# Patient Record
Sex: Female | Born: 1955
Health system: Southern US, Community
[De-identification: ages and names within clinical notes are randomized; demographics above are authoritative.]

## PROBLEM LIST (undated history)

## (undated) DIAGNOSIS — I219 Acute myocardial infarction, unspecified: Secondary | ICD-10-CM

## (undated) DIAGNOSIS — F419 Anxiety disorder, unspecified: Secondary | ICD-10-CM

## (undated) DIAGNOSIS — K219 Gastro-esophageal reflux disease without esophagitis: Secondary | ICD-10-CM

## (undated) DIAGNOSIS — J189 Pneumonia, unspecified organism: Secondary | ICD-10-CM

## (undated) DIAGNOSIS — K760 Fatty (change of) liver, not elsewhere classified: Secondary | ICD-10-CM

## (undated) DIAGNOSIS — E039 Hypothyroidism, unspecified: Secondary | ICD-10-CM

## (undated) DIAGNOSIS — I2510 Atherosclerotic heart disease of native coronary artery without angina pectoris: Secondary | ICD-10-CM

## (undated) DIAGNOSIS — G4730 Sleep apnea, unspecified: Secondary | ICD-10-CM

## (undated) DIAGNOSIS — G8928 Other chronic postprocedural pain: Secondary | ICD-10-CM

## (undated) DIAGNOSIS — R0600 Dyspnea, unspecified: Secondary | ICD-10-CM

## (undated) DIAGNOSIS — I10 Essential (primary) hypertension: Secondary | ICD-10-CM

## (undated) DIAGNOSIS — M1990 Unspecified osteoarthritis, unspecified site: Secondary | ICD-10-CM

## (undated) DIAGNOSIS — I639 Cerebral infarction, unspecified: Secondary | ICD-10-CM

## (undated) DIAGNOSIS — J45909 Unspecified asthma, uncomplicated: Secondary | ICD-10-CM

## (undated) DIAGNOSIS — Z87442 Personal history of urinary calculi: Secondary | ICD-10-CM

## (undated) DIAGNOSIS — I6529 Occlusion and stenosis of unspecified carotid artery: Secondary | ICD-10-CM

## (undated) DIAGNOSIS — I739 Peripheral vascular disease, unspecified: Secondary | ICD-10-CM

## (undated) DIAGNOSIS — R51 Headache: Secondary | ICD-10-CM

## (undated) DIAGNOSIS — R0602 Shortness of breath: Secondary | ICD-10-CM

## (undated) DIAGNOSIS — E785 Hyperlipidemia, unspecified: Secondary | ICD-10-CM

## (undated) DIAGNOSIS — R9431 Abnormal electrocardiogram [ECG] [EKG]: Secondary | ICD-10-CM

## (undated) DIAGNOSIS — C3490 Malignant neoplasm of unspecified part of unspecified bronchus or lung: Secondary | ICD-10-CM

## (undated) HISTORY — DX: Fatty (change of) liver, not elsewhere classified: K76.0

## (undated) HISTORY — PX: TUBAL LIGATION: SHX77

## (undated) HISTORY — DX: Hyperlipidemia, unspecified: E78.5

## (undated) HISTORY — PX: ABDOMINAL SURGERY: SHX537

## (undated) HISTORY — PX: FRACTURE SURGERY: SHX138

## (undated) HISTORY — DX: Gastro-esophageal reflux disease without esophagitis: K21.9

## (undated) HISTORY — PX: TONSILLECTOMY: SUR1361

## (undated) HISTORY — DX: Cerebral infarction, unspecified: I63.9

## (undated) HISTORY — PX: OTHER SURGICAL HISTORY: SHX169

## (undated) HISTORY — DX: Abnormal electrocardiogram (ECG) (EKG): R94.31

## (undated) HISTORY — DX: Unspecified asthma, uncomplicated: J45.909

## (undated) HISTORY — DX: Peripheral vascular disease, unspecified: I73.9

## (undated) HISTORY — PX: KNEE ARTHROSCOPY: SUR90

## (undated) HISTORY — DX: Occlusion and stenosis of unspecified carotid artery: I65.29

## (undated) HISTORY — PX: APPENDECTOMY: SHX54

## (undated) HISTORY — DX: Atherosclerotic heart disease of native coronary artery without angina pectoris: I25.10

---

## 1997-02-05 HISTORY — PX: ABDOMINAL HYSTERECTOMY: SHX81

## 1998-02-05 HISTORY — PX: ABDOMINAL HYSTERECTOMY: SHX81

## 2002-09-02 ENCOUNTER — Emergency Department (HOSPITAL_COMMUNITY): Admission: EM | Admit: 2002-09-02 | Discharge: 2002-09-02 | Payer: Self-pay

## 2002-12-07 ENCOUNTER — Inpatient Hospital Stay (HOSPITAL_COMMUNITY): Admission: EM | Admit: 2002-12-07 | Discharge: 2002-12-12 | Payer: Self-pay | Admitting: Emergency Medicine

## 2002-12-14 ENCOUNTER — Encounter: Admission: RE | Admit: 2002-12-14 | Discharge: 2002-12-14 | Payer: Self-pay | Admitting: Internal Medicine

## 2002-12-17 ENCOUNTER — Encounter: Admission: RE | Admit: 2002-12-17 | Discharge: 2003-03-17 | Payer: Self-pay | Admitting: Internal Medicine

## 2002-12-24 ENCOUNTER — Ambulatory Visit (HOSPITAL_COMMUNITY): Admission: RE | Admit: 2002-12-24 | Discharge: 2002-12-24 | Payer: Self-pay | Admitting: Orthopedic Surgery

## 2002-12-24 ENCOUNTER — Ambulatory Visit (HOSPITAL_BASED_OUTPATIENT_CLINIC_OR_DEPARTMENT_OTHER): Admission: RE | Admit: 2002-12-24 | Discharge: 2002-12-24 | Payer: Self-pay | Admitting: Orthopedic Surgery

## 2002-12-30 ENCOUNTER — Ambulatory Visit (HOSPITAL_COMMUNITY): Admission: RE | Admit: 2002-12-30 | Discharge: 2002-12-30 | Payer: Self-pay | Admitting: *Deleted

## 2003-05-26 ENCOUNTER — Emergency Department (HOSPITAL_COMMUNITY): Admission: EM | Admit: 2003-05-26 | Discharge: 2003-05-27 | Payer: Self-pay | Admitting: Emergency Medicine

## 2003-06-05 ENCOUNTER — Emergency Department (HOSPITAL_COMMUNITY): Admission: EM | Admit: 2003-06-05 | Discharge: 2003-06-05 | Payer: Self-pay | Admitting: *Deleted

## 2003-10-30 ENCOUNTER — Emergency Department (HOSPITAL_COMMUNITY): Admission: EM | Admit: 2003-10-30 | Discharge: 2003-10-30 | Payer: Self-pay | Admitting: Emergency Medicine

## 2003-12-15 ENCOUNTER — Emergency Department (HOSPITAL_COMMUNITY): Admission: EM | Admit: 2003-12-15 | Discharge: 2003-12-16 | Payer: Self-pay | Admitting: Emergency Medicine

## 2004-01-03 ENCOUNTER — Ambulatory Visit: Payer: Self-pay | Admitting: Physical Medicine & Rehabilitation

## 2004-01-03 ENCOUNTER — Inpatient Hospital Stay (HOSPITAL_COMMUNITY): Admission: AC | Admit: 2004-01-03 | Discharge: 2004-01-10 | Payer: Self-pay

## 2004-03-23 ENCOUNTER — Encounter: Admission: RE | Admit: 2004-03-23 | Discharge: 2004-03-23 | Payer: Self-pay | Admitting: Family Medicine

## 2004-06-21 ENCOUNTER — Ambulatory Visit (HOSPITAL_COMMUNITY): Admission: RE | Admit: 2004-06-21 | Discharge: 2004-06-21 | Payer: Self-pay | Admitting: Internal Medicine

## 2004-08-22 ENCOUNTER — Encounter: Admission: RE | Admit: 2004-08-22 | Discharge: 2004-08-22 | Payer: Self-pay | Admitting: Internal Medicine

## 2004-11-06 ENCOUNTER — Ambulatory Visit (HOSPITAL_BASED_OUTPATIENT_CLINIC_OR_DEPARTMENT_OTHER): Admission: RE | Admit: 2004-11-06 | Discharge: 2004-11-06 | Payer: Self-pay | Admitting: Ophthalmology

## 2004-11-06 ENCOUNTER — Encounter (INDEPENDENT_AMBULATORY_CARE_PROVIDER_SITE_OTHER): Payer: Self-pay | Admitting: Specialist

## 2005-04-24 ENCOUNTER — Encounter: Admission: RE | Admit: 2005-04-24 | Discharge: 2005-04-24 | Payer: Self-pay | Admitting: Internal Medicine

## 2005-10-02 ENCOUNTER — Emergency Department (HOSPITAL_COMMUNITY): Admission: EM | Admit: 2005-10-02 | Discharge: 2005-10-02 | Payer: Self-pay | Admitting: Family Medicine

## 2005-12-01 ENCOUNTER — Emergency Department (HOSPITAL_COMMUNITY): Admission: EM | Admit: 2005-12-01 | Discharge: 2005-12-01 | Payer: Self-pay | Admitting: Family Medicine

## 2006-02-05 DIAGNOSIS — G8928 Other chronic postprocedural pain: Secondary | ICD-10-CM

## 2006-02-05 HISTORY — DX: Other chronic postprocedural pain: G89.28

## 2006-02-05 HISTORY — PX: HERNIA REPAIR: SHX51

## 2006-03-27 ENCOUNTER — Encounter: Admission: RE | Admit: 2006-03-27 | Discharge: 2006-03-27 | Payer: Self-pay | Admitting: Internal Medicine

## 2007-06-02 ENCOUNTER — Ambulatory Visit (HOSPITAL_COMMUNITY): Admission: RE | Admit: 2007-06-02 | Discharge: 2007-06-02 | Payer: Self-pay | Admitting: Gastroenterology

## 2007-06-25 ENCOUNTER — Encounter: Admission: RE | Admit: 2007-06-25 | Discharge: 2007-06-25 | Payer: Self-pay | Admitting: Internal Medicine

## 2008-01-13 ENCOUNTER — Encounter: Admission: RE | Admit: 2008-01-13 | Discharge: 2008-02-05 | Payer: Self-pay | Admitting: Internal Medicine

## 2008-02-03 ENCOUNTER — Inpatient Hospital Stay (HOSPITAL_COMMUNITY): Admission: EM | Admit: 2008-02-03 | Discharge: 2008-02-10 | Payer: Self-pay | Admitting: Emergency Medicine

## 2008-04-23 ENCOUNTER — Encounter: Admission: RE | Admit: 2008-04-23 | Discharge: 2008-04-23 | Payer: Self-pay | Admitting: Surgery

## 2008-06-18 ENCOUNTER — Emergency Department (HOSPITAL_COMMUNITY): Admission: EM | Admit: 2008-06-18 | Discharge: 2008-06-18 | Payer: Self-pay | Admitting: Family Medicine

## 2009-02-19 ENCOUNTER — Emergency Department (HOSPITAL_COMMUNITY): Admission: EM | Admit: 2009-02-19 | Discharge: 2009-02-20 | Payer: Self-pay | Admitting: Emergency Medicine

## 2009-03-30 ENCOUNTER — Encounter: Admission: RE | Admit: 2009-03-30 | Discharge: 2009-03-30 | Payer: Self-pay | Admitting: Internal Medicine

## 2009-04-03 ENCOUNTER — Inpatient Hospital Stay (HOSPITAL_COMMUNITY): Admission: EM | Admit: 2009-04-03 | Discharge: 2009-04-06 | Payer: Self-pay | Admitting: Emergency Medicine

## 2009-09-07 ENCOUNTER — Encounter: Admission: RE | Admit: 2009-09-07 | Discharge: 2009-09-07 | Payer: Self-pay | Admitting: Orthopedic Surgery

## 2009-10-27 ENCOUNTER — Emergency Department (HOSPITAL_COMMUNITY): Admission: EM | Admit: 2009-10-27 | Discharge: 2009-10-28 | Payer: Self-pay | Admitting: Emergency Medicine

## 2009-11-07 ENCOUNTER — Emergency Department (HOSPITAL_COMMUNITY): Admission: EM | Admit: 2009-11-07 | Discharge: 2009-11-07 | Payer: Self-pay | Admitting: Emergency Medicine

## 2010-01-03 ENCOUNTER — Encounter: Admission: RE | Admit: 2010-01-03 | Discharge: 2010-01-03 | Payer: Self-pay | Admitting: Family Medicine

## 2010-02-26 ENCOUNTER — Encounter: Payer: Self-pay | Admitting: Internal Medicine

## 2010-03-18 ENCOUNTER — Encounter (HOSPITAL_COMMUNITY): Payer: Self-pay | Admitting: Radiology

## 2010-03-18 ENCOUNTER — Inpatient Hospital Stay (HOSPITAL_COMMUNITY)
Admission: EM | Admit: 2010-03-18 | Discharge: 2010-03-24 | DRG: 390 | Disposition: A | Discharge status: Home / Self Care | Payer: Managed Care, Other (non HMO) | Attending: Internal Medicine | Admitting: Internal Medicine

## 2010-03-18 ENCOUNTER — Emergency Department (HOSPITAL_COMMUNITY): Payer: Managed Care, Other (non HMO)

## 2010-03-18 DIAGNOSIS — E785 Hyperlipidemia, unspecified: Secondary | ICD-10-CM | POA: Diagnosis present

## 2010-03-18 DIAGNOSIS — I10 Essential (primary) hypertension: Secondary | ICD-10-CM | POA: Diagnosis present

## 2010-03-18 DIAGNOSIS — Z951 Presence of aortocoronary bypass graft: Secondary | ICD-10-CM

## 2010-03-18 DIAGNOSIS — Z79899 Other long term (current) drug therapy: Secondary | ICD-10-CM

## 2010-03-18 DIAGNOSIS — E119 Type 2 diabetes mellitus without complications: Secondary | ICD-10-CM | POA: Diagnosis present

## 2010-03-18 DIAGNOSIS — K56609 Unspecified intestinal obstruction, unspecified as to partial versus complete obstruction: Principal | ICD-10-CM | POA: Diagnosis present

## 2010-03-18 DIAGNOSIS — E039 Hypothyroidism, unspecified: Secondary | ICD-10-CM | POA: Diagnosis present

## 2010-03-18 DIAGNOSIS — I2510 Atherosclerotic heart disease of native coronary artery without angina pectoris: Secondary | ICD-10-CM | POA: Diagnosis present

## 2010-03-18 HISTORY — DX: Essential (primary) hypertension: I10

## 2010-03-18 LAB — CBC
Hemoglobin: 12.9 g/dL (ref 12.0–15.0)
MCHC: 33.6 g/dL (ref 30.0–36.0)
RDW: 13.7 % (ref 11.5–15.5)
WBC: 9.5 10*3/uL (ref 4.0–10.5)

## 2010-03-18 LAB — DIFFERENTIAL
Basophils Absolute: 0 10*3/uL (ref 0.0–0.1)
Basophils Relative: 0 % (ref 0–1)
Lymphocytes Relative: 26 % (ref 12–46)
Monocytes Absolute: 0.6 10*3/uL (ref 0.1–1.0)
Neutro Abs: 6.3 10*3/uL (ref 1.7–7.7)

## 2010-03-18 LAB — COMPREHENSIVE METABOLIC PANEL
ALT: 28 U/L (ref 0–35)
AST: 31 U/L (ref 0–37)
Alkaline Phosphatase: 86 U/L (ref 39–117)
CO2: 23 mEq/L (ref 19–32)
Calcium: 9.6 mg/dL (ref 8.4–10.5)
GFR calc Af Amer: 51 mL/min — ABNORMAL LOW (ref >60)
Potassium: 4 mEq/L (ref 3.5–5.1)
Sodium: 137 mEq/L (ref 135–145)
Total Protein: 7.6 g/dL (ref 6.0–8.3)

## 2010-03-18 LAB — URINALYSIS, ROUTINE W REFLEX MICROSCOPIC
Bilirubin Urine: NEGATIVE
Hgb urine dipstick: NEGATIVE
Specific Gravity, Urine: 1.035 — ABNORMAL HIGH (ref 1.005–1.030)
pH: 5.5 (ref 5.0–8.0)

## 2010-03-18 LAB — LACTIC ACID, PLASMA: Lactic Acid, Venous: 0.8 mmol/L (ref 0.5–2.2)

## 2010-03-18 LAB — URIC ACID: Uric Acid, Serum: 8.4 mg/dL — ABNORMAL HIGH (ref 2.4–7.0)

## 2010-03-18 LAB — URINE MICROSCOPIC-ADD ON

## 2010-03-18 LAB — GLUCOSE, CAPILLARY: Glucose-Capillary: 193 mg/dL — ABNORMAL HIGH (ref 70–99)

## 2010-03-18 MED ORDER — IOHEXOL 300 MG/ML  SOLN
120.0000 mL | Freq: Once | INTRAMUSCULAR | Status: AC | PRN
  Administered 2010-03-18: 120 mL via INTRAVENOUS

## 2010-03-19 ENCOUNTER — Inpatient Hospital Stay (HOSPITAL_COMMUNITY): Payer: Managed Care, Other (non HMO)

## 2010-03-19 LAB — CBC
HCT: 38.7 % (ref 36.0–46.0)
Hemoglobin: 12.9 g/dL (ref 12.0–15.0)
MCV: 80.8 fL (ref 78.0–100.0)
Platelets: 236 10*3/uL (ref 150–400)
RBC: 4.79 MIL/uL (ref 3.87–5.11)
WBC: 5.2 10*3/uL (ref 4.0–10.5)

## 2010-03-19 LAB — BASIC METABOLIC PANEL
BUN: 10 mg/dL (ref 6–23)
Chloride: 105 mEq/L (ref 96–112)
Potassium: 3.7 mEq/L (ref 3.5–5.1)

## 2010-03-19 LAB — GLUCOSE, CAPILLARY
Glucose-Capillary: 119 mg/dL — ABNORMAL HIGH (ref 70–99)
Glucose-Capillary: 128 mg/dL — ABNORMAL HIGH (ref 70–99)
Glucose-Capillary: 147 mg/dL — ABNORMAL HIGH (ref 70–99)

## 2010-03-19 LAB — DIFFERENTIAL
Eosinophils Absolute: 0.1 10*3/uL (ref 0.0–0.7)
Lymphocytes Relative: 40 % (ref 12–46)
Lymphs Abs: 2.1 10*3/uL (ref 0.7–4.0)
Neutro Abs: 2.7 10*3/uL (ref 1.7–7.7)
Neutrophils Relative %: 52 % (ref 43–77)

## 2010-03-19 LAB — MAGNESIUM: Magnesium: 2.4 mg/dL (ref 1.5–2.5)

## 2010-03-20 LAB — GLUCOSE, CAPILLARY: Glucose-Capillary: 96 mg/dL (ref 70–99)

## 2010-03-20 LAB — CBC
HCT: 37.4 % (ref 36.0–46.0)
Hemoglobin: 12.8 g/dL (ref 12.0–15.0)
MCH: 27.7 pg (ref 26.0–34.0)
MCHC: 34.2 g/dL (ref 30.0–36.0)
MCV: 81 fL (ref 78.0–100.0)
RBC: 4.62 MIL/uL (ref 3.87–5.11)

## 2010-03-20 NOTE — Consult Note (Signed)
NAMEJACQUELYNNE, [name]                ACCOUNT NO.:  [id number]  MEDICAL RECORD NO.:  [id number]           PATIENT TYPE:  I  LOCATION:  5118                         FACILITY:  [facility]  PHYSICIAN:  Juanetta Gosling, MDDATE OF BIRTH:  [birth year]  DATE OF CONSULTATION:  03/18/2010 DATE OF DISCHARGE:                                CONSULTATION   CHIEF COMPLAINT:  Abdominal pain.  HISTORY OF PRESENT ILLNESS:  This is a 55 year old female who has had a history of small bowel obstructions and began having crampy intermittent abdominal pain that was diffuse last night.  She actually had some gas and flatus and bowel movement yesterday.  Then early this morning, this pain began to be diffuse and constant associated with significant nausea and an episode of emesis, prompting her to come to the emergency room. She denies any flatus or bowel movement since early this morning and she states her abdomen does feel bloated. She denies any fevers.  There is no real relieving factors or aggravating factors currently.  She does state she does have a history of a colonoscopy in the recent past which she states it was normal.  PAST MEDICAL HISTORY: 1. Diabetes. 2. Hypertension. 3. Hypercholesterolemia. 4. History of small bowel obstruction. 5. History of cardiac catheterization.  PAST SURGICAL HISTORY:  Two C-sections in 1999.  She reports she had a hysterectomy in 2000.  It sounds like she had a BSO.  She also has a laparoscopic ventral hernia repair for an incarcerated hernia by Dr. Estelle Grumbles in 2009 with bright Parietex mesh.  MEDICATIONS:  Crestor, lisinopril, atenolol and metformin.  ALLERGIES:  PENICILLIN and MORPHINE.  SOCIAL HISTORY:  She is a nonsmoker, does not drink alcohol.  She works as a Psychologist, occupational, Engineer, site.  PHYSICAL EXAMINATION:  VITAL SIGNS:  98.5, 162/78, 79 and 16. GENERAL:  She is a well-appearing female who was in some moderate distress due to abdominal  pain. NECK:  Supple without adenopathy. HEART:  Regular rate and rhythm. LUNGS:  Clear bilaterally. ABDOMEN:  Diffuse mild tenderness and no peritoneal signs.  She has no bowel sounds really that are present and some well-healed scars.  She has no inguinal hernias present.  Laboratory evaluation shows a white blood cell count 9.5, hematocrit 38.4, platelets 249, BUN 33, creatinine 1.31, lipase 35.  Liver function tests are within normal limits.  Urinalysis shows some small leuks, but otherwise negative.  Her CT scan shows her to have long segment of abnormal ileum, therefore, some mild wall thickening and mid bowel dilatation with small bowel feces sign, bladder gastric distention.  ASSESSMENT:  Small bowel obstruction.  PLAN:  This is most likely adhesive in nature, although I am little bit concerned about her exam and some of the findings on her CT scan.  She does not really have any risk factors for ischemic bowel.  She has had bowel obstructions in the past.  I think this is the most likely etiology.  She does not currently meet any criteria to go to the operating room.  She is mildly tender throughout her abdomen and she is not tachycardic.  Her white count is normal right now.  I think it would be reasonable.  I discussed with the patient a course of conservative therapy to see if we can get her better to avoid an operation.  She and I discussed the G-tube n.p.o. and monitoring.  I did discuss with her if she does not get better or worsens, then she is going to need to go to the operating room.     Juanetta Gosling, MD     MCW/MEDQ  D:  03/18/2010  T:  03/19/2010  Job:  [id number]  Electronically Signed by Emelia Loron MD on 03/20/2010 07:52:16 PM

## 2010-03-21 LAB — GLUCOSE, CAPILLARY
Glucose-Capillary: 118 mg/dL — ABNORMAL HIGH (ref 70–99)
Glucose-Capillary: 151 mg/dL — ABNORMAL HIGH (ref 70–99)
Glucose-Capillary: 92 mg/dL (ref 70–99)
Glucose-Capillary: 92 mg/dL (ref 70–99)

## 2010-03-21 LAB — BASIC METABOLIC PANEL
CO2: 23 mEq/L (ref 19–32)
Calcium: 9.1 mg/dL (ref 8.4–10.5)
Glucose, Bld: 85 mg/dL (ref 70–99)
Sodium: 140 mEq/L (ref 135–145)

## 2010-03-21 NOTE — H&P (Signed)
NAMELATORSHA, CURLING                ACCOUNT NO.:  [id number]  MEDICAL RECORD NO.:  [id number]           PATIENT TYPE:  I  LOCATION:  5118                         FACILITY:  [facility]  PHYSICIAN:  Tarry Kos, MD       DATE OF BIRTH:  07-18-1955  DATE OF ADMISSION:  03/18/2010 DATE OF DISCHARGE:                             HISTORY & PHYSICAL   CHIEF COMPLAINT:  Nausea, vomiting, and abdominal pain.  HISTORY OF PRESENT ILLNESS:  Ms. Dirusso is a 55 year old female with a history of diabetes, hypertension, previous bowel obstructions, and status post lysis of adhesions in March 2011 who presents to the emergency room after suddenly having acute onset of nausea, vomiting, and abdominal pain yesterday.  She says she was washing clothes at which point she all of a sudden started having nausea and vomiting last night and diffuse generalized abdominal pain.  It is persisted and she came to the emergency room early in the morning and was found to have small bowel obstruction.  This has happened to her once before and she actually has had lysis of adhesions in the beginning of 2011, and she has had a ventral hernia repair in 2009.  She has been admitted for partial bowel obstruction.  She denies any fevers.  She currently is comfortable in the emergency room.  She has not had any vomiting since about 6 hours ago.  She said she did have a bowel movement last night. Her of vomit is greenish bilious in nature.  There is no blood.  She denies any dysuria or fevers.  PAST MEDICAL HISTORY: 1. Hypertension. 2. Diabetes. 3. History of coronary artery disease, status post stenting in 2000. 4. Previous history of bowel obstruction. 5. Status post hysterectomy. 6. Status post lysis of adhesions. 7. Status post ventral hernia repair.  SOCIAL HISTORY:  Does not smoke, does not do any alcohol or any other drug use.  FAMILY HISTORY:  Noncontributory.  ALLERGIES:  PENICILLIN causes  swelling.  MEDICATIONS: 1. Lisinopril 40 mg a day. 2. Doxepin 6 mg nightly. 3. Sertraline 50 mg daily. 4. Ambien 10 mg nightly. 5. Atenolol and chlorthalidone 100/25 daily. 6. Metformin 500 mg twice a day. 7. Crestor 20 mg a day. 8. Byetta 10 mcg injection twice a day. 9. Tramadol 50 mg every 6 hours as needed for pain. 10.Levothyroxine 50 mcg daily.  PHYSICAL EXAMINATION:  VITAL SIGNS:  Afebrile.  Vital signs are stable. GENERAL:  Alert and oriented x4, no apparent distress, cooperates fairly. HEART:  Regular rate and rhythm without murmurs or gallops. CHEST:  Clear to auscultation bilaterally.  No wheezes or rhonchi heard. ABDOMEN:  Soft.  It is tender diffusely to palpation.  Nondistended. Positive bowel sounds.  No rebound, no guarding, nonacute abdomen. EXTREMITIES:  No clubbing, cyanosis, or edema. PSYCH:  Normal affect. NEURO:  No focal neurologic deficits.  LABORATORY DATA:  Her white count is normal.  Hemoglobin is normal.  Her BUN and creatinine is 33 and 1.3.  Electrolytes are normal.  LFTs normal.  Alk phos normal.  Lipase normal.  Urinalysis:  Small amount of leukocytes, many  bacteria, 7-10 white blood cells.  Lactic acid level is 0.8.  CT of her abdomen and pelvis consistent with small bowel dilation. Terminal ileum with circumstantial mild wall thickening, which may represent changes of inflammatory bowel disease, infection, or ischemia. Her acute abdominal series was negative.  ASSESSMENT AND PLAN:  This is a 55 year old female with: 1. Partial small bowel obstruction.  We will try conservative     management at this point.  Place NG-tube if she has any further     vomiting.  Surgery has been consulted.  She is a nonsurgical     abdomen at this point.  Monitor closely.  Provide Zofran and pain     medications as needed. 2. Diabetes.  Cover with sliding scale insulin while she is here. 3. Possible urinary tract infection.  Cover with Rocephin.  Urine      culture is pending. 4. The patient is full code.  Further recommendations pending over     hospital course.          ______________________________ Tarry Kos, MD     RD/MEDQ  D:  03/18/2010  T:  03/19/2010  Job:  [id number]  Electronically Signed by Eldridge Dace MD on 03/21/2010 01:37:17 PM

## 2010-03-22 LAB — CBC
HCT: 35.9 % — ABNORMAL LOW (ref 36.0–46.0)
Hemoglobin: 12.4 g/dL (ref 12.0–15.0)
MCH: 27.6 pg (ref 26.0–34.0)
MCHC: 34.5 g/dL (ref 30.0–36.0)

## 2010-03-22 LAB — BASIC METABOLIC PANEL
BUN: 5 mg/dL — ABNORMAL LOW (ref 6–23)
CO2: 23 mEq/L (ref 19–32)
Calcium: 8.8 mg/dL (ref 8.4–10.5)
Creatinine, Ser: 0.72 mg/dL (ref 0.4–1.2)
Glucose, Bld: 147 mg/dL — ABNORMAL HIGH (ref 70–99)

## 2010-03-22 LAB — GLUCOSE, CAPILLARY

## 2010-03-23 LAB — GLUCOSE, CAPILLARY
Glucose-Capillary: 115 mg/dL — ABNORMAL HIGH (ref 70–99)
Glucose-Capillary: 149 mg/dL — ABNORMAL HIGH (ref 70–99)
Glucose-Capillary: 157 mg/dL — ABNORMAL HIGH (ref 70–99)
Glucose-Capillary: 152 mg/dL — ABNORMAL HIGH (ref 70–99)

## 2010-03-23 LAB — MAGNESIUM: Magnesium: 2 mg/dL (ref 1.5–2.5)

## 2010-03-23 LAB — BASIC METABOLIC PANEL
Calcium: 8.4 mg/dL (ref 8.4–10.5)
Creatinine, Ser: 0.79 mg/dL (ref 0.4–1.2)
GFR calc Af Amer: >60 mL/min (ref >60)

## 2010-03-24 LAB — GLUCOSE, CAPILLARY: Glucose-Capillary: 152 mg/dL — ABNORMAL HIGH (ref 70–99)

## 2010-04-04 NOTE — Discharge Summary (Signed)
[name], [name]                ACCOUNT NO.:  [id number]  MEDICAL RECORD NO.:  [id number]           PATIENT TYPE:  LOCATION:                                 FACILITY:  PHYSICIAN:  Erick Blinks, MD     DATE OF BIRTH:  20-Oct-1955  DATE OF ADMISSION: DATE OF DISCHARGE:                              DISCHARGE SUMMARY   PRIMARY CARE PHYSICIAN:  Emeterio Reeve, MD.  DISCHARGE DIAGNOSES: 1. Small-bowel obstruction, resolved. 2. Chronic abdominal pain. 3. Type 2 diabetes. 4. Hypertension. 5. Obesity. 6. Hypothyroidism. 7. Hyperlipidemia. 8. History of coronary artery disease.  DISCHARGE MEDICATIONS: 1. Atenolol 100 mg p.o. daily. 2. MiraLax 17 grams by mouth daily. 3. Oxycodone/APAP 5/325 mg 1-2 tablets by mouth every 6 hours as     needed. 4. Ambien 10 mg 1 tablet by mouth daily at bedtime. 5. Byetta 10 mcg 1 injection subcutaneously twice daily. 6. Crestor 20 mg 1 tablet by mouth daily. 7. Doxepin 6 mg 1 capsule by mouth daily at bedtime. 8. Levothyroxine 50 mcg 1 tablet by mouth daily. 9. Lisinopril 40 mg 1 tablet by mouth daily. 10.Metformin 500 mg 1 tablet by mouth twice daily. 11.Sertraline 50 mg 1 tablet by mouth daily. 12.Tramadol 50 mg 1 tablet by mouth every 6 hours needed.  MEDICATIONS STOPPED IN THE HOSPITAL:  Atenolol/chlorthalidone.  ADMISSION HISTORY:  This is a 55 year old female with history of diabetes, hypertension, and previous bowel obstructions, who presents to the emergency room with complaints of acute onset of nausea, vomiting, and abdominal pain.  The patient was washing clothes when she started to have sudden onset of nausea and vomiting and diffuse generalized abdominal pain.  In the ER, she was found to have small bowel obstruction, was subsequently admitted for further evaluation.  For further details, please refer to the history and physical dictated by Dr. Onalee Hua on March 19, 2010.  HOSPITAL COURSE: 1. Small bowel obstruction.  The  patient was followed in consultation     by [facility], initially placed with an NG tube.  The     patient was treated with conservative measures.  Her progress was     slow, but she has improved.  Her bowel obstruction has since     resolved.  She is tolerating her p.o. diet.  Has not had any nausea     and vomiting, and is having bowel movements.  The patient is     ambulating.  She does have chronic abdominal pain, but this is     unchanged for the past few years.  She has seen Dr. Elnoria Howard in the     past and has been advised to follow up with him for further     management. 2. Hypertension.  She is continued on her outpatient medication. 3. Diabetes.  The patient was continued on her outpatient medications     and it can be further managed by the primary care physician.  DIAGNOSTIC IMAGING: 1. Abdominal x-ray on February 11 shows no evidence of acute     abnormality, unremarkable bowel gas pattern.  No evidence of acute  cardiopulmonary disease. 2. CT abdomen and pelvis on February 11 shows long segment of abnormal    ileum included the terminal ileum with circumferential mild wall     thickening, which may represent changes of the inflammatory bowel     disease, infection, or ischemia.  Segmental mid small bowel     dilatation with small-bowel feces sign is an indicator of     obstruction at the level of an anastomotic staple line.  Moderate     gastric distention and unremarkable colon. 3. X-ray of the abdomen on February 12 showed air-fluid levels and     bowel loops which are at the upper limits of normal size, no intra-     abdominal free air. Continued to follow up with plain films of the     abdomen as recommended to ensure resolution.  No acute     cardiopulmonary disease.  CONSULTATIONS:  [facility].  PROCEDURES:  None.  DISCHARGE INSTRUCTIONS:  The patient is to continue on a heart-healthy low-calorie diet, conduct her activity as  tolerated.  She will follow up with Dr. Paulino Rily next week and she should follow up with Dr. Elnoria Howard as needed in the future for continued abdominal pain.  The patient is otherwise stable for discharge.  CONDITION AT TIME OF DISCHARGE:  Improved.  TIME SPENT ON THIS DISCHARGE:  50 minutes.     Erick Blinks, MD     JM/MEDQ  D:  03/24/2010  T:  03/25/2010  Job:  [id number]  cc:   Emeterio Reeve, MD  Electronically Signed by Erick Blinks  on 04/04/2010 05:17:50 PM

## 2010-04-05 ENCOUNTER — Inpatient Hospital Stay (HOSPITAL_COMMUNITY)
Admission: EM | Admit: 2010-04-05 | Discharge: 2010-04-10 | DRG: 389 | Disposition: A | Discharge status: Home / Self Care | Payer: Managed Care, Other (non HMO) | Attending: Internal Medicine | Admitting: Internal Medicine

## 2010-04-05 ENCOUNTER — Emergency Department (HOSPITAL_COMMUNITY): Payer: Managed Care, Other (non HMO)

## 2010-04-05 DIAGNOSIS — E785 Hyperlipidemia, unspecified: Secondary | ICD-10-CM | POA: Diagnosis present

## 2010-04-05 DIAGNOSIS — R1013 Epigastric pain: Secondary | ICD-10-CM | POA: Diagnosis present

## 2010-04-05 DIAGNOSIS — K56609 Unspecified intestinal obstruction, unspecified as to partial versus complete obstruction: Principal | ICD-10-CM | POA: Diagnosis present

## 2010-04-05 DIAGNOSIS — E876 Hypokalemia: Secondary | ICD-10-CM | POA: Diagnosis present

## 2010-04-05 DIAGNOSIS — IMO0001 Reserved for inherently not codable concepts without codable children: Secondary | ICD-10-CM | POA: Diagnosis present

## 2010-04-05 DIAGNOSIS — I10 Essential (primary) hypertension: Secondary | ICD-10-CM | POA: Diagnosis present

## 2010-04-05 DIAGNOSIS — E039 Hypothyroidism, unspecified: Secondary | ICD-10-CM | POA: Diagnosis present

## 2010-04-05 DIAGNOSIS — N390 Urinary tract infection, site not specified: Secondary | ICD-10-CM | POA: Diagnosis present

## 2010-04-05 DIAGNOSIS — I2510 Atherosclerotic heart disease of native coronary artery without angina pectoris: Secondary | ICD-10-CM | POA: Diagnosis present

## 2010-04-05 DIAGNOSIS — K3189 Other diseases of stomach and duodenum: Secondary | ICD-10-CM | POA: Diagnosis present

## 2010-04-05 LAB — CBC
HCT: 39.8 % (ref 36.0–46.0)
MCHC: 34.2 g/dL (ref 30.0–36.0)
MCV: 81.9 fL (ref 78.0–100.0)
Platelets: 360 10*3/uL (ref 150–400)
RDW: 14.8 % (ref 11.5–15.5)

## 2010-04-05 LAB — COMPREHENSIVE METABOLIC PANEL
Albumin: 3.7 g/dL (ref 3.5–5.2)
BUN: 17 mg/dL (ref 6–23)
Calcium: 9.5 mg/dL (ref 8.4–10.5)
Glucose, Bld: 181 mg/dL — ABNORMAL HIGH (ref 70–99)
Total Protein: 7.4 g/dL (ref 6.0–8.3)

## 2010-04-05 LAB — DIFFERENTIAL
Basophils Absolute: 0 10*3/uL (ref 0.0–0.1)
Eosinophils Absolute: 0.2 10*3/uL (ref 0.0–0.7)
Eosinophils Relative: 2 % (ref 0–5)
Lymphocytes Relative: 42 % (ref 12–46)
Lymphs Abs: 4 10*3/uL (ref 0.7–4.0)
Monocytes Absolute: 0.5 10*3/uL (ref 0.1–1.0)

## 2010-04-05 LAB — POCT CARDIAC MARKERS
CKMB, poc: <1 ng/mL — ABNORMAL LOW (ref 1.0–8.0)
Myoglobin, poc: 45.1 ng/mL (ref 12–200)
Troponin i, poc: <0.05 ng/mL (ref 0.00–0.09)

## 2010-04-05 LAB — LIPASE, BLOOD: Lipase: 26 U/L (ref 11–59)

## 2010-04-06 LAB — URINE CULTURE
Colony Count: 80000
Culture  Setup Time: 201202290121

## 2010-04-06 LAB — URINALYSIS, ROUTINE W REFLEX MICROSCOPIC
Bilirubin Urine: NEGATIVE
Ketones, ur: NEGATIVE mg/dL
Nitrite: NEGATIVE
Protein, ur: NEGATIVE mg/dL
Urobilinogen, UA: 0.2 mg/dL (ref 0.0–1.0)

## 2010-04-06 LAB — URINE MICROSCOPIC-ADD ON

## 2010-04-06 LAB — COMPREHENSIVE METABOLIC PANEL
ALT: 20 U/L (ref 0–35)
AST: 23 U/L (ref 0–37)
CO2: 21 mEq/L (ref 19–32)
Chloride: 107 mEq/L (ref 96–112)
Creatinine, Ser: 0.76 mg/dL (ref 0.4–1.2)
GFR calc Af Amer: >60 mL/min (ref >60)
GFR calc non Af Amer: >60 mL/min (ref >60)
Total Bilirubin: 0.7 mg/dL (ref 0.3–1.2)

## 2010-04-06 LAB — GLUCOSE, CAPILLARY

## 2010-04-06 LAB — CBC
HCT: 39.4 % (ref 36.0–46.0)
Hemoglobin: 12.9 g/dL (ref 12.0–15.0)
MCH: 26.9 pg (ref 26.0–34.0)
MCHC: 32.7 g/dL (ref 30.0–36.0)
MCV: 82.1 fL (ref 78.0–100.0)

## 2010-04-06 MED ORDER — IOHEXOL 300 MG/ML  SOLN
100.0000 mL | Freq: Once | INTRAMUSCULAR | Status: AC | PRN
  Administered 2010-04-06: 100 mL via INTRAVENOUS

## 2010-04-07 ENCOUNTER — Inpatient Hospital Stay (HOSPITAL_COMMUNITY): Payer: Managed Care, Other (non HMO)

## 2010-04-07 LAB — CBC
MCH: 27.2 pg (ref 26.0–34.0)
MCHC: 33.2 g/dL (ref 30.0–36.0)
MCV: 82.1 fL (ref 78.0–100.0)
Platelets: 299 10*3/uL (ref 150–400)
RDW: 15 % (ref 11.5–15.5)

## 2010-04-07 LAB — BASIC METABOLIC PANEL
BUN: 7 mg/dL (ref 6–23)
Calcium: 8.3 mg/dL — ABNORMAL LOW (ref 8.4–10.5)
GFR calc non Af Amer: >60 mL/min (ref >60)
Glucose, Bld: 160 mg/dL — ABNORMAL HIGH (ref 70–99)

## 2010-04-07 LAB — GLUCOSE, CAPILLARY
Glucose-Capillary: 138 mg/dL — ABNORMAL HIGH (ref 70–99)
Glucose-Capillary: 219 mg/dL — ABNORMAL HIGH (ref 70–99)

## 2010-04-08 LAB — CBC
HCT: 37.8 % (ref 36.0–46.0)
Hemoglobin: 12.5 g/dL (ref 12.0–15.0)
MCH: 27.1 pg (ref 26.0–34.0)
MCHC: 33.1 g/dL (ref 30.0–36.0)

## 2010-04-08 LAB — GLUCOSE, CAPILLARY: Glucose-Capillary: 144 mg/dL — ABNORMAL HIGH (ref 70–99)

## 2010-04-08 LAB — BASIC METABOLIC PANEL
CO2: 26 mEq/L (ref 19–32)
Calcium: 8.8 mg/dL (ref 8.4–10.5)
Creatinine, Ser: 0.68 mg/dL (ref 0.4–1.2)
Glucose, Bld: 178 mg/dL — ABNORMAL HIGH (ref 70–99)

## 2010-04-09 LAB — BASIC METABOLIC PANEL
Calcium: 9.2 mg/dL (ref 8.4–10.5)
Chloride: 109 mEq/L (ref 96–112)
Creatinine, Ser: 0.77 mg/dL (ref 0.4–1.2)
GFR calc Af Amer: >60 mL/min (ref >60)
GFR calc non Af Amer: >60 mL/min (ref >60)

## 2010-04-09 LAB — GLUCOSE, CAPILLARY
Glucose-Capillary: 189 mg/dL — ABNORMAL HIGH (ref 70–99)
Glucose-Capillary: 164 mg/dL — ABNORMAL HIGH (ref 70–99)

## 2010-04-09 LAB — CBC
MCH: 26.9 pg (ref 26.0–34.0)
Platelets: 330 10*3/uL (ref 150–400)
RBC: 4.68 MIL/uL (ref 3.87–5.11)
RDW: 14.7 % (ref 11.5–15.5)

## 2010-04-10 ENCOUNTER — Inpatient Hospital Stay (HOSPITAL_COMMUNITY): Payer: Managed Care, Other (non HMO)

## 2010-04-10 LAB — GLUCOSE, CAPILLARY
Glucose-Capillary: 163 mg/dL — ABNORMAL HIGH (ref 70–99)
Glucose-Capillary: 234 mg/dL — ABNORMAL HIGH (ref 70–99)

## 2010-04-10 LAB — BASIC METABOLIC PANEL
CO2: 24 mEq/L (ref 19–32)
Chloride: 104 mEq/L (ref 96–112)
GFR calc Af Amer: >60 mL/min (ref >60)
Glucose, Bld: 155 mg/dL — ABNORMAL HIGH (ref 70–99)
Potassium: 3.7 mEq/L (ref 3.5–5.1)
Sodium: 137 mEq/L (ref 135–145)

## 2010-04-10 LAB — TSH: TSH: 3.56 u[IU]/mL (ref 0.350–4.500)

## 2010-04-17 NOTE — Consult Note (Addendum)
[name], HAJDUK                ACCOUNT NO.:  [id number]  MEDICAL RECORD NO.:  [id number]           PATIENT TYPE:  I  LOCATION:  3729                         FACILITY:  [facility]  PHYSICIAN:  Lennie Muckle, MD      DATE OF BIRTH:  [birth year]  DATE OF CONSULTATION:  04/06/2010 DATE OF DISCHARGE:                                CONSULTATION   REASON FOR CONSULTATION:  Small bowel obstruction.  HISTORY:  [name] is a 55 year old female who apparently was just recently in the hospital approximately 2 weeks ago for a bowel obstruction.  It was recommended at that time that the patient undergo the surgery.  She was concerned to undergo surgery since she has had a previous lysis of adhesions and hernia repair approximately 2 years ago by Dr. Karie Soda.  She declined surgery, got better with an NG tube; however, came back in yesterday for abdominal pain distention similar to her previous episode.  She states she has had crampy abdominal pain and increasing abdominal distention over the past 12 hours.  She has had nausea, but did not have emesis until she came to the emergency department.  She describes having a bowel movement yesterday.  Cannot remember the last time she passed flatus.  She states that her pain is crescendo-decrescendo at present episodes.  She feels better when she has pain medicines.  NG tube was placed in the emergency department at approximately a liter of brown-tinged fluid.  CT scan revealed fecalization of the small bowel anastomosis reflecting possible dysmotility.  There was a small amount of fluid within the pelvis. White count normal at 9.6.  CMP reveals a BUN and creatinine of 17 and 0.86.  PAST MEDICAL HISTORY: 1. Diabetes. 2. Hypertension. 3. Hypercholesterolemia. 4. Chronic back pain.  SURGICAL HISTORY:  Two C-sections, hysterectomy, small bowel resection with ventral hernia repair and laceration greater than 2 hours in 2009 by Dr.  Michaell Cowing.  FAMILY HISTORY:  Coronary artery disease, cancer.  SOCIAL HISTORY:  No tobacco or alcohol use.  She does work as a Brewing technologist.  MEDICATIONS:  Tramadol, sertraline, MiraLax, metformin, lisinopril, Synthroid, doxepin, Crestor, Byetta, atenolol, and Ambien.  ALLERGIES:  PENICILLIN and MORPHINE.  REVIEW OF SYSTEMS:  Negative other than the HPI.  PHYSICAL EXAMINATION:  GENERAL:  She is a well-developed, well-nourished female who appears uncomfortable.  She is lying on a stretcher. VITAL SIGNS:  Temperature is 97.9, blood pressure 178/95, heart rate 61, O2 sats 100% on room air. HEENT:  Head is normocephalic.  Extraocular muscles are intact.  Pupils are equal and round.  Sclerae and conjunctivae are clear.  NG tube is coursing through the right naris.  The left naris is clear without drainage.  Mucosa is moist and pink. NECK:  No tenderness.  Normal range of motion.  Thyroid without abnormalities.  Trachea is midline. LYMPHATIC:  No lymphadenopathy along the superficial or cervical chain, submental, posterior cervical, supraclavicular area. CHEST:  Clear to auscultation bilaterally.  Normal expansion and excursion.  No fremitus. CARDIOVASCULAR:  Regular rate and rhythm.  No murmurs, gallops, or rubs. No edema in  the lower extremities.  Pulses are adequate in the upper extremities. ABDOMEN:  Protuberant and distended.  Bowel sounds are diminished.  She has had some tenderness to palpation.  No organomegaly and no masses are appreciated. MUSCULOSKELETAL:  No pain with palpation.  No deformities.  Normal range of motion. SKIN:  No rashes, no jaundice.  Skin is warm to touch. NEUROLOGIC:  Within normal limits.  CT scan is reviewed.  There is some dilated small bowel loops.  There appeared to have stool coming up into the small bowel.  Stool within the colon.  Small amount of fluid within the pelvis.  ASSESSMENT/PLAN:  Questionable dysmotility fecalization versus  small bowel obstruction from adhesions.  At the present time, I do not think she is in acute need for surgery.  She might benefit from an enema.  We would continue NG tube for conservative management for the time being. We will continue to follow for any acute changes, questionable benefit from repeat surgery.     Lennie Muckle, MD     ALA/MEDQ  D:  04/06/2010  T:  04/06/2010  Job:  [id number]  cc:   Emeterio Reeve, MD  Electronically Signed by Bertram Savin MD on 04/17/2010 01:27:34 PM

## 2010-04-19 LAB — GLUCOSE, CAPILLARY: Glucose-Capillary: 163 mg/dL — ABNORMAL HIGH (ref 70–99)

## 2010-04-23 LAB — COMPREHENSIVE METABOLIC PANEL
AST: 33 U/L (ref 0–37)
BUN: 15 mg/dL (ref 6–23)
CO2: 24 mEq/L (ref 19–32)
Calcium: 9.4 mg/dL (ref 8.4–10.5)
Chloride: 101 mEq/L (ref 96–112)
Creatinine, Ser: 0.89 mg/dL (ref 0.4–1.2)
GFR calc Af Amer: >60 mL/min (ref >60)
GFR calc non Af Amer: >60 mL/min (ref >60)
Total Bilirubin: 0.8 mg/dL (ref 0.3–1.2)

## 2010-04-23 LAB — CBC
HCT: 40.7 % (ref 36.0–46.0)
Hemoglobin: 13.7 g/dL (ref 12.0–15.0)
MCHC: 33.6 g/dL (ref 30.0–36.0)
RBC: 4.86 MIL/uL (ref 3.87–5.11)
RDW: 14.2 % (ref 11.5–15.5)

## 2010-04-23 LAB — DIFFERENTIAL
Basophils Absolute: 0 10*3/uL (ref 0.0–0.1)
Eosinophils Relative: 5 % (ref 0–5)
Lymphocytes Relative: 48 % — ABNORMAL HIGH (ref 12–46)
Lymphs Abs: 3.1 10*3/uL (ref 0.7–4.0)
Neutro Abs: 2.6 10*3/uL (ref 1.7–7.7)

## 2010-04-23 LAB — URINALYSIS, ROUTINE W REFLEX MICROSCOPIC
Bilirubin Urine: NEGATIVE
Hgb urine dipstick: NEGATIVE
Ketones, ur: NEGATIVE mg/dL
Nitrite: POSITIVE — AB
Urobilinogen, UA: 1 mg/dL (ref 0.0–1.0)

## 2010-04-23 LAB — LIPASE, BLOOD: Lipase: 27 U/L (ref 11–59)

## 2010-04-26 LAB — CBC
HCT: 35.9 % — ABNORMAL LOW (ref 36.0–46.0)
HCT: 42.3 % (ref 36.0–46.0)
Hemoglobin: 14.3 g/dL (ref 12.0–15.0)
MCV: 84.7 fL (ref 78.0–100.0)
Platelets: 242 10*3/uL (ref 150–400)
RBC: 5.04 MIL/uL (ref 3.87–5.11)
RDW: 13.7 % (ref 11.5–15.5)
RDW: 13.9 % (ref 11.5–15.5)
WBC: 7.8 10*3/uL (ref 4.0–10.5)

## 2010-04-26 LAB — GLUCOSE, CAPILLARY
Glucose-Capillary: 117 mg/dL — ABNORMAL HIGH (ref 70–99)
Glucose-Capillary: 119 mg/dL — ABNORMAL HIGH (ref 70–99)
Glucose-Capillary: 121 mg/dL — ABNORMAL HIGH (ref 70–99)
Glucose-Capillary: 142 mg/dL — ABNORMAL HIGH (ref 70–99)
Glucose-Capillary: 148 mg/dL — ABNORMAL HIGH (ref 70–99)
Glucose-Capillary: 217 mg/dL — ABNORMAL HIGH (ref 70–99)

## 2010-04-26 LAB — DIFFERENTIAL
Basophils Absolute: 0 10*3/uL (ref 0.0–0.1)
Eosinophils Relative: 0 % (ref 0–5)
Lymphocytes Relative: 19 % (ref 12–46)
Lymphs Abs: 1.5 10*3/uL (ref 0.7–4.0)
Monocytes Absolute: 0.2 10*3/uL (ref 0.1–1.0)
Monocytes Relative: 3 % (ref 3–12)
Neutro Abs: 6 10*3/uL (ref 1.7–7.7)

## 2010-04-26 LAB — POCT I-STAT, CHEM 8
Calcium, Ion: 1.01 mmol/L — ABNORMAL LOW (ref 1.12–1.32)
Creatinine, Ser: 1.2 mg/dL (ref 0.4–1.2)
Glucose, Bld: 144 mg/dL — ABNORMAL HIGH (ref 70–99)
Hemoglobin: 15.3 g/dL — ABNORMAL HIGH (ref 12.0–15.0)
Potassium: 3.7 mEq/L (ref 3.5–5.1)

## 2010-04-26 LAB — HEPATIC FUNCTION PANEL
ALT: 30 U/L (ref 0–35)
AST: 33 U/L (ref 0–37)
Alkaline Phosphatase: 103 U/L (ref 39–117)
Bilirubin, Direct: 0.1 mg/dL (ref 0.0–0.3)
Indirect Bilirubin: 0.9 mg/dL (ref 0.3–0.9)

## 2010-04-26 LAB — BASIC METABOLIC PANEL
BUN: 6 mg/dL (ref 6–23)
Chloride: 105 mEq/L (ref 96–112)
Creatinine, Ser: 0.91 mg/dL (ref 0.4–1.2)
Glucose, Bld: 162 mg/dL — ABNORMAL HIGH (ref 70–99)
Potassium: 3.4 mEq/L — ABNORMAL LOW (ref 3.5–5.1)

## 2010-04-30 LAB — GLUCOSE, CAPILLARY
Glucose-Capillary: 123 mg/dL — ABNORMAL HIGH (ref 70–99)
Glucose-Capillary: 123 mg/dL — ABNORMAL HIGH (ref 70–99)
Glucose-Capillary: 124 mg/dL — ABNORMAL HIGH (ref 70–99)
Glucose-Capillary: 125 mg/dL — ABNORMAL HIGH (ref 70–99)
Glucose-Capillary: 128 mg/dL — ABNORMAL HIGH (ref 70–99)
Glucose-Capillary: 133 mg/dL — ABNORMAL HIGH (ref 70–99)
Glucose-Capillary: 151 mg/dL — ABNORMAL HIGH (ref 70–99)

## 2010-05-08 NOTE — H&P (Signed)
[name], [name]                ACCOUNT NO.:  [id number]  MEDICAL RECORD NO.:  [id number]           PATIENT TYPE:  E  LOCATION:  [facility]                         FACILITY:  [facility]  PHYSICIAN:  Eduard Clos, MDDATE OF BIRTH:  1955/06/29  DATE OF ADMISSION:  04/05/2010 DATE OF DISCHARGE:                             HISTORY & PHYSICAL   PRIMARY CARE PHYSICIAN:  Jasmine December A. Paulino Rily, MD  CHIEF COMPLAINT:  Abdominal pain, nausea, vomiting.  HISTORY OF PRESENT ILLNESS:  A 55 year old female who was just recently discharged from hospital on March 25, 2010, after being treated for small bowel obstruction, presented with complaints of sudden onset of abdominal pain with nausea, vomiting.  In the ER, the patient had a CAT scan, which was showing features of a small bowel fraction.  At this time, the patient has been admitted for the same.  The patient stated that symptoms started last evening around 3 o'clock, had abdominal pain, which was diffuse, crampy, along with it the patient had multiple episodes of nausea and vomiting.  She did not have any bowel movements.  The patient has had similar symptoms just last month and also had previously been admitted for the same also had surgeries for the same.  The patient denies any chest pain, shortness of breath.  Denies any dizziness, loss of consciousness, any focal deficit.  Denies any fever or chills or palpitation.  In addition, found to be having very high blood pressure.  PAST MEDICAL HISTORY:  History of CAD.  The patient states she did not have any stents placed, history of hypertension, diabetes mellitus type 2, hypothyroidism, hyperlipidemia.  PAST SURGICAL HISTORY:  Adhesiolysis, hysterectomy, hernia repair.  MEDICATIONS ON ADMISSION: 1. Tramadol 50 mg p.o. q.6 p.r.n. 2. Sertraline 50 mg daily. 3. Oxycodone/acetaminophen q.6 p.r.n. 4. MiraLax. 5. Metformin 500 mg 1 tablet twice daily. 6. Lisinopril 40 mg daily. 7.  Levothyroxine 50 mcg p.o. daily. 8. Doxepin 60 mg p.o. daily at bedtime. 9. Crestor 20 mg daily. 10.Byetta injection 10 mcg injection daily. 11.Atenolol 100 mg daily. 12.Ambien 10 mg daily.  ALLERGIES:  MORPHINE and PENICILLIN.  FAMILY HISTORY:  Positive for diabetes, hypertension, and coronary artery disease.  SOCIAL HISTORY:  Does not smoke cigarettes, drinks alcohol occasionally. Denies any drug abuse.  REVIEW OF SYSTEMS:  As per history of present illness, nothing else significant.  PHYSICAL EXAMINATION:  GENERAL:  The patient examined at bedside, not in acute distress. VITAL SIGNS:  Blood pressure is 178/100, temperature is 97.9, respirations 18, O2 sat is 100%. HEENT:  Anicteric.  No pallor.  No discharge from ears, eyes, nose, or mouth. CHEST:  Bilateral air entry present.  No rhonchi, no crepitation. HEART:  S1 and S2 heard. ABDOMEN:  Soft.  Diffuse tenderness present all over the abdomen.  Bowel sounds not present.  No guarding or rigidity. CNS: Alert, awake, and oriented to time, place, and person.  Moves upper and lower extremities 5/5. EXTREMITIES:  Peripheral pulses felt.  No edema.  LABORATORY DATA:  CT abdomen and pelvis with contrast shows similar finding to the prior study, large bulk of stool.  The patient's likely ileocolic, anastomosis with partial fecalization of the distal ileum and mild distention of the distal small bowel loops associated mesenteric stranding and trace fluid.  This likely reflects recurrent partial obstruction due to small bleeding at the patient's anastomosis, small amount of free fluid above the inferior tip for the liver, scattered calcifications along the abdominal aorta and its branches, grade 1 anterolisthesis of L5 on S1 secondary to chronic bilateral pars defect with associated vacuum phenomenon, mild bibasilar atelectasis noted. CBC, WBC 9.6, hemoglobin 13.6, hematocrit 39.8, platelets 360.  Complete metabolic panel, sodium  139, potassium 3.9, chloride 107, carbon dioxide 24, glucose 181, BUN 17, creatinine 0.8, total bilirubin is 0.6, alkaline phos is 88, AST 26, ALT 21, total protein 7.4, albumin 3.7, calcium 9.5, lipase 26, CK-MB less than 1, troponin less than 0.05, myoglobin is 45.1.  UA is negative for nitrites, small amount of leukocytes, urine crystals, calcium oxalate, wbc's 3-6.  ASSESSMENT: 1. Small bowel obstruction. 2. Accelerated hypertension. 3. History of diabetes mellitus type 2. 4. History of cancer. 5. History of hyperlipidemia. 6. History of hypothyroidism.  PLAN: 1. At this time, we will admit the patient 2. For her small bowel obstruction, at this time, the patient is     placed on NG tube suction, which we will continue.  We will place     the patient on antiemetics and p.r.n. pain medications.  Surgery,     Dr. Freida Busman has been already consulted by ER physician for further     recommendation 3. For accelerated hypertension, at this time, I am placing the     patient on p.r.n. labetalol.  If blood pressure becomes very     uncontrolled, then may have to consider infusion of     antihypertensive. 4. Diabetes mellitus type 2.  At this time, we will cover with     sensitive sliding scale. 5. The patient is a full code. 6. Further recommendation as condition evolves.     Eduard Clos, MD     ANK/MEDQ  D:  04/06/2010  T:  04/06/2010  Job:  [id number]  cc:   Emeterio Reeve, MD  Electronically Signed by Midge Minium MD on 05/08/2010 07:26:47 AM

## 2010-05-22 LAB — CBC
HCT: 30.7 % — ABNORMAL LOW (ref 36.0–46.0)
Hemoglobin: 10.8 g/dL — ABNORMAL LOW (ref 12.0–15.0)
Platelets: 265 10*3/uL (ref 150–400)
Platelets: 286 10*3/uL (ref 150–400)
RDW: 13 % (ref 11.5–15.5)
RDW: 13.4 % (ref 11.5–15.5)
WBC: 3.7 10*3/uL — ABNORMAL LOW (ref 4.0–10.5)

## 2010-05-22 LAB — BASIC METABOLIC PANEL
BUN: 4 mg/dL — ABNORMAL LOW (ref 6–23)
Calcium: 8.2 mg/dL — ABNORMAL LOW (ref 8.4–10.5)
Creatinine, Ser: 0.76 mg/dL (ref 0.4–1.2)
GFR calc non Af Amer: >60 mL/min (ref >60)
Glucose, Bld: 82 mg/dL (ref 70–99)
Potassium: 3.2 mEq/L — ABNORMAL LOW (ref 3.5–5.1)

## 2010-05-22 LAB — URINALYSIS, ROUTINE W REFLEX MICROSCOPIC
Ketones, ur: 15 mg/dL — AB
Nitrite: NEGATIVE
pH: 6.5 (ref 5.0–8.0)

## 2010-05-22 LAB — GLUCOSE, CAPILLARY
Glucose-Capillary: 104 mg/dL — ABNORMAL HIGH (ref 70–99)
Glucose-Capillary: 122 mg/dL — ABNORMAL HIGH (ref 70–99)
Glucose-Capillary: 126 mg/dL — ABNORMAL HIGH (ref 70–99)
Glucose-Capillary: 143 mg/dL — ABNORMAL HIGH (ref 70–99)
Glucose-Capillary: 148 mg/dL — ABNORMAL HIGH (ref 70–99)
Glucose-Capillary: 165 mg/dL — ABNORMAL HIGH (ref 70–99)
Glucose-Capillary: 192 mg/dL — ABNORMAL HIGH (ref 70–99)
Glucose-Capillary: 70 mg/dL (ref 70–99)
Glucose-Capillary: 73 mg/dL (ref 70–99)
Glucose-Capillary: 78 mg/dL (ref 70–99)
Glucose-Capillary: 83 mg/dL (ref 70–99)
Glucose-Capillary: 96 mg/dL (ref 70–99)
Glucose-Capillary: 99 mg/dL (ref 70–99)

## 2010-05-22 LAB — URINE CULTURE: Colony Count: >=100000

## 2010-06-02 ENCOUNTER — Other Ambulatory Visit: Payer: Self-pay | Admitting: *Deleted

## 2010-06-02 NOTE — Telephone Encounter (Signed)
Received refill request from [facility]. [name] is not a pt in our clinic.   Refill request  returned to pharmacy

## 2010-06-20 NOTE — H&P (Signed)
NAMETYRENA, GOHR                ACCOUNT NO.:  [id number]   MEDICAL RECORD NO.:  [id number]          PATIENT TYPE:  INP   LOCATION:  1524                         FACILITY:  [facility]   PHYSICIAN:  Ardeth Sportsman, MD     DATE OF BIRTH:  [birth year]   DATE OF ADMISSION:  02/03/2008  DATE OF DISCHARGE:                              HISTORY & PHYSICAL   PRIMARY CARE PHYSICIAN:  Fleet Contras, M.D.   REQUESTING PHYSICIAN:  Markham Jordan L. Effie Shy, M.D.   SURGEON:  Ardeth Sportsman, M.D.   REASON FOR CONSULTATION/ADMISSION:  Small bowel obstruction.   HISTORY OF PRESENT ILLNESS:  Ms. Trick is a 55 year old obese diabetic  with distant cardiac disease, diabetes, and hypertension, who has had  intermittent abdominal pain for the past few years.  Normally, the  abdominal pains are mild, but she noted over the past week, she has had  persistent intermittent pain.  They intensified and became more constant  last night.  She came to the emergency room, and workup is concerning  for bowel obstruction based on CAT scan.  The patient denies any sick  contacts or travel history.  She normally has a bowel movement twice  daily.  She has not had flatus for more than 24 hours.  No one else has  been sick around her.   She denies any history of inflammatory bowel disease or irritable bowel  syndrome.  She had a colonoscopy done by Jeani Hawking earlier this year  that she recalls being normal.   PAST MEDICAL HISTORY:  1. Hypertension.  2. Diabetes.  3. Question of coronary disease, status post stenting when she lived      in [place] back in 2000.  She is not on any blood thinners since      that time.   PAST SURGICAL HISTORY:  1. C-sections x2.  2. Partial hysterectomy.  3. Completion hysterectomy with incidental appendectomy.  4. Arthroscopy of the knee with repair of a lateral meniscus.  5. Femur fracture repair.  6. Colonoscopy in 2009 by Dr. Jeani Hawking, that according to the      patient was  within normal limits.   ALLERGIES:  1. MORPHINE.  2. PENICILLIN.   MEDICATIONS:  1. Ambien p.r.n.  2. Glimepiride 4 mg daily.  3. Lantus subcu 15 units nightly.   SOCIAL HISTORY:  She occasionally has some alcohol.  No tobacco or other  drug use.  She is employed.  She lives with family, and she has 4 family  members present.   FAMILY HISTORY:  Negative for any inflammatory bowel disease or any  other GI issues.  However, there is a family history of diabetes and  hypertension, coronary artery disease in the family.   REVIEW OF SYSTEMS:  As noted per HPI.  GENERAL:  No fever, chills,  sweats.  No weight gain or weight loss.  Ophthalmologic, ENT,  psychiatric, musculoskeletal, neurologic is otherwise negative.  CARDIAC:  She tells me she can walk several blocks without any  difficulty and no exertional chest pain or shortness of breath recently.  PULMONARY:  No cold, coughs, or flu.  Hepatic, renal, GYN, and breast  are negative.  ENDOCRINE:  Her blood sugars have been running in the  200s to 300s lately.  She has not adjusted any of her meds, that she can  recall.  No thyroid issues that she can recall.  LYMPH:  Negative.  ALLERGIC:  Negative.  DERMATOLOGIC:  Negative.   PHYSICAL EXAMINATION:  Temperature 97.8, pulse 60s-70s, respirations 14-  18, sats 99% on room air.  Initial blood pressure was 174/110 but after  getting some pain medicines, down to 150/74.  GENERAL:  She is a well-developed, well-nourished, obese female lying in  bed, calm in no acute distress.  PSYCH:  She is pleasant and interactive with no evidence of any  dementia, delirium, psychosis, or paranoia.  HEENT:  Pupils are equal, round and reactive to light.  Extraocular  movements are intact.  Sclerae are anicteric or injected.  NECK:  Supple.  No masses.  Trachea is midline.  HEENT:  Normocephalic.  Mucous membranes are dry.  She has an NG tube in  her left naris with some thinly bilious fluid in the  canister.  HEART:  Regular rate and rhythm.  No murmurs, rubs or gallops.  Normal  radial and dorsalis pedis pulses.  CHEST:  Clear to auscultation bilaterally.  No wheezes, rales, or  rhonchi.  No pain to rib or sternal compression.  ABDOMEN:  Obese but soft.  She has some mild discomfort in her abdomen  diffusely but a little more present in the bilateral lower quadrants but  no focal tender point.  When she coughs, I can feel an infraumbilical  midline hernia pop out that spontaneously reduces.  She has some  discomfort when she coughs.  She has no inguinal hernias.  I did not do  a formal pelvic exam.  RECTAL:  Deferred.  MUSCULOSKELETAL:  Full range of motion of the shoulders, elbows, wrists,  as well as hips, knees, ankles.  SKIN:  No petechiae, no purpura, no obvious sores or lesions.  NEUROLOGIC:  Cranial nerves II-XII are intact.  Hand grips 5/5, equal  and symmetrical.  No resting or intention tremors.   LABORATORY VALUES:  White count is 5.4, hemoglobin 14.2.  Her  electrolytes are within normal range except for a glucose of 247.   CAT scan shows evidence of a transition zone in her proximal pelvis of  her distal jejunum, because that is where the small bowel obstruction.  She does have air in her colon with not a lot of stool.  I think I may  see some diverticula but no evidence of any diverticulitis.  She also  has an infraumbilical ventral hernia with some omentum within it.  No  bowel.  There is no evidence of any inguinal hernias.  Some question of  some fatty change in her liver.  She has some small left renal kidney  stone as well.  She does not have any hydronephrosis on that side at  all.   Urinalysis shows no hemoglobin, many bacteria, but no white cells, no  leukocyte esterases or nitrites.   ASSESSMENT/PLAN:  A 55 year old obese female with nausea, vomiting,  worsening abdominal pain, with CT scan findings consistent with a  partial small bowel  obstruction.  1. Admit.  2. IV fluids.  3. Diabetic control with Lantus and sliding-scale insulin.  4. Blood pressure control with use metoprolol p.r.n. for right now.  5. Nasogastric tube decompression.  The NG tube is not working      correctly in the ED, but I adjusted it, and it seems to be working      better now.  6. Ventral hernia.  No evidence of any significant incarceration in      her strangulation site.  Hold off on any intervention at this time.      Should she go to the OR, I can try to fix it at the same time.  7. If she has worsening abdominal pain or it does not improve over the      next approximately 48 hours, she may benefit from diagnostic      laparoscopy versus laparotomy with probable lysis of adhesions.      The anatomy and physiology of the digestive tract are explained.      The pathophysiology of small bowel obstruction and herniation was      explained.  The technique of surgery was discussed.  Risks,      benefits and alternatives were discussed.  Patient and her family      agree with this plan.      Ardeth Sportsman, MD  Electronically Signed     SCG/MEDQ  D:  02/03/2008  T:  02/04/2008  Job:  [id number]

## 2010-06-20 NOTE — Op Note (Signed)
[name], [name]                ACCOUNT NO.:  [id number]   MEDICAL RECORD NO.:  [id number]          PATIENT TYPE:  INP   LOCATION:  1524                         [facility]ACILITY:  [facility]   PHYSICIAN:  Ardeth Sportsman, MD     DATE OF BIRTH:  08-30-1955   DATE OF PROCEDURE:  DATE OF DISCHARGE:                               OPERATIVE REPORT   PRIMARY CARE PHYSICIAN:  Fleet Contras, M.D.   SURGEON:  Ardeth Sportsman, M.D.   ASSISTANT:  None.   PREOPERATIVE DIAGNOSES:  1. Persistent small bowel obstruction.  2. Ventral hernia, most likely in-surgical, status post Pfannenstiel      incisions.   POSTOPERATIVE DIAGNOSES:  1. Small bowel obstruction secondary to internal hernia and volvulus      for anterior abdominal adhesions.  2. Incisional ventral hernia, 4 x 5 cm, incarcerated with omentum.   PROCEDURE:  1. Laparoscopic lysis of adhesions x2 hours.  2. Ventral hernia with 15 x 20 cm dual-sided mesh      (Parietex/Seprafilm).   ANESTHESIA:  1. General anesthesia.  2. Local anesthetic in a field block, total of 50 ml used.   DRAINS:  None.   ESTIMATED BLOOD LOSS:  Less than 20 ml.   COMPLICATIONS:  None apparent.   INDICATIONS:  [name] is a 55 year old obese female who has had  numerous pelvic surgeries, including 2 C-sections, a partial  hysterectomy, a completion hysterectomy and appendectomy.  She has had  episodes of intermittent abdominal pain for several years but had severe  enough pain to be admitted with CT scan findings and history and  physical concerning for bowel obstruction.  She has had nasogastric  decompression for 48 hours without improvement in symptoms and with  persistent obstipation.  Given her chronic symptomatology, the entire  lack of improvement in 40 hours, recommendation was made for diagnostic  laparoscopy with possible laparotomy for repair to release probable  small bowel obstruction and current ventral hernia repair.  The risks,  benefits and  alternatives were discussed.  Questions answered.  She  agreed to proceed.   PERIOPERATIVE FINDINGS:  She had a large wad of small bowel that was  volvulized, especially around a very dense band going across her pelvic  brim from one small bowel to the pelvic side wall with a spiral  volvulization.  This seemed to be the section of a transition point.  She also had dense interloop adhesions and adhesions from her greater  omentum to her small bowel.   She had extensive greater omental adhesions to anterior abdominal wall  with half of her greater omentum incarcerated in the ventral hernia.  It  did not seem obviously strangulated.   DESCRIPTION OF PROCEDURE:  Informed consent was confirmed, patient  received IV clindamycin and gentamicin, given her PENICILLIN allergy.  She had sequential compression devices active during her entire case.  She underwent general anesthesia without any difficulty.  She had a  Foley catheter sterilely placed.  Her NG tube was up-sized to an 32  French nasogastric tube since she had a very small one  placed in the ER.  She was in supine with both arms tucked on the foot board in place.  Her  abdomen was prepped and draped in a sterile fashion.  A surgical time-  out was performed to confirm the plan.   A 5-0 port was placed in the left upper quadrant with the patient  steeply in reverse Trendelenburg and left side up using optical-entry  technique.  Camera inspection revealed no intra-abdominal injury.  Under  direct visualization, final ports were placed in the subxiphoid region  along the left flank.   Lysis of adhesions, using primarily cold scissors and some blunt  dissection and some occasional focused cautery scissors was used to free  the abdominal adhesions off the anterior abdominal wall.  This was  primarily greater omentum.  The most dense cluster was around her  ventral hernia, that was in the infraumbilical midline region.  Eventually got  that completely reduced down.   Attention was focused on freeing the greater omentum off numerous small  bowel loops.  There were some areas of some dense adhesion I ended up  trimming off, staying close to the omental side.   Next, I started trying to run the small bowel from the ileocecal region  to the ligament of Treitz; however, within a foot proximal to the ileum,  started entering a large cluster of interloop adhesions.  Over  meticulous sharp dissection was done with only cold scissors to  eventually free up these interloop adhesions.  Eventually, I was able to  free up enough to come down into the pelvis and after some  repositioning, I could see a dense band.  Once this was released, I  helped free up about half of her small bowel, and you could see there  was some obvious decompressed small bowel within it.  It seemed almost  like a closed-loop obstruction in a way.  Once I did that, I was able to  run more small bowel more proximally.  I encountered again more dense  interloop adhesions but eventually freed all those off.  Small bowel was  run from the ileocecal valve to the ligament of Treitz.  Meticulous  inspection was made, and there was no evidence of any serosal tears.  Hemostasis was excellent.  Small bowel was run back from the ligament of  Treitz to the terminal ileum and then back the other way just to make  sure I did not miss anything, there was no evidence of injury.  Copious  irrigation was done, and hemostasis was assured.   The ventral wall defect was measured out to be about 4 x 5 cm.  It was  an incision about halfway between the umbilicus and the pelvic brim.  The smallest size available was 15 x 20 cm mesh, and I decided to use  that.  I ended up using 8 stitches around the upper 2/3 border tied  down.  The mesh was rolled in rough-side-in.  I dilated the left flank  port site bluntly and passed the mesh and unrolled it.  It was tacked to  the lateral  and superior aspects using a suture passer to good result.  The inferior third edge went below the pelvic brim.  I used a suture  passer to pass 2 stitches through the middle part of this mesh from the  pelvic brim to help secure the distal third in the middle.  This  provided a total of 10, #1 Ethibond, and #1  Novofil alternating,  stitches for good result.  A tacker was used to help tack the edges off  the mesh circumferentially and some of the middle area as well.  The  fascial stitches were tied down to the tack, which were done with  pressure down to 8 mm and that seemed to provide no tension.  I closed  it rather well.   The greater omentum was allowed to fall only in its normal place, and  the tip of it ended up going down to the pelvis.  Inspection again was  made, and there was no evidence of any bowel injury or bleeding.  Copious irrigation was done with clots removed and clear return at the  end.  Capnoperitoneum was evacuated, and ports removed.  Please note  that the left flank fascial defect was closed using a 0 Vicryl figure-of-  eight stitch using a laparoscopic suture passer under direct  visualization to good result.  Then capnoperitoneum was evacuated and  ports removed.  The skin was closed using a 4-0 Monocryl stitch on the  port sites and then Dermabond on the puncture sites where the fascial  stitch was.   Patient was extubated and sent to the recovery room under sterile  conditions.  Initially, I could not find any family in the waiting room  or up on the floor, and there was no one answering the phone.  But  apparently the daughter just arrived in the waiting room, so I will see  if I can talk to her.      Ardeth Sportsman, MD  Electronically Signed     SCG/MEDQ  D:  02/05/2008  T:  02/05/2008  Job:  [id number]   cc:   Fleet Contras, M.D.  Fax: [phone]
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
## 2010-06-23 NOTE — Discharge Summary (Signed)
[name], [name]                ACCOUNT NO.:  [id number]   MEDICAL RECORD NO.:  [id number]          PATIENT TYPE:  INP   LOCATION:  1524                         FACILITY:  [facility]   PHYSICIAN:  Ardeth Sportsman, MD     DATE OF BIRTH:  06-Mar-1955   DATE OF ADMISSION:  02/03/2008  DATE OF DISCHARGE:  02/10/2008                               DISCHARGE SUMMARY   PRIMARY CARE PHYSICIAN:  Dr. Fleet Contras.   SURGEON:  Karie Soda, M.D.   GASTROENTEROLOGY:  Dr. Jeani Hawking.   FINAL DISCHARGE DIAGNOSES:  1. Small bowel obstruction.  2. Incarcerated ventral hernia.  3. Hypertension.  4. Diabetes, insulin-requiring.  5. Coronary artery disease.  6. Status post partial hysterectomy.  7. Status post completion hysterectomy with incidental appendectomy.  8. C-section x2.  9. Arthroscopy of the knee with repair of lateral meniscus in the      past.  10.Femur fracture repair in the past.  11.Colonoscopy in 2009 by Dr. Jeani Hawking, within normal limits.   PRINCIPAL PROCEDURE:  Diagnostic laparoscopic lysis of adhesions x 2  hours and ventral wall hernia repair with 15 x 20 cm dual-sided mesh on  02/05/2008.   HOSPITAL COURSE:  [name] is a 55 year old obese diabetic with  distant cardiac disease and hypertension who was admitted for probable  bowel obstruction.  She, despite efforts of ambulating and nasogastric  decompression and rehydration, did not improve and therefore was taken  to the operating room on 02/05/2008.  She had adhesions down in her  pelvis for which there was a large band causing an internal herniation  and small bowel internal volvulus.  This was fixed in the OR.   Postoperatively she was hydrated with continued nasogastric  decompression.  The output markedly decreased, and then the tube was  removed on postop day #1.  She started having flatus.  She was started  on liquids.  She was advanced on her diet.  She was transferred over to  oral pain control.  By  postoperative day #5 she was tolerating a diet  with bowel movement and flatus, walking in the hallways with adequate  pain control, and therefore we thought it would be reasonable to  discharge her home with the following instructions:   1. She is to return to the clinic to see me in a few weeks for      adequate followup.  2. She should resume her home medications which include:      a.     Ambien p.r.n.      b.     Lantus 15 units nightly subcutaneously.      c.     Glimepiride 4 mg daily.  3. She should use heating pads and/or ice packs for pain control,      nonsteroidals such as ibuprofen over a brief      period of time for pain control, and oral narcotics for      breakthrough pain.  4. She should call if she has any worsening fevers, chills, sweats,      nausea, vomiting,  abdominal pain, diarrhea, bleeding or other      concerns.      Ardeth Sportsman, MD  Electronically Signed     SCG/MEDQ  D:  03/08/2008  T:  03/08/2008  Job:  [id number]   cc:   Fleet Contras, M.D.  Fax: [phone]   Jordan Hawks. Elnoria Howard, MD  Fax: [phone]
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
## 2010-06-23 NOTE — Discharge Summary (Signed)
NAMESHERRAN, MARGOLIS NO.:  [id number]   MEDICAL RECORD NO.:  [id number]                   PATIENT TYPE:  INP   LOCATION:  5733                                 FACILITY:  [facility]   PHYSICIAN:  Donald Pore, MD                    DATE OF BIRTH:  [birth year]   DATE OF ADMISSION:  12/07/2002  DATE OF DISCHARGE:  12/12/2002                                 DISCHARGE SUMMARY   ATTENDING PHYSICIAN:  Steele Berg Phifer, M.D.   DISCHARGE DIAGNOSES:  1. New-onset diabetes mellitus with ketoacidosis.  2. Urinary tract infection.  3. Status post myocardial infarction.  4. Hyperlipidemia.  5. Hypertension.  6. Gastroesophageal reflux disease.  7. Status post right knee injury at work.  8. Status post total hysterectomy in 2000.  9. Status post cesarean section in 1987.  10.      Status post cesarean section in 1994.  11.      Status post appendectomy in 2000.   DISCHARGE MEDICATIONS:  1. Lantus 40 units subcutaneously q.p.m.  2. Novolog 8 units with meals, 3 units with snack, and sliding scale as     directed.  3. Lipitor 10 mg p.o. q.24 h.  4. Flagyl 500 mg 1 tablet b.i.d. until completed.  5. Protonix 40 mg by mouth q.24 h.  6. Lisinopril 10 mg by mouth q.24 h.   DISCHARGE INSTRUCTIONS:  The patient is to follow up with an ophthalmology  appointment with Dr. Luciana Axe at the [facility] on [address], [address].  The patient is also to follow up with Dr. Chaney Malling  regarding her knee surgery, and the patient is to call the outpatient clinic  for followup with Dr. Donald Pore at [phone] as soon as possible.  She was  discharged on a diabetic diet per instruction.   STUDIES:  1. Bilateral lower extremity Doppler on 12/08/02 showed no evidence of DVT,     SVT, or Baker's cyst bilaterally.  2. AP and lateral chest on 12/07/02 showed mild peribronchial thickening.  AP     and lateral chest on 12/09/02 showed cardiomegaly, question mild fluid   overload, no effusion.   LABORATORY VALUES ON ADMISSION:  CBC:  White blood cell count 6.6,  hemoglobin 18, hematocrit 53, platelet count 299,000. Sodium 131, potassium  3.9, chloride 109, CO2 17, glucose 386, BUN 9, creatinine 1.  UA:  Positive  for small amount of bilirubin, greater than 80 ketones, a large amount of  blood, 100 protein, moderate leukocytes, and a few bacteria.  UA positive  for trichomonads on admission.  LDL 190.   HOSPITAL COURSE:  Problem 1.  The patient was admitted with new-onset  diabetes in DKA.  She was hydrated with normal saline and subsequently half  normal saline with D-5-W.  She was given an insulin bolus of 10 units IV  and  subsequently placed on an insulin drip.  The CBG is to be checked q.1 h.,  Bumex q.2 h.  Electrolytes were monitored throughout, and when the patient  had developed her anion gap, the patient with subcutaneous insulin after  which the insulin drip was stopped, and the patient was maintained on a  subcutaneous insulin regimen.  The patient was provided with a diabetic  counseling and teaching, and she will follow up with the [facility] on an outpatient basis.   Problem 2.  Urinary tract infection.  The patient was treated with Flagyl  p.o.  Urine positive for trichomonads.  The patient was also treated with  three days of ciprofloxacin for positive leukocyte, white blood cells, and  bacteria in her UA.   Problem 3.  Hyperlipidemia.  On admission, the patient was found to have to  an LDL of 190.  She was positive to for diabetes and also had a history of  an MI.  The patient was started on Lipitor 10 mg p.o. q.24 h., and the dose  will be titrated on an outpatient basis.   Problem 4.  Productive cough.  During her hospital stay, the patient had a  productive cough.  Multiple sputum cultures were sent but were inadequate  representation of respiratory sputum.  The patient's symptoms of productive  cough, however, were  relieved when the patient was started on Protonix 40 mg  p.o. daily.  Subsequently, the patient felt that her symptoms were more  likely related to gastroesophageal reflux disease.   Problem 5.  Hypertension.  The patient was found to be mildly hypertensive  during her stay, and she is newly diagnosed with diabetes as well and so it  was thought to be beneficial to start the patient on an ACE inhibitor.  The  patient was started on lisinopril 10 mg p.o. daily.  Her renal function will  be monitored on an outpatient basis.   Problem 6.  Gastroesophageal reflux disease.  The patient was treated with  p.o. Protonix at 40 mg p.o. daily.   The patient was discharged to home with close followup.  Her outpatient  necessary follow-up appointments are as previously mentioned.                                                Donald Pore, MD    HP/MEDQ  D:  12/31/2002  T:  12/31/2002  Job:  [id number]

## 2010-06-23 NOTE — Op Note (Signed)
[name], [name]                            ACCOUNT NO.:  [id number]   MEDICAL RECORD NO.:  [id number]                   PATIENT TYPE:  AMB   LOCATION:  DSC                                  FACILITY:  [facility]   PHYSICIAN:  Rodney A. Chaney Malling, M.D.           DATE OF BIRTH:  1955/12/27   DATE OF PROCEDURE:  12/24/2002  DATE OF DISCHARGE:                                 OPERATIVE REPORT   PREOPERATIVE DIAGNOSIS:  Painful left knee.   POSTOPERATIVE DIAGNOSIS:  Painful left knee, etiology not known.   OPERATION PERFORMED:  Diagnostic arthroscopy and debride anterior fat pad.   SURGEON:  Lenard Galloway. Chaney Malling, M.D.   ANESTHESIA:  General.   PATHOLOGY:  With the arthroscope in the knee, very careful examination of  the entire knee was undertaken.  The patellofemoral junction was visualized  through two portals.  The articular cartilage and posterior aspect of the  patella and the anterior aspect of the femur were absolutely normal.  The  superior pouch was normal.  No synovitis, loose bodies or other  abnormalities were noted.  The arthroscope was then passed into the lateral  gutter.  No pathology was seen.  The lateral compartment was then  visualized.  There was normal articular cartilage over the lateral femoral  condyle and lateral tibial plateau.  The entire circumference of the lateral  meniscus was very carefully visualized on the dorsal and ventral surfaces  and palpated with a probe.  Absolutely no abnormality could be seen.  The  arthroscope was then passed into the intercondylar notch area.  The anterior  cruciate ligament appeared absolutely normal.  This was palpated with a  probe.  No instability, no looseness, no fraying, no other abnormality could  be seen. The arthroscope was then passed into the medial compartment.  There  was some hypertrophic fat pad over the anteromedial compartment.  I am not  sure whether this is pathologic or not.  No plica was seen.  The  medial  recess was normal.  With the arthroscope in the medial compartment, the  articular cartilage in the medial femoral condyle, medial tibial plateau and  the entire circumference.  The medial meniscus was normal.  The medial  meniscus was palpated on the dorsal and ventral surfaces and no tears or  abnormalities were noted.  Essentially this was a normal knee exam.   DESCRIPTION OF PROCEDURE:  The patient was placed on the operating table in  the supine position with a pneumatic tourniquet about the left upper thigh.  The left leg  was placed in a leg holder and the entire left lower extremity  was prepped with DuraPrep and draped out in the usual manner.  An infusion  cannula was placed in the superomedial pouch and the knee was distended with  saline.  The anteromedial and anterolateral portals were made and the  arthroscope was introduced.  The findings  were all described as above.  We  spent a great deal of time looking about the knee.  No significant pathology  was seen.  The fat pat was slightly hypertrophic but this may be physiologic  in her case but this was debrided so better access and visualization could  be obtained.  Again, a great deal of time was spent looking around in the  knee and absolutely no pathology was noted.  Arthroscope was then removed.  Marcaine was placed in the knee.  A large bulky compressive dressing was  applied.  The patient was then transferred to the recovery room in excellent  condition.  Technically this procedure went extremely well.   DRAINS:  None.   COMPLICATIONS:  None.                                               Rodney A. Chaney Malling, M.D.    RAM/MEDQ  D:  12/24/2002  T:  12/25/2002  Job:  [id number]

## 2010-09-09 ENCOUNTER — Emergency Department (HOSPITAL_COMMUNITY)
Admission: EM | Admit: 2010-09-09 | Discharge: 2010-09-09 | Disposition: A | Discharge status: Home / Self Care | Payer: Managed Care, Other (non HMO) | Attending: Emergency Medicine | Admitting: Emergency Medicine

## 2010-09-09 DIAGNOSIS — IMO0002 Reserved for concepts with insufficient information to code with codable children: Secondary | ICD-10-CM | POA: Insufficient documentation

## 2010-09-09 DIAGNOSIS — I10 Essential (primary) hypertension: Secondary | ICD-10-CM | POA: Insufficient documentation

## 2010-09-09 DIAGNOSIS — E119 Type 2 diabetes mellitus without complications: Secondary | ICD-10-CM | POA: Insufficient documentation

## 2010-09-09 DIAGNOSIS — X58XXXA Exposure to other specified factors, initial encounter: Secondary | ICD-10-CM | POA: Insufficient documentation

## 2010-09-09 DIAGNOSIS — M25539 Pain in unspecified wrist: Secondary | ICD-10-CM | POA: Insufficient documentation

## 2010-09-09 DIAGNOSIS — Z794 Long term (current) use of insulin: Secondary | ICD-10-CM | POA: Insufficient documentation

## 2010-09-09 DIAGNOSIS — M25529 Pain in unspecified elbow: Secondary | ICD-10-CM | POA: Insufficient documentation

## 2010-11-10 LAB — COMPREHENSIVE METABOLIC PANEL
Alkaline Phosphatase: 108 U/L (ref 39–117)
BUN: 7 mg/dL (ref 6–23)
CO2: 22 mEq/L (ref 19–32)
Chloride: 102 mEq/L (ref 96–112)
Creatinine, Ser: 0.64 mg/dL (ref 0.4–1.2)
GFR calc non Af Amer: >60 mL/min (ref >60)
Glucose, Bld: 247 mg/dL — ABNORMAL HIGH (ref 70–99)
Total Bilirubin: 0.5 mg/dL (ref 0.3–1.2)

## 2010-11-10 LAB — CBC
HCT: 36.9 % (ref 36.0–46.0)
HCT: 38.6 % (ref 36.0–46.0)
HCT: 42.6 % (ref 36.0–46.0)
Hemoglobin: 14.2 g/dL (ref 12.0–15.0)
MCHC: 33.1 g/dL (ref 30.0–36.0)
MCV: 83.4 fL (ref 78.0–100.0)
MCV: 83.6 fL (ref 78.0–100.0)
Platelets: 259 10*3/uL (ref 150–400)
Platelets: 269 10*3/uL (ref 150–400)
RBC: 5.1 MIL/uL (ref 3.87–5.11)
RDW: 13.5 % (ref 11.5–15.5)
RDW: 13.5 % (ref 11.5–15.5)
WBC: 5.4 10*3/uL (ref 4.0–10.5)
WBC: 6.4 10*3/uL (ref 4.0–10.5)
WBC: 6.9 10*3/uL (ref 4.0–10.5)

## 2010-11-10 LAB — URINALYSIS, ROUTINE W REFLEX MICROSCOPIC
Bilirubin Urine: NEGATIVE
Nitrite: NEGATIVE
Protein, ur: NEGATIVE mg/dL
Specific Gravity, Urine: 1.013 (ref 1.005–1.030)
Urobilinogen, UA: 1 mg/dL (ref 0.0–1.0)

## 2010-11-10 LAB — DIFFERENTIAL
Basophils Absolute: 0 10*3/uL (ref 0.0–0.1)
Basophils Relative: 1 % (ref 0–1)
Lymphocytes Relative: 28 % (ref 12–46)
Neutro Abs: 3.3 10*3/uL (ref 1.7–7.7)

## 2010-11-10 LAB — GLUCOSE, CAPILLARY
Glucose-Capillary: 113 mg/dL — ABNORMAL HIGH (ref 70–99)
Glucose-Capillary: 131 mg/dL — ABNORMAL HIGH (ref 70–99)
Glucose-Capillary: 137 mg/dL — ABNORMAL HIGH (ref 70–99)
Glucose-Capillary: 139 mg/dL — ABNORMAL HIGH (ref 70–99)
Glucose-Capillary: 213 mg/dL — ABNORMAL HIGH (ref 70–99)
Glucose-Capillary: 91 mg/dL (ref 70–99)
Glucose-Capillary: 92 mg/dL (ref 70–99)

## 2010-11-10 LAB — CREATININE, SERUM
GFR calc Af Amer: >60 mL/min (ref >60)
GFR calc Af Amer: >60 mL/min (ref >60)
GFR calc non Af Amer: >60 mL/min (ref >60)

## 2010-11-10 LAB — LIPASE, BLOOD: Lipase: 21 U/L (ref 11–59)

## 2010-11-10 LAB — GASTRIC OCCULT BLOOD (1-CARD TO LAB): Occult Blood, Gastric: POSITIVE — AB

## 2010-11-10 LAB — HEMOGLOBIN A1C: Mean Plasma Glucose: 275 mg/dL

## 2010-11-10 LAB — URINE MICROSCOPIC-ADD ON

## 2010-11-10 LAB — IRON: Iron: 78 ug/dL (ref 42–135)

## 2011-05-06 ENCOUNTER — Emergency Department (HOSPITAL_COMMUNITY)
Admission: EM | Admit: 2011-05-06 | Discharge: 2011-05-07 | Disposition: A | Discharge status: Home / Self Care | Payer: Managed Care, Other (non HMO) | Attending: Emergency Medicine | Admitting: Emergency Medicine

## 2011-05-06 ENCOUNTER — Encounter (HOSPITAL_COMMUNITY): Payer: Self-pay | Admitting: *Deleted

## 2011-05-06 DIAGNOSIS — R509 Fever, unspecified: Secondary | ICD-10-CM | POA: Insufficient documentation

## 2011-05-06 DIAGNOSIS — R109 Unspecified abdominal pain: Secondary | ICD-10-CM | POA: Insufficient documentation

## 2011-05-06 DIAGNOSIS — E119 Type 2 diabetes mellitus without complications: Secondary | ICD-10-CM | POA: Insufficient documentation

## 2011-05-06 DIAGNOSIS — E860 Dehydration: Secondary | ICD-10-CM

## 2011-05-06 DIAGNOSIS — R7989 Other specified abnormal findings of blood chemistry: Secondary | ICD-10-CM | POA: Insufficient documentation

## 2011-05-06 DIAGNOSIS — R197 Diarrhea, unspecified: Secondary | ICD-10-CM

## 2011-05-06 DIAGNOSIS — R10819 Abdominal tenderness, unspecified site: Secondary | ICD-10-CM | POA: Insufficient documentation

## 2011-05-06 DIAGNOSIS — R112 Nausea with vomiting, unspecified: Secondary | ICD-10-CM

## 2011-05-06 DIAGNOSIS — Z794 Long term (current) use of insulin: Secondary | ICD-10-CM | POA: Insufficient documentation

## 2011-05-06 DIAGNOSIS — I10 Essential (primary) hypertension: Secondary | ICD-10-CM | POA: Insufficient documentation

## 2011-05-06 LAB — DIFFERENTIAL
Basophils Absolute: 0 10*3/uL (ref 0.0–0.1)
Eosinophils Absolute: 0.1 10*3/uL (ref 0.0–0.7)
Eosinophils Relative: 1 % (ref 0–5)

## 2011-05-06 LAB — BASIC METABOLIC PANEL
Calcium: 9.5 mg/dL (ref 8.4–10.5)
GFR calc Af Amer: 80 mL/min — ABNORMAL LOW (ref >90)
GFR calc non Af Amer: 69 mL/min — ABNORMAL LOW (ref >90)
Sodium: 134 mEq/L — ABNORMAL LOW (ref 135–145)

## 2011-05-06 LAB — CBC
MCH: 28.4 pg (ref 26.0–34.0)
MCV: 80.5 fL (ref 78.0–100.0)
Platelets: 270 10*3/uL (ref 150–400)
RDW: 13.5 % (ref 11.5–15.5)
WBC: 7 10*3/uL (ref 4.0–10.5)

## 2011-05-06 LAB — URINALYSIS, ROUTINE W REFLEX MICROSCOPIC
Bilirubin Urine: NEGATIVE
Hgb urine dipstick: NEGATIVE
Nitrite: NEGATIVE
Protein, ur: NEGATIVE mg/dL
Urobilinogen, UA: 1 mg/dL (ref 0.0–1.0)

## 2011-05-06 LAB — LIPASE, BLOOD: Lipase: 26 U/L (ref 11–59)

## 2011-05-06 MED ORDER — ONDANSETRON HCL 4 MG/2ML IJ SOLN
4.0000 mg | Freq: Once | INTRAMUSCULAR | Status: AC
  Administered 2011-05-06: 4 mg via INTRAVENOUS
  Filled 2011-05-06: qty 2

## 2011-05-06 MED ORDER — SODIUM CHLORIDE 0.9 % IV BOLUS (SEPSIS)
1000.0000 mL | Freq: Once | INTRAVENOUS | Status: AC
  Administered 2011-05-06: 1000 mL via INTRAVENOUS

## 2011-05-06 MED ORDER — HYDROMORPHONE HCL PF 1 MG/ML IJ SOLN
0.5000 mg | Freq: Once | INTRAMUSCULAR | Status: AC
  Administered 2011-05-06: 0.5 mg via INTRAVENOUS
  Filled 2011-05-06: qty 1

## 2011-05-06 NOTE — ED Notes (Signed)
Patient transported to X-ray 

## 2011-05-06 NOTE — ED Notes (Signed)
The pt has had abd pain with a headache aching all over.  Also has had nv and diarrhea.  She has had since last week

## 2011-05-07 ENCOUNTER — Emergency Department (HOSPITAL_COMMUNITY): Payer: Managed Care, Other (non HMO)

## 2011-05-07 LAB — HEPATIC FUNCTION PANEL
Alkaline Phosphatase: 120 U/L — ABNORMAL HIGH (ref 39–117)
Indirect Bilirubin: 0.5 mg/dL (ref 0.3–0.9)
Total Protein: 8.4 g/dL — ABNORMAL HIGH (ref 6.0–8.3)

## 2011-05-07 MED ORDER — ONDANSETRON 8 MG PO TBDP: 8.0000 mg | ORAL_TABLET | Freq: Three times a day (TID) | ORAL | Status: AC | PRN

## 2011-05-07 MED ORDER — OXYCODONE HCL 5 MG PO TABS: 5.0000 mg | ORAL_TABLET | Freq: Four times a day (QID) | ORAL | Status: AC | PRN

## 2011-05-07 MED ORDER — HYDROMORPHONE HCL PF 1 MG/ML IJ SOLN
1.0000 mg | Freq: Once | INTRAMUSCULAR | Status: AC
  Administered 2011-05-07: 1 mg via INTRAVENOUS
  Filled 2011-05-07: qty 1

## 2011-05-07 MED ORDER — SODIUM CHLORIDE 0.9 % IV SOLN
Freq: Once | INTRAVENOUS | Status: AC
  Administered 2011-05-07: 02:00:00 via INTRAVENOUS

## 2011-05-07 MED ORDER — IBUPROFEN 200 MG PO TABS: 600.0000 mg | ORAL_TABLET | Freq: Once | ORAL | Status: DC

## 2011-05-07 NOTE — ED Notes (Signed)
Abdomen pain for more than two wk's now, started feeling  Nauseated this evening, Gen weakness. Abdomen  pain getting worst, was scared  to take anything because her dr told her that her liver result was bad and she need to be retested in a week. This evening the pain got out of hand. She started having SOB as the pain get worst.

## 2011-05-07 NOTE — ED Notes (Signed)
Returned to room.

## 2011-05-07 NOTE — ED Notes (Signed)
Ambulated to bathroom to void without any problem. steady gait.

## 2011-05-07 NOTE — ED Provider Notes (Signed)
History     CSN: [id number]  Arrival date & time 05/06/11  2157   First MD Initiated Contact with Patient 05/06/11 2315      Chief Complaint  Patient presents with  . multiple symptoms     The history is provided by the patient.   patient reports several days of nausea vomiting diarrhea and decreased oral intake.  She's also had headache and myalgias.  She presents the ER with a fever of 102.1.  She denies chest pain or shortness of breath.  She reports mild upper abdominal cramping.  She denies dysuria or urinary frequency.  She has no melena or hematochezia.  She's had no hematemesis.  Nothing worsens her symptoms.  Nothing improves her symptoms.  Her symptoms are constant.  She has no recent sick contacts.  She denies productive cough or sore throat.  She has no weakness of her upper lower extremities.  She has no photophobia or phonophobia.  She has no recent head trauma.  Past Medical History  Diagnosis Date  . Diabetes mellitus   . Hypertension     History reviewed. No pertinent past surgical history.  No family history on file.  History  Substance Use Topics  . Smoking status: Never Smoker   . Smokeless tobacco: Not on file  . Alcohol Use: No    OB History    Grav Para Term Preterm Abortions TAB SAB Ect Mult Living                  Review of Systems  All other systems reviewed and are negative.    Allergies  Codeine; Morphine and related; and Penicillins  Home Medications   Current Outpatient Rx  Name Route Sig Dispense Refill  . ATENOLOL 100 MG PO TABS Oral Take 100 mg by mouth daily.    . INSULIN GLARGINE 100 UNIT/ML  SOLN Subcutaneous Inject 20-25 Units into the skin 2 (two) times daily. Take 20 units in the morning and 25 units at night    . LISINOPRIL 40 MG PO TABS Oral Take 40 mg by mouth daily.    Marland Kitchen METFORMIN HCL 500 MG PO TABS Oral Take 500 mg by mouth 2 (two) times daily with a meal.    . ROSUVASTATIN CALCIUM 40 MG PO TABS Oral Take 40 mg by  mouth daily.      BP 100/64  Pulse 72  Temp(Src) 102.1 F (38.9 C) (Oral)  Resp 18  SpO2 94%  Physical Exam  Nursing note and vitals reviewed. Constitutional: She is oriented to person, place, and time. She appears well-developed and well-nourished. No distress.       Febrile  HENT:  Head: Normocephalic and atraumatic.  Eyes: EOM are normal.  Neck: Normal range of motion.  Cardiovascular: Normal rate, regular rhythm and normal heart sounds.   Pulmonary/Chest: Effort normal and breath sounds normal.  Abdominal: Soft. She exhibits no distension.       Mild upper abdominal tenderness without guarding or rebound  Musculoskeletal: Normal range of motion.  Neurological: She is alert and oriented to person, place, and time.  Skin: Skin is warm and dry.  Psychiatric: She has a normal mood and affect. Judgment normal.    ED Course  Procedures (including critical care time)  Labs Reviewed  BASIC METABOLIC PANEL - Abnormal; Notable for the following:    Sodium 134 (*)    Glucose, Bld 112 (*)    GFR calc non Af Amer 69 (*)  GFR calc Af Amer 80 (*)    All other components within normal limits  HEPATIC FUNCTION PANEL - Abnormal; Notable for the following:    Total Protein 8.4 (*)    AST 57 (*) HEMOLYSIS AT THIS LEVEL MAY AFFECT RESULT   ALT 47 (*)    Alkaline Phosphatase 120 (*)    All other components within normal limits  URINALYSIS, ROUTINE W REFLEX MICROSCOPIC  CBC  DIFFERENTIAL  LIPASE, BLOOD   US Abdomen Complete  05/07/2011  *RADIOLOGY REPORT*  Clinical Data:  Upper abdominal pain.  Elevated liver function tests.  Post gastric bypass.  COMPLETE ABDOMINAL ULTRASOUND  Comparison:  CT abdomen and pelvis 04/06/2010  Findings:  Gallbladder:  No gallstones, gallbladder wall thickening, or pericholecystic fluid.  Common bile duct:  No bile duct dilatation.  Bile duct diameter measured at 6 mm.  Liver:  No focal lesion identified.  Within normal limits in parenchymal echogenicity.   IVC:  Appears normal.  Pancreas:  No focal abnormality seen.  Spleen:  Spleen length measures 8 cm.  Normal homogeneous parenchymal pattern.  Right Kidney:  Right kidney measures 11.1 cm length.  No hydronephrosis.  Left Kidney:  Left kidney measures 10.8 cm length.  No hydronephrosis.  Abdominal aorta:  No aneurysm identified.  IMPRESSION: Negative abdominal ultrasound.  Original Report Authenticated By: Marlon Pel, M.D.   Dg Abd Acute W/chest  05/07/2011  *RADIOLOGY REPORT*  Clinical Data: Nausea, fever and abdominal pain.  ACUTE ABDOMEN SERIES (ABDOMEN 2 VIEW & CHEST 1 VIEW)  Comparison: Abdominal films 04/10/2010.  Findings: The upright chest x-ray is unremarkable.  Two views of the abdomen demonstrate a nonspecific bowel gas pattern.  There is scattered air in the colon and small bowel but no distention or air fluid levels to suggest obstruction.  No free air.  The soft tissue shadows are maintained.  Extensive surgical changes in the lower abdomen from a prior hernia repair with mesh.  IMPRESSION:  1.  No acute cardiopulmonary findings. 2.  No plain film findings for an acute abdominal process.  Original Report Authenticated By: P. Loralie Champagne, M.D.   I personally reviewed the images  1. Nausea vomiting and diarrhea   2. Dehydration       MDM  This is likely a viral gastroenteritis.  The patient however was having mild upper abdominal tenderness and had a mild elevation in her LFTs as an ultrasound was obtained to evaluate for possible cholelithiasis.  Her abdominal ultrasound and plain films of her abdomen and chest are normal.  The patient feels much better after IV fluids and antiemetics.  She will be discharged home with close followup with her PCP.  I've instructed her to see her doctor in 24-48 hours.  She is nontoxic appearing.  She understands to return the emergency department for worsening symptoms.  All questions were answered        Lyanne Co, MD 05/07/11  [phone]

## 2011-10-01 ENCOUNTER — Other Ambulatory Visit: Payer: Self-pay | Admitting: Internal Medicine

## 2011-10-01 ENCOUNTER — Other Ambulatory Visit: Payer: Self-pay | Admitting: Family Medicine

## 2011-10-01 DIAGNOSIS — Z1231 Encounter for screening mammogram for malignant neoplasm of breast: Secondary | ICD-10-CM

## 2011-10-05 ENCOUNTER — Other Ambulatory Visit: Payer: Self-pay | Admitting: Gastroenterology

## 2011-10-05 DIAGNOSIS — R1032 Left lower quadrant pain: Secondary | ICD-10-CM

## 2011-10-10 ENCOUNTER — Ambulatory Visit
Admission: RE | Admit: 2011-10-10 | Discharge: 2011-10-10 | Disposition: A | Discharge status: Home / Self Care | Payer: Managed Care, Other (non HMO) | Source: Ambulatory Visit | Attending: Gastroenterology | Admitting: Gastroenterology

## 2011-10-10 DIAGNOSIS — R1032 Left lower quadrant pain: Secondary | ICD-10-CM

## 2011-10-10 MED ORDER — IOHEXOL 300 MG/ML  SOLN
100.0000 mL | Freq: Once | INTRAMUSCULAR | Status: AC | PRN
  Administered 2011-10-10: 100 mL via INTRAVENOUS

## 2011-10-23 ENCOUNTER — Ambulatory Visit: Payer: Managed Care, Other (non HMO)

## 2011-11-07 ENCOUNTER — Ambulatory Visit: Payer: Managed Care, Other (non HMO)

## 2011-12-13 ENCOUNTER — Encounter (HOSPITAL_COMMUNITY): Payer: Self-pay | Admitting: Emergency Medicine

## 2011-12-13 ENCOUNTER — Emergency Department (HOSPITAL_COMMUNITY): Payer: Managed Care, Other (non HMO)

## 2011-12-13 ENCOUNTER — Inpatient Hospital Stay (HOSPITAL_COMMUNITY)
Admission: EM | Admit: 2011-12-13 | Discharge: 2011-12-17 | DRG: 194 | Disposition: A | Discharge status: Home / Self Care | Payer: Managed Care, Other (non HMO) | Attending: Internal Medicine | Admitting: Internal Medicine

## 2011-12-13 DIAGNOSIS — R0600 Dyspnea, unspecified: Secondary | ICD-10-CM

## 2011-12-13 DIAGNOSIS — E119 Type 2 diabetes mellitus without complications: Secondary | ICD-10-CM | POA: Diagnosis present

## 2011-12-13 DIAGNOSIS — R0781 Pleurodynia: Secondary | ICD-10-CM

## 2011-12-13 DIAGNOSIS — E1169 Type 2 diabetes mellitus with other specified complication: Secondary | ICD-10-CM | POA: Diagnosis present

## 2011-12-13 DIAGNOSIS — Z8249 Family history of ischemic heart disease and other diseases of the circulatory system: Secondary | ICD-10-CM

## 2011-12-13 DIAGNOSIS — R0602 Shortness of breath: Secondary | ICD-10-CM | POA: Diagnosis present

## 2011-12-13 DIAGNOSIS — Z885 Allergy status to narcotic agent status: Secondary | ICD-10-CM

## 2011-12-13 DIAGNOSIS — I959 Hypotension, unspecified: Secondary | ICD-10-CM | POA: Diagnosis present

## 2011-12-13 DIAGNOSIS — E785 Hyperlipidemia, unspecified: Secondary | ICD-10-CM

## 2011-12-13 DIAGNOSIS — N179 Acute kidney failure, unspecified: Secondary | ICD-10-CM | POA: Diagnosis present

## 2011-12-13 DIAGNOSIS — Z794 Long term (current) use of insulin: Secondary | ICD-10-CM

## 2011-12-13 DIAGNOSIS — Z79899 Other long term (current) drug therapy: Secondary | ICD-10-CM

## 2011-12-13 DIAGNOSIS — Z888 Allergy status to other drugs, medicaments and biological substances status: Secondary | ICD-10-CM

## 2011-12-13 DIAGNOSIS — Z880 Allergy status to penicillin: Secondary | ICD-10-CM

## 2011-12-13 DIAGNOSIS — Z9889 Other specified postprocedural states: Secondary | ICD-10-CM

## 2011-12-13 DIAGNOSIS — J189 Pneumonia, unspecified organism: Secondary | ICD-10-CM

## 2011-12-13 DIAGNOSIS — Z9079 Acquired absence of other genital organ(s): Secondary | ICD-10-CM

## 2011-12-13 DIAGNOSIS — R9389 Abnormal findings on diagnostic imaging of other specified body structures: Secondary | ICD-10-CM

## 2011-12-13 DIAGNOSIS — I2510 Atherosclerotic heart disease of native coronary artery without angina pectoris: Secondary | ICD-10-CM | POA: Diagnosis present

## 2011-12-13 HISTORY — DX: Other chronic postprocedural pain: G89.28

## 2011-12-13 HISTORY — DX: Acute myocardial infarction, unspecified: I21.9

## 2011-12-13 LAB — CBC WITH DIFFERENTIAL/PLATELET
Basophils Relative: 0 % (ref 0–1)
Eosinophils Absolute: 0.1 10*3/uL (ref 0.0–0.7)
MCH: 28.6 pg (ref 26.0–34.0)
MCHC: 35.3 g/dL (ref 30.0–36.0)
Neutrophils Relative %: 72 % (ref 43–77)
Platelets: 239 10*3/uL (ref 150–400)
RBC: 4.72 MIL/uL (ref 3.87–5.11)

## 2011-12-13 LAB — TROPONIN I: Troponin I: <0.3 ng/mL (ref <0.30)

## 2011-12-13 LAB — BASIC METABOLIC PANEL
GFR calc Af Amer: 30 mL/min — ABNORMAL LOW (ref >90)
GFR calc non Af Amer: 26 mL/min — ABNORMAL LOW (ref >90)
Potassium: 3.3 mEq/L — ABNORMAL LOW (ref 3.5–5.1)
Sodium: 132 mEq/L — ABNORMAL LOW (ref 135–145)

## 2011-12-13 MED ORDER — MORPHINE SULFATE 4 MG/ML IJ SOLN
4.0000 mg | Freq: Once | INTRAMUSCULAR | Status: AC
  Administered 2011-12-13: 4 mg via INTRAVENOUS
  Filled 2011-12-13: qty 1

## 2011-12-13 MED ORDER — LEVOFLOXACIN IN D5W 750 MG/150ML IV SOLN
750.0000 mg | Freq: Once | INTRAVENOUS | Status: AC
  Administered 2011-12-14: 750 mg via INTRAVENOUS
  Filled 2011-12-13: qty 150

## 2011-12-13 MED ORDER — HYDROMORPHONE HCL PF 1 MG/ML IJ SOLN: 1.0000 mg | INTRAMUSCULAR | Status: AC | PRN

## 2011-12-13 MED ORDER — SODIUM CHLORIDE 0.9 % IV SOLN
INTRAVENOUS | Status: AC
  Administered 2011-12-14: 02:00:00 via INTRAVENOUS

## 2011-12-13 MED ORDER — SODIUM CHLORIDE 0.9 % IV BOLUS (SEPSIS)
1000.0000 mL | Freq: Once | INTRAVENOUS | Status: AC
  Administered 2011-12-13: 1000 mL via INTRAVENOUS

## 2011-12-13 MED ORDER — DIPHENHYDRAMINE HCL 50 MG/ML IJ SOLN
25.0000 mg | Freq: Once | INTRAMUSCULAR | Status: AC
  Administered 2011-12-13: 25 mg via INTRAVENOUS
  Filled 2011-12-13: qty 1

## 2011-12-13 MED ORDER — ENOXAPARIN SODIUM 100 MG/ML ~~LOC~~ SOLN
90.0000 mg | Freq: Once | SUBCUTANEOUS | Status: AC
  Administered 2011-12-14: 90 mg via SUBCUTANEOUS
  Filled 2011-12-13: qty 1

## 2011-12-13 MED ORDER — ONDANSETRON HCL 4 MG/2ML IJ SOLN: 4.0000 mg | Freq: Three times a day (TID) | INTRAMUSCULAR | Status: AC | PRN

## 2011-12-13 NOTE — ED Notes (Addendum)
Pt st's yesterday she did not feel well and today has had shortness of breath and hypotension.  Pt was sent to ED from [facility] for eval.  Pt denies any chest pain. Pt c/o aching all over.

## 2011-12-13 NOTE — ED Notes (Signed)
EKG completed and signed by Dr. Ray. 

## 2011-12-13 NOTE — ED Provider Notes (Signed)
History     CSN: [id number]  Arrival date & time 12/13/11  1552   First MD Initiated Contact with Patient 12/13/11 2057      Chief Complaint  Patient presents with  . Shortness of Breath    (Consider location/radiation/quality/duration/timing/severity/associated sxs/prior treatment) HPI  [name] is a 56 y.o. female with past medical history significant for insulin-dependent diabetes, high blood pressure and high cholesterol c/o SOB, over the last 24 hours. Patient was seen by her primary care Dr. at [facility] and sent to the emergency room for evaluation of dehydration and shortness of breath. Patient denies cough, fever, chest pain change in bowel or bladder habits. Patient endorses a chronic level of upper abdominal pain. She states this is severe at 10 out of 10 at this moment this is not atypical for her. She has history of multiple SBO's. Patient also endorses diffuse myalgia.   Past Medical History  Diagnosis Date  . Diabetes mellitus   . Hypertension     History reviewed. No pertinent past surgical history.  No family history on file.  History  Substance Use Topics  . Smoking status: Never Smoker   . Smokeless tobacco: Not on file  . Alcohol Use: No    OB History    Grav Para Term Preterm Abortions TAB SAB Ect Mult Living                  Review of Systems  Constitutional: Negative for fever.  Respiratory: Positive for shortness of breath.   Cardiovascular: Negative for chest pain.  Gastrointestinal: Negative for nausea, vomiting, abdominal pain and diarrhea.  All other systems reviewed and are negative.    Allergies  Codeine; Cymbalta; Morphine and related; Penicillins; Tramadol; and Trazodone and nefazodone  Home Medications   Current Outpatient Rx  Name  Route  Sig  Dispense  Refill  . ATENOLOL 100 MG PO TABS   Oral   Take 100 mg by mouth daily.         Marland Kitchen HYDROCODONE-ACETAMINOPHEN 5-500 MG PO TABS   Oral   Take 1 tablet by mouth at  bedtime as needed. For pain         . INSULIN GLARGINE 100 UNIT/ML [place] SOLN   Subcutaneous   Inject 20-25 Units into the skin 2 (two) times daily. Take 20 units in the morning and 25 units at night         . INSULIN ISOPHANE HUMAN 100 UNIT/ML [place] SUSP   Subcutaneous   Inject 5-15 Units into the skin. 15 in the am and 5 at night         . LISINOPRIL 40 MG PO TABS   Oral   Take 40 mg by mouth daily.         Marland Kitchen METFORMIN HCL 500 MG PO TABS   Oral   Take 500 mg by mouth 2 (two) times daily with a meal.         . POTASSIUM CHLORIDE ER 10 MEQ PO TBCR   Oral   Take 10 mEq by mouth daily.         Marland Kitchen ROSUVASTATIN CALCIUM 40 MG PO TABS   Oral   Take 40 mg by mouth daily.           BP 122/76  Pulse 72  Temp 98.1 F (36.7 C) (Oral)  Resp 18  SpO2 94%  Physical Exam  Nursing note and vitals reviewed. Constitutional: She is oriented to person, place,  and time. She appears well-developed and well-nourished. No distress.  HENT:  Head: Normocephalic and atraumatic.  Mouth/Throat: Oropharynx is clear and moist.  Eyes: Conjunctivae normal and EOM are normal. Pupils are equal, round, and reactive to light.  Cardiovascular: Normal rate, regular rhythm and intact distal pulses.   Pulmonary/Chest: Effort normal and breath sounds normal. No stridor. No respiratory distress. She has no wheezes. She has no rales. She exhibits no tenderness.  Abdominal: Soft. Bowel sounds are normal. She exhibits no distension and no mass. There is tenderness. There is no rebound and no guarding.       She is tender to palpation of bilateral upper quadrants, no rebound or peritoneal signs.  Musculoskeletal: Normal range of motion. She exhibits tenderness. She exhibits no edema.       Past equal bilaterally, no superficial collaterals or edema. Patient is is tender to palpation of bilateral calves  Neurological: She is alert and oriented to person, place, and time.  Skin: Skin is warm.  Psychiatric:  She has a normal mood and affect.    ED Course  Procedures (including critical care time)  Labs Reviewed  CBC WITH DIFFERENTIAL - Abnormal; Notable for the following:    WBC 11.2 (*)     Neutro Abs 8.0 (*)     All other components within normal limits  BASIC METABOLIC PANEL - Abnormal; Notable for the following:    Sodium 132 (*)     Potassium 3.3 (*)     Chloride 93 (*)     Glucose, Bld 151 (*)     Creatinine, Ser 2.06 (*)     GFR calc non Af Amer 26 (*)     GFR calc Af Amer 30 (*)     All other components within normal limits  TROPONIN I  URINALYSIS, ROUTINE W REFLEX MICROSCOPIC   Dg Chest 2 View  12/13/2011  *RADIOLOGY REPORT*  Clinical Data: Shortness of breath.  CHEST - 2 VIEW  Comparison: Chest x-ray 02/09/2008.  Findings: Lung volumes are normal.  There are multiple new ill- defined opacities enters somewhat nodular and mass-like in appearance, most pronounced in the perihilar aspect of the left lung.  No pleural effusions.  Pulmonary vasculature appears mildly engorged, without frank pulmonary edema.  Heart size is borderline enlarged.  Mediastinal contours are within normal limits. Atherosclerosis of the thoracic aorta.  IMPRESSION: 1.  New multi focal ill-defined somewhat nodular and masslike opacities in the lungs bilaterally, particularly in the left perihilar region.  While this could certainly reflect multifocal infection, clinical correlation is recommended.  If there is a history of acute chest pain, the possibility of multifocal pulmonary embolism with areas of hemorrhage from pulmonary infarction warrants consideration.  Alternatively, if there is a history of smoking or documented history of primary malignancy, primary lung neoplasm or metastatic disease to the chest may warrant consideration.  For better evaluation of all these findings, consideration for contrast enhanced chest CT (potentially PE protocol CT scan if indicated) is recommended. 2.  Atherosclerosis.    Original Report Authenticated By: Trudie Reed, M.D.     Date: 12/13/2011  Rate: 64  Rhythm: normal sinus rhythm  QRS Axis: normal  Intervals: normal  ST/T Wave abnormalities: normal  Conduction Disutrbances:none  Narrative Interpretation:   Old EKG Reviewed: unchanged   1. CAP (community acquired pneumonia)   2. Dyspnea   3. Abnormal chest x-ray       MDM  Patient sent by primary care Dr. for evaluation  of new onset shortness of breath and possible hypotension for/dehydration.  Patient does have an acute kidney injury with a creatinine of 2. Chest was a mild leukocytosis of 11.1. Her chest x-ray shows multiple ill-defined opacities is read as multifocal infection versus pulmonary embolism versus neoplasm.  Chest CT to further evaluate this is indicated however it is not possible due to her renal failure. She will be admitted, hydrated and CT planned as an inpatient.  Filed Vitals:   12/13/11 1611 12/13/11 2014 12/13/11 2131  BP: 96/58 122/76 141/84  Pulse: 62 72   Temp: 98.1 F (36.7 C) 98.1 F (36.7 C)   TempSrc: Oral Oral   Resp: 20 18 18   SpO2: 97% 94% 99%    Patient was a former smoker approximately 20 pack years, denies h/o Ca.   Pt denies h/o GI bleed, I will start her on lovenox for possible PE  Patient will be admitted to a telemetry bed under the care of Dr. Lovell Sheehan [facility] 10.      Wynetta Emery, PA-C 12/14/11 [phone]

## 2011-12-14 ENCOUNTER — Encounter (HOSPITAL_COMMUNITY): Payer: Self-pay | Admitting: *Deleted

## 2011-12-14 ENCOUNTER — Observation Stay (HOSPITAL_COMMUNITY): Payer: Managed Care, Other (non HMO)

## 2011-12-14 DIAGNOSIS — R0609 Other forms of dyspnea: Secondary | ICD-10-CM

## 2011-12-14 DIAGNOSIS — R0989 Other specified symptoms and signs involving the circulatory and respiratory systems: Secondary | ICD-10-CM

## 2011-12-14 DIAGNOSIS — R0781 Pleurodynia: Secondary | ICD-10-CM | POA: Diagnosis present

## 2011-12-14 DIAGNOSIS — R918 Other nonspecific abnormal finding of lung field: Secondary | ICD-10-CM

## 2011-12-14 DIAGNOSIS — R071 Chest pain on breathing: Secondary | ICD-10-CM

## 2011-12-14 DIAGNOSIS — M79609 Pain in unspecified limb: Secondary | ICD-10-CM

## 2011-12-14 DIAGNOSIS — R0602 Shortness of breath: Secondary | ICD-10-CM | POA: Diagnosis present

## 2011-12-14 DIAGNOSIS — I2510 Atherosclerotic heart disease of native coronary artery without angina pectoris: Secondary | ICD-10-CM | POA: Diagnosis present

## 2011-12-14 DIAGNOSIS — I959 Hypotension, unspecified: Secondary | ICD-10-CM

## 2011-12-14 DIAGNOSIS — E119 Type 2 diabetes mellitus without complications: Secondary | ICD-10-CM | POA: Diagnosis present

## 2011-12-14 DIAGNOSIS — E1169 Type 2 diabetes mellitus with other specified complication: Secondary | ICD-10-CM | POA: Diagnosis present

## 2011-12-14 DIAGNOSIS — J189 Pneumonia, unspecified organism: Secondary | ICD-10-CM

## 2011-12-14 DIAGNOSIS — N179 Acute kidney failure, unspecified: Secondary | ICD-10-CM | POA: Diagnosis present

## 2011-12-14 DIAGNOSIS — E785 Hyperlipidemia, unspecified: Secondary | ICD-10-CM | POA: Diagnosis present

## 2011-12-14 HISTORY — DX: Acute kidney failure, unspecified: N17.9

## 2011-12-14 LAB — CBC
HCT: 37.6 % (ref 36.0–46.0)
Hemoglobin: 13 g/dL (ref 12.0–15.0)
MCV: 81.6 fL (ref 78.0–100.0)
RBC: 4.61 MIL/uL (ref 3.87–5.11)
WBC: 11.1 10*3/uL — ABNORMAL HIGH (ref 4.0–10.5)

## 2011-12-14 LAB — URINALYSIS, ROUTINE W REFLEX MICROSCOPIC
Nitrite: NEGATIVE
Specific Gravity, Urine: 1.012 (ref 1.005–1.030)
Urobilinogen, UA: 2 mg/dL — ABNORMAL HIGH (ref 0.0–1.0)

## 2011-12-14 LAB — GLUCOSE, CAPILLARY
Glucose-Capillary: 135 mg/dL — ABNORMAL HIGH (ref 70–99)
Glucose-Capillary: 153 mg/dL — ABNORMAL HIGH (ref 70–99)
Glucose-Capillary: 153 mg/dL — ABNORMAL HIGH (ref 70–99)
Glucose-Capillary: 230 mg/dL — ABNORMAL HIGH (ref 70–99)

## 2011-12-14 LAB — BASIC METABOLIC PANEL
CO2: 25 mEq/L (ref 19–32)
Chloride: 98 mEq/L (ref 96–112)
Glucose, Bld: 135 mg/dL — ABNORMAL HIGH (ref 70–99)
Sodium: 133 mEq/L — ABNORMAL LOW (ref 135–145)

## 2011-12-14 LAB — TROPONIN I
Troponin I: <0.3 ng/mL (ref <0.30)
Troponin I: <0.3 ng/mL (ref <0.30)

## 2011-12-14 MED ORDER — GUAIFENESIN ER 600 MG PO TB12
600.0000 mg | ORAL_TABLET | Freq: Two times a day (BID) | ORAL | Status: DC
  Administered 2011-12-14 – 2011-12-17 (×7): 600 mg via ORAL
  Filled 2011-12-14 (×8): qty 1

## 2011-12-14 MED ORDER — SODIUM CHLORIDE 0.9 % IV SOLN
INTRAVENOUS | Status: DC
  Administered 2011-12-15 (×2): via INTRAVENOUS

## 2011-12-14 MED ORDER — INSULIN GLARGINE 100 UNIT/ML ~~LOC~~ SOLN
25.0000 [IU] | Freq: Every day | SUBCUTANEOUS | Status: DC
  Administered 2011-12-14 – 2011-12-16 (×3): 25 [IU] via SUBCUTANEOUS

## 2011-12-14 MED ORDER — ATORVASTATIN CALCIUM 80 MG PO TABS
80.0000 mg | ORAL_TABLET | Freq: Every day | ORAL | Status: DC
  Administered 2011-12-14 – 2011-12-16 (×3): 80 mg via ORAL
  Filled 2011-12-14 (×4): qty 1

## 2011-12-14 MED ORDER — OXYCODONE HCL 5 MG PO TABS
5.0000 mg | ORAL_TABLET | ORAL | Status: DC | PRN
  Administered 2011-12-14 – 2011-12-16 (×2): 5 mg via ORAL
  Filled 2011-12-14 (×2): qty 1

## 2011-12-14 MED ORDER — ACETAMINOPHEN 325 MG PO TABS
650.0000 mg | ORAL_TABLET | Freq: Four times a day (QID) | ORAL | Status: DC | PRN
  Administered 2011-12-14: 650 mg via ORAL
  Filled 2011-12-14: qty 2

## 2011-12-14 MED ORDER — INSULIN GLARGINE 100 UNIT/ML ~~LOC~~ SOLN
20.0000 [IU] | Freq: Two times a day (BID) | SUBCUTANEOUS | Status: DC
  Administered 2011-12-14: 20 [IU] via SUBCUTANEOUS

## 2011-12-14 MED ORDER — INSULIN ASPART 100 UNIT/ML ~~LOC~~ SOLN
0.0000 [IU] | Freq: Three times a day (TID) | SUBCUTANEOUS | Status: DC
  Administered 2011-12-14 (×3): 1 [IU] via SUBCUTANEOUS
  Administered 2011-12-15: 2 [IU] via SUBCUTANEOUS
  Administered 2011-12-15 – 2011-12-16 (×4): 3 [IU] via SUBCUTANEOUS
  Administered 2011-12-16 – 2011-12-17 (×3): 2 [IU] via SUBCUTANEOUS

## 2011-12-14 MED ORDER — INSULIN GLARGINE 100 UNIT/ML ~~LOC~~ SOLN
20.0000 [IU] | Freq: Every day | SUBCUTANEOUS | Status: DC
  Administered 2011-12-14 – 2011-12-16 (×3): 20 [IU] via SUBCUTANEOUS

## 2011-12-14 MED ORDER — ONDANSETRON HCL 4 MG PO TABS: 4.0000 mg | ORAL_TABLET | Freq: Four times a day (QID) | ORAL | Status: DC | PRN

## 2011-12-14 MED ORDER — SODIUM CHLORIDE 0.9 % IJ SOLN
3.0000 mL | Freq: Two times a day (BID) | INTRAMUSCULAR | Status: DC
  Administered 2011-12-15 – 2011-12-17 (×4): 3 mL via INTRAVENOUS

## 2011-12-14 MED ORDER — ZOLPIDEM TARTRATE 5 MG PO TABS: 5.0000 mg | ORAL_TABLET | Freq: Every evening | ORAL | Status: DC | PRN

## 2011-12-14 MED ORDER — ACETAMINOPHEN 650 MG RE SUPP: 650.0000 mg | Freq: Four times a day (QID) | RECTAL | Status: DC | PRN

## 2011-12-14 MED ORDER — HYDROMORPHONE HCL PF 1 MG/ML IJ SOLN
0.5000 mg | INTRAMUSCULAR | Status: DC | PRN
  Administered 2011-12-14: 1 mg via INTRAVENOUS
  Filled 2011-12-14: qty 1

## 2011-12-14 MED ORDER — TECHNETIUM TC 99M DIETHYLENETRIAME-PENTAACETIC ACID: 40.0000 | Freq: Once | INTRAVENOUS | Status: AC | PRN

## 2011-12-14 MED ORDER — LEVOFLOXACIN IN D5W 500 MG/100ML IV SOLN
500.0000 mg | INTRAVENOUS | Status: DC
  Administered 2011-12-14 – 2011-12-16 (×3): 500 mg via INTRAVENOUS
  Filled 2011-12-14 (×4): qty 100

## 2011-12-14 MED ORDER — ONDANSETRON HCL 4 MG/2ML IJ SOLN: 4.0000 mg | Freq: Four times a day (QID) | INTRAMUSCULAR | Status: DC | PRN

## 2011-12-14 MED ORDER — ENOXAPARIN SODIUM 40 MG/0.4ML ~~LOC~~ SOLN
40.0000 mg | SUBCUTANEOUS | Status: DC
  Administered 2011-12-15 – 2011-12-17 (×3): 40 mg via SUBCUTANEOUS
  Filled 2011-12-14 (×3): qty 0.4

## 2011-12-14 MED ORDER — ALBUTEROL SULFATE (5 MG/ML) 0.5% IN NEBU
2.5000 mg | INHALATION_SOLUTION | Freq: Four times a day (QID) | RESPIRATORY_TRACT | Status: DC
  Administered 2011-12-14 – 2011-12-17 (×12): 2.5 mg via RESPIRATORY_TRACT
  Filled 2011-12-14 (×14): qty 0.5

## 2011-12-14 MED ORDER — POTASSIUM CHLORIDE ER 10 MEQ PO TBCR
10.0000 meq | EXTENDED_RELEASE_TABLET | Freq: Every day | ORAL | Status: DC
  Administered 2011-12-14 – 2011-12-15 (×2): 10 meq via ORAL
  Filled 2011-12-14 (×3): qty 1

## 2011-12-14 MED ORDER — ALUM & MAG HYDROXIDE-SIMETH 200-200-20 MG/5ML PO SUSP: 30.0000 mL | Freq: Four times a day (QID) | ORAL | Status: DC | PRN

## 2011-12-14 MED ORDER — INSULIN ASPART 100 UNIT/ML ~~LOC~~ SOLN
0.0000 [IU] | Freq: Every day | SUBCUTANEOUS | Status: DC
  Administered 2011-12-14: 2 [IU] via SUBCUTANEOUS

## 2011-12-14 MED ORDER — POTASSIUM CHLORIDE CRYS ER 20 MEQ PO TBCR
EXTENDED_RELEASE_TABLET | ORAL | Status: AC
  Administered 2011-12-14: 20 meq
  Filled 2011-12-14: qty 2

## 2011-12-14 MED ORDER — TECHNETIUM TO 99M ALBUMIN AGGREGATED
6.0000 | Freq: Once | INTRAVENOUS | Status: AC | PRN
  Administered 2011-12-14: 6 via INTRAVENOUS

## 2011-12-14 MED ORDER — ATENOLOL 100 MG PO TABS
100.0000 mg | ORAL_TABLET | Freq: Every day | ORAL | Status: DC
  Administered 2011-12-14 – 2011-12-17 (×4): 100 mg via ORAL
  Filled 2011-12-14 (×4): qty 1

## 2011-12-14 MED ORDER — ENOXAPARIN SODIUM 100 MG/ML ~~LOC~~ SOLN
1.0000 mg/kg | Freq: Two times a day (BID) | SUBCUTANEOUS | Status: DC
  Administered 2011-12-14: 95 mg via SUBCUTANEOUS
  Filled 2011-12-14 (×2): qty 1

## 2011-12-14 MED ORDER — ALBUTEROL SULFATE (5 MG/ML) 0.5% IN NEBU: 2.5000 mg | INHALATION_SOLUTION | RESPIRATORY_TRACT | Status: DC | PRN

## 2011-12-14 NOTE — Plan of Care (Signed)
Problem: Consults Goal: Diagnosis - Venous Thromboembolism (VTE) Choose a selection Outcome: Completed/Met Date Met:  12/14/11 PE (Pulmonary Embolism)

## 2011-12-14 NOTE — Progress Notes (Signed)
Bilateral:  No evidence of DVT, superficial thrombosis, or Baker's Cyst.   

## 2011-12-14 NOTE — Progress Notes (Signed)
Utilization review completed.  

## 2011-12-14 NOTE — Progress Notes (Signed)
Inpatient Diabetes Program Recommendations  AACE/ADA: New Consensus Statement on Inpatient Glycemic Control (2013)  Target Ranges:  Prepandial:   less than 140 mg/dL      Peak postprandial:   less than 180 mg/dL (1-2 hours)      Critically ill patients:  140 - 180 mg/dL      Note: Visited with the patient and inquired about home diabetic regimen.  According to the patient she takes NPH 15 units QAM and NPH 5 units QPM, and she also takes Meformin 500mg  BID.  She states that she used to be on Lantus but had to stop taking due to the cost.  Therefore the medication reconciliation was incorrect because it listed the patient taking both Lantus and NPH which is an error.  Will continue to follow. Thanks, Orlando Penner, RN, BSN, CCRN Diabetes Coordinator Inpatient Diabetes Program [phone]

## 2011-12-14 NOTE — Progress Notes (Signed)
[facility] PROGRESS NOTE  [name] MVH:[id number] DOB: May 18, 1955 DOA: 12/13/2011 PCP: Emeterio Reeve, MD  Assessment/Plan: Principal Problem:  *Pleuritic chest pain Active Problems:  SOB (shortness of breath)  Hypotension  ARF (acute renal failure)  Diabetes  Hyperlipidemia  CAD (coronary artery disease)  CAP (community acquired pneumonia)    1. Dyspnea: Patient presented with a 2-day history of progressive SOB. CXR showed new multifocal ill-defined somewhat nodular and mass-like opacities in the lungs bilaterally, particularly in the left perihilar region. Precise etiopogy was unclear at present, but multi-focal pneumonia, neoplastic disease and pulmonary emboli are all in the differential. Patient has no fever or chills, wcc is mildly elevated at 11.1. Patient was initially placed on therapeutic Lovenox, iv Levaquin, and supportive treatment. V/Q scan was reported as very low probability acute pulmonary embolism. Have switched Lovenox to prophylactic dose, accordingly. We will arrange a CT CT scan on 12/15/11, provided creatinine has normalized.  2. ARF: Patient's creatinine was 2.06 at presentation, against a known baseline creatinine of 0.91 on 05/06/11. She is being managed with iv fliuds, and already, creatinine has improved. Metformin is on hold, as is Lisinopril. Following renal indices.  3. DM: Patient has insulin-requiring type 2 DM, which appears sub-optimally controlled, based on random blood glucose. As described above, Metformin is on hold, due to ARF. Managing with diet, SSI and Lantus. HBA1C is 6.7. 4. HTN: BP is controlled at this time. Lisinopril is on hold. Will observe.   Code Status: Full Code.  Family Communication:  Disposition Plan: To be determined.   Brief narrative: [name] is a 56 y.o. female with history of DM, HTN, previous hernia repair, abdominal hysterectomy and left knee arthroscopy, who presents to the ED with complaints of  worsening SOB X 2 days and chest pain with deep breaths over the past 24 hours, without fevers chills or cough. In the ED, she was found to have an abnormal chest X-ray and an elevated D-Dimer, she was placed on full dose Lovenox therapy and empiric antibiotic therapy for CAP Pneumonia and admitted for further evaluation and management.   Consultants:  N/A.   Procedures:  CXR.  Antibiotics:  Levaquin 12/13/11>>>  HPI/Subjective: No new issues.   Objective: Vital signs in last 24 hours: Temp:  [98.1 F (36.7 C)-100.5 F (38.1 C)] 100.5 F (38.1 C) (11/08 0400) Pulse Rate:  [62-84] 75  (11/08 0400) Resp:  [18-22] 20  (11/08 0400) BP: (96-143)/(58-84) 100/63 mmHg (11/08 0400) SpO2:  [94 %-100 %] 98 % (11/08 0833) Weight:  [92.035 kg (202 lb 14.4 oz)-96.163 kg (212 lb)] 92.035 kg (202 lb 14.4 oz) (11/08 0143) Weight change:  Last BM Date: 12/12/11  Intake/Output from previous day: 11/07 0701 - 11/08 0700 In: 466.7 [I.V.:466.7] Out: -      Physical Exam: General: Comfortable, alert, communicative, fully oriented, mildly short of breath on exertion.  HEENT:  No clinical pallor, no jaundice, no conjunctival injection or discharge. NECK:  Supple, JVP not seen, no carotid bruits, no palpable lymphadenopathy, no palpable goiter. CHEST:  Clinically clear to auscultation, no wheezes, no crackles. HEART:  Sounds 1 and 2 heard, normal, regular, no murmurs. ABDOMEN:  Full, soft, non-tender, no palpable organomegaly, no palpable masses, normal bowel sounds. GENITALIA:  Not examined. LOWER EXTREMITIES:  No pitting edema, palpable peripheral pulses. MUSCULOSKELETAL SYSTEM:  Unremarkable. CENTRAL NERVOUS SYSTEM:  No focal neurologic deficit on gross examination.  Lab Results:  Basename 12/14/11 0547 12/13/11 1616  WBC 11.1* 11.2*  HGB 13.0 13.5  HCT 37.6 38.2  PLT 252 239    Basename 12/14/11 0547 12/13/11 1616  NA 133* 132*  K 3.9 3.3*  CL 98 93*  CO2 25 24  GLUCOSE 135*  151*  BUN 19 20  CREATININE 1.47* 2.06*  CALCIUM 9.2 9.7   No results found for this or any previous visit (from the past 240 hour(s)).   Studies/Results: Dg Chest 2 View  12/13/2011  *RADIOLOGY REPORT*  Clinical Data: Shortness of breath.  CHEST - 2 VIEW  Comparison: Chest x-ray 02/09/2008.  Findings: Lung volumes are normal.  There are multiple new ill- defined opacities enters somewhat nodular and mass-like in appearance, most pronounced in the perihilar aspect of the left lung.  No pleural effusions.  Pulmonary vasculature appears mildly engorged, without frank pulmonary edema.  Heart size is borderline enlarged.  Mediastinal contours are within normal limits. Atherosclerosis of the thoracic aorta.  IMPRESSION: 1.  New multi focal ill-defined somewhat nodular and masslike opacities in the lungs bilaterally, particularly in the left perihilar region.  While this could certainly reflect multifocal infection, clinical correlation is recommended.  If there is a history of acute chest pain, the possibility of multifocal pulmonary embolism with areas of hemorrhage from pulmonary infarction warrants consideration.  Alternatively, if there is a history of smoking or documented history of primary malignancy, primary lung neoplasm or metastatic disease to the chest may warrant consideration.  For better evaluation of all these findings, consideration for contrast enhanced chest CT (potentially PE protocol CT scan if indicated) is recommended. 2.  Atherosclerosis.   Original Report Authenticated By: Trudie Reed, M.D.     Medications: Scheduled Meds:   . sodium chloride   Intravenous STAT  . albuterol  2.5 mg Nebulization Q6H  . atenolol  100 mg Oral Daily  . atorvastatin  80 mg Oral q1800  . [COMPLETED] diphenhydrAMINE  25 mg Intravenous Once  . [COMPLETED] enoxaparin (LOVENOX) injection  90 mg Subcutaneous Once  . enoxaparin (LOVENOX) injection  1 mg/kg Subcutaneous Q12H  . insulin aspart  0-5  Units Subcutaneous QHS  . insulin aspart  0-9 Units Subcutaneous TID WC  . insulin glargine  20 Units Subcutaneous Daily  . insulin glargine  25 Units Subcutaneous QHS  . levofloxacin (LEVAQUIN) IV  500 mg Intravenous Q24H  . [COMPLETED] levofloxacin (LEVAQUIN) IV  750 mg Intravenous Once  . [COMPLETED]  morphine injection  4 mg Intravenous Once  . potassium chloride  10 mEq Oral Daily  . [COMPLETED] potassium chloride SA      . [COMPLETED] sodium chloride  1,000 mL Intravenous Once  . sodium chloride  3 mL Intravenous Q12H  . [DISCONTINUED] insulin glargine  20-25 Units Subcutaneous BID   Continuous Infusions:   . sodium chloride     PRN Meds:.acetaminophen, acetaminophen, albuterol, alum & mag hydroxide-simeth, HYDROmorphone (DILAUDID) injection, HYDROmorphone (DILAUDID) injection, ondansetron (ZOFRAN) IV, ondansetron (ZOFRAN) IV, ondansetron, oxyCODONE, zolpidem    LOS: 1 day   Dellar Traber,CHRISTOPHER  [facility] Pager [phone]. If 8PM-8AM, please contact night-coverage at [email or web address], password [facility] 12/14/2011, 10:59 AM  LOS: 1 day

## 2011-12-14 NOTE — ED Provider Notes (Signed)
Medical screening examination/treatment/procedure(s) were conducted as a shared visit with non-physician practitioner(s) and myself.  I personally evaluated the patient during the encounter  ?pneumonia on xray, but will need further eval for possible PE Pt stable in the ED BP 143/72  Pulse 74  Temp 98.1 F (36.7 C) (Oral)  Resp 22  Wt 212 lb (96.163 kg)  SpO2 100%   Joya Gaskins, MD 12/14/11 [phone]

## 2011-12-14 NOTE — H&P (Addendum)
[facility] History and Physical  [name]:[id number] DOB: August 22, 1955 DOA: 12/13/2011  Referring physician: EDP PCP: Emeterio Reeve, MD  Specialists:   Chief Complaint: Chest Pain and SOB  HPI: [name] is a 56 y.o. female who presents to the ED with complaints of worsening SOB X 2 days and chest pain with deep breaths over the past 24 hours.   She denies any fevers chills or cough.  She was evaluated in the ED and had an abnormal Chest X-ray along with an elevated D-Dimer, she was placed on Full dose Lovenox therapy and empiric antibiotic therapy for CAP Pneumonia and referred for medical admission and a V/Q scan was ordered for the AM along with venous Duplex studies of both lower extremities due to complaints of calf pain for the past few days as well.  .     Review of Systems: The patient denies anorexia, fever, weight loss, vision loss, decreased hearing, hoarseness, syncope, dyspnea on exertion, peripheral edema, balance deficits, hemoptysis, abdominal pain, melena, hematochezia, severe indigestion/heartburn, hematuria, incontinence, genital sores, muscle weakness, suspicious skin lesions, transient blindness, difficulty walking, depression, unusual weight change, abnormal bleeding, enlarged lymph nodes, angioedema, and breast masses.    Past Medical History  Diagnosis Date  . Diabetes mellitus   . Hypertension     Past Surgical History  Procedure Date  . Hernia repair 2008  . Abdominal hysterectomy 1999    partial  . Abdominal hysterectomy 2000    complete  . Cesarean section      X  2  . Knee arthroscopy     Left    Medications:  HOME MEDS: Prior to Admission medications   Medication Sig Start Date End Date Taking? Authorizing Provider  atenolol (TENORMIN) 100 MG tablet Take 100 mg by mouth daily.   Yes Historical Provider, MD  HYDROcodone-acetaminophen (VICODIN) 5-500 MG per tablet Take 1 tablet by mouth at bedtime as needed. For pain   Yes  Historical Provider, MD  insulin glargine (LANTUS) 100 UNIT/ML injection Inject 20-25 Units into the skin 2 (two) times daily. Take 20 units in the morning and 25 units at night   Yes Historical Provider, MD  insulin NPH (HUMULIN N,NOVOLIN N) 100 UNIT/ML injection Inject 5-15 Units into the skin. 15 in the am and 5 at night   Yes Historical Provider, MD  lisinopril (PRINIVIL,ZESTRIL) 40 MG tablet Take 40 mg by mouth daily.   Yes Historical Provider, MD  metFORMIN (GLUCOPHAGE) 500 MG tablet Take 500 mg by mouth 2 (two) times daily with a meal.   Yes Historical Provider, MD  potassium chloride (K-DUR) 10 MEQ tablet Take 10 mEq by mouth daily.   Yes Historical Provider, MD  rosuvastatin (CRESTOR) 40 MG tablet Take 40 mg by mouth daily.   Yes Historical Provider, MD     Allergies  Allergen Reactions  . Codeine Other (See Comments)    unknown  . Cymbalta (Duloxetine Hcl)   . Morphine And Related Other (See Comments)    unknown  . Penicillins Other (See Comments)    unknown  . Tramadol     unknown  . Trazodone And Nefazodone     Social History:  reports that she has never smoked. She does not have any smokeless tobacco history on file. She reports that she does not drink alcohol. Her drug history not on file.   Family History: Family History  Problem Relation Age of Onset  . CAD Brother  X 2  . CAD Sister      X 1  . Hypertension      ALL    Physical Exam:  GEN: Pleasant  56 year old well nourished and well developed African American Female examined  and in no acute distress; cooperative with exam Filed Vitals:   12/13/11 2131 12/13/11 2341 12/14/11 0054 12/14/11 0143  BP: 141/84  143/72 133/80  Pulse:   74 84  Temp:    98.3 F (36.8 C)  TempSrc:    Oral  Resp: 18  22 20   Height:    5\' 5"  (1.651 m)  Weight:  96.163 kg (212 lb)  92.035 kg (202 lb 14.4 oz)  SpO2: 99%  100% 95%   Blood pressure 133/80, pulse 84, temperature 98.3 F (36.8 C), temperature source Oral,  resp. rate 20, height 5\' 5"  (1.651 m), weight 92.035 kg (202 lb 14.4 oz), SpO2 95.00%. PSYCH: She is alert and oriented x4; does not appear anxious does not appear depressed; affect is normal HEENT: Normocephalic and Atraumatic, Mucous membranes pink; PERRLA; EOM intact; Fundi:  Benign;  No scleral icterus, Nares: Patent, Oropharynx: Clear, Edentulous  With Dentures present,  Neck:  FROM, no cervical lymphadenopathy nor thyromegaly or carotid bruit; no JVD; Breasts:: Not examined CHEST WALL: No tenderness CHEST: Normal respiration, clear to auscultation bilaterally HEART: Regular rate and rhythm; no murmurs rubs or gallops BACK: No kyphosis or scoliosis; no CVA tenderness ABDOMEN: Positive Bowel Sounds, Scaphoid, Obese, soft non-tender; no masses, no organomegaly.   Rectal Exam: Not done EXTREMITIES: No bone or joint deformity; age-appropriate arthropathy of the hands and knees; no cyanosis, clubbing or edema; no ulcerations. Genitalia: not examined PULSES: 2+ and symmetric SKIN: Normal hydration no rash or ulceration CNS: Cranial nerves 2-12 grossly intact no focal neurologic deficit   Labs on Admission:  Basic Metabolic Panel:  Lab 12/13/11 4098  NA 132*  K 3.3*  CL 93*  CO2 24  GLUCOSE 151*  BUN 20  CREATININE 2.06*  CALCIUM 9.7  MG --  PHOS --   Liver Function Tests: No results found for this basename: AST:5,ALT:5,ALKPHOS:5,BILITOT:5,PROT:5,ALBUMIN:5 in the last 168 hours No results found for this basename: LIPASE:5,AMYLASE:5 in the last 168 hours No results found for this basename: AMMONIA:5 in the last 168 hours CBC:  Lab 12/13/11 1616  WBC 11.2*  NEUTROABS 8.0*  HGB 13.5  HCT 38.2  MCV 80.9  PLT 239   Cardiac Enzymes:  Lab 12/13/11 1617  CKTOTAL --  CKMB --  CKMBINDEX --  TROPONINI <0.30    BNP (last 3 results) No results found for this basename: PROBNP:3 in the last 8760 hours CBG: No results found for this basename: GLUCAP:5 in the last 168  hours  Radiological Exams on Admission: Dg Chest 2 View  12/13/2011  *RADIOLOGY REPORT*  Clinical Data: Shortness of breath.  CHEST - 2 VIEW  Comparison: Chest x-ray 02/09/2008.  Findings: Lung volumes are normal.  There are multiple new ill- defined opacities enters somewhat nodular and mass-like in appearance, most pronounced in the perihilar aspect of the left lung.  No pleural effusions.  Pulmonary vasculature appears mildly engorged, without frank pulmonary edema.  Heart size is borderline enlarged.  Mediastinal contours are within normal limits. Atherosclerosis of the thoracic aorta.  IMPRESSION: 1.  New multi focal ill-defined somewhat nodular and masslike opacities in the lungs bilaterally, particularly in the left perihilar region.  While this could certainly reflect multifocal infection, clinical correlation is recommended.  If there is a history of acute chest pain, the possibility of multifocal pulmonary embolism with areas of hemorrhage from pulmonary infarction warrants consideration.  Alternatively, if there is a history of smoking or documented history of primary malignancy, primary lung neoplasm or metastatic disease to the chest may warrant consideration.  For better evaluation of all these findings, consideration for contrast enhanced chest CT (potentially PE protocol CT scan if indicated) is recommended. 2.  Atherosclerosis.   Original Report Authenticated By: Trudie Reed, M.D.     EKG:  Normal Sinus Rhythm rate 64,  No acute ST changes.    Assessment: Principal Problem:  *Pleuritic chest pain Active Problems:  SOB (shortness of breath)  Hypotension  CAP (community acquired pneumonia)  ARF (acute renal failure)  Diabetes  Hyperlipidemia  CAD (coronary artery disease)   Plan:     Admit to Telemetry Bed V/Q Scan in the AM Empiric Full dose Lovenox therapy until ruled out.   O2 Gentle IVFs Cardiac Enzymes SSI coverage Reconcile Home Medications   Code Status:   FULL CODE Family Communication:  Daughter at Bedside Disposition Plan: Return to Home  Time spent:  28 Minutes  Aashika-Ann Pennella C [facility]

## 2011-12-15 ENCOUNTER — Observation Stay (HOSPITAL_COMMUNITY): Payer: Managed Care, Other (non HMO)

## 2011-12-15 LAB — COMPREHENSIVE METABOLIC PANEL
ALT: 16 U/L (ref 0–35)
Alkaline Phosphatase: 107 U/L (ref 39–117)
BUN: 15 mg/dL (ref 6–23)
CO2: 22 mEq/L (ref 19–32)
GFR calc Af Amer: 67 mL/min — ABNORMAL LOW (ref >90)
GFR calc non Af Amer: 58 mL/min — ABNORMAL LOW (ref >90)
Glucose, Bld: 159 mg/dL — ABNORMAL HIGH (ref 70–99)
Potassium: 3.5 mEq/L (ref 3.5–5.1)
Sodium: 133 mEq/L — ABNORMAL LOW (ref 135–145)
Total Protein: 7.9 g/dL (ref 6.0–8.3)

## 2011-12-15 LAB — CBC
HCT: 36.5 % (ref 36.0–46.0)
Hemoglobin: 12.3 g/dL (ref 12.0–15.0)
MCH: 27.3 pg (ref 26.0–34.0)
MCHC: 33.7 g/dL (ref 30.0–36.0)
RBC: 4.5 MIL/uL (ref 3.87–5.11)

## 2011-12-15 LAB — HIV ANTIBODY (ROUTINE TESTING W REFLEX): HIV: NONREACTIVE

## 2011-12-15 MED ORDER — IOHEXOL 300 MG/ML  SOLN
75.0000 mL | Freq: Once | INTRAMUSCULAR | Status: AC | PRN
  Administered 2011-12-15: 75 mL via INTRAVENOUS

## 2011-12-15 NOTE — Progress Notes (Signed)
[facility] PROGRESS NOTE  [name] ZOX:[id number] DOB: Jun 01, 1955 DOA: 12/13/2011 PCP: Emeterio Reeve, MD  Assessment/Plan: Principal Problem:  *Pleuritic chest pain Active Problems:  SOB (shortness of breath)  Hypotension  ARF (acute renal failure)  Diabetes  Hyperlipidemia  CAD (coronary artery disease)  CAP (community acquired pneumonia)    1. Dyspnea: Patient presented with a 2-day history of progressive SOB. CXR showed new multifocal ill-defined somewhat nodular and mass-like opacities in the lungs bilaterally, particularly in the left perihilar region. Precise etiopogy was unclear initially, but multi-focal pneumonia, neoplastic disease and pulmonary emboli were all in the differential. Patient has no fever or chills, wcc was mildly elevated at 11.1. She was initially placed on therapeutic Lovenox, iv Levaquin, and supportive treatment. V/Q scan was reported as very low probability for acute pulmonary embolism. Have switched Lovenox to prophylactic dose, accordingly. Chest CT scan has been ordered today, to get a more complete evaluation of pulmonary anatomy, provided creatinine has normalized. Wcc has normalized at 7.8 today, patient feels much better. HIV test has been ordered for completeness.  2. ARF: Patient's creatinine was 2.06 at presentation, against a known baseline creatinine of 0.91 on 05/06/11. She is being managed with iv fliuds, and already, creatinine has improved. Metformin is on hold, as is Lisinopril. Following renal indices.  3. DM: Patient has insulin-requiring type 2 DM, which appears sub-optimally controlled, based on random blood glucose. As described above, Metformin is on hold, due to ARF. Managing with diet, SSI and Lantus, with satisfactory control. HBA1C is 6.7. 4. HTN: BP is controlled at this time. Lisinopril is on hold. Will observe.   Code Status: Full Code.  Family Communication:  Disposition Plan: To be determined.   Brief  narrative: [name] is a 56 y.o. female with history of DM, HTN, previous hernia repair, abdominal hysterectomy and left knee arthroscopy, who presents to the ED with complaints of worsening SOB X 2 days and chest pain with deep breaths over the past 24 hours, without fevers chills or cough. In the ED, she was found to have an abnormal chest X-ray and an elevated D-Dimer, she was placed on full dose Lovenox therapy and empiric antibiotic therapy for CAP Pneumonia and admitted for further evaluation and management.   Consultants:  N/A.   Procedures:  CXR.  Antibiotics:  Levaquin 12/13/11>>>  HPI/Subjective: Feels much better.   Objective: Vital signs in last 24 hours: Temp:  [98.2 F (36.8 C)-99.1 F (37.3 C)] 99.1 F (37.3 C) (11/09 0400) Pulse Rate:  [71-77] 74  (11/09 0400) Resp:  [18-20] 20  (11/09 0400) BP: (94-137)/(58-80) 94/58 mmHg (11/09 0400) SpO2:  [93 %-99 %] 93 % (11/09 0400) Weight change:  Last BM Date: 12/14/11  Intake/Output from previous day: 11/08 0701 - 11/09 0700 In: -  Out: 800 [Urine:800]     Physical Exam: General: Comfortable, alert, communicative, fully oriented, mildly short of breath on exertion.  HEENT:  No clinical pallor, no jaundice, no conjunctival injection or discharge. NECK:  Supple, JVP not seen, no carotid bruits, no palpable lymphadenopathy, no palpable goiter. CHEST:  Clinically clear to auscultation, no wheezes, no crackles. HEART:  Sounds 1 and 2 heard, normal, regular, no murmurs. ABDOMEN:  Full, soft, non-tender, no palpable organomegaly, no palpable masses, normal bowel sounds. GENITALIA:  Not examined. LOWER EXTREMITIES:  No pitting edema, palpable peripheral pulses. MUSCULOSKELETAL SYSTEM:  Unremarkable. CENTRAL NERVOUS SYSTEM:  No focal neurologic deficit on gross examination.  Lab Results:  [facility] 12/15/11  0700 12/14/11 0547  WBC 7.8 11.1*  HGB 12.3 13.0  HCT 36.5 37.6  PLT 268 252    Basename 12/14/11  0547 12/13/11 1616  NA 133* 132*  K 3.9 3.3*  CL 98 93*  CO2 25 24  GLUCOSE 135* 151*  BUN 19 20  CREATININE 1.47* 2.06*  CALCIUM 9.2 9.7   No results found for this or any previous visit (from the past 240 hour(s)).   Studies/Results: Dg Chest 2 View  12/13/2011  *RADIOLOGY REPORT*  Clinical Data: Shortness of breath.  CHEST - 2 VIEW  Comparison: Chest x-ray 02/09/2008.  Findings: Lung volumes are normal.  There are multiple new ill- defined opacities enters somewhat nodular and mass-like in appearance, most pronounced in the perihilar aspect of the left lung.  No pleural effusions.  Pulmonary vasculature appears mildly engorged, without frank pulmonary edema.  Heart size is borderline enlarged.  Mediastinal contours are within normal limits. Atherosclerosis of the thoracic aorta.  IMPRESSION: 1.  New multi focal ill-defined somewhat nodular and masslike opacities in the lungs bilaterally, particularly in the left perihilar region.  While this could certainly reflect multifocal infection, clinical correlation is recommended.  If there is a history of acute chest pain, the possibility of multifocal pulmonary embolism with areas of hemorrhage from pulmonary infarction warrants consideration.  Alternatively, if there is a history of smoking or documented history of primary malignancy, primary lung neoplasm or metastatic disease to the chest may warrant consideration.  For better evaluation of all these findings, consideration for contrast enhanced chest CT (potentially PE protocol CT scan if indicated) is recommended. 2.  Atherosclerosis.   Original Report Authenticated By: Trudie Reed, M.D.    Nm Pulmonary Perf And Vent  12/14/2011  *RADIOLOGY REPORT*  Clinical Data:  Short of breath, concern pulmonary embolism.  NUCLEAR MEDICINE VENTILATION - PERFUSION LUNG SCAN  Technique:  Ventilation images were obtained in multiple projections using inhaled aerosol technetium 99 M DTPA.  Perfusion images  were obtained in multiple projections after intravenous injection of Tc-55m MAA.  Radiopharmaceuticals:  Tc-61m DTPA aerosol and six mCi Tc-31m MAA.  Comparison: Chest radiograph 12/13/2011  Findings:  Ventilation:  No large ventilation defects are present.  Several small peripheral ventilation defects are present.  Perfusion:  There are no  large segmental wedge shaped perfusion defects to  suggest acute pulmonary embolism.  There are several small peripheral perfusion defects in the right upper lobe predominantly.  These are either matched or unmatched.  These likely correspond to the air space disease seen on comparison chest radiograph.  IMPRESSION:  1. Very low probability acute pulmonary embolism.  2.  Small perfusion defects likely relate to pneumonia seen on comparison exam.   Original Report Authenticated By: Genevive Bi, M.D.     Medications: Scheduled Meds:    . [COMPLETED] sodium chloride   Intravenous STAT  . albuterol  2.5 mg Nebulization Q6H  . atenolol  100 mg Oral Daily  . atorvastatin  80 mg Oral q1800  . enoxaparin (LOVENOX) injection  40 mg Subcutaneous Q24H  . guaiFENesin  600 mg Oral BID  . insulin aspart  0-5 Units Subcutaneous QHS  . insulin aspart  0-9 Units Subcutaneous TID WC  . insulin glargine  20 Units Subcutaneous Daily  . insulin glargine  25 Units Subcutaneous QHS  . levofloxacin (LEVAQUIN) IV  500 mg Intravenous Q24H  . potassium chloride  10 mEq Oral Daily  . sodium chloride  3 mL Intravenous Q12H  . [  DISCONTINUED] enoxaparin (LOVENOX) injection  1 mg/kg Subcutaneous Q12H  . [DISCONTINUED] insulin glargine  20-25 Units Subcutaneous BID   Continuous Infusions:    . sodium chloride 100 mL/hr at 12/15/11 0302   PRN Meds:.acetaminophen, acetaminophen, albuterol, alum & mag hydroxide-simeth, HYDROmorphone (DILAUDID) injection, [EXPIRED]  HYDROmorphone (DILAUDID) injection, [EXPIRED] ondansetron (ZOFRAN) IV, ondansetron (ZOFRAN) IV, ondansetron,  oxyCODONE, [COMPLETED] technetium albumin aggregated, [EXPIRED] technetium TC 43M diethylenetriame-pentaacetic acid, zolpidem    LOS: 2 days   Anaily Ashbaugh,CHRISTOPHER  [facility] Pager [phone]. If 8PM-8AM, please contact night-coverage at [email or web address], password [facility] 12/15/2011, 8:18 AM  LOS: 2 days

## 2011-12-16 LAB — GLUCOSE, CAPILLARY
Glucose-Capillary: 201 mg/dL — ABNORMAL HIGH (ref 70–99)
Glucose-Capillary: 215 mg/dL — ABNORMAL HIGH (ref 70–99)
Glucose-Capillary: 177 mg/dL — ABNORMAL HIGH (ref 70–99)

## 2011-12-16 LAB — CBC
Hemoglobin: 12.5 g/dL (ref 12.0–15.0)
MCH: 27.6 pg (ref 26.0–34.0)
MCV: 80.4 fL (ref 78.0–100.0)
RBC: 4.53 MIL/uL (ref 3.87–5.11)
WBC: 7.9 10*3/uL (ref 4.0–10.5)

## 2011-12-16 LAB — BASIC METABOLIC PANEL
CO2: 21 mEq/L (ref 19–32)
Calcium: 9.6 mg/dL (ref 8.4–10.5)
Chloride: 98 mEq/L (ref 96–112)
Glucose, Bld: 172 mg/dL — ABNORMAL HIGH (ref 70–99)
Sodium: 133 mEq/L — ABNORMAL LOW (ref 135–145)

## 2011-12-16 MED ORDER — GUAIFENESIN 100 MG/5ML PO SOLN
5.0000 mL | ORAL | Status: DC | PRN
  Filled 2011-12-16: qty 5

## 2011-12-16 MED ORDER — POTASSIUM CHLORIDE ER 10 MEQ PO TBCR
10.0000 meq | EXTENDED_RELEASE_TABLET | Freq: Every day | ORAL | Status: DC
  Administered 2011-12-17: 10 meq via ORAL
  Filled 2011-12-16: qty 1

## 2011-12-16 MED ORDER — METFORMIN HCL 500 MG PO TABS
500.0000 mg | ORAL_TABLET | Freq: Two times a day (BID) | ORAL | Status: DC
  Administered 2011-12-16 – 2011-12-17 (×2): 500 mg via ORAL
  Filled 2011-12-16 (×4): qty 1

## 2011-12-16 MED ORDER — INSULIN GLARGINE 100 UNIT/ML ~~LOC~~ SOLN
25.0000 [IU] | Freq: Every day | SUBCUTANEOUS | Status: DC
  Administered 2011-12-17: 25 [IU] via SUBCUTANEOUS

## 2011-12-16 NOTE — Progress Notes (Addendum)
[facility] PROGRESS NOTE  [name] ZOX:[id number] DOB: 05/04/1955 DOA: 12/13/2011 PCP: Emeterio Reeve, MD  Assessment/Plan: Principal Problem:  *Pleuritic chest pain Active Problems:  SOB (shortness of breath)  Hypotension  ARF (acute renal failure)  Diabetes  Hyperlipidemia  CAD (coronary artery disease)  CAP (community acquired pneumonia)    1. Dyspnea: Patient presented with a 2-day history of progressive SOB. CXR showed new multifocal ill-defined somewhat nodular and mass-like opacities in the lungs bilaterally, particularly in the left perihilar region. Precise etiopogy was unclear initially, but multi-focal pneumonia, neoplastic disease and pulmonary emboli were all in the differential. Patient has no fever or chills, wcc was mildly elevated at 11.1. She was initially placed on therapeutic Lovenox, iv Levaquin, and supportive treatment. V/Q scan was reported as very low probability for acute pulmonary embolism. Have switched Lovenox to prophylactic dose, accordingly. Chest CT scan on 12/15/11, showed air space disease in the left upper lobe, left lower lobe, and right lower lobe consistent with multifocal lobar pneumonia. There were multiple rounded nodules along the fissures and sub pleural spaces, which could represent lymph nodes associated pulmonary infection. Follow-up CT thorax with contrast in 4 to 6 weeks, is recommended. Wcc has normalized at 7.8 as of 12/15/11. HIV test is non-reactive. Now on day#3 of antibiotics.  2. ARF: Patient's creatinine was 2.06 at presentation, against a known baseline creatinine of 0.91 on 05/06/11. She was managed with iv fliuds, with resolution Creatinine is 1.0 today. Metformin was briefly placed on hold, but will be restarted on 12/17/11.  Lisinopril is still on hold. (see below). 3. DM: Patient has insulin-requiring type 2 DM, which appeared sub-optimally controlled, based on random blood glucose at presentation. As described above,  Metformin was placed on hold, due to ARF. Managing with diet, SSI and Lantus, with satisfactory control. HBA1C is 6.7. 4. HTN: BP is controlled at this time. Lisinopril is on hold. Will observe.   Code Status: Full Code.  Family Communication:  Disposition Plan: Aiming possible discharge on 12/17/11. May need short-term home O2.    Brief narrative: [name] is a 56 y.o. female with history of DM, HTN, previous hernia repair, abdominal hysterectomy and left knee arthroscopy, who presents to the ED with complaints of worsening SOB X 2 days and chest pain with deep breaths over the past 24 hours, without fevers chills or cough. In the ED, she was found to have an abnormal chest X-ray and an elevated D-Dimer, she was placed on full dose Lovenox therapy and empiric antibiotic therapy for CAP Pneumonia and admitted for further evaluation and management.   Consultants:  N/A.   Procedures:  CXR.  Antibiotics:  Levaquin 12/13/11>>>  HPI/Subjective: No new issues, started coughing up phlegm last night.   Objective: Vital signs in last 24 hours: Temp:  [98.2 F (36.8 C)-98.8 F (37.1 C)] 98.2 F (36.8 C) (11/10 0600) Pulse Rate:  [68-81] 70  (11/10 0600) Resp:  [18-20] 18  (11/10 0600) BP: (101-135)/(64-82) 101/64 mmHg (11/10 0600) SpO2:  [94 %-99 %] 97 % (11/10 0835) Weight change:  Last BM Date: 12/15/11  Intake/Output from previous day: 11/09 0701 - 11/10 0700 In: 1500 [P.O.:360; I.V.:1140] Out: 450 [Urine:450]     Physical Exam: General: Alert, communicative, fully oriented, mildly short of breath on exertion.  HEENT:  No clinical pallor, no jaundice, no conjunctival injection or discharge. Hydration is satisfactory.  NECK:  Supple, JVP not seen, no carotid bruits, no palpable lymphadenopathy, no palpable goiter. CHEST:  Fine crackles right mid-zone. HEART:  Sounds 1 and 2 heard, normal, regular, no murmurs. ABDOMEN:  Moderately obese, soft, non-tender, no palpable  organomegaly, no palpable masses, normal bowel sounds. GENITALIA:  Not examined. LOWER EXTREMITIES:  No pitting edema, palpable peripheral pulses. MUSCULOSKELETAL SYSTEM:  Unremarkable. CENTRAL NERVOUS SYSTEM:  No focal neurologic deficit on gross examination.  Lab Results:  Basename 12/16/11 0435 12/15/11 0700  WBC 7.9 7.8  HGB 12.5 12.3  HCT 36.4 36.5  PLT 306 268    Basename 12/16/11 0435 12/15/11 0700  NA 133* 133*  K 3.6 3.5  CL 98 96  CO2 21 22  GLUCOSE 172* 159*  BUN 15 15  CREATININE 1.00 1.05  CALCIUM 9.6 9.3   No results found for this or any previous visit (from the past 240 hour(s)).   Studies/Results: Ct Chest W Contrast  12/15/2011  *RADIOLOGY REPORT*  Clinical Data: Short of breath, abnormal chest radiograph  CT CHEST WITH CONTRAST  Technique:  Multidetector CT imaging of the chest was performed following the standard protocol during bolus administration of intravenous contrast.  Contrast: 75mL OMNIPAQUE IOHEXOL 300 MG/ML  SOLN  Comparison: Chest radiograph 12/1968 1013, VQ scan 12/14/2011  Findings: No axillary or supraclavicular lymphadenopathy.  No mediastinal or hilar lymphadenopathy.  No pericardial fluid.  Review of the lung parenchyma demonstrates segmental air space disease in the left upper lobe, right lower lobe and left lower lobe.  This finding is most suggestive of multifocal pneumonia.  There are enlarged nodular lesions along the fissures.  For example nodules in the left upper lobe measuring 7 mm and 5 mm (image 17). Similar pleural nodule in the right upper lobe measuring 10 mm. There these may represent intrafissural/subpleural lymph nodes. Similar nodule lesion in the right lower lobe (image 32).  Limited view of the upper abdomen is unremarkable.  Limited view of the skeleton is unremarkable.  IMPRESSION:  1.  Air space disease in the left upper lobe, left lower lobe, and right lower lobe suggest multifocal  lobar pneumonia. 2.  Multiple rounded nodules  along the fissures and sub pleural spaces.  These could represent lymph nodes associated pulmonary infection.  Recommend follow-up CT thorax with contrast in 4 to 6 weeks to reevaluate these findings.   Original Report Authenticated By: Genevive Bi, M.D.    Nm Pulmonary Perf And Vent  12/14/2011  *RADIOLOGY REPORT*  Clinical Data:  Short of breath, concern pulmonary embolism.  NUCLEAR MEDICINE VENTILATION - PERFUSION LUNG SCAN  Technique:  Ventilation images were obtained in multiple projections using inhaled aerosol technetium 99 M DTPA.  Perfusion images were obtained in multiple projections after intravenous injection of Tc-48m MAA.  Radiopharmaceuticals:  Tc-22m DTPA aerosol and six mCi Tc-57m MAA.  Comparison: Chest radiograph 12/13/2011  Findings:  Ventilation:  No large ventilation defects are present.  Several small peripheral ventilation defects are present.  Perfusion:  There are no  large segmental wedge shaped perfusion defects to  suggest acute pulmonary embolism.  There are several small peripheral perfusion defects in the right upper lobe predominantly.  These are either matched or unmatched.  These likely correspond to the air space disease seen on comparison chest radiograph.  IMPRESSION:  1. Very low probability acute pulmonary embolism.  2.  Small perfusion defects likely relate to pneumonia seen on comparison exam.   Original Report Authenticated By: Genevive Bi, M.D.     Medications: Scheduled Meds:    . albuterol  2.5 mg Nebulization Q6H  .  atenolol  100 mg Oral Daily  . atorvastatin  80 mg Oral q1800  . enoxaparin (LOVENOX) injection  40 mg Subcutaneous Q24H  . guaiFENesin  600 mg Oral BID  . insulin aspart  0-5 Units Subcutaneous QHS  . insulin aspart  0-9 Units Subcutaneous TID WC  . insulin glargine  20 Units Subcutaneous Daily  . insulin glargine  25 Units Subcutaneous QHS  . levofloxacin (LEVAQUIN) IV  500 mg Intravenous Q24H  . potassium chloride  10 mEq  Oral Daily  . sodium chloride  3 mL Intravenous Q12H   Continuous Infusions:    . [DISCONTINUED] sodium chloride Stopped (12/15/11 1440)   PRN Meds:.acetaminophen, acetaminophen, albuterol, alum & mag hydroxide-simeth, guaiFENesin, HYDROmorphone (DILAUDID) injection, [COMPLETED] iohexol, ondansetron (ZOFRAN) IV, ondansetron, oxyCODONE, zolpidem    LOS: 3 days   [name],CHRISTOPHER  [facility] Pager [phone]. If 8PM-8AM, please contact night-coverage at [email or web address], password [facility] 12/16/2011, 9:02 AM  LOS: 3 days

## 2011-12-16 NOTE — Discharge Summary (Signed)
Physician Discharge Summary  [name] QIO:[id number] DOB: 11-Feb-1955 DOA: 12/13/2011  PCP: Emeterio Reeve, MD  Admit date: 12/13/2011 Discharge date: 12/17/2011  Time spent: 40 minutes  Recommendations for Outpatient Follow-up:  1. Follow up with PMD. 2. Recommended off work for 7 days.   Discharge Diagnoses:  Principal Problem:  *Pleuritic chest pain Active Problems:  SOB (shortness of breath)  Hypotension  ARF (acute renal failure)  Diabetes  Hyperlipidemia  CAD (coronary artery disease)  CAP (community acquired pneumonia)   Discharge Condition: Satisfactory.  Diet recommendation: Heart-Healthy/Cardohydrate-Modified.  Filed Weights   12/13/11 2341 12/14/11 0143 12/16/11 1100  Weight: 96.163 kg (212 lb) 92.035 kg (202 lb 14.4 oz) 91.4 kg (201 lb 8 oz)    History of present illness:  [name] is a 56 y.o. female with history of DM, HTN, previous hernia repair, abdominal hysterectomy and left knee arthroscopy, who presents to the ED with complaints of worsening SOB X 2 days and chest pain with deep breaths over the past 24 hours, without fevers chills or cough. In the ED, she was found to have an abnormal chest X-ray and an elevated D-Dimer, she was placed on full dose Lovenox therapy and empiric antibiotic therapy for CAP Pneumonia and admitted for further evaluation and management.      Hospital Course:  1. Dyspnea: Patient presented with a 2-day history of progressive SOB. CXR showed new multifocal ill-defined somewhat nodular and mass-like opacities in the lungs bilaterally, particularly in the left perihilar region. Precise etiopogy was unclear initially, but multi-focal pneumonia, neoplastic disease and pulmonary emboli were all in the differential. Patient has had no fever or chills, wcc was mildly elevated at 11.1. She was initially placed on therapeutic Lovenox, iv Levaquin, and supportive treatment. V/Q scan was reported as very low probability for acute  pulmonary embolism and Lovenox was changed to prophylactic dose, accordingly. Chest CT scan on 12/15/11, showed air space disease in the left upper lobe, left lower lobe, and right lower lobe consistent with multifocal lobar pneumonia. There were multiple rounded nodules along the fissures and sub pleural spaces, which could represent lymph nodes associated pulmonary infection. Follow-up CT thorax with contrast in 4 to 6 weeks, is recommended. Wcc has normalized at 7.8 as of 12/15/11. HIV test is non-reactive. Patient has improved clinically, and a total of 10 days antibiotic treatment is planned, to be concluded on 12/24/11. 2. ARF: Patient's creatinine was 2.06 at presentation, against a known baseline creatinine of 0.91 on 05/06/11. She was managed with iv fliuds, with resolution. Creatinine was normal at 1.0 as of 12/16/11. Metformin was briefly placed on hold, but was restarted on 12/17/11. Lisinopril is still on hold. (see below).  3. DM: Patient has insulin-requiring type 2 DM, which appeared sub-optimally controlled, based on random blood glucose at presentation. As described above, Metformin was placed on hold, due to ARF. Managed with diet, SSI and Lantus, with satisfactory control. HBA1C is 6.7.  4. HTN: BP is controlled at this time. Lisinopril is on hold, till reviewed by primary MD.    Procedures:  See below.  Consultations:  N/A.   Discharge Exam: Filed Vitals:   12/17/11 0157 12/17/11 0500 12/17/11 0800 12/17/11 0915  BP:  101/65 104/63   Pulse:  72 70 87  Temp:  97.9 F (36.6 C) 98.3 F (36.8 C)   TempSrc:  Oral Oral   Resp:  18 18   Height:      Weight:  SpO2: 96% 95% 94% 94%    General: Alert, communicative, fully oriented, mildly short of breath on exertion.  HEENT: No clinical pallor, no jaundice, no conjunctival injection or discharge. Hydration is satisfactory.  NECK: Supple, JVP not seen, no carotid bruits, no palpable lymphadenopathy, no palpable goiter.    CHEST: Fine crackles right mid-zone.  HEART: Sounds 1 and 2 heard, normal, regular, no murmurs.  ABDOMEN: Moderately obese, soft, non-tender, no palpable organomegaly, no palpable masses, normal bowel sounds.  GENITALIA: Not examined.  LOWER EXTREMITIES: No pitting edema, palpable peripheral pulses.  MUSCULOSKELETAL SYSTEM: Unremarkable.  CENTRAL NERVOUS SYSTEM: No focal neurologic deficit on gross examination.  Discharge Instructions      Discharge Orders    Future Orders Please Complete By Expires   Diet - low sodium heart healthy      Diet Carb Modified      Increase activity slowly          Medication List     As of 12/17/2011  1:42 PM    STOP taking these medications         lisinopril 40 MG tablet   Commonly known as: PRINIVIL,ZESTRIL      TAKE these medications         atenolol 100 MG tablet   Commonly known as: TENORMIN   Take 100 mg by mouth daily.      guaiFENesin 600 MG 12 hr tablet   Commonly known as: MUCINEX   Take 1 tablet (600 mg total) by mouth 2 (two) times daily.      HYDROcodone-acetaminophen 5-500 MG per tablet   Commonly known as: VICODIN   Take 1 tablet by mouth at bedtime as needed. For pain      insulin glargine 100 UNIT/ML injection   Commonly known as: LANTUS   Inject 20-25 Units into the skin 2 (two) times daily. Take 20 units in the morning and 25 units at night      insulin NPH 100 UNIT/ML injection   Commonly known as: HUMULIN N,NOVOLIN N   Inject 5-15 Units into the skin. 15 in the am and 5 at night      levofloxacin 500 MG tablet   Commonly known as: LEVAQUIN   Take 1 tablet (500 mg total) by mouth daily.      metFORMIN 500 MG tablet   Commonly known as: GLUCOPHAGE   Take 500 mg by mouth 2 (two) times daily with a meal.      potassium chloride 10 MEQ tablet   Commonly known as: K-DUR   Take 10 mEq by mouth daily.      rosuvastatin 40 MG tablet   Commonly known as: CRESTOR   Take 40 mg by mouth daily.         Follow-up Information    Schedule an appointment as soon as possible for a visit with Emeterio Reeve, MD.   Contact information:   [address] [place] [address] [phone]           The results of significant diagnostics from this hospitalization (including imaging, microbiology, ancillary and laboratory) are listed below for reference.    Significant Diagnostic Studies: Dg Chest 2 View  12/13/2011  *RADIOLOGY REPORT*  Clinical Data: Shortness of breath.  CHEST - 2 VIEW  Comparison: Chest x-ray 02/09/2008.  Findings: Lung volumes are normal.  There are multiple new ill- defined opacities enters somewhat nodular and mass-like in appearance, most pronounced in the perihilar aspect of the left  lung.  No pleural effusions.  Pulmonary vasculature appears mildly engorged, without frank pulmonary edema.  Heart size is borderline enlarged.  Mediastinal contours are within normal limits. Atherosclerosis of the thoracic aorta.  IMPRESSION: 1.  New multi focal ill-defined somewhat nodular and masslike opacities in the lungs bilaterally, particularly in the left perihilar region.  While this could certainly reflect multifocal infection, clinical correlation is recommended.  If there is a history of acute chest pain, the possibility of multifocal pulmonary embolism with areas of hemorrhage from pulmonary infarction warrants consideration.  Alternatively, if there is a history of smoking or documented history of primary malignancy, primary lung neoplasm or metastatic disease to the chest may warrant consideration.  For better evaluation of all these findings, consideration for contrast enhanced chest CT (potentially PE protocol CT scan if indicated) is recommended. 2.  Atherosclerosis.   Original Report Authenticated By: Trudie Reed, M.D.    Ct Chest W Contrast  12/15/2011  *RADIOLOGY REPORT*  Clinical Data: Short of breath, abnormal chest radiograph  CT CHEST WITH CONTRAST   Technique:  Multidetector CT imaging of the chest was performed following the standard protocol during bolus administration of intravenous contrast.  Contrast: 75mL OMNIPAQUE IOHEXOL 300 MG/ML  SOLN  Comparison: Chest radiograph 12/1968 1013, VQ scan 12/14/2011  Findings: No axillary or supraclavicular lymphadenopathy.  No mediastinal or hilar lymphadenopathy.  No pericardial fluid.  Review of the lung parenchyma demonstrates segmental air space disease in the left upper lobe, right lower lobe and left lower lobe.  This finding is most suggestive of multifocal pneumonia.  There are enlarged nodular lesions along the fissures.  For example nodules in the left upper lobe measuring 7 mm and 5 mm (image 17). Similar pleural nodule in the right upper lobe measuring 10 mm. There these may represent intrafissural/subpleural lymph nodes. Similar nodule lesion in the right lower lobe (image 32).  Limited view of the upper abdomen is unremarkable.  Limited view of the skeleton is unremarkable.  IMPRESSION:  1.  Air space disease in the left upper lobe, left lower lobe, and right lower lobe suggest multifocal  lobar pneumonia. 2.  Multiple rounded nodules along the fissures and sub pleural spaces.  These could represent lymph nodes associated pulmonary infection.  Recommend follow-up CT thorax with contrast in 4 to 6 weeks to reevaluate these findings.   Original Report Authenticated By: Genevive Bi, M.D.    Nm Pulmonary Perf And Vent  12/14/2011  *RADIOLOGY REPORT*  Clinical Data:  Short of breath, concern pulmonary embolism.  NUCLEAR MEDICINE VENTILATION - PERFUSION LUNG SCAN  Technique:  Ventilation images were obtained in multiple projections using inhaled aerosol technetium 99 M DTPA.  Perfusion images were obtained in multiple projections after intravenous injection of Tc-57m MAA.  Radiopharmaceuticals:  Tc-6m DTPA aerosol and six mCi Tc-20m MAA.  Comparison: Chest radiograph 12/13/2011  Findings:   Ventilation:  No large ventilation defects are present.  Several small peripheral ventilation defects are present.  Perfusion:  There are no  large segmental wedge shaped perfusion defects to  suggest acute pulmonary embolism.  There are several small peripheral perfusion defects in the right upper lobe predominantly.  These are either matched or unmatched.  These likely correspond to the air space disease seen on comparison chest radiograph.  IMPRESSION:  1. Very low probability acute pulmonary embolism.  2.  Small perfusion defects likely relate to pneumonia seen on comparison exam.   Original Report Authenticated By: Genevive Bi, M.D.  Microbiology: No results found for this or any previous visit (from the past 240 hour(s)).   Labs: Basic Metabolic Panel:  Lab 12/17/11 1610 12/16/11 0435 12/15/11 0700 12/14/11 0547 12/13/11 1616  NA 131* 133* 133* 133* 132*  K 3.8 3.6 3.5 3.9 3.3*  CL 94* 98 96 98 93*  CO2 21 21 22 25 24   GLUCOSE 244* 172* 159* 135* 151*  BUN 16 15 15 19 20   CREATININE 0.92 1.00 1.05 1.47* 2.06*  CALCIUM 9.7 9.6 9.3 9.2 9.7  MG -- -- -- -- --  PHOS -- -- -- -- --   Liver Function Tests:  Lab 12/15/11 0700  AST 21  ALT 16  ALKPHOS 107  BILITOT 0.5  PROT 7.9  ALBUMIN 3.1*   No results found for this basename: LIPASE:5,AMYLASE:5 in the last 168 hours No results found for this basename: AMMONIA:5 in the last 168 hours CBC:  Lab 12/17/11 0506 12/16/11 0435 12/15/11 0700 12/14/11 0547 12/13/11 1616  WBC 7.4 7.9 7.8 11.1* 11.2*  NEUTROABS -- -- -- -- 8.0*  HGB 12.4 12.5 12.3 13.0 13.5  HCT 35.8* 36.4 36.5 37.6 38.2  MCV 80.1 80.4 81.1 81.6 80.9  PLT 363 306 268 252 239   Cardiac Enzymes:  Lab 12/14/11 1338 12/14/11 0547 12/14/11 0035 12/13/11 1617  CKTOTAL -- -- -- --  CKMB -- -- -- --  CKMBINDEX -- -- -- --  TROPONINI <0.30 <0.30 <0.30 <0.30   BNP: BNP (last 3 results) No results found for this basename: PROBNP:3 in the last 8760  hours CBG:  Lab 12/17/11 1128 12/17/11 0732 12/16/11 2100 12/16/11 1646 12/16/11 1127  GLUCAP 154* 176* 177* 215* 192*       Signed:  Loriel Diehl,CHRISTOPHER  [facility] 12/17/2011, 1:42 PM

## 2011-12-17 LAB — GLUCOSE, CAPILLARY: Glucose-Capillary: 176 mg/dL — ABNORMAL HIGH (ref 70–99)

## 2011-12-17 LAB — CBC
Hemoglobin: 12.4 g/dL (ref 12.0–15.0)
MCH: 27.7 pg (ref 26.0–34.0)
Platelets: 363 10*3/uL (ref 150–400)
RBC: 4.47 MIL/uL (ref 3.87–5.11)
WBC: 7.4 10*3/uL (ref 4.0–10.5)

## 2011-12-17 LAB — BASIC METABOLIC PANEL
CO2: 21 mEq/L (ref 19–32)
Calcium: 9.7 mg/dL (ref 8.4–10.5)
Chloride: 94 mEq/L — ABNORMAL LOW (ref 96–112)
Potassium: 3.8 mEq/L (ref 3.5–5.1)
Sodium: 131 mEq/L — ABNORMAL LOW (ref 135–145)

## 2011-12-17 MED ORDER — LEVOFLOXACIN 500 MG PO TABS: 500.0000 mg | ORAL_TABLET | Freq: Every day | ORAL | Status: DC

## 2011-12-17 MED ORDER — GUAIFENESIN ER 600 MG PO TB12: 600.0000 mg | ORAL_TABLET | Freq: Two times a day (BID) | ORAL | Status: DC

## 2011-12-17 NOTE — Progress Notes (Signed)
Pt ready for d/c. Went over d/c instructions, new medications. Instructed to stop taking lisopril, until a visit with PCP. Instructed to increase activity slowly. D/c'd O2. Removed PIV, WNL. Pt d/c'd to home with daughter.

## 2011-12-17 NOTE — Evaluation (Signed)
Physical Therapy Evaluation Patient Details Name: [name] MRN: [id number] DOB: 03-31-1955 Today's Date: 12/17/2011 Time: 3474-2595 PT Time Calculation (min): 12 min  PT Assessment / Plan / Recommendation Clinical Impression  Pt. was admitted on 11/7 with SOB , abnormal CT showed pneumonia and nodules. Pt ambulated x 120 ft on RA with sats dropping initially to 88% briefly but maintained >91% during ambulation. noted dyspnea. Pt. lives in 2 level home and has family available 24/7. Pt. may benefit from further ambulation by RN on RA to determine qualification for home O2. no further PT is indicated at this time.pt is independent .    PT Assessment       Follow Up Recommendations  No PT follow up    Does the patient have the potential to tolerate intense rehabilitation      Barriers to Discharge        Equipment Recommendations  None recommended by PT    Recommendations for Other Services     Frequency      Precautions / Restrictions Precautions Precaution Comments: dyspnea   Pertinent Vitals/Pain Pre sats 96% on 2 l. During ambulation sats dropped to 88% only very briefly on RA. sats were >91% throughout after first drop.      Mobility  Bed Mobility Bed Mobility: Supine to Sit;Sit to Supine Supine to Sit: 7: Independent Sit to Supine: 7: Independent Transfers Transfers: Sit to Stand;Stand to Sit Sit to Stand: 7: Independent Stand to Sit: 7: Independent Ambulation/Gait Ambulation/Gait Assistance: 5: Supervision Ambulation Distance (Feet): 120 Feet Assistive device: None Ambulation/Gait Assistance Details: pt ambulates with no external support. Pt. did stop and take 3 breaks to catch breath. instructed in pursed lip breathing. Gait Pattern: Step-through pattern Gait velocity: decreased    Shoulder Instructions     Exercises     PT Diagnosis:    PT Problem List:   PT Treatment Interventions:     PT Goals    Visit Information  Last PT Received On:  12/17/11 Assistance Needed: +1    Subjective Data  Subjective: i am just so tired. Patient Stated Goal: to return home   Prior Functioning  Home Living Lives With: Family Available Help at Discharge: Family;Available 24 hours/day Type of Home: Apartment Home Layout: Two level;Bed/bath upstairs Alternate Level Stairs-Number of Steps: 15 Alternate Level Stairs-Rails: Right;Left Bathroom Shower/Tub: Engineer, manufacturing systems: Standard Home Adaptive Equipment: None Prior Function Level of Independence: Independent Able to Take Stairs?: Yes Driving: Yes Vocation: Full time employment Communication Communication: No difficulties    Cognition  Overall Cognitive Status: Appears within functional limits for tasks assessed/performed Arousal/Alertness: Awake/alert Orientation Level: Appears intact for tasks assessed Behavior During Session: [facility] for tasks performed    Extremity/Trunk Assessment Right Upper Extremity Assessment RUE ROM/Strength/Tone: WFL for tasks assessed RUE Sensation: History of peripheral neuropathy Left Upper Extremity Assessment LUE ROM/Strength/Tone: WFL for tasks assessed LUE Sensation: History of peripheral neuropathy Right Lower Extremity Assessment RLE ROM/Strength/Tone: WFL for tasks assessed RLE Sensation: History of peripheral neuropathy;Deficits RLE Sensation Deficits: reports that feet are numb Left Lower Extremity Assessment LLE ROM/Strength/Tone: WFL for tasks assessed LLE Sensation: History of peripheral neuropathy;Deficits LLE Sensation Deficits: same as RLE Trunk Assessment Trunk Assessment: Normal   Balance    End of Session PT - End of Session Activity Tolerance: Patient limited by fatigue (limited by dyspnea) Patient left: in bed;with call bell/phone within reach Nurse Communication: Mobility status (sats on RA)  GP     Wilder Kurowski, Jobe Igo  12/17/2011, 9:29 AM  [phone]

## 2012-01-02 ENCOUNTER — Other Ambulatory Visit: Payer: Self-pay | Admitting: Family Medicine

## 2012-01-02 DIAGNOSIS — R918 Other nonspecific abnormal finding of lung field: Secondary | ICD-10-CM

## 2012-01-11 ENCOUNTER — Ambulatory Visit
Admission: RE | Admit: 2012-01-11 | Discharge: 2012-01-11 | Disposition: A | Discharge status: Home / Self Care | Payer: Managed Care, Other (non HMO) | Source: Ambulatory Visit | Attending: Family Medicine | Admitting: Family Medicine

## 2012-01-11 DIAGNOSIS — R918 Other nonspecific abnormal finding of lung field: Secondary | ICD-10-CM

## 2012-01-11 MED ORDER — IOHEXOL 300 MG/ML  SOLN
50.0000 mL | Freq: Once | INTRAMUSCULAR | Status: AC | PRN
  Administered 2012-01-11: 50 mL via INTRAVENOUS

## 2012-02-08 ENCOUNTER — Inpatient Hospital Stay (HOSPITAL_COMMUNITY)
Admission: EM | Admit: 2012-02-08 | Discharge: 2012-02-15 | DRG: 389 | Disposition: A | Discharge status: Home / Self Care | Payer: Managed Care, Other (non HMO) | Attending: Internal Medicine | Admitting: Internal Medicine

## 2012-02-08 ENCOUNTER — Emergency Department (HOSPITAL_COMMUNITY): Payer: Managed Care, Other (non HMO)

## 2012-02-08 ENCOUNTER — Encounter (HOSPITAL_COMMUNITY): Payer: Self-pay | Admitting: Emergency Medicine

## 2012-02-08 DIAGNOSIS — Z6835 Body mass index (BMI) 35.0-35.9, adult: Secondary | ICD-10-CM

## 2012-02-08 DIAGNOSIS — E785 Hyperlipidemia, unspecified: Secondary | ICD-10-CM

## 2012-02-08 DIAGNOSIS — E669 Obesity, unspecified: Secondary | ICD-10-CM | POA: Diagnosis present

## 2012-02-08 DIAGNOSIS — Z8249 Family history of ischemic heart disease and other diseases of the circulatory system: Secondary | ICD-10-CM

## 2012-02-08 DIAGNOSIS — Z794 Long term (current) use of insulin: Secondary | ICD-10-CM

## 2012-02-08 DIAGNOSIS — K7689 Other specified diseases of liver: Secondary | ICD-10-CM | POA: Diagnosis present

## 2012-02-08 DIAGNOSIS — M47817 Spondylosis without myelopathy or radiculopathy, lumbosacral region: Secondary | ICD-10-CM | POA: Diagnosis present

## 2012-02-08 DIAGNOSIS — K56609 Unspecified intestinal obstruction, unspecified as to partial versus complete obstruction: Secondary | ICD-10-CM | POA: Diagnosis present

## 2012-02-08 DIAGNOSIS — N179 Acute kidney failure, unspecified: Secondary | ICD-10-CM | POA: Diagnosis present

## 2012-02-08 DIAGNOSIS — E860 Dehydration: Secondary | ICD-10-CM | POA: Diagnosis present

## 2012-02-08 DIAGNOSIS — D638 Anemia in other chronic diseases classified elsewhere: Secondary | ICD-10-CM | POA: Diagnosis present

## 2012-02-08 DIAGNOSIS — K565 Intestinal adhesions [bands], unspecified as to partial versus complete obstruction: Principal | ICD-10-CM | POA: Diagnosis present

## 2012-02-08 DIAGNOSIS — Z90710 Acquired absence of both cervix and uterus: Secondary | ICD-10-CM

## 2012-02-08 DIAGNOSIS — I252 Old myocardial infarction: Secondary | ICD-10-CM

## 2012-02-08 DIAGNOSIS — J189 Pneumonia, unspecified organism: Secondary | ICD-10-CM

## 2012-02-08 DIAGNOSIS — E876 Hypokalemia: Secondary | ICD-10-CM | POA: Diagnosis present

## 2012-02-08 DIAGNOSIS — E119 Type 2 diabetes mellitus without complications: Secondary | ICD-10-CM | POA: Diagnosis present

## 2012-02-08 DIAGNOSIS — Z79899 Other long term (current) drug therapy: Secondary | ICD-10-CM

## 2012-02-08 DIAGNOSIS — Z8719 Personal history of other diseases of the digestive system: Secondary | ICD-10-CM

## 2012-02-08 DIAGNOSIS — Z9049 Acquired absence of other specified parts of digestive tract: Secondary | ICD-10-CM

## 2012-02-08 DIAGNOSIS — R0781 Pleurodynia: Secondary | ICD-10-CM

## 2012-02-08 DIAGNOSIS — Z880 Allergy status to penicillin: Secondary | ICD-10-CM

## 2012-02-08 DIAGNOSIS — R0602 Shortness of breath: Secondary | ICD-10-CM

## 2012-02-08 DIAGNOSIS — I959 Hypotension, unspecified: Secondary | ICD-10-CM

## 2012-02-08 DIAGNOSIS — Z888 Allergy status to other drugs, medicaments and biological substances status: Secondary | ICD-10-CM

## 2012-02-08 DIAGNOSIS — I2510 Atherosclerotic heart disease of native coronary artery without angina pectoris: Secondary | ICD-10-CM

## 2012-02-08 DIAGNOSIS — I10 Essential (primary) hypertension: Secondary | ICD-10-CM | POA: Diagnosis present

## 2012-02-08 DIAGNOSIS — G8929 Other chronic pain: Secondary | ICD-10-CM | POA: Diagnosis present

## 2012-02-08 HISTORY — DX: Unspecified intestinal obstruction, unspecified as to partial versus complete obstruction: K56.609

## 2012-02-08 LAB — CBC WITH DIFFERENTIAL/PLATELET
Basophils Absolute: 0 10*3/uL (ref 0.0–0.1)
Basophils Relative: 0 % (ref 0–1)
Eosinophils Relative: 4 % (ref 0–5)
HCT: 39.5 % (ref 36.0–46.0)
MCHC: 34.2 g/dL (ref 30.0–36.0)
Monocytes Absolute: 0.4 10*3/uL (ref 0.1–1.0)
Neutro Abs: 3.5 10*3/uL (ref 1.7–7.7)
Platelets: 270 10*3/uL (ref 150–400)
RDW: 13.9 % (ref 11.5–15.5)

## 2012-02-08 LAB — LACTIC ACID, PLASMA: Lactic Acid, Venous: 2.1 mmol/L (ref 0.5–2.2)

## 2012-02-08 LAB — COMPREHENSIVE METABOLIC PANEL
AST: 44 U/L — ABNORMAL HIGH (ref 0–37)
Albumin: 3.8 g/dL (ref 3.5–5.2)
Calcium: 9.4 mg/dL (ref 8.4–10.5)
Chloride: 101 mEq/L (ref 96–112)
Creatinine, Ser: 1.13 mg/dL — ABNORMAL HIGH (ref 0.50–1.10)

## 2012-02-08 LAB — URINALYSIS, MICROSCOPIC ONLY
Glucose, UA: NEGATIVE mg/dL
Leukocytes, UA: NEGATIVE
pH: 5.5 (ref 5.0–8.0)

## 2012-02-08 MED ORDER — HYDROMORPHONE HCL PF 1 MG/ML IJ SOLN
1.0000 mg | Freq: Once | INTRAMUSCULAR | Status: AC
  Administered 2012-02-08: 1 mg via INTRAVENOUS
  Filled 2012-02-08: qty 1

## 2012-02-08 MED ORDER — ONDANSETRON HCL 4 MG/2ML IJ SOLN
4.0000 mg | Freq: Once | INTRAMUSCULAR | Status: AC
  Administered 2012-02-08: 4 mg via INTRAVENOUS
  Filled 2012-02-08: qty 2

## 2012-02-08 MED ORDER — IOHEXOL 300 MG/ML  SOLN
100.0000 mL | Freq: Once | INTRAMUSCULAR | Status: AC | PRN
  Administered 2012-02-08: 100 mL via INTRAVENOUS

## 2012-02-08 MED ORDER — DIPHENHYDRAMINE HCL 50 MG/ML IJ SOLN
25.0000 mg | Freq: Once | INTRAMUSCULAR | Status: AC
  Administered 2012-02-08: 25 mg via INTRAVENOUS
  Filled 2012-02-08: qty 1

## 2012-02-08 MED ORDER — HYDROMORPHONE HCL PF 1 MG/ML IJ SOLN
1.0000 mg | INTRAMUSCULAR | Status: DC | PRN
  Administered 2012-02-09 (×2): 2 mg via INTRAVENOUS
  Administered 2012-02-09: 1 mg via INTRAVENOUS
  Administered 2012-02-09 – 2012-02-10 (×9): 2 mg via INTRAVENOUS
  Administered 2012-02-11 (×3): 1 mg via INTRAVENOUS
  Administered 2012-02-11 (×2): 2 mg via INTRAVENOUS
  Administered 2012-02-11: 1 mg via INTRAVENOUS
  Administered 2012-02-12 – 2012-02-13 (×10): 2 mg via INTRAVENOUS
  Filled 2012-02-08: qty 2
  Filled 2012-02-08: qty 1
  Filled 2012-02-08 (×3): qty 2
  Filled 2012-02-08 (×2): qty 1
  Filled 2012-02-08: qty 2
  Filled 2012-02-08: qty 1
  Filled 2012-02-08 (×9): qty 2
  Filled 2012-02-08: qty 1
  Filled 2012-02-08 (×5): qty 2
  Filled 2012-02-08: qty 1
  Filled 2012-02-08 (×2): qty 2
  Filled 2012-02-08: qty 1
  Filled 2012-02-08: qty 2

## 2012-02-08 NOTE — H&P (Signed)
PCP:   Emeterio Reeve, MD    Chief Complaint:   Abdominal pain  HPI: [name] is a 57 y.o. female   has a past medical history of Diabetes mellitus; Hypertension; Myocardial infarction (2000); and Chronic pain following surgery or procedure (2008).   Presented with  Since this am she developed severe abdominal pain and distention with nausea but no vomiting. Last BM was this AM. Her last episode of SBO was about 2 years ago but she have had them very frequently. She had to be operated once by Dr. Michaell Cowing 5 years ago but otherwise she has recovered with conservative management.  At her baseline she has a lot of foot pain and so she does use some Vicodin frequently.  Review of Systems:     Pertinent positives include: abdominal pain, nausea  Constitutional:  No weight loss, night sweats, Fevers, chills, fatigue, weight loss  HEENT:  No headaches, Difficulty swallowing,Tooth/dental problems,Sore throat,  No sneezing, itching, ear ache, nasal congestion, post nasal drip,  Cardio-vascular:  No chest pain, Orthopnea, PND, anasarca, dizziness, palpitations.no Bilateral lower extremity swelling  GI:  No heartburn, indigestion, vomiting, diarrhea, change in bowel habits, loss of appetite, melena, blood in stool, hematemesis Resp:  no shortness of breath at rest. No dyspnea on exertion, No excess mucus, no productive cough, No non-productive cough, No coughing up of blood.No change in color of mucus.No wheezing. Skin:  no rash or lesions. No jaundice GU:  no dysuria, change in color of urine, no urgency or frequency. No straining to urinate.  No flank pain.  Musculoskeletal:  No joint pain or no joint swelling. No decreased range of motion. No back pain.  Psych:  No change in mood or affect. No depression or anxiety. No memory loss.  Neuro: no localizing neurological complaints, no tingling, no weakness, no double vision, no gait abnormality, no slurred speech, no  confusion  Otherwise ROS are negative except for above, 10 systems were reviewed  Past Medical History: Past Medical History  Diagnosis Date  . Diabetes mellitus   . Hypertension   . Myocardial infarction 2000  . Chronic pain following surgery or procedure 2008    Abdominal pain   Past Surgical History  Procedure Date  . Hernia repair 2008  . Abdominal hysterectomy 1999    partial  . Abdominal hysterectomy 2000    complete  . Cesarean section      X  2  . Knee arthroscopy     Left  . Abdominal surgery   . Bowel obstruction      Medications: Prior to Admission medications   Medication Sig Start Date End Date Taking? Authorizing Provider  atenolol (TENORMIN) 100 MG tablet Take 100 mg by mouth daily.   Yes Historical Provider, MD  HYDROcodone-acetaminophen (VICODIN) 5-500 MG per tablet Take 1 tablet by mouth at bedtime as needed. For pain   Yes Historical Provider, MD  insulin NPH (HUMULIN N,NOVOLIN N) 100 UNIT/ML injection Inject 5-15 Units into the skin. 15 in the am and 5 at night   Yes Historical Provider, MD  metFORMIN (GLUCOPHAGE) 500 MG tablet Take 500 mg by mouth 2 (two) times daily with a meal.   Yes Historical Provider, MD  potassium chloride (K-DUR) 10 MEQ tablet Take 10 mEq by mouth daily.   Yes Historical Provider, MD  rosuvastatin (CRESTOR) 40 MG tablet Take 40 mg by mouth daily.   Yes Historical Provider, MD    Allergies:   Allergies  Allergen  Reactions  . Cymbalta (Duloxetine Hcl) Other (See Comments)    dizzy  . Penicillins Other (See Comments)    unknown  . Tramadol     unknown  . Trazodone And Nefazodone     Social History:  Ambulatory   independently   Lives at  home   reports that she has never smoked. She does not have any smokeless tobacco history on file. She reports that she drinks alcohol. She reports that she does not use illicit drugs.   Family History: family history includes CAD in her brother and sister and Hypertension in an  unspecified family member.    Physical Exam: Patient Vitals for the past 24 hrs:  BP Temp Temp src Pulse Resp SpO2  02/08/12 2105 146/81 mmHg - - 64  - 90 %  02/08/12 2041 146/81 mmHg - - 62  - 95 %  02/08/12 1809 121/73 mmHg 97.9 F (36.6 C) Oral 61  21  98 %    1. General:  in No Acute distress 2. Psychological: Alert and Oriented 3. Head/ENT:     Dry Mucous Membranes                          Head Non traumatic, neck supple                          Normal  Dentition 4. SKIN:   decreased Skin turgor,  Skin clean Dry and intact no rash 5. Heart: Regular rate and rhythm no Murmur, Rub or gallop 6. Lungs: Clear to auscultation bilaterally, no wheezes or crackles   7. Abdomen: generalized tenderness, some distention, decreased bowel sounds NG present with about 400 mL of gastric contents in the canister. 8. Lower extremities: no clubbing, cyanosis, or edema 9. Neurologically Grossly intact, moving all 4 extremities equally 10. MSK: Normal range of motion  body mass index is unknown because there is no height or weight on file.   Labs on Admission:   [facility] 02/08/12 1730  NA 137  K 4.2  CL 101  CO2 22  GLUCOSE 228*  BUN 27*  CREATININE 1.13*  CALCIUM 9.4  MG --  PHOS --    Basename 02/08/12 1730  AST 44*  ALT 40*  ALKPHOS 113  BILITOT 0.5  PROT 8.2  ALBUMIN 3.8    Basename 02/08/12 1730  LIPASE 33  AMYLASE --    Basename 02/08/12 1730  WBC 6.2  NEUTROABS 3.5  HGB 13.5  HCT 39.5  MCV 80.9  PLT 270   No results found for this basename: CKTOTAL:3,CKMB:3,CKMBINDEX:3,TROPONINI:3 in the last 72 hours No results found for this basename: TSH,T4TOTAL,FREET3,T3FREE,THYROIDAB in the last 72 hours No results found for this basename: VITAMINB12:2,FOLATE:2,FERRITIN:2,TIBC:2,IRON:2,RETICCTPCT:2 in the last 72 hours   UA no evidence of infection   Cultures:    Component Value Date/Time   SDES URINE, CLEAN CATCH 04/05/2010 2258   SPECREQUEST NONE 04/05/2010  2258   CULT Multiple bacterial morphotypes present, none predominant. Suggest appropriate recollection if clinically indicated. 04/05/2010 2258   REPTSTATUS 04/06/2010 FINAL 04/05/2010 2258       Radiological Exams on Admission: Ct Abdomen Pelvis W Contrast  02/08/2012  *RADIOLOGY REPORT*  Clinical Data: Abdominal pain, nausea, vomiting and diarrhea. History of small bowel obstruction.  The patient reports similar symptoms at the time of previous obstruction  CT ABDOMEN AND PELVIS WITH CONTRAST  Technique:  Multidetector CT imaging of the  abdomen and pelvis was performed following the standard protocol during bolus administration of intravenous contrast.  Contrast: OMNIPAQUE IOHEXOL 300 MG/ML  SOLN  Comparison: Radiographs obtained earlier today.  CT dated 10/10/2011.  Findings: Diffuse low density of the liver relative to the spleen is again demonstrated.  Interval dilated, fluid-filled loops of small bowel in the pelvis and mid and lower abdomen, with fecalization of some of the small bowel contents.  The small bowel tapers to a more normal caliber just proximal to a small bowel to the small bowel anastomosis in the upper right pelvis.  There is focal small bowel dilatation and fecalization of contents at the location of the anastomosis.  The small bowel loop extending from the region of the anastomosis is minimally dilated and tapers to a normal caliber distally.  No colonic dilatation is seen.  There are scattered colonic diverticula without evidence of diverticulitis.  No evidence of appendicitis.  Small amount of free peritoneal fluid in the pelvis.  Small accessory splenule.  The spleen, pancreas, gallbladder, adrenal glands, kidneys and urinary bladder are unremarkable.  Surgically absent uterus.  No adnexal masses or enlarged lymph nodes.  Hernia repair mesh.  A 4 mm subpleural nodular density in the left lower lobe on image number 1 is unchanged since 04/03/2009, compatible with a benign  process.  Minimal linear atelectasis or scarring is noted at the right lung base.  Bilateral L5 pars interarticularis defects with associated grade 1 anterolisthesis at the L5-S1 level.  There is also moderate to marked disc space narrowing with a vacuum phenomenon, discogenic sclerosis and anterior spur formation at that level.  Milder degenerative changes elsewhere in the lumbar and lower thoracic spine.  IMPRESSION:  1.  Small bowel obstruction to the level of the right pelvis in the region of a small bowel to small bowel anastomosis.  This could be due to an adhesion or anastamotic scarring.  2.  Hepatic steatosis. 3.  Colonic diverticulosis. 4.  Stable bilateral L5 spondylolysis and associated grade 1 spondylolisthesis at the L5-S1 level.   Original Report Authenticated By: Beckie Salts, M.D.    Dg Abd Acute W/chest  02/08/2012  *RADIOLOGY REPORT*  Clinical Data: Diffuse abdominal pain, nausea and diarrhea.  ACUTE ABDOMEN SERIES (ABDOMEN 2 VIEW & CHEST 1 VIEW)  Comparison: Previous examinations.  Findings: Normal sized heart.  Diffusely prominent interstitial markings.  Small nodular density in the lateral aspect of the right upper lung zone without significant change since 12/13/2011.  This corresponds to a focal calcified pleural plaque or rib exostosis on the CT dated 01/11/2012. Normal bowel gas pattern without free peritoneal air.  Hernia fixation markers overlying the upper pelvis and lower abdomen.  Changes of osteitis pubis.  IMPRESSION:  1.  No acute abnormality. 2.  Chronic interstitial lung disease.   Original Report Authenticated By: Beckie Salts, M.D.     Chart has been reviewed  Assessment/Plan  57 year old female here with small bowel obstruction likely due to adhesions with significant abdominal pain normal lactic acid.  Present on Admission:  . SBO (small bowel obstruction) - for it now conservative management,  NG tube has been placed. If no improvement will need surgical consult.  Ordered KUB in a.m. Will need serial abdominal exams. Pain control. Will make patient N.p.o.  . Diabetes - sliding scale and hold off on her insulin while she is n.p.o.   Prophylaxis: SCD  Protonix  CODE STATUS:FULL CODE  Other plan as per orders.  I have  spent a total of 55 min on this admission  Keidy Thurgood 02/08/2012, 10:36 PM

## 2012-02-08 NOTE — ED Notes (Signed)
Pt requesting pain meds.  Notified Topher, Charity fundraiser

## 2012-02-08 NOTE — ED Provider Notes (Signed)
History     CSN: [id number]  Arrival date & time 02/08/12  1605   First MD Initiated Contact with Patient 02/08/12 1714      Chief Complaint  Patient presents with  . Abdominal Pain  . r/o bowel obstruction    (Consider location/radiation/quality/duration/timing/severity/associated sxs/prior treatment) Patient is a 57 y.o. female presenting with abdominal pain. The history is provided by the patient.  Abdominal Pain The primary symptoms of the illness include abdominal pain.   patient here with abdominal pain that started this morning which is consistent with her prior history of bowel obstruction. She's had nausea but no vomiting. Denies any fever or diarrhea. Pain is diffuse in nature and described as sharp. Nothing makes it better or worse. No medications used prior to arrival  Past Medical History  Diagnosis Date  . Diabetes mellitus   . Hypertension   . Myocardial infarction 2000  . Chronic pain following surgery or procedure 2008    Abdominal pain    Past Surgical History  Procedure Date  . Hernia repair 2008  . Abdominal hysterectomy 1999    partial  . Abdominal hysterectomy 2000    complete  . Cesarean section      X  2  . Knee arthroscopy     Left  . Abdominal surgery   . Bowel obstruction     Family History  Problem Relation Age of Onset  . CAD Brother      X 2  . CAD Sister      X 1  . Hypertension      ALL    History  Substance Use Topics  . Smoking status: Never Smoker   . Smokeless tobacco: Not on file  . Alcohol Use: Yes     Comment: rarely    OB History    Grav Para Term Preterm Abortions TAB SAB Ect Mult Living                  Review of Systems  Gastrointestinal: Positive for abdominal pain.  All other systems reviewed and are negative.    Allergies  Cymbalta; Penicillins; Tramadol; and Trazodone and nefazodone  Home Medications   Current Outpatient Rx  Name  Route  Sig  Dispense  Refill  . ATENOLOL 100 MG PO TABS  Oral   Take 100 mg by mouth daily.         Marland Kitchen HYDROCODONE-ACETAMINOPHEN 5-500 MG PO TABS   Oral   Take 1 tablet by mouth at bedtime as needed. For pain         . INSULIN ISOPHANE HUMAN 100 UNIT/ML [place] SUSP   Subcutaneous   Inject 5-15 Units into the skin. 15 in the am and 5 at night         . LEVOFLOXACIN 500 MG PO TABS   Oral   Take 1 tablet (500 mg total) by mouth daily.   7 tablet   0   . METFORMIN HCL 500 MG PO TABS   Oral   Take 500 mg by mouth 2 (two) times daily with a meal.         . POTASSIUM CHLORIDE ER 10 MEQ PO TBCR   Oral   Take 10 mEq by mouth daily.         Marland Kitchen ROSUVASTATIN CALCIUM 40 MG PO TABS   Oral   Take 40 mg by mouth daily.           There were no vitals taken  for this visit.  Physical Exam  Nursing note and vitals reviewed. Constitutional: She is oriented to person, place, and time. She appears well-developed and well-nourished.  Non-toxic appearance. No distress.  HENT:  Head: Normocephalic and atraumatic.  Eyes: Conjunctivae normal, EOM and lids are normal. Pupils are equal, round, and reactive to light.  Neck: Normal range of motion. Neck supple. No tracheal deviation present. No mass present.  Cardiovascular: Normal rate, regular rhythm and normal heart sounds.  Exam reveals no gallop.   No murmur heard. Pulmonary/Chest: Effort normal and breath sounds normal. No stridor. No respiratory distress. She has no decreased breath sounds. She has no wheezes. She has no rhonchi. She has no rales.  Abdominal: Soft. Normal appearance and bowel sounds are normal. She exhibits no distension. There is generalized tenderness. There is no rebound, no guarding and no CVA tenderness.  Musculoskeletal: Normal range of motion. She exhibits no edema and no tenderness.  Neurological: She is alert and oriented to person, place, and time. She has normal strength. No cranial nerve deficit or sensory deficit. GCS eye subscore is 4. GCS verbal subscore is 5. GCS  motor subscore is 6.  Skin: Skin is warm and dry. No abrasion and no rash noted.  Psychiatric: She has a normal mood and affect. Her speech is normal and behavior is normal.    ED Course  Procedures (including critical care time)   Labs Reviewed  CBC WITH DIFFERENTIAL  COMPREHENSIVE METABOLIC PANEL  LIPASE, BLOOD  URINALYSIS, MICROSCOPIC ONLY   No results found.   No diagnosis found.    MDM  Patient given IV fluids and pain medication here. Does also give anti-medics. NG tube was placed. She'll be admitted by medicine service        Toy Baker, MD 02/08/12 2223

## 2012-02-08 NOTE — ED Notes (Signed)
Pt presenting to ed with c/o r/o small bowel obstruction. Pt state she has history of bowel obstructions and pain feels the same. Pt states nausea, vomiting and diarrhea onset this morning.

## 2012-02-08 NOTE — ED Notes (Signed)
Spoke with Liborio Nixon in [place] who explained that pt was itching profusely and thus they would not be able to take her.  Notified Topher, Charity fundraiser.

## 2012-02-08 NOTE — ED Notes (Signed)
Pt finished contrast.  Notified Topher RN

## 2012-02-09 ENCOUNTER — Inpatient Hospital Stay (HOSPITAL_COMMUNITY): Payer: Managed Care, Other (non HMO)

## 2012-02-09 DIAGNOSIS — N179 Acute kidney failure, unspecified: Secondary | ICD-10-CM | POA: Diagnosis present

## 2012-02-09 DIAGNOSIS — K56609 Unspecified intestinal obstruction, unspecified as to partial versus complete obstruction: Secondary | ICD-10-CM

## 2012-02-09 DIAGNOSIS — E669 Obesity, unspecified: Secondary | ICD-10-CM | POA: Diagnosis present

## 2012-02-09 DIAGNOSIS — E860 Dehydration: Secondary | ICD-10-CM | POA: Diagnosis present

## 2012-02-09 HISTORY — DX: Dehydration: E86.0

## 2012-02-09 LAB — LACTIC ACID, PLASMA: Lactic Acid, Venous: 2.6 mmol/L — ABNORMAL HIGH (ref 0.5–2.2)

## 2012-02-09 LAB — COMPREHENSIVE METABOLIC PANEL
ALT: 44 U/L — ABNORMAL HIGH (ref 0–35)
AST: 49 U/L — ABNORMAL HIGH (ref 0–37)
CO2: 22 mEq/L (ref 19–32)
Chloride: 99 mEq/L (ref 96–112)
GFR calc non Af Amer: 54 mL/min — ABNORMAL LOW (ref >90)
Potassium: 4.5 mEq/L (ref 3.5–5.1)
Sodium: 134 mEq/L — ABNORMAL LOW (ref 135–145)
Total Bilirubin: 0.5 mg/dL (ref 0.3–1.2)

## 2012-02-09 LAB — GLUCOSE, CAPILLARY
Glucose-Capillary: 111 mg/dL — ABNORMAL HIGH (ref 70–99)
Glucose-Capillary: 135 mg/dL — ABNORMAL HIGH (ref 70–99)
Glucose-Capillary: 135 mg/dL — ABNORMAL HIGH (ref 70–99)
Glucose-Capillary: 153 mg/dL — ABNORMAL HIGH (ref 70–99)
Glucose-Capillary: 198 mg/dL — ABNORMAL HIGH (ref 70–99)

## 2012-02-09 LAB — HEMOGLOBIN A1C: Hgb A1c MFr Bld: 8 % — ABNORMAL HIGH (ref <5.7)

## 2012-02-09 LAB — TSH: TSH: 5.211 u[IU]/mL — ABNORMAL HIGH (ref 0.350–4.500)

## 2012-02-09 LAB — CBC
Hemoglobin: 12.8 g/dL (ref 12.0–15.0)
MCH: 27.1 pg (ref 26.0–34.0)
MCHC: 32.8 g/dL (ref 30.0–36.0)
Platelets: 270 10*3/uL (ref 150–400)

## 2012-02-09 MED ORDER — ONDANSETRON HCL 4 MG/2ML IJ SOLN: 4.0000 mg | Freq: Four times a day (QID) | INTRAMUSCULAR | Status: DC | PRN

## 2012-02-09 MED ORDER — ACETAMINOPHEN 325 MG PO TABS: 650.0000 mg | ORAL_TABLET | Freq: Four times a day (QID) | ORAL | Status: DC | PRN

## 2012-02-09 MED ORDER — SODIUM CHLORIDE 0.9 % IV BOLUS (SEPSIS)
1000.0000 mL | Freq: Once | INTRAVENOUS | Status: AC
  Administered 2012-02-09: 1000 mL via INTRAVENOUS

## 2012-02-09 MED ORDER — ONDANSETRON HCL 4 MG PO TABS: 4.0000 mg | ORAL_TABLET | Freq: Four times a day (QID) | ORAL | Status: DC | PRN

## 2012-02-09 MED ORDER — PANTOPRAZOLE SODIUM 40 MG IV SOLR
40.0000 mg | Freq: Every day | INTRAVENOUS | Status: DC
  Administered 2012-02-09 (×2): 40 mg via INTRAVENOUS
  Filled 2012-02-09 (×3): qty 40

## 2012-02-09 MED ORDER — INSULIN ASPART 100 UNIT/ML ~~LOC~~ SOLN
0.0000 [IU] | SUBCUTANEOUS | Status: DC
  Administered 2012-02-09 (×4): 2 [IU] via SUBCUTANEOUS
  Administered 2012-02-10 (×4): 1 [IU] via SUBCUTANEOUS
  Administered 2012-02-10: 2 [IU] via SUBCUTANEOUS
  Administered 2012-02-10: 1 [IU] via SUBCUTANEOUS
  Administered 2012-02-11 (×2): 2 [IU] via SUBCUTANEOUS
  Administered 2012-02-11 (×2): 1 [IU] via SUBCUTANEOUS
  Administered 2012-02-11: 2 [IU] via SUBCUTANEOUS
  Administered 2012-02-11 – 2012-02-12 (×2): 1 [IU] via SUBCUTANEOUS
  Administered 2012-02-12 (×2): 2 [IU] via SUBCUTANEOUS
  Administered 2012-02-12 (×2): 1 [IU] via SUBCUTANEOUS
  Administered 2012-02-13 (×2): 2 [IU] via SUBCUTANEOUS
  Administered 2012-02-13: 1 [IU] via SUBCUTANEOUS
  Administered 2012-02-13: 2 [IU] via SUBCUTANEOUS
  Administered 2012-02-13: 1 [IU] via SUBCUTANEOUS
  Administered 2012-02-13: 2 [IU] via SUBCUTANEOUS
  Administered 2012-02-14: 3 [IU] via SUBCUTANEOUS
  Administered 2012-02-14 (×2): 2 [IU] via SUBCUTANEOUS
  Administered 2012-02-14: 1 [IU] via SUBCUTANEOUS
  Administered 2012-02-14: 2 [IU] via SUBCUTANEOUS

## 2012-02-09 MED ORDER — DIPHENHYDRAMINE HCL 50 MG/ML IJ SOLN
12.5000 mg | Freq: Four times a day (QID) | INTRAMUSCULAR | Status: DC | PRN
  Administered 2012-02-09 (×2): 25 mg via INTRAVENOUS
  Administered 2012-02-09: 12.5 mg via INTRAVENOUS
  Administered 2012-02-10 – 2012-02-12 (×8): 25 mg via INTRAVENOUS
  Filled 2012-02-09 (×10): qty 1

## 2012-02-09 MED ORDER — DOCUSATE SODIUM 100 MG PO CAPS
100.0000 mg | ORAL_CAPSULE | Freq: Two times a day (BID) | ORAL | Status: DC
Start: 2012-02-09 — End: 2012-02-09
  Filled 2012-02-09 (×2): qty 1

## 2012-02-09 MED ORDER — PHENOL 1.4 % MT LIQD: 2.0000 | OROMUCOSAL | Status: DC | PRN

## 2012-02-09 MED ORDER — LIP MEDEX EX OINT
1.0000 "application " | TOPICAL_OINTMENT | Freq: Two times a day (BID) | CUTANEOUS | Status: DC
  Administered 2012-02-09 – 2012-02-15 (×11): 1 via TOPICAL
  Filled 2012-02-09: qty 7

## 2012-02-09 MED ORDER — MAGIC MOUTHWASH
15.0000 mL | Freq: Four times a day (QID) | ORAL | Status: DC | PRN
  Administered 2012-02-09: 15 mL via ORAL
  Filled 2012-02-09: qty 15

## 2012-02-09 MED ORDER — BISACODYL 10 MG RE SUPP
10.0000 mg | Freq: Every day | RECTAL | Status: DC
Start: 2012-02-09 — End: 2012-02-15
  Administered 2012-02-09 – 2012-02-14 (×6): 10 mg via RECTAL
  Filled 2012-02-09 (×6): qty 1

## 2012-02-09 MED ORDER — CHLORHEXIDINE GLUCONATE 0.12 % MT SOLN
15.0000 mL | Freq: Four times a day (QID) | OROMUCOSAL | Status: DC
  Administered 2012-02-09 – 2012-02-14 (×18): 15 mL via OROMUCOSAL
  Filled 2012-02-09 (×28): qty 15

## 2012-02-09 MED ORDER — LACTATED RINGERS IV BOLUS (SEPSIS): 1000.0000 mL | Freq: Three times a day (TID) | INTRAVENOUS | Status: AC | PRN

## 2012-02-09 MED ORDER — ALUM & MAG HYDROXIDE-SIMETH 200-200-20 MG/5ML PO SUSP: 30.0000 mL | Freq: Four times a day (QID) | ORAL | Status: DC | PRN

## 2012-02-09 MED ORDER — SODIUM CHLORIDE 0.9 % IV SOLN
INTRAVENOUS | Status: AC
  Administered 2012-02-09 (×2): via INTRAVENOUS

## 2012-02-09 MED ORDER — ACETAMINOPHEN 650 MG RE SUPP: 650.0000 mg | Freq: Four times a day (QID) | RECTAL | Status: DC | PRN

## 2012-02-09 MED ORDER — MENTHOL 3 MG MT LOZG: 1.0000 | LOZENGE | OROMUCOSAL | Status: DC | PRN

## 2012-02-09 MED ORDER — PROMETHAZINE HCL 25 MG/ML IJ SOLN
12.5000 mg | Freq: Four times a day (QID) | INTRAMUSCULAR | Status: DC | PRN
  Administered 2012-02-09: 12.5 mg via INTRAVENOUS
  Filled 2012-02-09: qty 1

## 2012-02-09 NOTE — Progress Notes (Addendum)
[facility] PROGRESS NOTE  [name]:[id number] DOB: 16-Jul-1955 DOA: 02/08/2012 PCP: Emeterio Reeve, MD  Brief narrative: [name] is a 57 year old woman with a past medical history of diabetes, hypertension, myocardial infarction, and chronic pain as well as a prior history of multiple abdominal surgeries and small bowel obstruction who was admitted to the hospital 02/09/2012 with recurrent small bowel obstruction.  Assessment/Plan: Principal Problem:  *SBO (small bowel obstruction)  Admitted and placed on bowel rest. NG tube placed for gastric decompression. Surgical consultation requested.  F/U abdominal films in a.m.  Mobilize as tolerated.  Minimize narcotics. Active Problems: AKI/dehydration  Baseline creatinine 0.8-1.0.  Likely secondary to GI losses/dehydration.  Hydrate and monitor. Obesity (BMI 30-39.9)  NPO presently.  Hypertension  Atenolol currently on hold. Blood pressure stable.  Hyperlipidemia  Statin currently on hold.  Diabetes  Hemoglobin A1c 8%. Currently on every 4 hours sliding scale insulin.  Code Status: Full code Family Communication: None at bedside. Disposition Plan: Home when stable.   Medical Consultants:  Dr. Michaell Cowing, Surgery  Other Consultants:  None.  Anti-infectives:  None.  HPI/Subjective: [name] is still having abdominal pain.  No N/V since last night.  No flatus.  Last BM yesterday a.m.    Objective: Filed Vitals:   02/09/12 0044 02/09/12 0159 02/09/12 0300 02/09/12 0316  BP: 127/79 137/84 125/70   Pulse: 65 70  67  Temp: 98.7 F (37.1 C) 97.4 F (36.3 C)    TempSrc: Oral Oral    Resp: 20 20  20   Height: 5\' 6"  (1.676 m)     Weight: 100.4 kg (221 lb 5.5 oz)     SpO2: 94% 92%      Intake/Output Summary (Last 24 hours) at 02/09/12 0825 Last data filed at 02/09/12 0600  Gross per 24 hour  Intake 790.75 ml  Output    450 ml  Net 340.75 ml    Exam: Gen:  NAD Cardiovascular:  RRR, No  M/R/G Respiratory:  Lungs CTAB Gastrointestinal:  Abdomen soft, tender diffusely, + BS Extremities:  No C/E/C  Data Reviewed: Basic Metabolic Panel:  Lab 02/09/12 8657 02/08/12 1730  NA 134* 137  K 4.5 4.2  CL 99 101  CO2 22 22  GLUCOSE 208* 228*  BUN 22 27*  CREATININE 1.11* 1.13*  CALCIUM 9.4 9.4  MG 2.1 --  PHOS 4.2 --   GFR Estimated Creatinine Clearance: 66.8 ml/min (by C-G formula based on Cr of 1.11). Liver Function Tests:  Lab 02/09/12 0415 02/08/12 1730  AST 49* 44*  ALT 44* 40*  ALKPHOS 110 113  BILITOT 0.5 0.5  PROT 8.3 8.2  ALBUMIN 3.9 3.8    Lab 02/08/12 1730  LIPASE 33  AMYLASE --   CBC:  Lab 02/09/12 0415 02/08/12 1730  WBC 9.3 6.2  NEUTROABS -- 3.5  HGB 12.8 13.5  HCT 39.0 39.5  MCV 82.6 80.9  PLT 270 270   CBG:  Lab 02/09/12 0357 02/09/12 0132  GLUCAP 188* 198*   Hgb A1c  Basename 02/08/12 1730  HGBA1C 8.0*   Microbiology No results found for this or any previous visit (from the past 240 hour(s)).   Procedures and Diagnostic Studies: Ct Abdomen Pelvis W Contrast  02/08/2012  *RADIOLOGY REPORT*  Clinical Data: Abdominal pain, nausea, vomiting and diarrhea. History of small bowel obstruction.  The patient reports similar symptoms at the time of previous obstruction  CT ABDOMEN AND PELVIS WITH CONTRAST  Technique:  Multidetector CT imaging of the  abdomen and pelvis was performed following the standard protocol during bolus administration of intravenous contrast.  Contrast: OMNIPAQUE IOHEXOL 300 MG/ML  SOLN  Comparison: Radiographs obtained earlier today.  CT dated 10/10/2011.  Findings: Diffuse low density of the liver relative to the spleen is again demonstrated.  Interval dilated, fluid-filled loops of small bowel in the pelvis and mid and lower abdomen, with fecalization of some of the small bowel contents.  The small bowel tapers to a more normal caliber just proximal to a small bowel to the small bowel anastomosis in the upper  right pelvis.  There is focal small bowel dilatation and fecalization of contents at the location of the anastomosis.  The small bowel loop extending from the region of the anastomosis is minimally dilated and tapers to a normal caliber distally.  No colonic dilatation is seen.  There are scattered colonic diverticula without evidence of diverticulitis.  No evidence of appendicitis.  Small amount of free peritoneal fluid in the pelvis.  Small accessory splenule.  The spleen, pancreas, gallbladder, adrenal glands, kidneys and urinary bladder are unremarkable.  Surgically absent uterus.  No adnexal masses or enlarged lymph nodes.  Hernia repair mesh.  A 4 mm subpleural nodular density in the left lower lobe on image number 1 is unchanged since 04/03/2009, compatible with a benign process.  Minimal linear atelectasis or scarring is noted at the right lung base.  Bilateral L5 pars interarticularis defects with associated grade 1 anterolisthesis at the L5-S1 level.  There is also moderate to marked disc space narrowing with a vacuum phenomenon, discogenic sclerosis and anterior spur formation at that level.  Milder degenerative changes elsewhere in the lumbar and lower thoracic spine.  IMPRESSION:  1.  Small bowel obstruction to the level of the right pelvis in the region of a small bowel to small bowel anastomosis.  This could be due to an adhesion or anastamotic scarring.  2.  Hepatic steatosis. 3.  Colonic diverticulosis. 4.  Stable bilateral L5 spondylolysis and associated grade 1 spondylolisthesis at the L5-S1 level.   Original Report Authenticated By: Beckie Salts, M.D.    Dg Abd Acute W/chest  02/08/2012  *RADIOLOGY REPORT*  Clinical Data: Diffuse abdominal pain, nausea and diarrhea.  ACUTE ABDOMEN SERIES (ABDOMEN 2 VIEW & CHEST 1 VIEW)  Comparison: Previous examinations.  Findings: Normal sized heart.  Diffusely prominent interstitial markings.  Small nodular density in the lateral aspect of the right upper lung  zone without significant change since 12/13/2011.  This corresponds to a focal calcified pleural plaque or rib exostosis on the CT dated 01/11/2012. Normal bowel gas pattern without free peritoneal air.  Hernia fixation markers overlying the upper pelvis and lower abdomen.  Changes of osteitis pubis.  IMPRESSION:  1.  No acute abnormality. 2.  Chronic interstitial lung disease.   Original Report Authenticated By: Beckie Salts, M.D.     Scheduled Meds:   . chlorhexidine  15 mL Mouth/Throat QID  . docusate sodium  100 mg Oral BID  . insulin aspart  0-9 Units Subcutaneous Q4H  . pantoprazole (PROTONIX) IV  40 mg Intravenous QHS   Continuous Infusions:   . sodium chloride 125 mL/hr at 02/09/12 0814    Time spent: 25 minutes.   LOS: 1 day   Sanyla Summey  [facility] Pager [phone].  If 8PM-8AM, please contact night-coverage at [email or web address], password [facility] 02/09/2012, 8:25 AM

## 2012-02-09 NOTE — ED Notes (Signed)
Patient was given Dilaudid 2mg  IV before being transferred to floor

## 2012-02-09 NOTE — Progress Notes (Signed)
[name]  October 20, 1955 [id number]   This patient is a 57 y.o.female who presents today for surgical evaluation at the request of Dr. Gonzella Lex, [facility].  Reason for evaluation: Recurrent small bowel obstruction.  Pleasant morbidly obese female.  Head a bowel obstruction incarcerated in the ventral hernia.  I repair this laparoscopically in 2009.  Readmissions with small bowel obstructions in 2011 in 2012.  Resolved with nasogastric tube decompression and IV fluids.  Apparently some family members have been sick with nausea and vomiting.  Yesterday she began to have nausea vomiting and abdominal pain.  Last bowel movement yesterday.  No severe diarrhea.  Because the pain intensified She came to the emergency room.CT scan concerning for small bowel obstruction.  Admitted to the hospitalist medicine service last night.  Worsening pain in the middle the night. Vomited around NG tube. Surgical consultation requested this morning.  Patient feels a little pain now but better overall.  Past Medical History  Diagnosis Date  . Diabetes mellitus   . Hypertension   . Myocardial infarction 2000  . Chronic pain following surgery or procedure 2008    Abdominal pain    Past Surgical History  Procedure Date  . Hernia repair 2008  . Abdominal hysterectomy 1999    partial  . Abdominal hysterectomy 2000    complete  . Cesarean section      X  2  . Knee arthroscopy     Left  . Abdominal surgery   . Bowel obstruction     History   Social History  . Marital Status: Widowed    Spouse Name: N/A    Number of Children: N/A  . Years of Education: N/A   Occupational History  . Not on file.   Social History Main Topics  . Smoking status: Never Smoker   . Smokeless tobacco: Not on file  . Alcohol Use: Yes     Comment: rarely  . Drug Use: No  . Sexually Active:    Other Topics Concern  . Not on file   Social History Narrative  . No narrative on file    Family History    Problem Relation Age of Onset  . CAD Brother      X 2  . CAD Sister      X 1  . Hypertension      ALL    Current Facility-Administered Medications  Medication Dose Route Frequency Provider Last Rate Last Dose  . 0.9 %  sodium chloride infusion   Intravenous Continuous Therisa Doyne, MD 125 mL/hr at 02/09/12 0814    . acetaminophen (TYLENOL) suppository 650 mg  650 mg Rectal Q6H PRN Therisa Doyne, MD      . alum & mag hydroxide-simeth (MAALOX/MYLANTA) 200-200-20 MG/5ML suspension 30 mL  30 mL Oral Q6H PRN Ardeth Sportsman, MD      . bisacodyl (DULCOLAX) suppository 10 mg  10 mg Rectal Daily Ardeth Sportsman, MD      . chlorhexidine (PERIDEX) 0.12 % solution 15 mL  15 mL Mouth/Throat QID Therisa Doyne, MD      . diphenhydrAMINE (BENADRYL) injection 12.5-25 mg  12.5-25 mg Intravenous Q6H PRN Ardeth Sportsman, MD      . HYDROmorphone (DILAUDID) injection 1-2 mg  1-2 mg Intravenous Q3H PRN Therisa Doyne, MD   2 mg at 02/09/12 0757  . insulin aspart (novoLOG) injection 0-9 Units  0-9 Units Subcutaneous Q4H Therisa Doyne, MD   2 Units at 02/09/12 0509  .  lactated ringers bolus 1,000 mL  1,000 mL Intravenous Q8H PRN Ardeth Sportsman, MD      . lip balm (CARMEX) ointment 1 application  1 application Topical BID Ardeth Sportsman, MD      . magic mouthwash  15 mL Oral QID PRN Ardeth Sportsman, MD      . menthol-cetylpyridinium (CEPACOL) lozenge 3 mg  1 lozenge Oral PRN Ardeth Sportsman, MD      . ondansetron [facility]) injection 4 mg  4 mg Intravenous Q6H PRN Therisa Doyne, MD      . pantoprazole (PROTONIX) injection 40 mg  40 mg Intravenous QHS Therisa Doyne, MD   40 mg at 02/09/12 0147  . phenol (CHLORASEPTIC) mouth spray 2 spray  2 spray Mouth/Throat PRN Ardeth Sportsman, MD      . promethazine (PHENERGAN) injection 12.5 mg  12.5 mg Intravenous Q6H PRN Therisa Doyne, MD   12.5 mg at 02/09/12 0303     Allergies  Allergen Reactions  . Cymbalta (Duloxetine Hcl)  Other (See Comments)    dizzy  . Penicillins Other (See Comments)    unknown  . Tramadol     unknown  . Trazodone And Nefazodone     ROS: Constitutional:  No fevers, chills, sweats.  Weight stable Eyes:  No vision changes, No discharge HENT:  No sore throats, nasal drainage Lymph: No neck swelling, No bruising easily Pulmonary:  No cough, productive sputum CV: No orthopnea, PND  Patient walks 20 minutes for about 1/3 miles without difficulty.  No exertional chest/neck/shoulder/arm pain. GI:  No personal nor family history of GI/colon cancer, inflammatory bowel disease, irritable bowel syndrome, allergy such as Celiac Sprue, dietary/dairy problems, colitis, ulcers nor gastritis.  No recent sick contacts/gastroenteritis.  No travel outside the country.  No changes in diet. Renal: No UTIs, No hematuria Genital:  No drainage, bleeding, masses Musculoskeletal: No severe joint pain.  Good ROM major joints Skin:  No sores or lesions.  No rashes Heme/Lymph:  No easy bleeding.  No swollen lymph nodes Neuro: No focal weakness/numbness.  No seizures Psych: No suicidal ideation.  No hallucinations  BP 125/70  Pulse 67  Temp 97.4 F (36.3 C) (Oral)  Resp 20  Ht 5\' 6"  (1.676 m)  Wt 221 lb 5.5 oz (100.4 kg)  BMI 35.73 kg/m2  SpO2 92%  Physical Exam: General: Pt awake/alert/oriented x4 in mild acute distress Eyes: PERRL, normal EOM. Sclera nonicteric Neuro: CN II-XII intact w/o focal sensory/motor deficits. Lymph: No head/neck/groin lymphadenopathy Psych:  No delerium/psychosis/paranoia HENT: Normocephalic, Mucus membranes moist.  No thrush Neck: Supple, No tracheal deviation Chest: No pain.  Good respiratory excursion. CV:  Pulses intact.  Regular rhythm Abdomen: Soft, Obese Mildly distended.  Tender infraumbilically.  No peritonitis.  No incarcerated hernias. Ext:  SCDs BLE.  No significant edema.  No cyanosis Skin: No petechiae / purpurea.  No major sores Musculoskeletal: No severe  joint pain.  Good ROM major joints   Results:   Labs: Results for orders placed during the hospital encounter of 02/08/12 (from the past 48 hour(s))  URINALYSIS, MICROSCOPIC ONLY     Status: Normal   Collection Time   02/08/12  5:17 PM      Component Value Range Comment   Color, Urine YELLOW  YELLOW    APPearance CLEAR  CLEAR    Specific Gravity, Urine 1.021  1.005 - 1.030    pH 5.5  5.0 - 8.0    Glucose, UA NEGATIVE  NEGATIVE mg/dL    Hgb urine dipstick NEGATIVE  NEGATIVE    Bilirubin Urine NEGATIVE  NEGATIVE    Ketones, ur NEGATIVE  NEGATIVE mg/dL    Protein, ur NEGATIVE  NEGATIVE mg/dL    Urobilinogen, UA 0.2  0.0 - 1.0 mg/dL    Nitrite NEGATIVE  NEGATIVE    Leukocytes, UA NEGATIVE  NEGATIVE    Squamous Epithelial / LPF RARE  RARE   CBC WITH DIFFERENTIAL     Status: Normal   Collection Time   02/08/12  5:30 PM      Component Value Range Comment   WBC 6.2  4.0 - 10.5 K/uL    RBC 4.88  3.87 - 5.11 MIL/uL    Hemoglobin 13.5  12.0 - 15.0 g/dL    HCT 16.1  09.6 - 04.5 %    MCV 80.9  78.0 - 100.0 fL    MCH 27.7  26.0 - 34.0 pg    MCHC 34.2  30.0 - 36.0 g/dL    RDW 40.9  81.1 - 91.4 %    Platelets 270  150 - 400 K/uL    Neutrophils Relative 57  43 - 77 %    Neutro Abs 3.5  1.7 - 7.7 K/uL    Lymphocytes Relative 34  12 - 46 %    Lymphs Abs 2.1  0.7 - 4.0 K/uL    Monocytes Relative 6  3 - 12 %    Monocytes Absolute 0.4  0.1 - 1.0 K/uL    Eosinophils Relative 4  0 - 5 %    Eosinophils Absolute 0.2  0.0 - 0.7 K/uL    Basophils Relative 0  0 - 1 %    Basophils Absolute 0.0  0.0 - 0.1 K/uL   COMPREHENSIVE METABOLIC PANEL     Status: Abnormal   Collection Time   02/08/12  5:30 PM      Component Value Range Comment   Sodium 137  135 - 145 mEq/L    Potassium 4.2  3.5 - 5.1 mEq/L    Chloride 101  96 - 112 mEq/L    CO2 22  19 - 32 mEq/L    Glucose, Bld 228 (*) 70 - 99 mg/dL    BUN 27 (*) 6 - 23 mg/dL    Creatinine, Ser 7.82 (*) 0.50 - 1.10 mg/dL    Calcium 9.4  8.4 - 95.6 mg/dL      Total Protein 8.2  6.0 - 8.3 g/dL    Albumin 3.8  3.5 - 5.2 g/dL    AST 44 (*) 0 - 37 U/L    ALT 40 (*) 0 - 35 U/L    Alkaline Phosphatase 113  39 - 117 U/L    Total Bilirubin 0.5  0.3 - 1.2 mg/dL    GFR calc non Af Amer 53 (*) >90 mL/min    GFR calc Af Amer 61 (*) >90 mL/min   LIPASE, BLOOD     Status: Normal   Collection Time   02/08/12  5:30 PM      Component Value Range Comment   Lipase 33  11 - 59 U/L   HEMOGLOBIN A1C     Status: Abnormal   Collection Time   02/08/12  5:30 PM      Component Value Range Comment   Hemoglobin A1C 8.0 (*) <5.7 %    Mean Plasma Glucose 183 (*) <117 mg/dL   LACTIC ACID, PLASMA     Status: Normal   Collection  Time   02/08/12 11:10 PM      Component Value Range Comment   Lactic Acid, Venous 2.1  0.5 - 2.2 mmol/L   GLUCOSE, CAPILLARY     Status: Abnormal   Collection Time   02/09/12  1:32 AM      Component Value Range Comment   Glucose-Capillary 198 (*) 70 - 99 mg/dL   GLUCOSE, CAPILLARY     Status: Abnormal   Collection Time   02/09/12  3:57 AM      Component Value Range Comment   Glucose-Capillary 188 (*) 70 - 99 mg/dL   LACTIC ACID, PLASMA     Status: Abnormal   Collection Time   02/09/12  4:15 AM      Component Value Range Comment   Lactic Acid, Venous 2.6 (*) 0.5 - 2.2 mmol/L   MAGNESIUM     Status: Normal   Collection Time   02/09/12  4:15 AM      Component Value Range Comment   Magnesium 2.1  1.5 - 2.5 mg/dL   PHOSPHORUS     Status: Normal   Collection Time   02/09/12  4:15 AM      Component Value Range Comment   Phosphorus 4.2  2.3 - 4.6 mg/dL   COMPREHENSIVE METABOLIC PANEL     Status: Abnormal   Collection Time   02/09/12  4:15 AM      Component Value Range Comment   Sodium 134 (*) 135 - 145 mEq/L    Potassium 4.5  3.5 - 5.1 mEq/L    Chloride 99  96 - 112 mEq/L    CO2 22  19 - 32 mEq/L    Glucose, Bld 208 (*) 70 - 99 mg/dL    BUN 22  6 - 23 mg/dL    Creatinine, Ser 4.09 (*) 0.50 - 1.10 mg/dL    Calcium 9.4  8.4 - 81.1 mg/dL     Total Protein 8.3  6.0 - 8.3 g/dL    Albumin 3.9  3.5 - 5.2 g/dL    AST 49 (*) 0 - 37 U/L    ALT 44 (*) 0 - 35 U/L    Alkaline Phosphatase 110  39 - 117 U/L    Total Bilirubin 0.5  0.3 - 1.2 mg/dL    GFR calc non Af Amer 54 (*) >90 mL/min    GFR calc Af Amer 63 (*) >90 mL/min   CBC     Status: Normal   Collection Time   02/09/12  4:15 AM      Component Value Range Comment   WBC 9.3  4.0 - 10.5 K/uL    RBC 4.72  3.87 - 5.11 MIL/uL    Hemoglobin 12.8  12.0 - 15.0 g/dL    HCT 91.4  78.2 - 95.6 %    MCV 82.6  78.0 - 100.0 fL    MCH 27.1  26.0 - 34.0 pg    MCHC 32.8  30.0 - 36.0 g/dL    RDW 21.3  08.6 - 57.8 %    Platelets 270  150 - 400 K/uL     Imaging / Studies: Ct Chest W Contrast  01/11/2012  *RADIOLOGY REPORT*  Clinical Data: Follow up of multifocal pneumonia and lung nodules, former smoking history  CT CHEST WITH CONTRAST  Technique:  Multidetector CT imaging of the chest was performed following the standard protocol during bolus administration of intravenous contrast.  Contrast: 50mL OMNIPAQUE IOHEXOL 300 MG/ML  SOLN  Comparison: CT  chest of 12/15/2011  Findings: The foci of airspace disease noted previously throughout the lungs have resolved.  Some scarring remains in the right upper lobe near the apex.  The small lung nodules noted previously appear to largely have decreased in size most consistent with lymph nodes possibly along the fissures.  The pleural based soft tissue nodule peripherally in the right upper hemithorax has diminished in size but now appears partially calcified.  This may represent a developing pleural plaque and continued follow-up in 6-12 months is recommended.  There are two small adjacent nodules in the left lower lobe at the site of the prior left lower lobe pneumonia.  The larger of these measures 5 mm in diameter, and this area was previously somewhat obscured by the pneumonia.  Continued follow-up of this nodule also is recommended in 6-12 months.  No pleural  effusion is seen.  The airway is patent.  There is an old healed right sixth rib fracture laterally.  On soft tissue window images, the thyroid gland is unremarkable. The thoracic aorta and the pulmonary arteries opacify with no acute abnormality noted.  No mediastinal or hilar adenopathy is seen. The liver appears somewhat low in attenuation possibly due to fatty infiltration.  Correlation with LFTs is recommended.  IMPRESSION:  1.  Resolution of multifocal pneumonia. 2.  Many of the lung nodules noted previously have resolved or decreased in size.  However there is a persistent 5 mm nodule in the left lower lobe and continued follow-up is recommended in 6-12 months. 3.  Decrease in size of pleural based soft tissue nodule in the right upper hemithorax which now appears partially calcified. Possible developing calcified pleural plaque.  Again follow-up is recommended in 6-12 months to assess stability. 4.  Question fatty infiltration of the liver.  Correlate with liver function tests.   Original Report Authenticated By: Dwyane Dee, M.D.    Ct Abdomen Pelvis W Contrast  02/08/2012  *RADIOLOGY REPORT*  Clinical Data: Abdominal pain, nausea, vomiting and diarrhea. History of small bowel obstruction.  The patient reports similar symptoms at the time of previous obstruction  CT ABDOMEN AND PELVIS WITH CONTRAST  Technique:  Multidetector CT imaging of the abdomen and pelvis was performed following the standard protocol during bolus administration of intravenous contrast.  Contrast: OMNIPAQUE IOHEXOL 300 MG/ML  SOLN  Comparison: Radiographs obtained earlier today.  CT dated 10/10/2011.  Findings: Diffuse low density of the liver relative to the spleen is again demonstrated.  Interval dilated, fluid-filled loops of small bowel in the pelvis and mid and lower abdomen, with fecalization of some of the small bowel contents.  The small bowel tapers to a more normal caliber just proximal to a small bowel to the small  bowel anastomosis in the upper right pelvis.  There is focal small bowel dilatation and fecalization of contents at the location of the anastomosis.  The small bowel loop extending from the region of the anastomosis is minimally dilated and tapers to a normal caliber distally.  No colonic dilatation is seen.  There are scattered colonic diverticula without evidence of diverticulitis.  No evidence of appendicitis.  Small amount of free peritoneal fluid in the pelvis.  Small accessory splenule.  The spleen, pancreas, gallbladder, adrenal glands, kidneys and urinary bladder are unremarkable.  Surgically absent uterus.  No adnexal masses or enlarged lymph nodes.  Hernia repair mesh.  A 4 mm subpleural nodular density in the left lower lobe on image number 1 is unchanged since  04/03/2009, compatible with a benign process.  Minimal linear atelectasis or scarring is noted at the right lung base.  Bilateral L5 pars interarticularis defects with associated grade 1 anterolisthesis at the L5-S1 level.  There is also moderate to marked disc space narrowing with a vacuum phenomenon, discogenic sclerosis and anterior spur formation at that level.  Milder degenerative changes elsewhere in the lumbar and lower thoracic spine.  IMPRESSION:  1.  Small bowel obstruction to the level of the right pelvis in the region of a small bowel to small bowel anastomosis.  This could be due to an adhesion or anastamotic scarring.  2.  Hepatic steatosis. 3.  Colonic diverticulosis. 4.  Stable bilateral L5 spondylolysis and associated grade 1 spondylolisthesis at the L5-S1 level.   Original Report Authenticated By: Beckie Hood, M.D.    Dg Abd Acute W/chest  02/08/2012  *RADIOLOGY REPORT*  Clinical Data: Diffuse abdominal pain, nausea and diarrhea.  ACUTE ABDOMEN SERIES (ABDOMEN 2 VIEW & CHEST 1 VIEW)  Comparison: Previous examinations.  Findings: Normal sized heart.  Diffusely prominent interstitial markings.  Small nodular density in the lateral  aspect of the right upper lung zone without significant change since 12/13/2011.  This corresponds to a focal calcified pleural plaque or rib exostosis on the CT dated 01/11/2012. Normal bowel gas pattern without free peritoneal air.  Hernia fixation markers overlying the upper pelvis and lower abdomen.  Changes of osteitis pubis.  IMPRESSION:  1.  No acute abnormality. 2.  Chronic interstitial lung disease.   Original Report Authenticated By: Beckie Hood, M.D.     Medications / Allergies: per chart  Antibiotics: Anti-infectives    None      Assessment  [name]  57 y.o. female       Problem List:  Principal Problem:  *SBO (small bowel obstruction) Active Problems:  Diabetes   SBO, recurrent - most likely due to adhesions  Plan:  -NGT -IVF for dehydration -f/u Xrays in AM to see if contrast in colon & improving -DM control -hold K  -VTE prophylaxis- SCDs, etc -mobilize as tolerated to help recovery & help stimulate return of bowel function -Will try to wean her off chronic narcotics as I think this exacerbates her problems. -Bowel regimen one small bowel obstruction resolved to help prevent further problems.  -if clinically worsens of does not improve by Monday, consider OR exploration.  I am concerned that the risk is higher this time given the fact that this is her third recurrence since surgery in 2009 and there is some SB fecalization present.  However, she is not in shock and her pain is less after nasogastric tube decompression, so I am hopeful nonoperative measures will turn things around.  We will follow closely with with the medicine service    Ardeth Sportsman, M.D., F.A.C.S. Gastrointestinal [facility]ally [facility] [address] [place], [place] [address] [phone] Main / Paging [phone] Voice Mail   02/09/2012  CARE TEAM:  PCP: Emeterio Reeve, MD  Outpatient Care Team: Patient Care  Team: Emeterio Reeve, MD as PCP - General (Family Medicine)  Inpatient Treatment Team: Treatment Team: Attending Provider: Maryruth Bun Rama, MD; Registered Nurse: Tristan Schroeder, RN; Technician: Lynden Ang, NT; Registered Nurse: Rometta Emery, RN; Rounding Team: Alton Revere, MD; Consulting Physician: Bishop Limbo, MD

## 2012-02-09 NOTE — Progress Notes (Signed)
Patient vomited large amount of coffee ground mixed with undigested food.  NG checked for placement and patency maintained.  Dr Adela Glimpse notified.  New order received.

## 2012-02-10 ENCOUNTER — Inpatient Hospital Stay (HOSPITAL_COMMUNITY): Payer: Managed Care, Other (non HMO)

## 2012-02-10 DIAGNOSIS — E876 Hypokalemia: Secondary | ICD-10-CM

## 2012-02-10 LAB — GLUCOSE, CAPILLARY
Glucose-Capillary: 144 mg/dL — ABNORMAL HIGH (ref 70–99)
Glucose-Capillary: 146 mg/dL — ABNORMAL HIGH (ref 70–99)
Glucose-Capillary: 149 mg/dL — ABNORMAL HIGH (ref 70–99)

## 2012-02-10 LAB — BASIC METABOLIC PANEL
Calcium: 8.9 mg/dL (ref 8.4–10.5)
Chloride: 103 mEq/L (ref 96–112)
Creatinine, Ser: 0.92 mg/dL (ref 0.50–1.10)
GFR calc Af Amer: 79 mL/min — ABNORMAL LOW (ref >90)
Sodium: 137 mEq/L (ref 135–145)

## 2012-02-10 MED ORDER — KCL IN DEXTROSE-NACL 40-5-0.9 MEQ/L-%-% IV SOLN
INTRAVENOUS | Status: DC
  Administered 2012-02-10 – 2012-02-11 (×5): via INTRAVENOUS
  Administered 2012-02-12: 125 mL via INTRAVENOUS
  Administered 2012-02-12 – 2012-02-13 (×3): via INTRAVENOUS
  Administered 2012-02-13: 20 mL/h via INTRAVENOUS
  Administered 2012-02-13: 01:00:00 via INTRAVENOUS
  Filled 2012-02-10 (×13): qty 1000

## 2012-02-10 MED ORDER — SODIUM CHLORIDE 0.9 % IV BOLUS (SEPSIS)
1000.0000 mL | Freq: Once | INTRAVENOUS | Status: AC
  Administered 2012-02-10: 1000 mL via INTRAVENOUS

## 2012-02-10 MED ORDER — PANTOPRAZOLE SODIUM 40 MG IV SOLR
40.0000 mg | Freq: Two times a day (BID) | INTRAVENOUS | Status: DC
  Administered 2012-02-10 – 2012-02-13 (×8): 40 mg via INTRAVENOUS
  Filled 2012-02-10 (×10): qty 40

## 2012-02-10 MED ORDER — POTASSIUM CHLORIDE 10 MEQ/100ML IV SOLN
10.0000 meq | INTRAVENOUS | Status: AC
  Administered 2012-02-10 (×4): 10 meq via INTRAVENOUS
  Filled 2012-02-10 (×4): qty 100

## 2012-02-10 NOTE — Progress Notes (Signed)
LENORIA NARINE [id number] [birth year]   Subjective:  No major events Abd pain after returning from Xray & more epigastric, not diffuse.  No pain meds for last 9 hours. No nausea/retching  Objective:  Vital signs:  Filed Vitals:   02/09/12 1835 02/09/12 2140 02/10/12 0111 02/10/12 0510  BP: 132/75 118/68 121/68 138/81  Pulse: 72 68 73 69  Temp: 98.6 F (37 C) 98.7 F (37.1 C) 98.2 F (36.8 C) 98.7 F (37.1 C)  TempSrc: Oral Oral Oral Oral  Resp: 18 20 16 16   Height:      Weight:      SpO2: 92% 92% 93% 95%    Last BM Date: 02/08/12  Intake/Output   Yesterday:  01/04 0701 - 01/05 0700 In: 2075 [P.O.:60; I.V.:660] Out: 2650 [Urine:1450; Emesis/NG output:1200] This shift:     Bowel function:  Flatus: n  BM: n  Physical Exam:  General: Pt awake/alert/oriented x4 in no acute distress Eyes: PERRL, normal EOM.  Sclera clear.  No icterus Neuro: CN II-XII intact w/o focal sensory/motor deficits. Lymph: No head/neck/groin lymphadenopathy Psych:  No delerium/psychosis/paranoia HENT: Normocephalic, Mucus membranes moist.  No thrush Neck: Supple, No tracheal deviation Chest: No chest wall pain w good excursion CV:  Pulses intact.  Regular rhythm MS: Normal AROM mjr joints.  No obvious deformity Abdomen: Soft.  Nondistended.  Mild/mod tender epigastric only.  Not in lower abdomen.  No peritonitis.  No incarcerated hernias. Ext:  SCDs BLE.  No mjr edema.  No cyanosis Skin: No petechiae / purpurae  Problem List:  Principal Problem:  *SBO (small bowel obstruction) Active Problems:  Diabetes  Obesity (BMI 30-39.9)  AKI (acute kidney injury)  Dehydration   Assessment  [name]  57 y.o. female       PSBO improving by Xray (contrast in colon) & less diffuse pain / less pain med needs  Plan:  -NGT -enema to help clear out large stool volume -Low K - replace -inc PPI to BID for possible  -VTE prophylaxis- SCDs, etc -mobilize as tolerated to help  recovery  The patient is improved (albeit slightly).  There is no evidence of peritonitis, acute abdomen, nor shock.  There is no strong evidence of failure of improvement nor decline with current non-operative management.  Xrays improved.  There is no need for surgery at the present moment.  We will continue to follow closely.   Ardeth Sportsman, M.D., F.A.C.S. Gastrointestinal and Minimally Invasive Surgery [facility] [address], [address] [place], [place] [address] [phone] Main / Paging [phone] Voice Mail   02/10/2012  CARE TEAM:  PCP: Emeterio Reeve, MD  Outpatient Care Team: Patient Care Team: Emeterio Reeve, MD as PCP - General (Family Medicine)  Inpatient Treatment Team: Treatment Team: Attending Provider: Maryruth Bun Rama, MD; Registered Nurse: Tristan Schroeder, RN; Technician: Lynden Ang, NT; Registered Nurse: Rometta Emery, RN; Rounding Team: Alton Revere, MD; Consulting Physician: Bishop Limbo, MD; Registered Nurse: Ignacia Marvel II, RN; Technician: Karl Pock, NT; Technician: Elicia Lamp, NT   Results:   Labs: Results for orders placed during the hospital encounter of 02/08/12 (from the past 48 hour(s))  URINALYSIS, MICROSCOPIC ONLY     Status: Normal   Collection Time   02/08/12  5:17 PM      Component Value Range Comment   Color, Urine YELLOW  YELLOW    APPearance CLEAR  CLEAR    Specific Gravity,  Urine 1.021  1.005 - 1.030    pH 5.5  5.0 - 8.0    Glucose, UA NEGATIVE  NEGATIVE mg/dL    Hgb urine dipstick NEGATIVE  NEGATIVE    Bilirubin Urine NEGATIVE  NEGATIVE    Ketones, ur NEGATIVE  NEGATIVE mg/dL    Protein, ur NEGATIVE  NEGATIVE mg/dL    Urobilinogen, UA 0.2  0.0 - 1.0 mg/dL    Nitrite NEGATIVE  NEGATIVE    Leukocytes, UA NEGATIVE  NEGATIVE    Squamous Epithelial / LPF RARE  RARE   CBC WITH DIFFERENTIAL     Status: Normal   Collection Time   02/08/12  5:30 PM       Component Value Range Comment   WBC 6.2  4.0 - 10.5 K/uL    RBC 4.88  3.87 - 5.11 MIL/uL    Hemoglobin 13.5  12.0 - 15.0 g/dL    HCT 40.9  81.1 - 91.4 %    MCV 80.9  78.0 - 100.0 fL    MCH 27.7  26.0 - 34.0 pg    MCHC 34.2  30.0 - 36.0 g/dL    RDW 78.2  95.6 - 21.3 %    Platelets 270  150 - 400 K/uL    Neutrophils Relative 57  43 - 77 %    Neutro Abs 3.5  1.7 - 7.7 K/uL    Lymphocytes Relative 34  12 - 46 %    Lymphs Abs 2.1  0.7 - 4.0 K/uL    Monocytes Relative 6  3 - 12 %    Monocytes Absolute 0.4  0.1 - 1.0 K/uL    Eosinophils Relative 4  0 - 5 %    Eosinophils Absolute 0.2  0.0 - 0.7 K/uL    Basophils Relative 0  0 - 1 %    Basophils Absolute 0.0  0.0 - 0.1 K/uL   COMPREHENSIVE METABOLIC PANEL     Status: Abnormal   Collection Time   02/08/12  5:30 PM      Component Value Range Comment   Sodium 137  135 - 145 mEq/L    Potassium 4.2  3.5 - 5.1 mEq/L    Chloride 101  96 - 112 mEq/L    CO2 22  19 - 32 mEq/L    Glucose, Bld 228 (*) 70 - 99 mg/dL    BUN 27 (*) 6 - 23 mg/dL    Creatinine, Ser 0.86 (*) 0.50 - 1.10 mg/dL    Calcium 9.4  8.4 - 57.8 mg/dL    Total Protein 8.2  6.0 - 8.3 g/dL    Albumin 3.8  3.5 - 5.2 g/dL    AST 44 (*) 0 - 37 U/L    ALT 40 (*) 0 - 35 U/L    Alkaline Phosphatase 113  39 - 117 U/L    Total Bilirubin 0.5  0.3 - 1.2 mg/dL    GFR calc non Af Amer 53 (*) >90 mL/min    GFR calc Af Amer 61 (*) >90 mL/min   LIPASE, BLOOD     Status: Normal   Collection Time   02/08/12  5:30 PM      Component Value Range Comment   Lipase 33  11 - 59 U/L   HEMOGLOBIN A1C     Status: Abnormal   Collection Time   02/08/12  5:30 PM      Component Value Range Comment   Hemoglobin A1C 8.0 (*) <5.7 %  Mean Plasma Glucose 183 (*) <117 mg/dL   LACTIC ACID, PLASMA     Status: Normal   Collection Time   02/08/12 11:10 PM      Component Value Range Comment   Lactic Acid, Venous 2.1  0.5 - 2.2 mmol/L   GLUCOSE, CAPILLARY     Status: Abnormal   Collection Time   02/09/12  1:32 AM       Component Value Range Comment   Glucose-Capillary 198 (*) 70 - 99 mg/dL   GLUCOSE, CAPILLARY     Status: Abnormal   Collection Time   02/09/12  3:57 AM      Component Value Range Comment   Glucose-Capillary 188 (*) 70 - 99 mg/dL   LACTIC ACID, PLASMA     Status: Abnormal   Collection Time   02/09/12  4:15 AM      Component Value Range Comment   Lactic Acid, Venous 2.6 (*) 0.5 - 2.2 mmol/L   MAGNESIUM     Status: Normal   Collection Time   02/09/12  4:15 AM      Component Value Range Comment   Magnesium 2.1  1.5 - 2.5 mg/dL   PHOSPHORUS     Status: Normal   Collection Time   02/09/12  4:15 AM      Component Value Range Comment   Phosphorus 4.2  2.3 - 4.6 mg/dL   TSH     Status: Abnormal   Collection Time   02/09/12  4:15 AM      Component Value Range Comment   TSH 5.211 (*) 0.350 - 4.500 uIU/mL   COMPREHENSIVE METABOLIC PANEL     Status: Abnormal   Collection Time   02/09/12  4:15 AM      Component Value Range Comment   Sodium 134 (*) 135 - 145 mEq/L    Potassium 4.5  3.5 - 5.1 mEq/L    Chloride 99  96 - 112 mEq/L    CO2 22  19 - 32 mEq/L    Glucose, Bld 208 (*) 70 - 99 mg/dL    BUN 22  6 - 23 mg/dL    Creatinine, Ser 7.82 (*) 0.50 - 1.10 mg/dL    Calcium 9.4  8.4 - 95.6 mg/dL    Total Protein 8.3  6.0 - 8.3 g/dL    Albumin 3.9  3.5 - 5.2 g/dL    AST 49 (*) 0 - 37 U/L    ALT 44 (*) 0 - 35 U/L    Alkaline Phosphatase 110  39 - 117 U/L    Total Bilirubin 0.5  0.3 - 1.2 mg/dL    GFR calc non Af Amer 54 (*) >90 mL/min    GFR calc Af Amer 63 (*) >90 mL/min   CBC     Status: Normal   Collection Time   02/09/12  4:15 AM      Component Value Range Comment   WBC 9.3  4.0 - 10.5 K/uL    RBC 4.72  3.87 - 5.11 MIL/uL    Hemoglobin 12.8  12.0 - 15.0 g/dL    HCT 21.3  08.6 - 57.8 %    MCV 82.6  78.0 - 100.0 fL    MCH 27.1  26.0 - 34.0 pg    MCHC 32.8  30.0 - 36.0 g/dL    RDW 46.9  62.9 - 52.8 %    Platelets 270  150 - 400 K/uL   GLUCOSE, CAPILLARY     Status: Abnormal  Collection Time   02/09/12  8:23 AM      Component Value Range Comment   Glucose-Capillary 192 (*) 70 - 99 mg/dL   GLUCOSE, CAPILLARY     Status: Abnormal   Collection Time   02/09/12 12:41 PM      Component Value Range Comment   Glucose-Capillary 153 (*) 70 - 99 mg/dL   GLUCOSE, CAPILLARY     Status: Abnormal   Collection Time   02/09/12  4:14 PM      Component Value Range Comment   Glucose-Capillary 135 (*) 70 - 99 mg/dL   GLUCOSE, CAPILLARY     Status: Abnormal   Collection Time   02/09/12  8:11 PM      Component Value Range Comment   Glucose-Capillary 111 (*) 70 - 99 mg/dL    Comment 1 Documented in Chart      Comment 2 Notify RN     GLUCOSE, CAPILLARY     Status: Abnormal   Collection Time   02/09/12 11:57 PM      Component Value Range Comment   Glucose-Capillary 135 (*) 70 - 99 mg/dL    Comment 1 Notify RN      Comment 2 Documented in Chart     GLUCOSE, CAPILLARY     Status: Abnormal   Collection Time   02/10/12  4:01 AM      Component Value Range Comment   Glucose-Capillary 117 (*) 70 - 99 mg/dL    Comment 1 Notify RN      Comment 2 Documented in Chart     BASIC METABOLIC PANEL     Status: Abnormal   Collection Time   02/10/12  4:35 AM      Component Value Range Comment   Sodium 137  135 - 145 mEq/L    Potassium 3.3 (*) 3.5 - 5.1 mEq/L DELTA CHECK NOTED   Chloride 103  96 - 112 mEq/L    CO2 24  19 - 32 mEq/L    Glucose, Bld 135 (*) 70 - 99 mg/dL    BUN 14  6 - 23 mg/dL    Creatinine, Ser 1.61  0.50 - 1.10 mg/dL    Calcium 8.9  8.4 - 09.6 mg/dL    GFR calc non Af Amer 68 (*) >90 mL/min    GFR calc Af Amer 79 (*) >90 mL/min     Imaging / Studies: Dg Abd 1 View  02/09/2012  *RADIOLOGY REPORT*  Clinical Data: Abdominal pain.  Follow up small bowel obstruction.  ABDOMEN - 1 VIEW  Comparison: Acute abdomen series and CT abdomen and pelvis yesterday.  Findings: Persistent dilated, oral contrast filled loops of small bowel throughout the abdomen.  Gas and stool in normal caliber  colon.  Surgical mesh material overlying the upper pelvis. Contrast material within the normal-appearing urinary bladder. Nasogastric tube tip in the distal body or antrum of the stomach.  IMPRESSION: No significant change since yesterday in the partial small bowel obstruction.  The dilated loops of small bowel are predominantly fluid-filled and contain a small amount of residual oral contrast.   Original Report Authenticated By: Hulan Saas, M.D.    Ct Abdomen Pelvis W Contrast  02/08/2012  *RADIOLOGY REPORT*  Clinical Data: Abdominal pain, nausea, vomiting and diarrhea. History of small bowel obstruction.  The patient reports similar symptoms at the time of previous obstruction  CT ABDOMEN AND PELVIS WITH CONTRAST  Technique:  Multidetector CT imaging of the abdomen and pelvis was performed  following the standard protocol during bolus administration of intravenous contrast.  Contrast: OMNIPAQUE IOHEXOL 300 MG/ML  SOLN  Comparison: Radiographs obtained earlier today.  CT dated 10/10/2011.  Findings: Diffuse low density of the liver relative to the spleen is again demonstrated.  Interval dilated, fluid-filled loops of small bowel in the pelvis and mid and lower abdomen, with fecalization of some of the small bowel contents.  The small bowel tapers to a more normal caliber just proximal to a small bowel to the small bowel anastomosis in the upper right pelvis.  There is focal small bowel dilatation and fecalization of contents at the location of the anastomosis.  The small bowel loop extending from the region of the anastomosis is minimally dilated and tapers to a normal caliber distally.  No colonic dilatation is seen.  There are scattered colonic diverticula without evidence of diverticulitis.  No evidence of appendicitis.  Small amount of free peritoneal fluid in the pelvis.  Small accessory splenule.  The spleen, pancreas, gallbladder, adrenal glands, kidneys and urinary bladder are unremarkable.   Surgically absent uterus.  No adnexal masses or enlarged lymph nodes.  Hernia repair mesh.  A 4 mm subpleural nodular density in the left lower lobe on image number 1 is unchanged since 04/03/2009, compatible with a benign process.  Minimal linear atelectasis or scarring is noted at the right lung base.  Bilateral L5 pars interarticularis defects with associated grade 1 anterolisthesis at the L5-S1 level.  There is also moderate to marked disc space narrowing with a vacuum phenomenon, discogenic sclerosis and anterior spur formation at that level.  Milder degenerative changes elsewhere in the lumbar and lower thoracic spine.  IMPRESSION:  1.  Small bowel obstruction to the level of the right pelvis in the region of a small bowel to small bowel anastomosis.  This could be due to an adhesion or anastamotic scarring.  2.  Hepatic steatosis. 3.  Colonic diverticulosis. 4.  Stable bilateral L5 spondylolysis and associated grade 1 spondylolisthesis at the L5-S1 level.   Original Report Authenticated By: Beckie Salts, M.D.    Dg Abd Acute W/chest  02/08/2012  *RADIOLOGY REPORT*  Clinical Data: Diffuse abdominal pain, nausea and diarrhea.  ACUTE ABDOMEN SERIES (ABDOMEN 2 VIEW & CHEST 1 VIEW)  Comparison: Previous examinations.  Findings: Normal sized heart.  Diffusely prominent interstitial markings.  Small nodular density in the lateral aspect of the right upper lung zone without significant change since 12/13/2011.  This corresponds to a focal calcified pleural plaque or rib exostosis on the CT dated 01/11/2012. Normal bowel gas pattern without free peritoneal air.  Hernia fixation markers overlying the upper pelvis and lower abdomen.  Changes of osteitis pubis.  IMPRESSION:  1.  No acute abnormality. 2.  Chronic interstitial lung disease.   Original Report Authenticated By: Beckie Salts, M.D.     Medications / Allergies: per chart  Antibiotics: Anti-infectives    None

## 2012-02-10 NOTE — Progress Notes (Signed)
[facility] PROGRESS NOTE  ROSELYNN WHITACRE ZOX:[id number] DOB: 11/08/1955 DOA: 02/08/2012 PCP: Emeterio Reeve, MD  Brief narrative: Elinda Bunten is a 57 year old woman with a past medical history of diabetes, hypertension, myocardial infarction, and chronic pain as well as a prior history of multiple abdominal surgeries and small bowel obstruction who was admitted to the hospital 02/09/2012 with recurrent small bowel obstruction.  Assessment/Plan: Principal Problem:  *SBO (small bowel obstruction)  Admitted and placed on bowel rest. NG tube placed for gastric decompression. Surgical consultation requested.  F/U abdominal films done this morning showed a non-obstructive bowel gas pattern. Bowels moved after dulcolas suppository.  Mobilize as tolerated.  Minimize narcotics. Active Problems: Hypokalemia  Potassium added to IVF. AKI/dehydration  Baseline creatinine 0.8-1.0.  Creatinine back to usual baseline values status post hydration. Obesity (BMI 30-39.9)  NPO presently.  Hypertension  Atenolol currently on hold. Blood pressure stable.  Hyperlipidemia  Statin currently on hold.  Diabetes  Hemoglobin A1c 8%. Currently on every 4 hours sliding scale insulin. CBG is 111-153, good control.  Code Status: Full code Family Communication: None at bedside. Disposition Plan: Home when stable.   Medical Consultants:  Dr. Michaell Cowing, Surgery  Other Consultants:  None.  Anti-infectives:  None.  HPI/Subjective: Ms. Reifsteck is still having abdominal pain.  No N/V .  Bowels moved after dulcolax suppository this afternoon.    Objective: Filed Vitals:   02/09/12 1835 02/09/12 2140 02/10/12 0111 02/10/12 0510  BP: 132/75 118/68 121/68 138/81  Pulse: 72 68 73 69  Temp: 98.6 F (37 C) 98.7 F (37.1 C) 98.2 F (36.8 C) 98.7 F (37.1 C)  TempSrc: Oral Oral Oral Oral  Resp: 18 20 16 16   Height:      Weight:      SpO2: 92% 92% 93% 95%    Intake/Output Summary (Last 24  hours) at 02/10/12 0810 Last data filed at 02/10/12 0510  Gross per 24 hour  Intake   2075 ml  Output   2650 ml  Net   -575 ml    Exam: Gen:  NAD Cardiovascular:  RRR, No M/R/G Respiratory:  Lungs CTAB Gastrointestinal:  Abdomen soft, tender diffusely, + BS Extremities:  No C/E/C  Data Reviewed: Basic Metabolic Panel:  Lab 02/10/12 8119 02/09/12 0415 02/08/12 1730  NA 137 134* 137  K 3.3* 4.5 --  CL 103 99 101  CO2 24 22 22   GLUCOSE 135* 208* 228*  BUN 14 22 27*  CREATININE 0.92 1.11* 1.13*  CALCIUM 8.9 9.4 9.4  MG -- 2.1 --  PHOS -- 4.2 --   GFR Estimated Creatinine Clearance: 80.6 ml/min (by C-G formula based on Cr of 0.92). Liver Function Tests:  Lab 02/09/12 0415 02/08/12 1730  AST 49* 44*  ALT 44* 40*  ALKPHOS 110 113  BILITOT 0.5 0.5  PROT 8.3 8.2  ALBUMIN 3.9 3.8    Lab 02/08/12 1730  LIPASE 33  AMYLASE --   CBC:  Lab 02/09/12 0415 02/08/12 1730  WBC 9.3 6.2  NEUTROABS -- 3.5  HGB 12.8 13.5  HCT 39.0 39.5  MCV 82.6 80.9  PLT 270 270   CBG:  Lab 02/10/12 0401 02/09/12 2357 02/09/12 2011 02/09/12 1614 02/09/12 1241  GLUCAP 117* 135* 111* 135* 153*   Hgb A1c  Basename 02/08/12 1730  HGBA1C 8.0*   Microbiology No results found for this or any previous visit (from the past 240 hour(s)).   Procedures and Diagnostic Studies: Ct Abdomen Pelvis W Contrast  02/08/2012  *  RADIOLOGY REPORT*  Clinical Data: Abdominal pain, nausea, vomiting and diarrhea. History of small bowel obstruction.  The patient reports similar symptoms at the time of previous obstruction  CT ABDOMEN AND PELVIS WITH CONTRAST  Technique:  Multidetector CT imaging of the abdomen and pelvis was performed following the standard protocol during bolus administration of intravenous contrast.  Contrast: OMNIPAQUE IOHEXOL 300 MG/ML  SOLN  Comparison: Radiographs obtained earlier today.  CT dated 10/10/2011.  Findings: Diffuse low density of the liver relative to the spleen is again  demonstrated.  Interval dilated, fluid-filled loops of small bowel in the pelvis and mid and lower abdomen, with fecalization of some of the small bowel contents.  The small bowel tapers to a more normal caliber just proximal to a small bowel to the small bowel anastomosis in the upper right pelvis.  There is focal small bowel dilatation and fecalization of contents at the location of the anastomosis.  The small bowel loop extending from the region of the anastomosis is minimally dilated and tapers to a normal caliber distally.  No colonic dilatation is seen.  There are scattered colonic diverticula without evidence of diverticulitis.  No evidence of appendicitis.  Small amount of free peritoneal fluid in the pelvis.  Small accessory splenule.  The spleen, pancreas, gallbladder, adrenal glands, kidneys and urinary bladder are unremarkable.  Surgically absent uterus.  No adnexal masses or enlarged lymph nodes.  Hernia repair mesh.  A 4 mm subpleural nodular density in the left lower lobe on image number 1 is unchanged since 04/03/2009, compatible with a benign process.  Minimal linear atelectasis or scarring is noted at the right lung base.  Bilateral L5 pars interarticularis defects with associated grade 1 anterolisthesis at the L5-S1 level.  There is also moderate to marked disc space narrowing with a vacuum phenomenon, discogenic sclerosis and anterior spur formation at that level.  Milder degenerative changes elsewhere in the lumbar and lower thoracic spine.  IMPRESSION:  1.  Small bowel obstruction to the level of the right pelvis in the region of a small bowel to small bowel anastomosis.  This could be due to an adhesion or anastamotic scarring.  2.  Hepatic steatosis. 3.  Colonic diverticulosis. 4.  Stable bilateral L5 spondylolysis and associated grade 1 spondylolisthesis at the L5-S1 level.   Original Report Authenticated By: Beckie Salts, M.D.    Dg Abd Acute W/chest  02/08/2012  *RADIOLOGY REPORT*   Clinical Data: Diffuse abdominal pain, nausea and diarrhea.  ACUTE ABDOMEN SERIES (ABDOMEN 2 VIEW & CHEST 1 VIEW)  Comparison: Previous examinations.  Findings: Normal sized heart.  Diffusely prominent interstitial markings.  Small nodular density in the lateral aspect of the right upper lung zone without significant change since 12/13/2011.  This corresponds to a focal calcified pleural plaque or rib exostosis on the CT dated 01/11/2012. Normal bowel gas pattern without free peritoneal air.  Hernia fixation markers overlying the upper pelvis and lower abdomen.  Changes of osteitis pubis.  IMPRESSION:  1.  No acute abnormality. 2.  Chronic interstitial lung disease.   Original Report Authenticated By: Beckie Salts, M.D.    Dg Abd 1 View  02/09/2012  *RADIOLOGY REPORT*  Clinical Data: Abdominal pain.  Follow up small bowel obstruction.  ABDOMEN - 1 VIEW  Comparison: Acute abdomen series and CT abdomen and pelvis yesterday.  Findings: Persistent dilated, oral contrast filled loops of small bowel throughout the abdomen.  Gas and stool in normal caliber colon.  Surgical mesh material  overlying the upper pelvis. Contrast material within the normal-appearing urinary bladder. Nasogastric tube tip in the distal body or antrum of the stomach.  IMPRESSION: No significant change since yesterday in the partial small bowel obstruction.  The dilated loops of small bowel are predominantly fluid-filled and contain a small amount of residual oral contrast.   Original Report Authenticated By: Hulan Saas, M.D.    Dg Abd 2 Views  02/10/2012  *RADIOLOGY REPORT*  Clinical Data: Abdominal pain  ABDOMEN - 2 VIEW  Comparison: 02/09/2012  Findings: Nasogastric tube appropriately positioned.  Residual contrast within nondilated colon is noted.  Abdominal mesh is present.  Left lower lobe patchy airspace opacity is new since prior.  No dilated loop of bowel.  No free air beneath the diaphragms.  No air fluid level.  IMPRESSION:  Nonobstructive bowel gas pattern.  Patchy left lower lobe airspace opacity, possibly atelectasis although early pneumonia could have a similar appearance.   Original Report Authenticated By: Christiana Pellant, M.D.     Scheduled Meds:    . bisacodyl  10 mg Rectal Daily  . chlorhexidine  15 mL Mouth/Throat QID  . insulin aspart  0-9 Units Subcutaneous Q4H  . lip balm  1 application Topical BID  . pantoprazole (PROTONIX) IV  40 mg Intravenous QHS  . potassium chloride  10 mEq Intravenous Q1 Hr x 4  . sodium chloride  1,000 mL Intravenous Once   Continuous Infusions:    . dextrose 5 % and 0.9 % NaCl with KCl 40 mEq/L      Time spent: 25 minutes.   LOS: 2 days   Jorge Amparo  [facility] Pager [phone].  If 8PM-8AM, please contact night-coverage at [email or web address], password [facility] 02/10/2012, 8:10 AM

## 2012-02-10 NOTE — Progress Notes (Signed)
Dr. Donell Beers aware pt had BM following am Dulcolax suppository. MD aware soap suds enema ordered earlier this am. See order to dc soap suds enema.

## 2012-02-11 LAB — GLUCOSE, CAPILLARY
Glucose-Capillary: 159 mg/dL — ABNORMAL HIGH (ref 70–99)
Glucose-Capillary: 148 mg/dL — ABNORMAL HIGH (ref 70–99)
Glucose-Capillary: 129 mg/dL — ABNORMAL HIGH (ref 70–99)

## 2012-02-11 LAB — CBC
MCHC: 33.1 g/dL (ref 30.0–36.0)
RDW: 14 % (ref 11.5–15.5)
WBC: 5.8 10*3/uL (ref 4.0–10.5)

## 2012-02-11 LAB — CREATININE, SERUM: Creatinine, Ser: 0.93 mg/dL (ref 0.50–1.10)

## 2012-02-11 LAB — POTASSIUM: Potassium: 3.9 mEq/L (ref 3.5–5.1)

## 2012-02-11 NOTE — Progress Notes (Signed)
  Subjective: Had small BM.  Still with large amount from NG and intermittent upper abdominal pain  Objective: Vital signs in last 24 hours: Temp:  [98 F (36.7 C)-98.6 F (37 C)] 98.6 F (37 C) (01/06 0540) Pulse Rate:  [69-81] 73  (01/06 0540) Resp:  [12-20] 12  (01/06 0540) BP: (157-219)/(88-114) 160/88 mmHg (01/06 0540) SpO2:  [90 %-96 %] 93 % (01/06 0540) Last BM Date: 02/08/12  Intake/Output from previous day: 02/25/2022 0701 - 01/06 0700 In: 2639.2 [P.O.:60; I.V.:2579.2] Out: 2801 [Urine:2501; Emesis/NG output:300] Intake/Output this shift:    General appearance: alert, cooperative and no distress GI: soft, no significant tenderness on exam, ND, no peritoneal signs, NG with bilious output.  Lab Results:   Basename 02/11/12 0434 02/09/12 0415  WBC 5.8 9.3  HGB 11.7* 12.8  HCT 35.3* 39.0  PLT 211 270   BMET  Basename 02/11/12 0434 2012/02/26 0435 02/09/12 0415  NA -- 137 134*  K 3.9 3.3* --  CL -- 103 99  CO2 -- 24 22  GLUCOSE -- 135* 208*  BUN -- 14 22  CREATININE 0.93 0.92 --  CALCIUM -- 8.9 9.4   PT/INR No results found for this basename: LABPROT:2,INR:2 in the last 72 hours ABG No results found for this basename: PHART:2,PCO2:2,PO2:2,HCO3:2 in the last 72 hours  Studies/Results: Dg Abd 2 Views  2012-02-26  *RADIOLOGY REPORT*  Clinical Data: Abdominal pain  ABDOMEN - 2 VIEW  Comparison: 02/09/2012  Findings: Nasogastric tube appropriately positioned.  Residual contrast within nondilated colon is noted.  Abdominal mesh is present.  Left lower lobe patchy airspace opacity is new since prior.  No dilated loop of bowel.  No free air beneath the diaphragms.  No air fluid level.  IMPRESSION: Nonobstructive bowel gas pattern.  Patchy left lower lobe airspace opacity, possibly atelectasis although early pneumonia could have a similar appearance.   Original Report Authenticated By: Christiana Pellant, M.D.     Anti-infectives: Anti-infectives    None       Assessment/Plan: s/p * No surgery found * small bowel movement but not passing flatus.  Contrast appears to be in colon and no distension on exam.  She still has a lot of NG output but no obvious obstruction.  I would probably leave NG one more day and consider clamping tomorrow.  Follow NG output and clinical exam.    LOS: 3 days    Lodema Pilot DAVID 02/11/2012

## 2012-02-11 NOTE — Care Management Note (Signed)
    Page 1 of 1   02/11/2012     11:02:16 AM   CARE MANAGEMENT NOTE 02/11/2012  Patient:  [name], [name]   Account Number:  [id number]  Date Initiated:  02/11/2012  Documentation initiated by:  Lorenda Ishihara  Subjective/Objective Assessment:   57 yo female admitted with SBO. PTA lived at home alone.     Action/Plan:   Home when stable   Anticipated DC Date:  02/14/2012   Anticipated DC Plan:  HOME/SELF CARE      DC Planning Services  CM consult      Choice offered to / List presented to:             Status of service:  Completed, signed off Medicare Important Message given?   (If response is "NO", the following Medicare IM given date fields will be blank) Date Medicare IM given:   Date Additional Medicare IM given:    Discharge Disposition:  HOME/SELF CARE  Per UR Regulation:  Reviewed for med. necessity/level of care/duration of stay  If discussed at Long Length of Stay Meetings, dates discussed:    Comments:

## 2012-02-11 NOTE — Progress Notes (Addendum)
[facility] PROGRESS NOTE  [name] ZOX:[id number] DOB: 17-Jun-1955 DOA: 02/08/2012 PCP: Emeterio Reeve, MD  Brief narrative: [name] is a 57 year old woman with a past medical history of diabetes, hypertension, myocardial infarction, and chronic pain as well as a prior history of multiple abdominal surgeries and small bowel obstruction who was admitted to the hospital 02/09/2012 with recurrent small bowel obstruction.  Assessment/Plan: Principal Problem:  *SBO (small bowel obstruction)  Admitted and placed on bowel rest. NG tube placed for gastric decompression. Surgical consultation performed.  F/U abdominal films done 02/10/2012 showed a non-obstructive bowel gas pattern. Bowels moved after dulcolax suppository.  Mobilize as tolerated.  Minimize narcotics. Active Problems: Hypokalemia  Potassium added to IVF. Potassium 3.9 today. AKI/dehydration  Baseline creatinine 0.8-1.0.  Creatinine back to usual baseline values status post hydration. Obesity (BMI 30-39.9)  NPO presently.  Hypertension  Atenolol currently on hold. Blood pressure stable.  Hyperlipidemia  Statin currently on hold.  Diabetes  Hemoglobin A1c 8%. Currently on every 4 hours sliding scale insulin. CBG is 137-159, good control.  Code Status: Full code Family Communication: None at bedside. Disposition Plan: Home when stable.   Medical Consultants:  Dr. Michaell Cowing, Surgery  Other Consultants:  None.  Anti-infectives:  None.  HPI/Subjective: [name] is still having abdominal pain.  No N/V .  States she is passing flatus and that her bowels moved yesterday.    Objective: Filed Vitals:   02/10/12 1335 02/10/12 2155 02/10/12 2202 02/11/12 0540  BP: 171/92 219/114 163/93 160/88  Pulse: 72 81  73  Temp: 98.1 F (36.7 C) 98 F (36.7 C)  98.6 F (37 C)  TempSrc: Oral Oral  Oral  Resp: 16 20  12   Height:      Weight:      SpO2: 96% 91%  93%    Intake/Output Summary (Last 24  hours) at 02/11/12 0758 Last data filed at 02/11/12 0600  Gross per 24 hour  Intake 2639.16 ml  Output   2801 ml  Net -161.84 ml    Exam: Gen:  NAD Cardiovascular:  RRR, No M/R/G Respiratory:  Lungs CTAB Gastrointestinal:  Abdomen soft, tender diffusely, + BS Extremities:  No C/E/C  Data Reviewed: Basic Metabolic Panel:  Lab 02/11/12 8119 02/10/12 0435 02/09/12 0415 02/08/12 1730  NA -- 137 134* 137  K 3.9 3.3* -- --  CL -- 103 99 101  CO2 -- 24 22 22   GLUCOSE -- 135* 208* 228*  BUN -- 14 22 27*  CREATININE 0.93 0.92 1.11* 1.13*  CALCIUM -- 8.9 9.4 9.4  MG -- -- 2.1 --  PHOS -- -- 4.2 --   GFR Estimated Creatinine Clearance: 79.8 ml/min (by C-G formula based on Cr of 0.93). Liver Function Tests:  Lab 02/09/12 0415 02/08/12 1730  AST 49* 44*  ALT 44* 40*  ALKPHOS 110 113  BILITOT 0.5 0.5  PROT 8.3 8.2  ALBUMIN 3.9 3.8    Lab 02/08/12 1730  LIPASE 33  AMYLASE --   CBC:  Lab 02/11/12 0434 02/09/12 0415 02/08/12 1730  WBC 5.8 9.3 6.2  NEUTROABS -- -- 3.5  HGB 11.7* 12.8 13.5  HCT 35.3* 39.0 39.5  MCV 82.3 82.6 80.9  PLT 211 270 270   CBG:  Lab 02/11/12 0734 02/11/12 0400 02/10/12 2345 02/10/12 2006 02/10/12 1612  GLUCAP 148* 159* 146* 137* 144*   Hgb A1c  Basename 02/08/12 1730  HGBA1C 8.0*   Microbiology No results found for this or any previous visit (  from the past 240 hour(s)).   Procedures and Diagnostic Studies: Ct Abdomen Pelvis W Contrast  02/08/2012  *RADIOLOGY REPORT*  Clinical Data: Abdominal pain, nausea, vomiting and diarrhea. History of small bowel obstruction.  The patient reports similar symptoms at the time of previous obstruction  CT ABDOMEN AND PELVIS WITH CONTRAST  Technique:  Multidetector CT imaging of the abdomen and pelvis was performed following the standard protocol during bolus administration of intravenous contrast.  Contrast: OMNIPAQUE IOHEXOL 300 MG/ML  SOLN  Comparison: Radiographs obtained earlier today.  CT  dated 10/10/2011.  Findings: Diffuse low density of the liver relative to the spleen is again demonstrated.  Interval dilated, fluid-filled loops of small bowel in the pelvis and mid and lower abdomen, with fecalization of some of the small bowel contents.  The small bowel tapers to a more normal caliber just proximal to a small bowel to the small bowel anastomosis in the upper right pelvis.  There is focal small bowel dilatation and fecalization of contents at the location of the anastomosis.  The small bowel loop extending from the region of the anastomosis is minimally dilated and tapers to a normal caliber distally.  No colonic dilatation is seen.  There are scattered colonic diverticula without evidence of diverticulitis.  No evidence of appendicitis.  Small amount of free peritoneal fluid in the pelvis.  Small accessory splenule.  The spleen, pancreas, gallbladder, adrenal glands, kidneys and urinary bladder are unremarkable.  Surgically absent uterus.  No adnexal masses or enlarged lymph nodes.  Hernia repair mesh.  A 4 mm subpleural nodular density in the left lower lobe on image number 1 is unchanged since 04/03/2009, compatible with a benign process.  Minimal linear atelectasis or scarring is noted at the right lung base.  Bilateral L5 pars interarticularis defects with associated grade 1 anterolisthesis at the L5-S1 level.  There is also moderate to marked disc space narrowing with a vacuum phenomenon, discogenic sclerosis and anterior spur formation at that level.  Milder degenerative changes elsewhere in the lumbar and lower thoracic spine.  IMPRESSION:  1.  Small bowel obstruction to the level of the right pelvis in the region of a small bowel to small bowel anastomosis.  This could be due to an adhesion or anastamotic scarring.  2.  Hepatic steatosis. 3.  Colonic diverticulosis. 4.  Stable bilateral L5 spondylolysis and associated grade 1 spondylolisthesis at the L5-S1 level.   Original Report  Authenticated By: Beckie Salts, M.D.    Dg Abd Acute W/chest  02/08/2012  *RADIOLOGY REPORT*  Clinical Data: Diffuse abdominal pain, nausea and diarrhea.  ACUTE ABDOMEN SERIES (ABDOMEN 2 VIEW & CHEST 1 VIEW)  Comparison: Previous examinations.  Findings: Normal sized heart.  Diffusely prominent interstitial markings.  Small nodular density in the lateral aspect of the right upper lung zone without significant change since 12/13/2011.  This corresponds to a focal calcified pleural plaque or rib exostosis on the CT dated 01/11/2012. Normal bowel gas pattern without free peritoneal air.  Hernia fixation markers overlying the upper pelvis and lower abdomen.  Changes of osteitis pubis.  IMPRESSION:  1.  No acute abnormality. 2.  Chronic interstitial lung disease.   Original Report Authenticated By: Beckie Salts, M.D.    Dg Abd 1 View  02/09/2012  *RADIOLOGY REPORT*  Clinical Data: Abdominal pain.  Follow up small bowel obstruction.  ABDOMEN - 1 VIEW  Comparison: Acute abdomen series and CT abdomen and pelvis yesterday.  Findings: Persistent dilated, oral contrast filled  loops of small bowel throughout the abdomen.  Gas and stool in normal caliber colon.  Surgical mesh material overlying the upper pelvis. Contrast material within the normal-appearing urinary bladder. Nasogastric tube tip in the distal body or antrum of the stomach.  IMPRESSION: No significant change since yesterday in the partial small bowel obstruction.  The dilated loops of small bowel are predominantly fluid-filled and contain a small amount of residual oral contrast.   Original Report Authenticated By: Hulan Saas, M.D.    Dg Abd 2 Views  02/10/2012  *RADIOLOGY REPORT*  Clinical Data: Abdominal pain  ABDOMEN - 2 VIEW  Comparison: 02/09/2012  Findings: Nasogastric tube appropriately positioned.  Residual contrast within nondilated colon is noted.  Abdominal mesh is present.  Left lower lobe patchy airspace opacity is new since prior.  No  dilated loop of bowel.  No free air beneath the diaphragms.  No air fluid level.  IMPRESSION: Nonobstructive bowel gas pattern.  Patchy left lower lobe airspace opacity, possibly atelectasis although early pneumonia could have a similar appearance.   Original Report Authenticated By: Christiana Pellant, M.D.     Scheduled Meds:    . bisacodyl  10 mg Rectal Daily  . chlorhexidine  15 mL Mouth/Throat QID  . insulin aspart  0-9 Units Subcutaneous Q4H  . lip balm  1 application Topical BID  . pantoprazole (PROTONIX) IV  40 mg Intravenous Q12H   Continuous Infusions:    . dextrose 5 % and 0.9 % NaCl with KCl 40 mEq/L 125 mL/hr at 02/11/12 0202    Time spent: 25 minutes.   LOS: 3 days   [name],[name]  [facility] Pager [phone].  If 8PM-8AM, please contact night-coverage at [email or web address], password [facility] 02/11/2012, 7:58 AM

## 2012-02-12 ENCOUNTER — Inpatient Hospital Stay (HOSPITAL_COMMUNITY): Payer: Managed Care, Other (non HMO)

## 2012-02-12 LAB — POTASSIUM: Potassium: 4 mEq/L (ref 3.5–5.1)

## 2012-02-12 LAB — CBC
Platelets: 219 10*3/uL (ref 150–400)
RBC: 4.38 MIL/uL (ref 3.87–5.11)
WBC: 5.1 10*3/uL (ref 4.0–10.5)

## 2012-02-12 LAB — GLUCOSE, CAPILLARY
Glucose-Capillary: 140 mg/dL — ABNORMAL HIGH (ref 70–99)
Glucose-Capillary: 155 mg/dL — ABNORMAL HIGH (ref 70–99)
Glucose-Capillary: 143 mg/dL — ABNORMAL HIGH (ref 70–99)

## 2012-02-12 LAB — CREATININE, SERUM
GFR calc Af Amer: 83 mL/min — ABNORMAL LOW (ref >90)
GFR calc non Af Amer: 72 mL/min — ABNORMAL LOW (ref >90)

## 2012-02-12 NOTE — Progress Notes (Signed)
Subjective: Says she's passing gas, pain is about what she seems to have chronically at home.  She has some spasms which may be worse.    Objective: Vital signs in last 24 hours: Temp:  [98.1 F (36.7 C)-98.9 F (37.2 C)] 98.1 F (36.7 C) 12-Mar-2022 0523) Pulse Rate:  [69-83] 83  2022/03/12 0523) Resp:  [20] 20  12-Mar-2022 0523) BP: (180-182)/(81-93) 180/81 mmHg 03/12/2022 0523) SpO2:  [93 %-95 %] 93 % 2022-03-12 0523) Last BM Date: 02/11/12  Diet: NPO, 1340 from NG reported yesterday, afebrile, BP is up, HR normal, labs OK, Films show contrast and gas in the colon  Intake/Output from previous day: 01/06 0701 - 03-12-2022 0700 In: 1405 [I.V.:1375; NG/GT:30] Out: 3090 [Urine:1750; Emesis/NG output:1340] Intake/Output this shift: Total I/O In: 0  Out: 600 [Urine:600]  General appearance: alert, cooperative and no distress GI: soft, tender, not more than she normally complains of , few BS, + flatus  Lab Results:   Basename 12-Mar-2012 0415 02/11/12 0434  WBC 5.1 5.8  HGB 12.1 11.7*  HCT 35.8* 35.3*  PLT 219 211    BMET  Basename 12-Mar-2012 0415 02/11/12 0434 02/10/12 0435  NA -- -- 137  K 4.0 3.9 --  CL -- -- 103  CO2 -- -- 24  GLUCOSE -- -- 135*  BUN -- -- 14  CREATININE 0.88 0.93 --  CALCIUM -- -- 8.9   PT/INR No results found for this basename: LABPROT:2,INR:2 in the last 72 hours   Lab 02/09/12 0415 02/08/12 1730  AST 49* 44*  ALT 44* 40*  ALKPHOS 110 113  BILITOT 0.5 0.5  PROT 8.3 8.2  ALBUMIN 3.9 3.8     Lipase     Component Value Date/Time   LIPASE 33 02/08/2012 1730     Studies/Results: Dg Abd 2 Views  03-12-12  *RADIOLOGY REPORT*  Clinical Data: Upper abdominal pain.  ABDOMEN - 2 VIEW  Comparison: 02/10/2012  Findings: Upright and supine views of the abdomen were obtained. There is no evidence for free air.  Nasogastric tube in the stomach region.  There is oral contrast in the colon.  There is a surgical mesh material in the lower abdomen.  There is gas within the  small and large bowel.  Intramedullary nail in the right femur.  Cannot exclude left basilar lung densities.  IMPRESSION: Nonspecific bowel gas pattern.  There is oral contrast and gas within the colon.  Probable left basilar atelectasis.   Original Report Authenticated By: Richarda Overlie, M.D.     Medications:    . bisacodyl  10 mg Rectal Daily  . chlorhexidine  15 mL Mouth/Throat QID  . insulin aspart  0-9 Units Subcutaneous Q4H  . lip balm  1 application Topical BID  . pantoprazole (PROTONIX) IV  40 mg Intravenous Q12H      . dextrose 5 % and 0.9 % NaCl with KCl 40 mEq/L 125 mL/hr at 2012-03-12 1005   Prior to Admission medications   Medication Sig Start Date End Date Taking? Authorizing Provider  atenolol (TENORMIN) 100 MG tablet Take 100 mg by mouth daily.   Yes Historical Provider, MD  HYDROcodone-acetaminophen (VICODIN) 5-500 MG per tablet Take 1 tablet by mouth at bedtime as needed. For pain   Yes Historical Provider, MD  insulin NPH (HUMULIN N,NOVOLIN N) 100 UNIT/ML injection Inject 5-15 Units into the skin. 15 in the am and 5 at night   Yes Historical Provider, MD  metFORMIN (GLUCOPHAGE) 500 MG tablet Take 500 mg  by mouth 2 (two) times daily with a meal.   Yes Historical Provider, MD  potassium chloride (K-DUR) 10 MEQ tablet Take 10 mEq by mouth daily.   Yes Historical Provider, MD  rosuvastatin (CRESTOR) 40 MG tablet Take 40 mg by mouth daily.   Yes Historical Provider, MD      Assessment/Plan SBO, hx of incarcerated Ventral hernia repair 2009 SG with SBO in 2011, and 2012. Hx of hysterectomy and 2 C sections Chronic pain, since 2008 AODM Hx of MI Body mass index is 35.7   Plan:  Mobilize, and try some clamping trials, contrast is in the colon.    LOS: 4 days    JENNINGS,WILLARD 02/12/2012  Tolerating tube clamping.  Seems to be improving.

## 2012-02-12 NOTE — Progress Notes (Signed)
[facility] PROGRESS NOTE  [name] ZOX:[id number] DOB: 11/17/1955 DOA: 02/08/2012 PCP: Emeterio Reeve, MD  Brief narrative: [name] is a 57 year old woman with a past medical history of diabetes, hypertension, myocardial infarction, and chronic pain as well as a prior history of multiple abdominal surgeries and small bowel obstruction who was admitted to the hospital 02/09/2012 with recurrent small bowel obstruction.  Assessment/Plan: Principal Problem:  *SBO (small bowel obstruction)  Admitted and placed on bowel rest. NG tube placed for gastric decompression. Surgical consultation performed.  F/U abdominal films done 02/10/2012 showed a non-obstructive bowel gas pattern and films done 02/12/2012 showed contrast in the colon. For NG clamping trial and clear liquids today.  Mobilize as tolerated.  Minimize narcotics. Active Problems: Hypokalemia  Resolved. Potassium added to IVF.  AKI/dehydration  Baseline creatinine 0.8-1.0.  Creatinine back to usual baseline values status post hydration. Obesity (BMI 30-39.9)  Clear liquids today.  Hypertension  Atenolol currently on hold. Blood pressure stable.  Hyperlipidemia  Statin currently on hold.  Diabetes  Hemoglobin A1c 8%. Currently on every 4 hours sliding scale insulin. CBG is 137-159, good control.  Code Status: Full code Family Communication: None at bedside. Disposition Plan: Home when stable.   Medical Consultants:  Dr. Michaell Cowing, Surgery  Other Consultants:  None.  Anti-infectives:  None.  HPI/Subjective: [name] is still having abdominal pain.  No N/V .  States she is passing flatus.    Objective: Filed Vitals:   02/11/12 1400 02/11/12 2113 02/12/12 0523 02/12/12 1400  BP: 182/92 182/93 180/81 181/92  Pulse: 69 73 83 72  Temp: 98.4 F (36.9 C) 98.9 F (37.2 C) 98.1 F (36.7 C) 99.1 F (37.3 C)  TempSrc: Oral Oral Oral Oral  Resp: 20 20 20 18   Height:      Weight:      SpO2: 95%  95% 93% 95%    Intake/Output Summary (Last 24 hours) at 02/12/12 1514 Last data filed at 02/12/12 1400  Gross per 24 hour  Intake    375 ml  Output   3140 ml  Net  -2765 ml    Exam: Gen:  NAD Cardiovascular:  RRR, No M/R/G Respiratory:  Lungs CTAB Gastrointestinal:  Abdomen soft, tender diffusely, + BS Extremities:  No C/E/C  Data Reviewed: Basic Metabolic Panel:  Lab 02/12/12 8119 02/11/12 0434 02/10/12 0435 02/09/12 0415 02/08/12 1730  NA -- -- 137 134* 137  K 4.0 3.9 -- -- --  CL -- -- 103 99 101  CO2 -- -- 24 22 22   GLUCOSE -- -- 135* 208* 228*  BUN -- -- 14 22 27*  CREATININE 0.88 0.93 0.92 1.11* 1.13*  CALCIUM -- -- 8.9 9.4 9.4  MG -- -- -- 2.1 --  PHOS -- -- -- 4.2 --   GFR Estimated Creatinine Clearance: 84.3 ml/min (by C-G formula based on Cr of 0.88). Liver Function Tests:  Lab 02/09/12 0415 02/08/12 1730  AST 49* 44*  ALT 44* 40*  ALKPHOS 110 113  BILITOT 0.5 0.5  PROT 8.3 8.2  ALBUMIN 3.9 3.8    Lab 02/08/12 1730  LIPASE 33  AMYLASE --   CBC:  Lab 02/12/12 0415 02/11/12 0434 02/09/12 0415 02/08/12 1730  WBC 5.1 5.8 9.3 6.2  NEUTROABS -- -- -- 3.5  HGB 12.1 11.7* 12.8 13.5  HCT 35.8* 35.3* 39.0 39.5  MCV 81.7 82.3 82.6 80.9  PLT 219 211 270 270   CBG:  Lab 02/12/12 1126 02/12/12 0722 02/12/12 0350 02/11/12  2332 02/11/12 1930  GLUCAP 140* 168* 141* 158* 146*    Procedures and Diagnostic Studies: Ct Abdomen Pelvis W Contrast  02/08/2012  *RADIOLOGY REPORT*  Clinical Data: Abdominal pain, nausea, vomiting and diarrhea. History of small bowel obstruction.  The patient reports similar symptoms at the time of previous obstruction  CT ABDOMEN AND PELVIS WITH CONTRAST  Technique:  Multidetector CT imaging of the abdomen and pelvis was performed following the standard protocol during bolus administration of intravenous contrast.  Contrast: OMNIPAQUE IOHEXOL 300 MG/ML  SOLN  Comparison: Radiographs obtained earlier today.  CT dated 10/10/2011.   Findings: Diffuse low density of the liver relative to the spleen is again demonstrated.  Interval dilated, fluid-filled loops of small bowel in the pelvis and mid and lower abdomen, with fecalization of some of the small bowel contents.  The small bowel tapers to a more normal caliber just proximal to a small bowel to the small bowel anastomosis in the upper right pelvis.  There is focal small bowel dilatation and fecalization of contents at the location of the anastomosis.  The small bowel loop extending from the region of the anastomosis is minimally dilated and tapers to a normal caliber distally.  No colonic dilatation is seen.  There are scattered colonic diverticula without evidence of diverticulitis.  No evidence of appendicitis.  Small amount of free peritoneal fluid in the pelvis.  Small accessory splenule.  The spleen, pancreas, gallbladder, adrenal glands, kidneys and urinary bladder are unremarkable.  Surgically absent uterus.  No adnexal masses or enlarged lymph nodes.  Hernia repair mesh.  A 4 mm subpleural nodular density in the left lower lobe on image number 1 is unchanged since 04/03/2009, compatible with a benign process.  Minimal linear atelectasis or scarring is noted at the right lung base.  Bilateral L5 pars interarticularis defects with associated grade 1 anterolisthesis at the L5-S1 level.  There is also moderate to marked disc space narrowing with a vacuum phenomenon, discogenic sclerosis and anterior spur formation at that level.  Milder degenerative changes elsewhere in the lumbar and lower thoracic spine.  IMPRESSION:  1.  Small bowel obstruction to the level of the right pelvis in the region of a small bowel to small bowel anastomosis.  This could be due to an adhesion or anastamotic scarring.  2.  Hepatic steatosis. 3.  Colonic diverticulosis. 4.  Stable bilateral L5 spondylolysis and associated grade 1 spondylolisthesis at the L5-S1 level.   Original Report Authenticated By: Beckie Salts, M.D.    Dg Abd Acute W/chest  02/08/2012  *RADIOLOGY REPORT*  Clinical Data: Diffuse abdominal pain, nausea and diarrhea.  ACUTE ABDOMEN SERIES (ABDOMEN 2 VIEW & CHEST 1 VIEW)  Comparison: Previous examinations.  Findings: Normal sized heart.  Diffusely prominent interstitial markings.  Small nodular density in the lateral aspect of the right upper lung zone without significant change since 12/13/2011.  This corresponds to a focal calcified pleural plaque or rib exostosis on the CT dated 01/11/2012. Normal bowel gas pattern without free peritoneal air.  Hernia fixation markers overlying the upper pelvis and lower abdomen.  Changes of osteitis pubis.  IMPRESSION:  1.  No acute abnormality. 2.  Chronic interstitial lung disease.   Original Report Authenticated By: Beckie Salts, M.D.    Dg Abd 1 View  02/09/2012  *RADIOLOGY REPORT*  Clinical Data: Abdominal pain.  Follow up small bowel obstruction.  ABDOMEN - 1 VIEW  Comparison: Acute abdomen series and CT abdomen and pelvis yesterday.  Findings: Persistent dilated, oral contrast filled loops of small bowel throughout the abdomen.  Gas and stool in normal caliber colon.  Surgical mesh material overlying the upper pelvis. Contrast material within the normal-appearing urinary bladder. Nasogastric tube tip in the distal body or antrum of the stomach.  IMPRESSION: No significant change since yesterday in the partial small bowel obstruction.  The dilated loops of small bowel are predominantly fluid-filled and contain a small amount of residual oral contrast.   Original Report Authenticated By: Hulan Saas, M.D.    Dg Abd 2 Views  02/10/2012  *RADIOLOGY REPORT*  Clinical Data: Abdominal pain  ABDOMEN - 2 VIEW  Comparison: 02/09/2012  Findings: Nasogastric tube appropriately positioned.  Residual contrast within nondilated colon is noted.  Abdominal mesh is present.  Left lower lobe patchy airspace opacity is new since prior.  No dilated loop of bowel.  No free  air beneath the diaphragms.  No air fluid level.  IMPRESSION: Nonobstructive bowel gas pattern.  Patchy left lower lobe airspace opacity, possibly atelectasis although early pneumonia could have a similar appearance.   Original Report Authenticated By: Christiana Pellant, M.D.    Dg Abd 2 Views  02/12/2012  *RADIOLOGY REPORT*  Clinical Data: Upper abdominal pain.  ABDOMEN - 2 VIEW  Comparison: 02/10/2012  Findings: Upright and supine views of the abdomen were obtained. There is no evidence for free air.  Nasogastric tube in the stomach region.  There is oral contrast in the colon.  There is a surgical mesh material in the lower abdomen.  There is gas within the small and large bowel.  Intramedullary nail in the right femur.  Cannot exclude left basilar lung densities.  IMPRESSION: Nonspecific bowel gas pattern.  There is oral contrast and gas within the colon.  Probable left basilar atelectasis.   Original Report Authenticated By: Richarda Overlie, M.D.    Scheduled Meds:    . bisacodyl  10 mg Rectal Daily  . chlorhexidine  15 mL Mouth/Throat QID  . insulin aspart  0-9 Units Subcutaneous Q4H  . lip balm  1 application Topical BID  . pantoprazole (PROTONIX) IV  40 mg Intravenous Q12H   Continuous Infusions:    . dextrose 5 % and 0.9 % NaCl with KCl 40 mEq/L 125 mL/hr at 02/12/12 1005    Time spent: 25 minutes.   LOS: 4 days   RAMA,CHRISTINA  [facility] Pager [phone].  If 8PM-8AM, please contact night-coverage at [email or web address], password [facility] 02/12/2012, 3:14 PM

## 2012-02-13 DIAGNOSIS — N179 Acute kidney failure, unspecified: Secondary | ICD-10-CM

## 2012-02-13 DIAGNOSIS — E669 Obesity, unspecified: Secondary | ICD-10-CM

## 2012-02-13 LAB — POTASSIUM: Potassium: 3.8 mEq/L (ref 3.5–5.1)

## 2012-02-13 LAB — CREATININE, SERUM
Creatinine, Ser: 0.89 mg/dL (ref 0.50–1.10)
GFR calc Af Amer: 82 mL/min — ABNORMAL LOW (ref >90)
GFR calc non Af Amer: 71 mL/min — ABNORMAL LOW (ref >90)

## 2012-02-13 LAB — CBC
Hemoglobin: 11.7 g/dL — ABNORMAL LOW (ref 12.0–15.0)
MCH: 27.1 pg (ref 26.0–34.0)
MCHC: 32.6 g/dL (ref 30.0–36.0)
MCV: 83.1 fL (ref 78.0–100.0)
Platelets: 242 10*3/uL (ref 150–400)

## 2012-02-13 LAB — GLUCOSE, CAPILLARY
Glucose-Capillary: 141 mg/dL — ABNORMAL HIGH (ref 70–99)
Glucose-Capillary: 148 mg/dL — ABNORMAL HIGH (ref 70–99)

## 2012-02-13 MED ORDER — ATENOLOL 50 MG PO TABS
50.0000 mg | ORAL_TABLET | Freq: Two times a day (BID) | ORAL | Status: DC
  Administered 2012-02-13 – 2012-02-15 (×5): 50 mg via ORAL
  Filled 2012-02-13 (×6): qty 1

## 2012-02-13 MED ORDER — HYDROCODONE-ACETAMINOPHEN 5-325 MG PO TABS
1.0000 | ORAL_TABLET | ORAL | Status: DC | PRN
  Administered 2012-02-14 (×2): 1 via ORAL
  Administered 2012-02-14: 2 via ORAL
  Filled 2012-02-13: qty 1
  Filled 2012-02-13: qty 2
  Filled 2012-02-13: qty 1
  Filled 2012-02-13: qty 2

## 2012-02-13 MED ORDER — ENOXAPARIN SODIUM 40 MG/0.4ML ~~LOC~~ SOLN
40.0000 mg | SUBCUTANEOUS | Status: DC
  Administered 2012-02-13 – 2012-02-15 (×3): 40 mg via SUBCUTANEOUS
  Filled 2012-02-13 (×3): qty 0.4

## 2012-02-13 NOTE — Progress Notes (Signed)
Had some clears, and no nausea, she did complain of her chronic pain.  I placed her back on suction with no more than 20 ml from NG.  Pt was agreeable to removing NG, which I did.  I will let nursing advance her to full liquids if she likes, and decrease IV to [facility].  I am going to stop her dilaudid also.  She also report a BM at 3:00 PM.

## 2012-02-13 NOTE — Progress Notes (Signed)
Subjective: Complains of the usual pain, no nausea or vomiting with NG clamped.  Walking some.  Objective: Vital signs in last 24 hours: Temp:  [98.7 F (37.1 C)-99.1 F (37.3 C)] 98.7 F (37.1 C) (01/08 0509) Pulse Rate:  [72-80] 80  (01/08 0509) Resp:  [18-20] 20  24-Feb-2022 2121) BP: (148-181)/(78-92) 148/78 mmHg (01/08 0509) SpO2:  [94 %-96 %] 96 % (01/08 0509) Last BM Date: 02/11/12  200 ml from NG reported yesterday, stool recorded 02/11/12, NPO/ice chips TM 99, BP up some, lab OK  Intake/Output from previous day: 02-24-22 0701 - 01/08 0700 In: 4666.7 [I.V.:4666.7] Out: 2600 [Urine:2400; Emesis/NG output:200] Intake/Output this shift:    General appearance: alert, cooperative, no distress and sleeping well when i came in. GI: soft, still tender to palpation, +BS, + flatus  Lab Results:   [facility] 02/13/12 0428 02/25/2012 0415  WBC 4.8 5.1  HGB 11.7* 12.1  HCT 35.9* 35.8*  PLT 242 219    BMET  Basename 02/13/12 0428 Feb 25, 2012 0415  NA -- --  K 3.8 4.0  CL -- --  CO2 -- --  GLUCOSE -- --  BUN -- --  CREATININE 0.89 0.88  CALCIUM -- --   PT/INR No results found for this basename: LABPROT:2,INR:2 in the last 72 hours   Lab 02/09/12 0415 02/08/12 1730  AST 49* 44*  ALT 44* 40*  ALKPHOS 110 113  BILITOT 0.5 0.5  PROT 8.3 8.2  ALBUMIN 3.9 3.8     Lipase     Component Value Date/Time   LIPASE 33 02/08/2012 1730     Studies/Results: Dg Abd 2 Views  2012/02/25  *RADIOLOGY REPORT*  Clinical Data: Upper abdominal pain.  ABDOMEN - 2 VIEW  Comparison: 02/10/2012  Findings: Upright and supine views of the abdomen were obtained. There is no evidence for free air.  Nasogastric tube in the stomach region.  There is oral contrast in the colon.  There is a surgical mesh material in the lower abdomen.  There is gas within the small and large bowel.  Intramedullary nail in the right femur.  Cannot exclude left basilar lung densities.  IMPRESSION: Nonspecific bowel gas  pattern.  There is oral contrast and gas within the colon.  Probable left basilar atelectasis.   Original Report Authenticated By: Richarda Overlie, M.D.     Medications:    . bisacodyl  10 mg Rectal Daily  . chlorhexidine  15 mL Mouth/Throat QID  . insulin aspart  0-9 Units Subcutaneous Q4H  . lip balm  1 application Topical BID  . pantoprazole (PROTONIX) IV  40 mg Intravenous Q12H   Prior to Admission medications   Medication Sig Start Date End Date Taking? Authorizing Provider  atenolol (TENORMIN) 100 MG tablet Take 100 mg by mouth daily.   Yes Historical Provider, MD  HYDROcodone-acetaminophen (VICODIN) 5-500 MG per tablet Take 1 tablet by mouth at bedtime as needed. For pain   Yes Historical Provider, MD  insulin NPH (HUMULIN N,NOVOLIN N) 100 UNIT/ML injection Inject 5-15 Units into the skin. 15 in the am and 5 at night   Yes Historical Provider, MD  metFORMIN (GLUCOPHAGE) 500 MG tablet Take 500 mg by mouth 2 (two) times daily with a meal.   Yes Historical Provider, MD  potassium chloride (K-DUR) 10 MEQ tablet Take 10 mEq by mouth daily.   Yes Historical Provider, MD  rosuvastatin (CRESTOR) 40 MG tablet Take 40 mg by mouth daily.   Yes Historical Provider, MD  Assessment/Plan SBO, hx of incarcerated Ventral hernia repair 2009 SG with SBO in 2011, and 2012.  Hx of hysterectomy and 2 C sections  Chronic pain, since 2008  AODM  Hx of MI  Body mass index is 35.7  Plan:  Restart atenolol, hydrocodone,add lovenox, clears and see how she does, if OK I will discuss taking out NG.  LOS: 5 days    JENNINGS,WILLARD 02/13/2012  Pt seen and examined.  She should be able to have tube removed.  Advance diet as tolerated.

## 2012-02-13 NOTE — Progress Notes (Signed)
[facility] PROGRESS NOTE  ELINDA BUNTEN ZOX:[id number] DOB: [birth year] DOA: 02/08/2012 PCP: Emeterio Reeve, MD  Assessment/Plan: *SBO (small bowel obstruction)  Admitted and placed on bowel rest. NG tube placed for gastric decompression. Surgical consultation performed.  F/U abdominal films done 02/10/2012 showed a non-obstructive bowel gas pattern and films done 02/12/2012 showed contrast in the colon.  Mobilize as tolerated.  Minimize narcotics. Tolerating liquids 02/13/12-->possible advance diet 02/14/12 Active Problems:  Hypokalemia  Resolved.  AKI/dehydration  Baseline creatinine 0.8-1.0. Creatinine back to usual baseline values status post hydration. Obesity (BMI 30-39.9)  Clear liquids today. Hypertension  Atenolol currently on hold. Blood pressure stable. Hyperlipidemia  Statin currently on hold. Diabetes  Hemoglobin A1c 8%. Currently on every 4 hours sliding scale insulin. CBG is 137-159, good control. Code Status: Full code  Family Communication: None at bedside.  Disposition Plan: Home when stable.     Family Communication:   Pt at beside Disposition Plan:   Home when medically stable      Procedures/Studies: Dg Abd 1 View  02/09/2012  *RADIOLOGY REPORT*  Clinical Data: Abdominal pain.  Follow up small bowel obstruction.  ABDOMEN - 1 VIEW  Comparison: Acute abdomen series and CT abdomen and pelvis yesterday.  Findings: Persistent dilated, oral contrast filled loops of small bowel throughout the abdomen.  Gas and stool in normal caliber colon.  Surgical mesh material overlying the upper pelvis. Contrast material within the normal-appearing urinary bladder. Nasogastric tube tip in the distal body or antrum of the stomach.  IMPRESSION: No significant change since yesterday in the partial small bowel obstruction.  The dilated loops of small bowel are predominantly fluid-filled and contain a small amount of residual oral contrast.   Original Report Authenticated By:  Hulan Saas, M.D.    Ct Abdomen Pelvis W Contrast  02/08/2012  *RADIOLOGY REPORT*  Clinical Data: Abdominal pain, nausea, vomiting and diarrhea. History of small bowel obstruction.  The patient reports similar symptoms at the time of previous obstruction  CT ABDOMEN AND PELVIS WITH CONTRAST  Technique:  Multidetector CT imaging of the abdomen and pelvis was performed following the standard protocol during bolus administration of intravenous contrast.  Contrast: OMNIPAQUE IOHEXOL 300 MG/ML  SOLN  Comparison: Radiographs obtained earlier today.  CT dated 10/10/2011.  Findings: Diffuse low density of the liver relative to the spleen is again demonstrated.  Interval dilated, fluid-filled loops of small bowel in the pelvis and mid and lower abdomen, with fecalization of some of the small bowel contents.  The small bowel tapers to a more normal caliber just proximal to a small bowel to the small bowel anastomosis in the upper right pelvis.  There is focal small bowel dilatation and fecalization of contents at the location of the anastomosis.  The small bowel loop extending from the region of the anastomosis is minimally dilated and tapers to a normal caliber distally.  No colonic dilatation is seen.  There are scattered colonic diverticula without evidence of diverticulitis.  No evidence of appendicitis.  Small amount of free peritoneal fluid in the pelvis.  Small accessory splenule.  The spleen, pancreas, gallbladder, adrenal glands, kidneys and urinary bladder are unremarkable.  Surgically absent uterus.  No adnexal masses or enlarged lymph nodes.  Hernia repair mesh.  A 4 mm subpleural nodular density in the left lower lobe on image number 1 is unchanged since 04/03/2009, compatible with a benign process.  Minimal linear atelectasis or scarring is noted at the right lung base.  Bilateral L5  pars interarticularis defects with associated grade 1 anterolisthesis at the L5-S1 level.  There is also moderate to  marked disc space narrowing with a vacuum phenomenon, discogenic sclerosis and anterior spur formation at that level.  Milder degenerative changes elsewhere in the lumbar and lower thoracic spine.  IMPRESSION:  1.  Small bowel obstruction to the level of the right pelvis in the region of a small bowel to small bowel anastomosis.  This could be due to an adhesion or anastamotic scarring.  2.  Hepatic steatosis. 3.  Colonic diverticulosis. 4.  Stable bilateral L5 spondylolysis and associated grade 1 spondylolisthesis at the L5-S1 level.   Original Report Authenticated By: Beckie Salts, M.D.    Dg Abd 2 Views  02/12/2012  *RADIOLOGY REPORT*  Clinical Data: Upper abdominal pain.  ABDOMEN - 2 VIEW  Comparison: 02/10/2012  Findings: Upright and supine views of the abdomen were obtained. There is no evidence for free air.  Nasogastric tube in the stomach region.  There is oral contrast in the colon.  There is a surgical mesh material in the lower abdomen.  There is gas within the small and large bowel.  Intramedullary nail in the right femur.  Cannot exclude left basilar lung densities.  IMPRESSION: Nonspecific bowel gas pattern.  There is oral contrast and gas within the colon.  Probable left basilar atelectasis.   Original Report Authenticated By: Richarda Overlie, M.D.    Dg Abd 2 Views  02/10/2012  *RADIOLOGY REPORT*  Clinical Data: Abdominal pain  ABDOMEN - 2 VIEW  Comparison: 02/09/2012  Findings: Nasogastric tube appropriately positioned.  Residual contrast within nondilated colon is noted.  Abdominal mesh is present.  Left lower lobe patchy airspace opacity is new since prior.  No dilated loop of bowel.  No free air beneath the diaphragms.  No air fluid level.  IMPRESSION: Nonobstructive bowel gas pattern.  Patchy left lower lobe airspace opacity, possibly atelectasis although early pneumonia could have a similar appearance.   Original Report Authenticated By: Christiana Pellant, M.D.    Dg Abd Acute W/chest  02/08/2012   *RADIOLOGY REPORT*  Clinical Data: Diffuse abdominal pain, nausea and diarrhea.  ACUTE ABDOMEN SERIES (ABDOMEN 2 VIEW & CHEST 1 VIEW)  Comparison: Previous examinations.  Findings: Normal sized heart.  Diffusely prominent interstitial markings.  Small nodular density in the lateral aspect of the right upper lung zone without significant change since 12/13/2011.  This corresponds to a focal calcified pleural plaque or rib exostosis on the CT dated 01/11/2012. Normal bowel gas pattern without free peritoneal air.  Hernia fixation markers overlying the upper pelvis and lower abdomen.  Changes of osteitis pubis.  IMPRESSION:  1.  No acute abnormality. 2.  Chronic interstitial lung disease.   Original Report Authenticated By: Beckie Salts, M.D.          Subjective: Patient is doing well. She had a bowel movement today. Tolerating clear liquid diet. Denies fevers, chills, chest pain, shortness breath, nausea, vomiting, diarrhea, abdominal pain. No dysuria. No rashes.  Objective: Filed Vitals:   02/12/12 1400 02/12/12 2121 02/13/12 0509 02/13/12 1400  BP: 181/92 170/90 148/78 172/88  Pulse: 72 78 80 60  Temp: 99.1 F (37.3 C) 99 F (37.2 C) 98.7 F (37.1 C) 98.6 F (37 C)  TempSrc: Oral Oral Oral Oral  Resp: 18 20    Height:      Weight:      SpO2: 95% 94% 96% 97%    Intake/Output Summary (Last 24 hours) at  02/13/12 1709 Last data filed at 02/13/12 1624  Gross per 24 hour  Intake   3300 ml  Output   2150 ml  Net   1150 ml   Weight change:  Exam:   General:  Pt is alert, follows commands appropriately, not in acute distress  HEENT: No icterus, No thrush,[place]/AT  Cardiovascular: RRR, S1/S2, no rubs, no gallops  Respiratory: CTA bilaterally, no wheezing, no crackles, no rhonchi  Abdomen: Soft/+BS, non tender, non distended, no guarding  Extremities: No edema, No lymphangitis, No petechiae, No rashes, no synovitis  Data Reviewed: Basic Metabolic Panel:  Lab 02/13/12 9147  02/12/12 0415 02/11/12 0434 02/10/12 0435 02/09/12 0415 02/08/12 1730  NA -- -- -- 137 134* 137  K 3.8 4.0 3.9 3.3* 4.5 --  CL -- -- -- 103 99 101  CO2 -- -- -- 24 22 22   GLUCOSE -- -- -- 135* 208* 228*  BUN -- -- -- 14 22 27*  CREATININE 0.89 0.88 0.93 0.92 1.11* --  CALCIUM -- -- -- 8.9 9.4 9.4  MG -- -- -- -- 2.1 --  PHOS -- -- -- -- 4.2 --   Liver Function Tests:  Lab 02/09/12 0415 02/08/12 1730  AST 49* 44*  ALT 44* 40*  ALKPHOS 110 113  BILITOT 0.5 0.5  PROT 8.3 8.2  ALBUMIN 3.9 3.8    Lab 02/08/12 1730  LIPASE 33  AMYLASE --   No results found for this basename: AMMONIA:5 in the last 168 hours CBC:  Lab 02/13/12 0428 02/12/12 0415 02/11/12 0434 02/09/12 0415 02/08/12 1730  WBC 4.8 5.1 5.8 9.3 6.2  NEUTROABS -- -- -- -- 3.5  HGB 11.7* 12.1 11.7* 12.8 13.5  HCT 35.9* 35.8* 35.3* 39.0 39.5  MCV 83.1 81.7 82.3 82.6 80.9  PLT 242 219 211 270 270   Cardiac Enzymes: No results found for this basename: CKTOTAL:5,CKMB:5,CKMBINDEX:5,TROPONINI:5 in the last 168 hours BNP: No components found with this basename: POCBNP:5 CBG:  Lab 02/13/12 1639 02/13/12 1212 02/13/12 0730 02/13/12 0401 02/12/12 2344  GLUCAP 147* 167* 151* 161* 157*    No results found for this or any previous visit (from the past 240 hour(s)).   Scheduled Meds:   . atenolol  50 mg Oral BID  . bisacodyl  10 mg Rectal Daily  . chlorhexidine  15 mL Mouth/Throat QID  . enoxaparin (LOVENOX) injection  40 mg Subcutaneous Q24H  . insulin aspart  0-9 Units Subcutaneous Q4H  . lip balm  1 application Topical BID  . pantoprazole (PROTONIX) IV  40 mg Intravenous Q12H   Continuous Infusions:   . dextrose 5 % and 0.9 % NaCl with KCl 40 mEq/L 20 mL/hr (02/13/12 1624)     Florian Chauca, DO  [facility] Pager [phone]  If 7PM-7AM, please contact night-coverage [email or web address] Password [email or web address] 02/13/2012, 5:09 PM   LOS: 5 days

## 2012-02-14 DIAGNOSIS — E876 Hypokalemia: Secondary | ICD-10-CM

## 2012-02-14 LAB — BASIC METABOLIC PANEL
CO2: 20 mEq/L (ref 19–32)
Calcium: 9.8 mg/dL (ref 8.4–10.5)
Chloride: 102 mEq/L (ref 96–112)
Creatinine, Ser: 0.8 mg/dL (ref 0.50–1.10)
Glucose, Bld: 146 mg/dL — ABNORMAL HIGH (ref 70–99)

## 2012-02-14 LAB — CBC
Hemoglobin: 13.1 g/dL (ref 12.0–15.0)
MCH: 27.7 pg (ref 26.0–34.0)
MCV: 81.6 fL (ref 78.0–100.0)
Platelets: 268 10*3/uL (ref 150–400)
RBC: 4.73 MIL/uL (ref 3.87–5.11)
WBC: 4.5 10*3/uL (ref 4.0–10.5)

## 2012-02-14 LAB — GLUCOSE, CAPILLARY
Glucose-Capillary: 205 mg/dL — ABNORMAL HIGH (ref 70–99)
Glucose-Capillary: 159 mg/dL — ABNORMAL HIGH (ref 70–99)
Glucose-Capillary: 158 mg/dL — ABNORMAL HIGH (ref 70–99)
Glucose-Capillary: 199 mg/dL — ABNORMAL HIGH (ref 70–99)

## 2012-02-14 MED ORDER — INSULIN ASPART 100 UNIT/ML ~~LOC~~ SOLN: 0.0000 [IU] | Freq: Every day | SUBCUTANEOUS | Status: DC

## 2012-02-14 MED ORDER — AMLODIPINE BESYLATE 5 MG PO TABS
5.0000 mg | ORAL_TABLET | Freq: Every day | ORAL | Status: DC
  Administered 2012-02-14: 5 mg via ORAL
  Filled 2012-02-14 (×2): qty 1

## 2012-02-14 MED ORDER — PANTOPRAZOLE SODIUM 40 MG PO TBEC
40.0000 mg | DELAYED_RELEASE_TABLET | Freq: Two times a day (BID) | ORAL | Status: DC
  Administered 2012-02-14 – 2012-02-15 (×3): 40 mg via ORAL
  Filled 2012-02-14 (×4): qty 1

## 2012-02-14 MED ORDER — ATENOLOL 50 MG PO TABS: 50.0000 mg | ORAL_TABLET | Freq: Every day | ORAL | Status: DC

## 2012-02-14 MED ORDER — INSULIN ASPART 100 UNIT/ML ~~LOC~~ SOLN
0.0000 [IU] | Freq: Three times a day (TID) | SUBCUTANEOUS | Status: DC
  Administered 2012-02-15: 8 [IU] via SUBCUTANEOUS
  Administered 2012-02-15: 3 [IU] via SUBCUTANEOUS

## 2012-02-14 NOTE — Progress Notes (Signed)
Patient states she has had BM, but c/o cramping pain after eating state will not do dinner. Dr. Arbutus Leas on floor informed of situation. Patient refusing pain meds at this time

## 2012-02-14 NOTE — Progress Notes (Signed)
  Subjective: Feeling much better did not complain of pain with exam.  She been having some loose stools, tolerated full liquids last PM.    Objective: Vital signs in last 24 hours: Temp:  [98.1 F (36.7 C)-98.6 F (37 C)] 98.1 F (36.7 C) (01/09 0541) Pulse Rate:  [60-66] 66  (01/09 0541) Resp:  [18] 18  (01/09 0541) BP: (156-175)/(67-88) 156/81 mmHg (01/09 0541) SpO2:  [96 %-97 %] 97 % (01/09 0541) Last BM Date: 02/11/12  PO 480 recorded, Diet: we tried her on Full liquids last PM Afebrile, VSS, labs OK,  Intake/Output from previous day: 01/08 0701 - 01/09 0700 In: 1998.7 [P.O.:480; I.V.:1518.7] Out: 2850 [Urine:2850] Intake/Output this shift:    General appearance: alert, cooperative and no distress GI: soft, non-tender; bowel sounds normal; no masses,  no organomegaly +BM and flatus  Lab Results:   [facility] 02/14/12 0414 02/13/12 0428  WBC 4.5 4.8  HGB 13.1 11.7*  HCT 38.6 35.9*  PLT 268 242    BMET  Basename 02/14/12 0500 02/13/12 0428  NA 135 --  K 3.5 3.8  CL 102 --  CO2 20 --  GLUCOSE 146* --  BUN 3* --  CREATININE 0.80 0.89  CALCIUM 9.8 --   PT/INR No results found for this basename: LABPROT:2,INR:2 in the last 72 hours   Lab 02/09/12 0415 02/08/12 1730  AST 49* 44*  ALT 44* 40*  ALKPHOS 110 113  BILITOT 0.5 0.5  PROT 8.3 8.2  ALBUMIN 3.9 3.8     Lipase     Component Value Date/Time   LIPASE 33 02/08/2012 1730     Studies/Results: Dg Abd 2 Views  02-15-2012  *RADIOLOGY REPORT*  Clinical Data: Upper abdominal pain.  ABDOMEN - 2 VIEW  Comparison: 02/10/2012  Findings: Upright and supine views of the abdomen were obtained. There is no evidence for free air.  Nasogastric tube in the stomach region.  There is oral contrast in the colon.  There is a surgical mesh material in the lower abdomen.  There is gas within the small and large bowel.  Intramedullary nail in the right femur.  Cannot exclude left basilar lung densities.  IMPRESSION:  Nonspecific bowel gas pattern.  There is oral contrast and gas within the colon.  Probable left basilar atelectasis.   Original Report Authenticated By: Richarda Overlie, M.D.     Medications:    . atenolol  50 mg Oral BID  . bisacodyl  10 mg Rectal Daily  . chlorhexidine  15 mL Mouth/Throat QID  . enoxaparin (LOVENOX) injection  40 mg Subcutaneous Q24H  . insulin aspart  0-9 Units Subcutaneous Q4H  . lip balm  1 application Topical BID  . pantoprazole (PROTONIX) IV  40 mg Intravenous Q12H    Assessment/Plan SBO, hx of incarcerated Ventral hernia repair 2009 SG with SBO in 2011, and 2012.  Hx of hysterectomy and 2 C sections  Chronic pain, since 2008  AODM  Hx of MI  Body mass index is 35.7    Plan:  Full liquids at lunch, continue to mobilize, low fiber diet at lunch and then home when ready.  LOS: 6 days   shes up walking and talking about going home so I'm going to give her a regular breakfast now.   JENNINGS,WILLARD 02/14/2012  Pt seen and examined.  She looks and feels well.  Diet and activity as tolerated.

## 2012-02-14 NOTE — Progress Notes (Signed)
This patient is receiving IV Protonix. Based on criteria approved by the Pharmacy and Therapeutics Committee, this medication is being converted to the equivalent oral dose form. These criteria include:   . The patient is eating (either orally or per tube) and/or has been taking other orally administered medications for at least 24 hours.  . This patient has no evidence of active gastrointestinal bleeding or impaired GI absorption (gastrectomy, short bowel, patient on TNA or NPO).   If you have questions about this conversion, please contact the pharmacy department.  [name], [name], [facility] 02/14/2012 10:49 AM

## 2012-02-14 NOTE — Progress Notes (Addendum)
[facility] PROGRESS NOTE  [name] GNF:[id number] DOB: 08/29/1955 DOA: 02/08/2012 PCP: Emeterio Reeve, MD  Assessment/Plan: *SBO (small bowel obstruction)  Admitted and placed on bowel rest. NG tube placed for gastric decompression. Surgical consultation performed.  F/U abdominal films done 02/10/2012 showed a non-obstructive bowel gas pattern and films done 02/12/2012 showed contrast in the colon.  Tolerating liquids 02/13/12-->low fiber diet 02/14/12 Had abdominal pain and cramping with meals today No vomiting--hold d/c until 02/15/12 Active Problems:  Hypokalemia  Resolved.  AKI/dehydration  Baseline creatinine 0.8-1.0. Creatinine back to usual baseline values status post hydration. Obesity (BMI 30-39.9)  Clear liquids today. Hypertension  Restarted atenolol Add amlodipine Hyperlipidemia  Statin currently on hold. Diabetes  Hemoglobin A1c 8%. Change CBG to q ac/hs Code Status: Full code  Family Communication: None at bedside.  Disposition Plan: Home when stable.     Procedures/Studies: Dg Abd 1 View  02/09/2012  *RADIOLOGY REPORT*  Clinical Data: Abdominal pain.  Follow up small bowel obstruction.  ABDOMEN - 1 VIEW  Comparison: Acute abdomen series and CT abdomen and pelvis yesterday.  Findings: Persistent dilated, oral contrast filled loops of small bowel throughout the abdomen.  Gas and stool in normal caliber colon.  Surgical mesh material overlying the upper pelvis. Contrast material within the normal-appearing urinary bladder. Nasogastric tube tip in the distal body or antrum of the stomach.  IMPRESSION: No significant change since yesterday in the partial small bowel obstruction.  The dilated loops of small bowel are predominantly fluid-filled and contain a small amount of residual oral contrast.   Original Report Authenticated By: Hulan Saas, M.D.    Ct Abdomen Pelvis W Contrast  02/08/2012  *RADIOLOGY REPORT*  Clinical Data: Abdominal pain, nausea, vomiting and  diarrhea. History of small bowel obstruction.  The patient reports similar symptoms at the time of previous obstruction  CT ABDOMEN AND PELVIS WITH CONTRAST  Technique:  Multidetector CT imaging of the abdomen and pelvis was performed following the standard protocol during bolus administration of intravenous contrast.  Contrast: OMNIPAQUE IOHEXOL 300 MG/ML  SOLN  Comparison: Radiographs obtained earlier today.  CT dated 10/10/2011.  Findings: Diffuse low density of the liver relative to the spleen is again demonstrated.  Interval dilated, fluid-filled loops of small bowel in the pelvis and mid and lower abdomen, with fecalization of some of the small bowel contents.  The small bowel tapers to a more normal caliber just proximal to a small bowel to the small bowel anastomosis in the upper right pelvis.  There is focal small bowel dilatation and fecalization of contents at the location of the anastomosis.  The small bowel loop extending from the region of the anastomosis is minimally dilated and tapers to a normal caliber distally.  No colonic dilatation is seen.  There are scattered colonic diverticula without evidence of diverticulitis.  No evidence of appendicitis.  Small amount of free peritoneal fluid in the pelvis.  Small accessory splenule.  The spleen, pancreas, gallbladder, adrenal glands, kidneys and urinary bladder are unremarkable.  Surgically absent uterus.  No adnexal masses or enlarged lymph nodes.  Hernia repair mesh.  A 4 mm subpleural nodular density in the left lower lobe on image number 1 is unchanged since 04/03/2009, compatible with a benign process.  Minimal linear atelectasis or scarring is noted at the right lung base.  Bilateral L5 pars interarticularis defects with associated grade 1 anterolisthesis at the L5-S1 level.  There is also moderate to marked disc space narrowing with a  vacuum phenomenon, discogenic sclerosis and anterior spur formation at that level.  Milder degenerative  changes elsewhere in the lumbar and lower thoracic spine.  IMPRESSION:  1.  Small bowel obstruction to the level of the right pelvis in the region of a small bowel to small bowel anastomosis.  This could be due to an adhesion or anastamotic scarring.  2.  Hepatic steatosis. 3.  Colonic diverticulosis. 4.  Stable bilateral L5 spondylolysis and associated grade 1 spondylolisthesis at the L5-S1 level.   Original Report Authenticated By: Beckie Salts, M.D.    Dg Abd 2 Views  02/12/2012  *RADIOLOGY REPORT*  Clinical Data: Upper abdominal pain.  ABDOMEN - 2 VIEW  Comparison: 02/10/2012  Findings: Upright and supine views of the abdomen were obtained. There is no evidence for free air.  Nasogastric tube in the stomach region.  There is oral contrast in the colon.  There is a surgical mesh material in the lower abdomen.  There is gas within the small and large bowel.  Intramedullary nail in the right femur.  Cannot exclude left basilar lung densities.  IMPRESSION: Nonspecific bowel gas pattern.  There is oral contrast and gas within the colon.  Probable left basilar atelectasis.   Original Report Authenticated By: Richarda Overlie, M.D.    Dg Abd 2 Views  02/10/2012  *RADIOLOGY REPORT*  Clinical Data: Abdominal pain  ABDOMEN - 2 VIEW  Comparison: 02/09/2012  Findings: Nasogastric tube appropriately positioned.  Residual contrast within nondilated colon is noted.  Abdominal mesh is present.  Left lower lobe patchy airspace opacity is new since prior.  No dilated loop of bowel.  No free air beneath the diaphragms.  No air fluid level.  IMPRESSION: Nonobstructive bowel gas pattern.  Patchy left lower lobe airspace opacity, possibly atelectasis although early pneumonia could have a similar appearance.   Original Report Authenticated By: Christiana Pellant, M.D.    Dg Abd Acute W/chest  02/08/2012  *RADIOLOGY REPORT*  Clinical Data: Diffuse abdominal pain, nausea and diarrhea.  ACUTE ABDOMEN SERIES (ABDOMEN 2 VIEW & CHEST 1 VIEW)   Comparison: Previous examinations.  Findings: Normal sized heart.  Diffusely prominent interstitial markings.  Small nodular density in the lateral aspect of the right upper lung zone without significant change since 12/13/2011.  This corresponds to a focal calcified pleural plaque or rib exostosis on the CT dated 01/11/2012. Normal bowel gas pattern without free peritoneal air.  Hernia fixation markers overlying the upper pelvis and lower abdomen.  Changes of osteitis pubis.  IMPRESSION:  1.  No acute abnormality. 2.  Chronic interstitial lung disease.   Original Report Authenticated By: Beckie Salts, M.D.          Subjective: Patient is tolerating a low fiber diet, but she had abdominal pain and cramping after meals. That, patient states that she is commended for dinner for tonight and only drink liquids. She denies any fevers, chills, vomiting, chest pain, shortness of breath. She did have 3 bowel movements today. No dysuria or hematuria.  Objective: Filed Vitals:   02/13/12 2142 02/14/12 0541 02/14/12 1038 02/14/12 1400  BP: 175/67 156/81 174/97 169/88  Pulse: 64 66 65 68  Temp: 98.6 F (37 C) 98.1 F (36.7 C)  98.7 F (37.1 C)  TempSrc: Oral Oral  Oral  Resp: 18 18  18   Height:      Weight:      SpO2: 96% 97%  96%    Intake/Output Summary (Last 24 hours) at 02/14/12 1648 Last data  filed at 02/14/12 1400  Gross per 24 hour  Intake 1100.33 ml  Output   3650 ml  Net -2549.67 ml   Weight change:  Exam:   General:  Pt is alert, follows commands appropriately, not in acute distress  HEENT: No icterus, No thrush, , [place]/AT  Cardiovascular: RRR, S1/S2, no rubs, no gallops  Respiratory: CTA bilaterally, no wheezing, no crackles, no rhonchi  Abdomen: Soft/+BS,non distended, no guarding; mild tenderness diffusely without any rebound tenderness or peritoneal signs.  Extremities: No edema, No lymphangitis, No petechiae, No rashes, no synovitis  Data Reviewed: Basic Metabolic  Panel:  Lab 02/14/12 0500 02/13/12 0428 02/12/12 0415 02/11/12 0434 02/10/12 0435 02/09/12 0415 02/08/12 1730  NA 135 -- -- -- 137 134* 137  K 3.5 3.8 4.0 3.9 3.3* -- --  CL 102 -- -- -- 103 99 101  CO2 20 -- -- -- 24 22 22   GLUCOSE 146* -- -- -- 135* 208* 228*  BUN 3* -- -- -- 14 22 27*  CREATININE 0.80 0.89 0.88 0.93 0.92 -- --  CALCIUM 9.8 -- -- -- 8.9 9.4 9.4  MG -- -- -- -- -- 2.1 --  PHOS -- -- -- -- -- 4.2 --   Liver Function Tests:  Lab 02/09/12 0415 02/08/12 1730  AST 49* 44*  ALT 44* 40*  ALKPHOS 110 113  BILITOT 0.5 0.5  PROT 8.3 8.2  ALBUMIN 3.9 3.8    Lab 02/08/12 1730  LIPASE 33  AMYLASE --   No results found for this basename: AMMONIA:5 in the last 168 hours CBC:  Lab 02/14/12 0414 02/13/12 0428 02/12/12 0415 02/11/12 0434 02/09/12 0415 02/08/12 1730  WBC 4.5 4.8 5.1 5.8 9.3 --  NEUTROABS -- -- -- -- -- 3.5  HGB 13.1 11.7* 12.1 11.7* 12.8 --  HCT 38.6 35.9* 35.8* 35.3* 39.0 --  MCV 81.6 83.1 81.7 82.3 82.6 --  PLT 268 242 219 211 270 --   Cardiac Enzymes: No results found for this basename: CKTOTAL:5,CKMB:5,CKMBINDEX:5,TROPONINI:5 in the last 168 hours BNP: No components found with this basename: POCBNP:5 CBG:  Lab 02/14/12 1318 02/14/12 0824 02/14/12 0328 02/13/12 2348 02/13/12 1921  GLUCAP 159* 205* 158* 141* 148*    No results found for this or any previous visit (from the past 240 hour(s)).   Scheduled Meds:   . atenolol  50 mg Oral BID  . bisacodyl  10 mg Rectal Daily  . chlorhexidine  15 mL Mouth/Throat QID  . enoxaparin (LOVENOX) injection  40 mg Subcutaneous Q24H  . insulin aspart  0-9 Units Subcutaneous Q4H  . lip balm  1 application Topical BID  . pantoprazole  40 mg Oral BID AC   Continuous Infusions:    Kofi Murrell, DO  [facility] Pager [phone]  If 7PM-7AM, please contact night-coverage [email or web address] Password [email or web address] 02/14/2012, 4:48 PM   LOS: 6 days

## 2012-02-14 NOTE — Progress Notes (Signed)
Spoke to Dr. Arbutus Leas on floor informed him patient c/o some pain after breakfast ate atleast 50% of meal, MD states will monitor pateint after lunch

## 2012-02-15 LAB — BASIC METABOLIC PANEL
CO2: 20 mEq/L (ref 19–32)
Calcium: 9.5 mg/dL (ref 8.4–10.5)
GFR calc non Af Amer: 64 mL/min — ABNORMAL LOW (ref >90)
Glucose, Bld: 185 mg/dL — ABNORMAL HIGH (ref 70–99)
Potassium: 3.2 mEq/L — ABNORMAL LOW (ref 3.5–5.1)
Sodium: 133 mEq/L — ABNORMAL LOW (ref 135–145)

## 2012-02-15 LAB — CBC
Hemoglobin: 12.6 g/dL (ref 12.0–15.0)
MCH: 27.2 pg (ref 26.0–34.0)
MCHC: 33.4 g/dL (ref 30.0–36.0)
Platelets: 262 10*3/uL (ref 150–400)
RBC: 4.63 MIL/uL (ref 3.87–5.11)

## 2012-02-15 LAB — MAGNESIUM: Magnesium: 1.8 mg/dL (ref 1.5–2.5)

## 2012-02-15 MED ORDER — UNABLE TO FIND: Status: DC

## 2012-02-15 MED ORDER — POTASSIUM CHLORIDE 20 MEQ/15ML (10%) PO LIQD
40.0000 meq | Freq: Once | ORAL | Status: AC
  Administered 2012-02-15: 40 meq via ORAL
  Filled 2012-02-15: qty 30

## 2012-02-15 MED ORDER — AMLODIPINE BESYLATE 5 MG PO TABS: 5.0000 mg | ORAL_TABLET | Freq: Every day | ORAL | Status: DC

## 2012-02-15 NOTE — Discharge Summary (Signed)
Physician Discharge Summary  [name] ZOX:[id number] DOB: 1955/05/19 DOA: 02/08/2012  PCP: Emeterio Reeve, MD  Admit date: 02/08/2012 Discharge date: 02/15/2012  Recommendations for Outpatient Follow-up:  1. Pt will need to follow up with PCP in 2 weeks post discharge 2. Please obtain BMP to evaluate electrolytes and kidney function 3. Please also check CBC to evaluate Hg and Hct levels  Discharge Diagnoses:  Principal Problem:  *SBO (small bowel obstruction) Active Problems:  Diabetes  Obesity (BMI 30-39.9)  AKI (acute kidney injury)  Dehydration  Hypokalemia SBO (small bowel obstruction)  Admitted and placed on bowel rest. NG tube placed for gastric decompression. Surgical consultation performed.   Pt improved with conservative therapy during admission.  No surgery performed Pt received enema to help with large vol of stool-->had BM 1/6, maintained on dulculax NG clamp trials 02/12/12, mobilized pt 02/13/12--NG removed, restarted some of oral meds, advancing diet F/U abdominal films done 02/10/2012 showed a non-obstructive bowel gas pattern and films done 02/12/2012 showed contrast in the colon.  Tolerating liquids 02/13/12-->low fiber diet 02/14/12  Had abdominal pain and cramping with meals today 02/14/12 No vomiting--hold d/c until 02/15/12 02/15/12 tolerating breakfast and lunch without pain, n/v Active Problems:  Hypokalemia  Repleted AKI/dehydration  Baseline creatinine 0.8-1.0. Creatinine back to usual baseline values status post hydration--0.96 on day of d/c (1.13 on day admission) Obesity (BMI 30-39.9)   Hypertension  Restarted atenolol  Add amlodipine due to suboptimal control. Hyperlipidemia  Statin currently on hold. Restart crestor at time of discharge Diabetes   Insulin home regimen held during admission due to n/v Hemoglobin A1c 8%.  Changed CBG to q ac/hs when tolerated diet Restart home insulin regimen at time of d/c Code Status: Full code  Family Communication:  None at bedside.     Discharge Condition: stable  Disposition: d/c home  Diet: low fiber diet x 2 weeks, then back to 1800 cal ADA Wt Readings from Last 3 Encounters:  02/09/12 100.4 kg (221 lb 5.5 oz)  12/16/11 91.4 kg (201 lb 8 oz)    History of present illness:  57 y.o. female  has a past medical history of Diabetes mellitus; Hypertension; Myocardial infarction (2000); and Chronic pain following surgery or procedure (2008).   Since this am 02/08/12, she developed severe abdominal pain and distention with nausea but no vomiting. Last BM was this AM 02/08/12. Her last episode of SBO was about 2 years ago but she have had them very frequently. She had to be operated once by Dr. Michaell Cowing 5 years ago but otherwise she has recovered with conservative management.  At her baseline she has a lot of foot pain and so she does use some Vicodin frequently.  NG placed and admitted to medicine service.  IV fluids provided.  Serum creatinine was 1.13 at time of admission and lactic acid 2.6.  CT abdomen at time of admission showed SBO to level of right pelvis in region of her sm. Bowel anastomosis, diverticulosis, fatty liver, and stable spondylosis L5.  Pt made NPO with NG decompression.  Surgery consulted and IVF started.     Discharge Exam: Filed Vitals:   02/15/12 0550  BP: 147/82  Pulse: 60  Temp: 97.7 F (36.5 C)  Resp: 18   Filed Vitals:   02/14/12 1400 02/14/12 2207 02/15/12 0547 02/15/12 0550  BP: 169/88 150/84 127/79 147/82  Pulse: 68 60 67 60  Temp: 98.7 F (37.1 C) 97.9 F (36.6 C) 98.7 F (37.1 C) 97.7 F (36.5  C)  TempSrc: Oral Oral Oral Oral  Resp: 18 18 18 18   Height:      Weight:      SpO2: 96% 99% 96% 96%   General: A&O x 3, NAD, pleasant, cooperative Cardiovascular: RRR, no rub, no gallop, no S3 Respiratory: CTAB, no wheeze, no rhonchi Abdomen:soft, nontender, nondistended, positive bowel sounds Extremities: No edema, No lymphangitis, no petechiae  Discharge  Instructions      Discharge Orders    Future Orders Please Complete By Expires   Diet - low sodium heart healthy      Increase activity slowly      Discharge instructions      Comments:   Follow up with primary care/family physician in 1-2 weeks.  Pick up amlodipine from pharmacy and start taking in addition to atenolol.  Low fiber diet x 1-2 week, then can return to diabetic diet if no abdominal pain, vomiting.       Medication List     As of 02/15/2012 12:53 PM    TAKE these medications         amLODipine 5 MG tablet   Commonly known as: NORVASC   Take 1 tablet (5 mg total) by mouth at bedtime.      atenolol 100 MG tablet   Commonly known as: TENORMIN   Take 100 mg by mouth daily.      HYDROcodone-acetaminophen 5-500 MG per tablet   Commonly known as: VICODIN   Take 1 tablet by mouth at bedtime as needed. For pain      insulin NPH 100 UNIT/ML injection   Commonly known as: HUMULIN N,NOVOLIN N   Inject 5-15 Units into the skin. 15 in the am and 5 at night      metFORMIN 500 MG tablet   Commonly known as: GLUCOPHAGE   Take 500 mg by mouth 2 (two) times daily with a meal.      potassium chloride 10 MEQ tablet   Commonly known as: K-DUR   Take 10 mEq by mouth daily.      rosuvastatin 40 MG tablet   Commonly known as: CRESTOR   Take 40 mg by mouth daily.          The results of significant diagnostics from this hospitalization (including imaging, microbiology, ancillary and laboratory) are listed below for reference.    Significant Diagnostic Studies: Dg Abd 1 View  02/09/2012  *RADIOLOGY REPORT*  Clinical Data: Abdominal pain.  Follow up small bowel obstruction.  ABDOMEN - 1 VIEW  Comparison: Acute abdomen series and CT abdomen and pelvis yesterday.  Findings: Persistent dilated, oral contrast filled loops of small bowel throughout the abdomen.  Gas and stool in normal caliber colon.  Surgical mesh material overlying the upper pelvis. Contrast material within the  normal-appearing urinary bladder. Nasogastric tube tip in the distal body or antrum of the stomach.  IMPRESSION: No significant change since yesterday in the partial small bowel obstruction.  The dilated loops of small bowel are predominantly fluid-filled and contain a small amount of residual oral contrast.   Original Report Authenticated By: Hulan Saas, M.D.    Ct Abdomen Pelvis W Contrast  02/08/2012  *RADIOLOGY REPORT*  Clinical Data: Abdominal pain, nausea, vomiting and diarrhea. History of small bowel obstruction.  The patient reports similar symptoms at the time of previous obstruction  CT ABDOMEN AND PELVIS WITH CONTRAST  Technique:  Multidetector CT imaging of the abdomen and pelvis was performed following the standard protocol during bolus administration  of intravenous contrast.  Contrast: OMNIPAQUE IOHEXOL 300 MG/ML  SOLN  Comparison: Radiographs obtained earlier today.  CT dated 10/10/2011.  Findings: Diffuse low density of the liver relative to the spleen is again demonstrated.  Interval dilated, fluid-filled loops of small bowel in the pelvis and mid and lower abdomen, with fecalization of some of the small bowel contents.  The small bowel tapers to a more normal caliber just proximal to a small bowel to the small bowel anastomosis in the upper right pelvis.  There is focal small bowel dilatation and fecalization of contents at the location of the anastomosis.  The small bowel loop extending from the region of the anastomosis is minimally dilated and tapers to a normal caliber distally.  No colonic dilatation is seen.  There are scattered colonic diverticula without evidence of diverticulitis.  No evidence of appendicitis.  Small amount of free peritoneal fluid in the pelvis.  Small accessory splenule.  The spleen, pancreas, gallbladder, adrenal glands, kidneys and urinary bladder are unremarkable.  Surgically absent uterus.  No adnexal masses or enlarged lymph nodes.  Hernia repair mesh.   A 4 mm subpleural nodular density in the left lower lobe on image number 1 is unchanged since 04/03/2009, compatible with a benign process.  Minimal linear atelectasis or scarring is noted at the right lung base.  Bilateral L5 pars interarticularis defects with associated grade 1 anterolisthesis at the L5-S1 level.  There is also moderate to marked disc space narrowing with a vacuum phenomenon, discogenic sclerosis and anterior spur formation at that level.  Milder degenerative changes elsewhere in the lumbar and lower thoracic spine.  IMPRESSION:  1.  Small bowel obstruction to the level of the right pelvis in the region of a small bowel to small bowel anastomosis.  This could be due to an adhesion or anastamotic scarring.  2.  Hepatic steatosis. 3.  Colonic diverticulosis. 4.  Stable bilateral L5 spondylolysis and associated grade 1 spondylolisthesis at the L5-S1 level.   Original Report Authenticated By: Beckie Salts, M.D.    Dg Abd 2 Views  02/12/2012  *RADIOLOGY REPORT*  Clinical Data: Upper abdominal pain.  ABDOMEN - 2 VIEW  Comparison: 02/10/2012  Findings: Upright and supine views of the abdomen were obtained. There is no evidence for free air.  Nasogastric tube in the stomach region.  There is oral contrast in the colon.  There is a surgical mesh material in the lower abdomen.  There is gas within the small and large bowel.  Intramedullary nail in the right femur.  Cannot exclude left basilar lung densities.  IMPRESSION: Nonspecific bowel gas pattern.  There is oral contrast and gas within the colon.  Probable left basilar atelectasis.   Original Report Authenticated By: Richarda Overlie, M.D.    Dg Abd 2 Views  02/10/2012  *RADIOLOGY REPORT*  Clinical Data: Abdominal pain  ABDOMEN - 2 VIEW  Comparison: 02/09/2012  Findings: Nasogastric tube appropriately positioned.  Residual contrast within nondilated colon is noted.  Abdominal mesh is present.  Left lower lobe patchy airspace opacity is new since prior.  No  dilated loop of bowel.  No free air beneath the diaphragms.  No air fluid level.  IMPRESSION: Nonobstructive bowel gas pattern.  Patchy left lower lobe airspace opacity, possibly atelectasis although early pneumonia could have a similar appearance.   Original Report Authenticated By: Christiana Pellant, M.D.    Dg Abd Acute W/chest  02/08/2012  *RADIOLOGY REPORT*  Clinical Data: Diffuse abdominal pain, nausea and  diarrhea.  ACUTE ABDOMEN SERIES (ABDOMEN 2 VIEW & CHEST 1 VIEW)  Comparison: Previous examinations.  Findings: Normal sized heart.  Diffusely prominent interstitial markings.  Small nodular density in the lateral aspect of the right upper lung zone without significant change since 12/13/2011.  This corresponds to a focal calcified pleural plaque or rib exostosis on the CT dated 01/11/2012. Normal bowel gas pattern without free peritoneal air.  Hernia fixation markers overlying the upper pelvis and lower abdomen.  Changes of osteitis pubis.  IMPRESSION:  1.  No acute abnormality. 2.  Chronic interstitial lung disease.   Original Report Authenticated By: Beckie Salts, M.D.      Microbiology: No results found for this or any previous visit (from the past 240 hour(s)).   Labs: Basic Metabolic Panel:  Lab 02/15/12 1610 02/14/12 0500 02/13/12 0428 02/12/12 0415 02/11/12 0434 02/10/12 0435 02/09/12 0415 02/08/12 1730  NA 133* 135 -- -- -- 137 134* 137  K 3.2* 3.5 -- -- -- -- -- --  CL 100 102 -- -- -- 103 99 101  CO2 20 20 -- -- -- 24 22 22   GLUCOSE 185* 146* -- -- -- 135* 208* 228*  BUN 8 3* -- -- -- 14 22 27*  CREATININE 0.96 0.80 0.89 0.88 0.93 -- -- --  CALCIUM 9.5 9.8 -- -- -- 8.9 9.4 9.4  MG 1.8 -- -- -- -- -- 2.1 --  PHOS -- -- -- -- -- -- 4.2 --   Liver Function Tests:  Lab 02/09/12 0415 02/08/12 1730  AST 49* 44*  ALT 44* 40*  ALKPHOS 110 113  BILITOT 0.5 0.5  PROT 8.3 8.2  ALBUMIN 3.9 3.8    Lab 02/08/12 1730  LIPASE 33  AMYLASE --   No results found for this basename:  AMMONIA:5 in the last 168 hours CBC:  Lab 02/15/12 0420 02/14/12 0414 02/13/12 0428 02/12/12 0415 02/11/12 0434 02/08/12 1730  WBC 5.1 4.5 4.8 5.1 5.8 --  NEUTROABS -- -- -- -- -- 3.5  HGB 12.6 13.1 11.7* 12.1 11.7* --  HCT 37.7 38.6 35.9* 35.8* 35.3* --  MCV 81.4 81.6 83.1 81.7 82.3 --  PLT 262 268 242 219 211 --   Cardiac Enzymes: No results found for this basename: CKTOTAL:5,CKMB:5,CKMBINDEX:5,TROPONINI:5 in the last 168 hours BNP: No components found with this basename: POCBNP:5 CBG:  Lab 02/15/12 1156 02/15/12 0729 02/14/12 2007 02/14/12 1654 02/14/12 1318  GLUCAP 259* 185* 199* 158* 159*    Time coordinating discharge:  Greater than 30 minutes  Signed:  Jaylissa Felty, DO [facility] Pager: [phone] 02/15/2012, 12:53 PM

## 2012-02-15 NOTE — Progress Notes (Signed)
  Subjective: She had some cramping after lunch yesterday, and was kept overnight again.  She has had supper and breakfast and feels fine this AM.  Objective: Vital signs in last 24 hours: Temp:  [97.7 F (36.5 C)-98.7 F (37.1 C)] 97.7 F (36.5 C) (01/10 0550) Pulse Rate:  [60-68] 60  (01/10 0550) Resp:  [18] 18  (01/10 0550) BP: (127-174)/(79-97) 147/82 mmHg (01/10 0550) SpO2:  [96 %-99 %] 96 % (01/10 0550) Last BM Date: 02/14/12  Diet: low fiber, 1740 ml PO recorded, + stool, afebrile, BP up some, K+ 3.2 other labs OK  Intake/Output from previous day: 01/09 0701 - 01/10 0700 In: 1740 [P.O.:1740] Out: 3150 [Urine:3150] Intake/Output this shift:    General appearance: alert, cooperative and no distress GI: soft, non-tender; bowel sounds normal; no masses,  no organomegaly  Lab Results:   [facility] 02/15/12 0420 02/14/12 0414  WBC 5.1 4.5  HGB 12.6 13.1  HCT 37.7 38.6  PLT 262 268    BMET  Basename 02/15/12 0420 02/14/12 0500  NA 133* 135  K 3.2* 3.5  CL 100 102  CO2 20 20  GLUCOSE 185* 146*  BUN 8 3*  CREATININE 0.96 0.80  CALCIUM 9.5 9.8   PT/INR No results found for this basename: LABPROT:2,INR:2 in the last 72 hours   Lab 02/09/12 0415 02/08/12 1730  AST 49* 44*  ALT 44* 40*  ALKPHOS 110 113  BILITOT 0.5 0.5  PROT 8.3 8.2  ALBUMIN 3.9 3.8     Lipase     Component Value Date/Time   LIPASE 33 02/08/2012 1730     Studies/Results: No results found.  Medications:    . amLODipine  5 mg Oral QHS  . atenolol  50 mg Oral BID  . bisacodyl  10 mg Rectal Daily  . chlorhexidine  15 mL Mouth/Throat QID  . enoxaparin (LOVENOX) injection  40 mg Subcutaneous Q24H  . insulin aspart  0-15 Units Subcutaneous TID WC  . insulin aspart  0-5 Units Subcutaneous QHS  . lip balm  1 application Topical BID  . pantoprazole  40 mg Oral BID AC    Assessment/Plan SBO, hx of incarcerated Ventral hernia repair 2009 SG with SBO in 2011, and 2012.  Hx of  hysterectomy and 2 C sections  Chronic pain, since 2008  AODM  Hx of MI  Body mass index is 35.7   Plan:  Home today she appears to have resolved her SBO.  LOS: 7 days    JENNINGS,WILLARD 02/15/2012  Doing fine.  She denies any pain this morning after eating breakfast.  Bowels functioning.  No evidence of bowel obstruction.

## 2012-03-12 ENCOUNTER — Ambulatory Visit
Admission: RE | Admit: 2012-03-12 | Discharge: 2012-03-12 | Disposition: A | Discharge status: Home / Self Care | Payer: Managed Care, Other (non HMO) | Source: Ambulatory Visit | Attending: Family Medicine | Admitting: Family Medicine

## 2012-03-12 DIAGNOSIS — Z1231 Encounter for screening mammogram for malignant neoplasm of breast: Secondary | ICD-10-CM

## 2012-06-17 ENCOUNTER — Encounter: Payer: Managed Care, Other (non HMO) | Admitting: Neurology

## 2012-06-19 ENCOUNTER — Other Ambulatory Visit: Payer: Self-pay | Admitting: Neurology

## 2012-06-23 ENCOUNTER — Ambulatory Visit (INDEPENDENT_AMBULATORY_CARE_PROVIDER_SITE_OTHER): Payer: Managed Care, Other (non HMO) | Admitting: Neurology

## 2012-06-23 ENCOUNTER — Ambulatory Visit (INDEPENDENT_AMBULATORY_CARE_PROVIDER_SITE_OTHER): Payer: Managed Care, Other (non HMO) | Admitting: Radiology

## 2012-06-23 ENCOUNTER — Encounter: Payer: Managed Care, Other (non HMO) | Admitting: Neurology

## 2012-06-23 ENCOUNTER — Encounter: Payer: Managed Care, Other (non HMO) | Admitting: Radiology

## 2012-06-23 DIAGNOSIS — G5601 Carpal tunnel syndrome, right upper limb: Secondary | ICD-10-CM

## 2012-06-23 DIAGNOSIS — G5602 Carpal tunnel syndrome, left upper limb: Secondary | ICD-10-CM

## 2012-06-23 DIAGNOSIS — G5600 Carpal tunnel syndrome, unspecified upper limb: Secondary | ICD-10-CM

## 2012-06-23 DIAGNOSIS — Z0289 Encounter for other administrative examinations: Secondary | ICD-10-CM

## 2012-06-23 NOTE — Procedures (Signed)
  HISTORY:  [name] is a 57 year old patient with a history of diabetes who reports a several month history of numbness in both hands, right greater than left. The patient denies any neck pain or pain down the arms. The patient is being evaluated for a possible neuropathy or a cervical radiculopathy.  NERVE CONDUCTION STUDIES:  Nerve conduction studies were performed on both upper extremities. The distal motor latencies for the median nerves were prolonged bilaterally, with normal motor amplitudes for these nerves bilaterally. The distal motor latencies and motor amplitudes for the ulnar nerves were normal bilaterally. The F wave latencies and nerve conduction velocities for the median and ulnar nerves were normal bilaterally. The sensory latencies for the median nerves were prolonged bilaterally, normal for the ulnar nerves bilaterally.  EMG STUDIES:  EMG study was performed on the right upper extremity:  The first dorsal interosseous muscle reveals 2 to 4 K units with full recruitment. No fibrillations or positive waves were noted. The abductor pollicis brevis muscle reveals 2 to 4 K units with full recruitment. No fibrillations or positive waves were noted. The extensor indicis proprius muscle reveals 1 to 3 K units with full recruitment. No fibrillations or positive waves were noted. The pronator teres muscle reveals 2 to 3 K units with full recruitment. No fibrillations or positive waves were noted. The biceps muscle reveals 1 to 2 K units with full recruitment. No fibrillations or positive waves were noted. The triceps muscle reveals 2 to 4 K units with full recruitment. No fibrillations or positive waves were noted. The anterior deltoid muscle reveals 2 to 3 K units with full recruitment. No fibrillations or positive waves were noted. The cervical paraspinal muscles were tested at 2 levels. No abnormalities of insertional activity were seen at either level tested. There was fair  relaxation.  The patient did not wish to have EMG evaluation on the left arm.  IMPRESSION:  Nerve conduction studies done on both upper extremities shows evidence of bilateral mild carpal tunnel syndromes. EMG evaluation of the right upper extremity was unremarkable, without evidence of an overlying cervical radiculopathy.  Marlan Palau MD 06/23/2012 4:47 PM  [facility] [address] [place], [place] [address]  Phone [phone] Fax [phone]

## 2012-06-25 ENCOUNTER — Encounter: Payer: Managed Care, Other (non HMO) | Admitting: Neurology

## 2012-07-14 ENCOUNTER — Other Ambulatory Visit: Payer: Self-pay | Admitting: Family Medicine

## 2012-07-14 DIAGNOSIS — R911 Solitary pulmonary nodule: Secondary | ICD-10-CM

## 2012-07-16 ENCOUNTER — Ambulatory Visit
Admission: RE | Admit: 2012-07-16 | Discharge: 2012-07-16 | Disposition: A | Discharge status: Home / Self Care | Payer: Managed Care, Other (non HMO) | Source: Ambulatory Visit | Attending: Family Medicine | Admitting: Family Medicine

## 2012-07-16 DIAGNOSIS — R911 Solitary pulmonary nodule: Secondary | ICD-10-CM

## 2012-07-16 MED ORDER — IOHEXOL 300 MG/ML  SOLN
75.0000 mL | Freq: Once | INTRAMUSCULAR | Status: AC | PRN
  Administered 2012-07-16: 75 mL via INTRAVENOUS

## 2012-11-05 DIAGNOSIS — I639 Cerebral infarction, unspecified: Secondary | ICD-10-CM

## 2012-11-05 HISTORY — DX: Cerebral infarction, unspecified: I63.9

## 2012-11-21 ENCOUNTER — Other Ambulatory Visit: Payer: Self-pay | Admitting: Family Medicine

## 2012-11-21 DIAGNOSIS — H9190 Unspecified hearing loss, unspecified ear: Secondary | ICD-10-CM

## 2012-11-21 DIAGNOSIS — R519 Headache, unspecified: Secondary | ICD-10-CM

## 2012-11-21 DIAGNOSIS — H9209 Otalgia, unspecified ear: Secondary | ICD-10-CM

## 2012-11-23 ENCOUNTER — Ambulatory Visit
Admission: RE | Admit: 2012-11-23 | Discharge: 2012-11-23 | Disposition: A | Discharge status: Home / Self Care | Payer: Managed Care, Other (non HMO) | Source: Ambulatory Visit | Attending: Family Medicine | Admitting: Family Medicine

## 2012-11-23 DIAGNOSIS — H9190 Unspecified hearing loss, unspecified ear: Secondary | ICD-10-CM

## 2012-11-23 DIAGNOSIS — H9209 Otalgia, unspecified ear: Secondary | ICD-10-CM

## 2012-11-23 DIAGNOSIS — R519 Headache, unspecified: Secondary | ICD-10-CM

## 2012-12-02 ENCOUNTER — Observation Stay (HOSPITAL_COMMUNITY)
Admission: EM | Admit: 2012-12-02 | Discharge: 2012-12-04 | Disposition: A | Discharge status: Home / Self Care | Payer: 59 | Attending: Internal Medicine | Admitting: Internal Medicine

## 2012-12-02 ENCOUNTER — Encounter (HOSPITAL_COMMUNITY): Payer: Self-pay | Admitting: Emergency Medicine

## 2012-12-02 ENCOUNTER — Emergency Department (HOSPITAL_COMMUNITY): Payer: 59

## 2012-12-02 DIAGNOSIS — I160 Hypertensive urgency: Secondary | ICD-10-CM | POA: Diagnosis present

## 2012-12-02 DIAGNOSIS — Z7982 Long term (current) use of aspirin: Secondary | ICD-10-CM | POA: Insufficient documentation

## 2012-12-02 DIAGNOSIS — R0781 Pleurodynia: Secondary | ICD-10-CM

## 2012-12-02 DIAGNOSIS — I252 Old myocardial infarction: Secondary | ICD-10-CM | POA: Insufficient documentation

## 2012-12-02 DIAGNOSIS — G459 Transient cerebral ischemic attack, unspecified: Secondary | ICD-10-CM | POA: Diagnosis present

## 2012-12-02 DIAGNOSIS — N179 Acute kidney failure, unspecified: Secondary | ICD-10-CM

## 2012-12-02 DIAGNOSIS — E876 Hypokalemia: Secondary | ICD-10-CM | POA: Insufficient documentation

## 2012-12-02 DIAGNOSIS — I672 Cerebral atherosclerosis: Secondary | ICD-10-CM | POA: Insufficient documentation

## 2012-12-02 DIAGNOSIS — R209 Unspecified disturbances of skin sensation: Secondary | ICD-10-CM | POA: Insufficient documentation

## 2012-12-02 DIAGNOSIS — Z794 Long term (current) use of insulin: Secondary | ICD-10-CM | POA: Insufficient documentation

## 2012-12-02 DIAGNOSIS — I6529 Occlusion and stenosis of unspecified carotid artery: Secondary | ICD-10-CM | POA: Insufficient documentation

## 2012-12-02 DIAGNOSIS — I639 Cerebral infarction, unspecified: Secondary | ICD-10-CM

## 2012-12-02 DIAGNOSIS — M6281 Muscle weakness (generalized): Secondary | ICD-10-CM | POA: Insufficient documentation

## 2012-12-02 DIAGNOSIS — IMO0001 Reserved for inherently not codable concepts without codable children: Secondary | ICD-10-CM | POA: Insufficient documentation

## 2012-12-02 DIAGNOSIS — E860 Dehydration: Secondary | ICD-10-CM

## 2012-12-02 DIAGNOSIS — K56609 Unspecified intestinal obstruction, unspecified as to partial versus complete obstruction: Secondary | ICD-10-CM

## 2012-12-02 DIAGNOSIS — R0602 Shortness of breath: Secondary | ICD-10-CM

## 2012-12-02 DIAGNOSIS — E669 Obesity, unspecified: Secondary | ICD-10-CM | POA: Diagnosis present

## 2012-12-02 DIAGNOSIS — E785 Hyperlipidemia, unspecified: Secondary | ICD-10-CM

## 2012-12-02 DIAGNOSIS — I6522 Occlusion and stenosis of left carotid artery: Secondary | ICD-10-CM

## 2012-12-02 DIAGNOSIS — R51 Headache: Secondary | ICD-10-CM

## 2012-12-02 DIAGNOSIS — Z6830 Body mass index (BMI) 30.0-30.9, adult: Secondary | ICD-10-CM | POA: Insufficient documentation

## 2012-12-02 DIAGNOSIS — I959 Hypotension, unspecified: Secondary | ICD-10-CM

## 2012-12-02 DIAGNOSIS — N390 Urinary tract infection, site not specified: Secondary | ICD-10-CM | POA: Insufficient documentation

## 2012-12-02 DIAGNOSIS — E119 Type 2 diabetes mellitus without complications: Secondary | ICD-10-CM | POA: Diagnosis present

## 2012-12-02 DIAGNOSIS — I6350 Cerebral infarction due to unspecified occlusion or stenosis of unspecified cerebral artery: Principal | ICD-10-CM | POA: Insufficient documentation

## 2012-12-02 DIAGNOSIS — I10 Essential (primary) hypertension: Secondary | ICD-10-CM | POA: Insufficient documentation

## 2012-12-02 DIAGNOSIS — J189 Pneumonia, unspecified organism: Secondary | ICD-10-CM

## 2012-12-02 DIAGNOSIS — R4789 Other speech disturbances: Secondary | ICD-10-CM | POA: Insufficient documentation

## 2012-12-02 DIAGNOSIS — I2510 Atherosclerotic heart disease of native coronary artery without angina pectoris: Secondary | ICD-10-CM | POA: Diagnosis present

## 2012-12-02 DIAGNOSIS — R519 Headache, unspecified: Secondary | ICD-10-CM | POA: Diagnosis present

## 2012-12-02 LAB — RAPID URINE DRUG SCREEN, HOSP PERFORMED
Amphetamines: NOT DETECTED
Cocaine: NOT DETECTED
Opiates: NOT DETECTED
Tetrahydrocannabinol: NOT DETECTED

## 2012-12-02 LAB — DIFFERENTIAL
Basophils Relative: 0 % (ref 0–1)
Eosinophils Absolute: 0.4 10*3/uL (ref 0.0–0.7)
Lymphs Abs: 2.9 10*3/uL (ref 0.7–4.0)
Monocytes Absolute: 0.5 10*3/uL (ref 0.1–1.0)
Monocytes Relative: 8 % (ref 3–12)

## 2012-12-02 LAB — COMPREHENSIVE METABOLIC PANEL
Albumin: 4.1 g/dL (ref 3.5–5.2)
BUN: 24 mg/dL — ABNORMAL HIGH (ref 6–23)
Creatinine, Ser: 1.02 mg/dL (ref 0.50–1.10)
GFR calc Af Amer: 69 mL/min — ABNORMAL LOW (ref >90)
Glucose, Bld: 109 mg/dL — ABNORMAL HIGH (ref 70–99)
Total Bilirubin: 0.5 mg/dL (ref 0.3–1.2)
Total Protein: 8.6 g/dL — ABNORMAL HIGH (ref 6.0–8.3)

## 2012-12-02 LAB — CBC
HCT: 41 % (ref 36.0–46.0)
Hemoglobin: 14.5 g/dL (ref 12.0–15.0)
MCH: 28.9 pg (ref 26.0–34.0)
MCHC: 35.4 g/dL (ref 30.0–36.0)
MCV: 81.7 fL (ref 78.0–100.0)

## 2012-12-02 LAB — GLUCOSE, CAPILLARY: Glucose-Capillary: 108 mg/dL — ABNORMAL HIGH (ref 70–99)

## 2012-12-02 MED ORDER — DIPHENHYDRAMINE HCL 50 MG/ML IJ SOLN
25.0000 mg | Freq: Once | INTRAMUSCULAR | Status: AC
  Administered 2012-12-02: 25 mg via INTRAVENOUS
  Filled 2012-12-02: qty 1

## 2012-12-02 MED ORDER — METOCLOPRAMIDE HCL 5 MG/ML IJ SOLN
10.0000 mg | Freq: Once | INTRAMUSCULAR | Status: AC
  Administered 2012-12-02: 10 mg via INTRAVENOUS
  Filled 2012-12-02: qty 2

## 2012-12-02 NOTE — ED Notes (Signed)
Pt states that her BP was 170/116.  States that she has a headache and feels jittery.

## 2012-12-02 NOTE — ED Provider Notes (Signed)
CSN: [id number]     Arrival date & time 12/02/12  1752 History   First MD Initiated Contact with Patient 12/02/12 2035     Chief Complaint  Patient presents with  . Hypertension   (Consider location/radiation/quality/duration/timing/severity/associated sxs/prior Treatment) HPI Comments: 57 year old female presents with transient slurred speech and left arm and leg numbness. She did have a headache for 2 weeks and had a normal MRI of one week ago. The headache is continued to get gradually worse since then. She is talking to her doctor about on the phone was having some slurred speech about 7 or 8 hours ago. She states this lasts about 30 minutes. Doctor's office and call her back later advised to go to the ER to be evaluated for a stroke. She states that since she was in triage (about 3 hours prior to me seeing patient) she started having left hand and left foot numbness. There is no associated weakness. She has no blurry vision. The pain is a 10 out of 10 is like someone is hitting her in the head. She's not any fevers.   Past Medical History  Diagnosis Date  . Diabetes mellitus   . Hypertension   . Myocardial infarction 2000  . Chronic pain following surgery or procedure 2008    Abdominal pain   Past Surgical History  Procedure Laterality Date  . Hernia repair  2008  . Abdominal hysterectomy  1999    partial  . Abdominal hysterectomy  2000    complete  . Cesarean section       X  2  . Knee arthroscopy      Left  . Abdominal surgery    . Bowel obstruction     Family History  Problem Relation Age of Onset  . CAD Brother      X 2  . CAD Sister      X 1  . Hypertension      ALL   History  Substance Use Topics  . Smoking status: Never Smoker   . Smokeless tobacco: Not on file  . Alcohol Use: Yes     Comment: rarely   OB History   Grav Para Term Preterm Abortions TAB SAB Ect Mult Living                 Review of Systems  Constitutional: Negative for fever.  Eyes:  Negative for photophobia, pain and visual disturbance.  Gastrointestinal: Negative for vomiting and abdominal pain.  Musculoskeletal: Positive for neck pain.  Neurological: Positive for light-headedness, numbness and headaches. Negative for weakness.  All other systems reviewed and are negative.    Allergies  Cymbalta; Penicillins; Tramadol; and Trazodone and nefazodone  Home Medications   Current Outpatient Rx  Name  Route  Sig  Dispense  Refill  . atenolol (TENORMIN) 100 MG tablet   Oral   Take 100 mg by mouth daily.         Marland Kitchen gabapentin (NEURONTIN) 300 MG capsule   Oral   Take 300 mg by mouth 2 (two) times daily.         . insulin NPH (HUMULIN N,NOVOLIN N) 100 UNIT/ML injection   Subcutaneous   Inject 20 Units into the skin.          . metFORMIN (GLUCOPHAGE) 500 MG tablet   Oral   Take 500 mg by mouth 2 (two) times daily with a meal.         . potassium chloride (K-DUR) 10 MEQ  tablet   Oral   Take 10 mEq by mouth daily.         . rosuvastatin (CRESTOR) 40 MG tablet   Oral   Take 40 mg by mouth daily.          BP 179/91  Pulse 113  Temp(Src) 97.8 F (36.6 C) (Oral)  Resp 24  SpO2 100% Physical Exam  Vitals reviewed. Constitutional: She is oriented to person, place, and time. She appears well-developed and well-nourished.  HENT:  Head: Normocephalic and atraumatic.  Right Ear: External ear normal.  Left Ear: External ear normal.  Nose: Nose normal.  Eyes: EOM are normal. Pupils are equal, round, and reactive to light. Right eye exhibits no discharge. Left eye exhibits no discharge.  Neck: Neck supple.  Cardiovascular: Normal rate, regular rhythm and normal heart sounds.   Pulmonary/Chest: Effort normal and breath sounds normal.  Abdominal: Soft. There is no tenderness.  Neurological: She is alert and oriented to person, place, and time. She has normal strength. No cranial nerve deficit.  5 out of 5 strength in all 4 extremities. She states that  light touch feels "different" in her left arm and left lower extremity  Skin: Skin is warm and dry.  Psychiatric: Her speech is normal.    ED Course  Procedures (including critical care time) Labs Review Labs Reviewed  DIFFERENTIAL - Abnormal; Notable for the following:    Neutrophils Relative % 33 (*)    Lymphocytes Relative 52 (*)    Eosinophils Relative 7 (*)    All other components within normal limits  COMPREHENSIVE METABOLIC PANEL - Abnormal; Notable for the following:    Potassium 3.4 (*)    Glucose, Bld 109 (*)    BUN 24 (*)    Total Protein 8.6 (*)    GFR calc non Af Amer 59 (*)    GFR calc Af Amer 69 (*)    All other components within normal limits  GLUCOSE, CAPILLARY - Abnormal; Notable for the following:    Glucose-Capillary 108 (*)    All other components within normal limits  ETHANOL  PROTIME-INR  APTT  CBC  URINE RAPID DRUG SCREEN (HOSP PERFORMED)  URINALYSIS, ROUTINE W REFLEX MICROSCOPIC   Imaging Review Ct Head Wo Contrast  12/02/2012   CLINICAL DATA:  left-sided weakness and slurred speech  EXAM:  HEAD CT WITHOUT CONTRAST  COMPARISON:  brain CT November 07, 2009 and brain MRI November 23, 2012  FINDINGS: The ventricles are normal in size and configuration. There is no mass, hemorrhage, extra-axial fluid collection, or midline shift. There is a punctate calcification in the left anterior parietal lobe which may represent a small granuloma. Elsewhere gray-white compartments are normal. There is no demonstrable acute infarct. Bony calvarium appears intact. Mastoid air cells are clear.  IMPRESSION: Punctate calcification in the anterior left parietal lobe consistent with a small granuloma. Study otherwise unremarkable.   Electronically Signed   By: Bretta Bang M.D.   On: 12/02/2012 21:50    EKG Interpretation     Ventricular Rate:  53 PR Interval:  174 QRS Duration: 98 QT Interval:  488 QTC Calculation: 457 R Axis:   42 Text Interpretation:  Sinus  bradycardia Otherwise normal ECG No significant change was found            MDM   1. Headache   2. Hypertension    Discussed patient with Dr. Cyril Mourning of neurology who states patient should not because code stroke as her  only symptoms at this current time her headache and numbness. She has no slurred speech at this time. Her CT and other workup are normal. Her headache improved with a headache cocktail. Will admit to the hospitalist for TIA workup.    Audree Camel, MD 12/02/12 2237

## 2012-12-02 NOTE — H&P (Signed)
[facility] History and Physical  DRISANA SCHWEICKERT GNF:[id number] DOB: 26-Nov-1955 DOA: 12/02/2012  Referring physician: D. PCP: Emeterio Reeve, MD   Chief Complaint: Frontal headaches for 2 weeks Elevated blood pressure for 2 weeks Tingling and numbness of left extremities with slurred speech since one day  HPI:  57 year old obese female with history of hypertension, diabetes mellitus on insulin, hyperlipidemia who presented to the ED with tingling and on his upper left extremity lasting for several minutes today. For past 2 weeks she has been having throbbing frontal headaches lasting throughout the day without any aggravating or relieving symptoms. She also has been noticing her blood pressure to be quite high since the same time in the range of 160-190/ 90-116 mmHg). She had come to the ED one week back with ongoing headache when she had an MRI of brain  done which was unremarkable Patient reports taking her medications regularly. She denies any blurred vision, dizziness, nausea or vomiting. Denies any chest pain, palpitations, shortness of breath, abdominal pain, bowel or urinary symptoms. This afternoon she noticed tingling and numbness of her left upper and lower extremities lasting for few minutes and she called her PCP. It was was talking to her PCP she also noticed some slurring speech. The doctor on call told her to come to the ED. Patient denies similar symptoms in the past. Denies history of migraine. She does report some visual discomfort to bright light today. She denies any change in her weight or appetite.  Course in the ED Patient was noted to have elevated blood pressure. A head CT was done which was unremarkable. She did not have any neurological deficit. She was given a bolus of IV Reglan and Benadryl after which her headache improved. Prior hospitalists called for admission and observation for TIA-like symptoms. Neurology was consulted by ED physician with concern for  code stroke and recommended admission to hospitalist service for possible TIA. Patient did not have any tingling numbness of her extremities upon presentation.  Review of Systems:  Constitutional: Denies fever, chills, diaphoresis, appetite change and fatigue.  HEENT: Denies photophobia, eye pain, redness, hearing loss, ear pain, congestion, sore throat, rhinorrhea, sneezing, mouth sores, trouble swallowing, neck pain, neck stiffness and tinnitus.   Respiratory: Denies SOB, DOE, cough, chest tightness,  and wheezing.   Cardiovascular: Denies chest pain, palpitations and leg swelling.  Gastrointestinal: Denies nausea, vomiting, abdominal pain, diarrhea, constipation, blood in stool and abdominal distention.  Genitourinary: Denies dysuria, urgency, frequency, hematuria, flank pain and difficulty urinating.  Endocrine: Denies  polyuria, polydipsia. Musculoskeletal: Denies myalgias, back pain, joint swelling, arthralgias and gait problem.  Skin: Denies pallor, rash and wound.  Neurological: Severe frontal headaches, tingling and numbness of the left extremities. Denies dizziness, seizures, syncope, weakness, light-headedness.    Past Medical History  Diagnosis Date  . Diabetes mellitus   . Hypertension   . Myocardial infarction 2000  . Chronic pain following surgery or procedure 2008    Abdominal pain   Past Surgical History  Procedure Laterality Date  . Hernia repair  2008  . Abdominal hysterectomy  1999    partial  . Abdominal hysterectomy  2000    complete  . Cesarean section       X  2  . Knee arthroscopy      Left  . Abdominal surgery    . Bowel obstruction     Social History:  reports that she has never smoked. She does not have any smokeless tobacco history on  file. She reports that she drinks alcohol. She reports that she does not use illicit drugs.  Allergies  Allergen Reactions  . Cymbalta [Duloxetine Hcl] Other (See Comments)    dizzy  . Penicillins Other (See  Comments)    unknown  . Tramadol     unknown  . Trazodone And Nefazodone     Family History  Problem Relation Age of Onset  . CAD Brother      X 2  . CAD Sister      X 1  . Hypertension      ALL    Prior to Admission medications   Medication Sig Start Date End Date Taking? Authorizing Provider  atenolol (TENORMIN) 100 MG tablet Take 100 mg by mouth daily.   Yes Historical Provider, MD  gabapentin (NEURONTIN) 300 MG capsule Take 300 mg by mouth 2 (two) times daily.   Yes Historical Provider, MD  insulin NPH (HUMULIN N,NOVOLIN N) 100 UNIT/ML injection Inject 20 Units into the skin.    Yes Historical Provider, MD  metFORMIN (GLUCOPHAGE) 500 MG tablet Take 500 mg by mouth 2 (two) times daily with a meal.   Yes Historical Provider, MD  potassium chloride (K-DUR) 10 MEQ tablet Take 10 mEq by mouth daily.   Yes Historical Provider, MD  rosuvastatin (CRESTOR) 40 MG tablet Take 40 mg by mouth daily.   Yes Historical Provider, MD    Physical Exam:  Filed Vitals:   12/02/12 1822 12/02/12 2022 12/02/12 2130  BP: 193/94 179/91   Pulse: 61 113   Temp: 98.5 F (36.9 C) 97.5 F (36.4 C) 97.8 F (36.6 C)  TempSrc: Oral Oral   Resp: 18 24   SpO2: 95% 100%     Constitutional: Vital signs reviewed.  Patient is an obese female lying in bed in no acute distress HEENT: No pallor, no icterus, moist oral mucosa,  Cardiovascular: RRR, S1 normal, S2 normal, no MRG,  Pulmonary/Chest: CTAB, no wheezes, rales, or rhonchi Abdominal: Soft. Non-tender, non-distended, bowel sounds are normal, no masses, organomegaly, or guarding present.   extremities: Warm, no edema excise  CNS: AAO x3, cranial nerves 2 - 12 intact, normal motor tone, power and reflex, normal sensations, intact cerebellar function.  Labs on Admission:  Basic Metabolic Panel:  Recent Labs Lab 12/02/12 2125  NA 135  K 3.4*  CL 99  CO2 23  GLUCOSE 109*  BUN 24*  CREATININE 1.02  CALCIUM 10.2   Liver Function  Tests:  Recent Labs Lab 12/02/12 2125  AST 29  ALT 24  ALKPHOS 100  BILITOT 0.5  PROT 8.6*  ALBUMIN 4.1   No results found for this basename: LIPASE, AMYLASE,  in the last 168 hours No results found for this basename: AMMONIA,  in the last 168 hours CBC:  Recent Labs Lab 12/02/12 2125  WBC 5.6  NEUTROABS 1.9  HGB 14.5  HCT 41.0  MCV 81.7  PLT 220   Cardiac Enzymes: No results found for this basename: CKTOTAL, CKMB, CKMBINDEX, TROPONINI,  in the last 168 hours BNP: No components found with this basename: POCBNP,  CBG:  Recent Labs Lab 12/02/12 2103  GLUCAP 108*    Radiological Exams on Admission: Ct Head Wo Contrast  12/02/2012   CLINICAL DATA:  left-sided weakness and slurred speech  EXAM:  HEAD CT WITHOUT CONTRAST  COMPARISON:  brain CT November 07, 2009 and brain MRI November 23, 2012  FINDINGS: The ventricles are normal in size and configuration. There is no  mass, hemorrhage, extra-axial fluid collection, or midline shift. There is a punctate calcification in the left anterior parietal lobe which may represent a small granuloma. Elsewhere gray-white compartments are normal. There is no demonstrable acute infarct. Bony calvarium appears intact. Mastoid air cells are clear.  IMPRESSION: Punctate calcification in the anterior left parietal lobe consistent with a small granuloma. Study otherwise unremarkable.   Electronically Signed   By: Bretta Bang M.D.   On: 12/02/2012 21:50    EKG: Sinus bradycardia at 53, no ST-T changes  Assessment/Plan  Principal Problem:   TIA (transient ischemic attack) Like in the setting of uncontrolled hypertension with headaches. Recent MRI one week back was negative however patient has new symptoms of left-sided numbness and slurred speech today. Admit to telemetry. Head CT negative. We did repeat MRI and MRA of the brain to rule out stroke given new symptoms. -Neurochecks every 4 hours -check A1c and lipid panel -Resume home  dose of atenolol. We'll place her on when necessary hydralazine for elevated BP. Patient counseled on dietary adherence and blood pressure medication should be titrated for better BP control. -Discussed with neuro hospitalists who agrees with the plan and will consult patient tomorrow.   Active Problems:   Hypertensive urgency As outlined above. Poorly controlled blood pressure. Continue atenolol. Added when necessary hydralazine. Needs blood pressure medication adjustment during hospitalization.    Headache Tylenol when necessary. Added when necessary toradol  severe headache. Prn  Reglan for nausea.    Diabetes mellitus Check hemoglobin A1c. We'll metformin. I will place her on Levemir 20 units daily. Continue sliding scale insulin    CAD (coronary artery disease) Patient reports being on baby aspirin. Continue statin    Obesity (BMI 30-39.9)  Needs counseling on dietary adherence and weight reduction     Diet: Diabetic DVT prophylaxis: Subcutaneous lovenox  Code Status: Full code Family Communication: None at bedside Disposition Plan: Home once workup completed  Eddie North [facility] Pager [phone]  If 7PM-7AM, please contact night-coverage [email or web address] Password [facility] 12/02/2012, 11:29 PM    Total time spent: 55 minutes

## 2012-12-02 NOTE — ED Notes (Signed)
Patient is unable to void at moment.

## 2012-12-02 NOTE — ED Notes (Addendum)
Pt c/o abdominal pain x 2 weeks that was not relieved by otc medications, and samples from her doctor she saw last week. Pt c/o blurred vision, L sided weakness, numbness and tinglin in L arm. Pt sts. She called her doctor today and that her words started to slur on the phone so they instructed her to come to the ED. A&Ox4. Pt denies N/V, fevers, chills.

## 2012-12-03 ENCOUNTER — Encounter (HOSPITAL_COMMUNITY): Payer: Self-pay

## 2012-12-03 ENCOUNTER — Observation Stay (HOSPITAL_COMMUNITY): Payer: 59

## 2012-12-03 DIAGNOSIS — I517 Cardiomegaly: Secondary | ICD-10-CM

## 2012-12-03 DIAGNOSIS — I6350 Cerebral infarction due to unspecified occlusion or stenosis of unspecified cerebral artery: Secondary | ICD-10-CM

## 2012-12-03 LAB — POCT I-STAT TROPONIN I: Troponin i, poc: 0 ng/mL (ref 0.00–0.08)

## 2012-12-03 LAB — URINALYSIS, ROUTINE W REFLEX MICROSCOPIC
Bilirubin Urine: NEGATIVE
Glucose, UA: NEGATIVE mg/dL
Hgb urine dipstick: NEGATIVE
Specific Gravity, Urine: 1.018 (ref 1.005–1.030)
Urobilinogen, UA: 1 mg/dL (ref 0.0–1.0)

## 2012-12-03 LAB — POCT I-STAT, CHEM 8
Creatinine, Ser: 1.2 mg/dL — ABNORMAL HIGH (ref 0.50–1.10)
HCT: 44 % (ref 36.0–46.0)
Hemoglobin: 15 g/dL (ref 12.0–15.0)
Potassium: 3.7 mEq/L (ref 3.5–5.1)
Sodium: 140 mEq/L (ref 135–145)
TCO2: 23 mmol/L (ref 0–100)

## 2012-12-03 LAB — URINE MICROSCOPIC-ADD ON

## 2012-12-03 LAB — RAPID URINE DRUG SCREEN, HOSP PERFORMED
Amphetamines: NOT DETECTED
Cocaine: NOT DETECTED
Opiates: NOT DETECTED
Tetrahydrocannabinol: NOT DETECTED

## 2012-12-03 LAB — GLUCOSE, CAPILLARY
Glucose-Capillary: 119 mg/dL — ABNORMAL HIGH (ref 70–99)
Glucose-Capillary: 102 mg/dL — ABNORMAL HIGH (ref 70–99)

## 2012-12-03 LAB — HEMOGLOBIN A1C
Hgb A1c MFr Bld: 7 % — ABNORMAL HIGH (ref <5.7)
Mean Plasma Glucose: 154 mg/dL — ABNORMAL HIGH (ref <117)

## 2012-12-03 LAB — LIPID PANEL
HDL: 58 mg/dL (ref >39)
LDL Cholesterol: 143 mg/dL — ABNORMAL HIGH (ref 0–99)
Triglycerides: 176 mg/dL — ABNORMAL HIGH (ref <150)

## 2012-12-03 MED ORDER — HYDRALAZINE HCL 25 MG PO TABS
25.0000 mg | ORAL_TABLET | Freq: Four times a day (QID) | ORAL | Status: DC | PRN
  Filled 2012-12-03: qty 1

## 2012-12-03 MED ORDER — ONDANSETRON HCL 4 MG/2ML IJ SOLN: 4.0000 mg | Freq: Four times a day (QID) | INTRAMUSCULAR | Status: DC | PRN

## 2012-12-03 MED ORDER — KETOROLAC TROMETHAMINE 15 MG/ML IJ SOLN
15.0000 mg | Freq: Four times a day (QID) | INTRAMUSCULAR | Status: DC | PRN
  Administered 2012-12-03 – 2012-12-04 (×5): 15 mg via INTRAVENOUS
  Filled 2012-12-03 (×5): qty 1

## 2012-12-03 MED ORDER — ENOXAPARIN SODIUM 40 MG/0.4ML ~~LOC~~ SOLN
40.0000 mg | Freq: Every day | SUBCUTANEOUS | Status: DC
  Administered 2012-12-03 – 2012-12-04 (×2): 40 mg via SUBCUTANEOUS
  Filled 2012-12-03 (×2): qty 0.4

## 2012-12-03 MED ORDER — INSULIN ASPART 100 UNIT/ML ~~LOC~~ SOLN
0.0000 [IU] | Freq: Three times a day (TID) | SUBCUTANEOUS | Status: DC
  Administered 2012-12-03 (×2): 2 [IU] via SUBCUTANEOUS
  Administered 2012-12-04: 3 [IU] via SUBCUTANEOUS
  Administered 2012-12-04: 2 [IU] via SUBCUTANEOUS

## 2012-12-03 MED ORDER — INSULIN DETEMIR 100 UNIT/ML ~~LOC~~ SOLN
20.0000 [IU] | Freq: Every day | SUBCUTANEOUS | Status: DC
  Administered 2012-12-03 – 2012-12-04 (×2): 20 [IU] via SUBCUTANEOUS
  Filled 2012-12-03 (×2): qty 0.2

## 2012-12-03 MED ORDER — ENOXAPARIN SODIUM 40 MG/0.4ML ~~LOC~~ SOLN: 40.0000 mg | SUBCUTANEOUS | Status: DC

## 2012-12-03 MED ORDER — METOCLOPRAMIDE HCL 5 MG/ML IJ SOLN: 5.0000 mg | Freq: Four times a day (QID) | INTRAMUSCULAR | Status: DC | PRN

## 2012-12-03 MED ORDER — ATORVASTATIN CALCIUM 40 MG PO TABS
40.0000 mg | ORAL_TABLET | Freq: Every day | ORAL | Status: DC
  Administered 2012-12-03: 40 mg via ORAL
  Filled 2012-12-03 (×2): qty 1

## 2012-12-03 MED ORDER — ONDANSETRON HCL 4 MG PO TABS: 4.0000 mg | ORAL_TABLET | Freq: Four times a day (QID) | ORAL | Status: DC | PRN

## 2012-12-03 MED ORDER — ACETAMINOPHEN 325 MG PO TABS
650.0000 mg | ORAL_TABLET | Freq: Four times a day (QID) | ORAL | Status: DC | PRN
  Administered 2012-12-03 (×2): 650 mg via ORAL
  Filled 2012-12-03 (×2): qty 2

## 2012-12-03 MED ORDER — VALPROATE SODIUM 500 MG/5ML IV SOLN
1000.0000 mg | Freq: Once | INTRAVENOUS | Status: AC
  Administered 2012-12-03: 1000 mg via INTRAVENOUS
  Filled 2012-12-03: qty 10

## 2012-12-03 MED ORDER — GABAPENTIN 300 MG PO CAPS
300.0000 mg | ORAL_CAPSULE | Freq: Two times a day (BID) | ORAL | Status: DC
  Administered 2012-12-03 – 2012-12-04 (×4): 300 mg via ORAL
  Filled 2012-12-03 (×5): qty 1

## 2012-12-03 MED ORDER — ACETAMINOPHEN 650 MG RE SUPP: 650.0000 mg | Freq: Four times a day (QID) | RECTAL | Status: DC | PRN

## 2012-12-03 MED ORDER — SODIUM CHLORIDE 0.9 % IJ SOLN
3.0000 mL | Freq: Two times a day (BID) | INTRAMUSCULAR | Status: DC
  Administered 2012-12-03 – 2012-12-04 (×4): 3 mL via INTRAVENOUS

## 2012-12-03 MED ORDER — POTASSIUM CHLORIDE ER 10 MEQ PO TBCR
10.0000 meq | EXTENDED_RELEASE_TABLET | Freq: Every day | ORAL | Status: DC
  Administered 2012-12-03 – 2012-12-04 (×2): 10 meq via ORAL
  Filled 2012-12-03 (×2): qty 1

## 2012-12-03 MED ORDER — ATENOLOL 100 MG PO TABS
100.0000 mg | ORAL_TABLET | Freq: Every day | ORAL | Status: DC
  Administered 2012-12-03 – 2012-12-04 (×2): 100 mg via ORAL
  Filled 2012-12-03 (×2): qty 1

## 2012-12-03 MED ORDER — ASPIRIN EC 325 MG PO TBEC
325.0000 mg | DELAYED_RELEASE_TABLET | Freq: Every day | ORAL | Status: DC
  Administered 2012-12-03 – 2012-12-04 (×2): 325 mg via ORAL
  Filled 2012-12-03 (×2): qty 1

## 2012-12-03 NOTE — Progress Notes (Signed)
[facility] PROGRESS NOTE  [name] NFA:[id number] DOB: 1955-12-25 DOA: 12/02/2012 PCP: Emeterio Reeve, MD  Assessment/Plan: Probable acute infarct- R.Centrum ovale infarct -MRI discussed with neuro/Dr Kirkpatrick>> small and not caused by the L. ICA stenosis since it is contralateral -continue ASA -I have consulted PT/OT -Neuro to follow for further recs Headaches -continue depacon, reglan as recommended per Neuro HTN -Continue atenelol and follow DM, uncontrolled -Continue levemir and ssi, follow Hypokalemia -resolved  Code Status: full Family Communication: none at bedside  Disposition Plan: to home when medically ready   Consultants:  Neuro - Dr Leroy Kennedy  Procedures:  none  Antibiotics:  none  HPI/Subjective: Still with headache but better.  Objective: Filed Vitals:   12/03/12 1403  BP: 161/85  Pulse: 60  Temp: 98.3 F (36.8 C)  Resp: 16    Intake/Output Summary (Last 24 hours) at 12/03/12 1614 Last data filed at 12/03/12 1500  Gross per 24 hour  Intake    240 ml  Output      0 ml  Net    240 ml   Filed Weights   12/03/12 0039  Weight: 99.701 kg (219 lb 12.8 oz)    Exam:  General: alert & oriented x 3 In NAD Cardiovascular: RRR, nl S1 s2 Respiratory: CTAB Abdomen: soft +BS NT/ND, no masses palpable Extremities: No cyanosis and no edema Neuro- strength nl, symmetric   Data Reviewed: Basic Metabolic Panel:  Recent Labs Lab 12/02/12 2125 12/03/12 0013  NA 135 140  K 3.4* 3.7  CL 99 103  CO2 23  --   GLUCOSE 109* 118*  BUN 24* 26*  CREATININE 1.02 1.20*  CALCIUM 10.2  --    Liver Function Tests:  Recent Labs Lab 12/02/12 2125  AST 29  ALT 24  ALKPHOS 100  BILITOT 0.5  PROT 8.6*  ALBUMIN 4.1   No results found for this basename: LIPASE, AMYLASE,  in the last 168 hours No results found for this basename: AMMONIA,  in the last 168 hours CBC:  Recent Labs Lab 12/02/12 2125 12/03/12 0013  WBC 5.6  --    NEUTROABS 1.9  --   HGB 14.5 15.0  HCT 41.0 44.0  MCV 81.7  --   PLT 220  --    Cardiac Enzymes: No results found for this basename: CKTOTAL, CKMB, CKMBINDEX, TROPONINI,  in the last 168 hours BNP (last 3 results) No results found for this basename: PROBNP,  in the last 8760 hours CBG:  Recent Labs Lab 12/02/12 2103 12/03/12 0134 12/03/12 0738 12/03/12 1133  GLUCAP 108* 119* 136* 150*    No results found for this or any previous visit (from the past 240 hour(s)).   Studies: Ct Head Wo Contrast  12/02/2012   CLINICAL DATA:  left-sided weakness and slurred speech  EXAM:  HEAD CT WITHOUT CONTRAST  COMPARISON:  brain CT November 07, 2009 and brain MRI November 23, 2012  FINDINGS: The ventricles are normal in size and configuration. There is no mass, hemorrhage, extra-axial fluid collection, or midline shift. There is a punctate calcification in the left anterior parietal lobe which may represent a small granuloma. Elsewhere gray-white compartments are normal. There is no demonstrable acute infarct. Bony calvarium appears intact. Mastoid air cells are clear.  IMPRESSION: Punctate calcification in the anterior left parietal lobe consistent with a small granuloma. Study otherwise unremarkable.   Electronically Signed   By: Bretta Bang M.D.   On: 12/02/2012 21:50   [facility]  Contrast  12/03/2012   CLINICAL DATA:  New onset of Left arm paresthesias and left-sided weakness. Two week history of headache. Stroke risk factors include hypertension and diabetes as well as coronary artery disease and hyperlipidemia.  EXAM: MRI HEAD WITHOUT CONTRAST  MRA HEAD WITHOUT CONTRAST  TECHNIQUE: Multiplanar, multiecho pulse sequences of the brain and surrounding structures were obtained without intravenous contrast. Angiographic images of the head were obtained using MRA technique without contrast.  COMPARISON:  MRI brain 11/23/2012.  CT head 12/02/2012.  FINDINGS: MRI HEAD FINDINGS  2 mm area of  restricted diffusion affects the right centrum semiovale, not present on 11/23/12 and represents an acute deep white matter infarct.  No significant cerebral or cerebellar atrophy. Abnormal for age areas of subcortical and periventricular white matter signal abnormality representing early small vessel disease.  No foci of acute or chronic hemorrhage. No mass lesion or hydrocephalus. Negative osseous structures. Scalp soft tissues, orbits, sinuses, and mastoids unremarkable.  MRA HEAD FINDINGS  Diminished flow related enhancement in the petrous, cavernous, and proximal supraclinoid internal carotid arteries with estimated 75% stenosis cavernous and supra clinoid segments. 3 mm outpouching laterally from the petrous LICA likely atheromatous in nature. No definite carotid dissection. No right ICA narrowing.  Mild non stenotic irregularity of the basilar artery in its midportion. Both vertebrals contribute to its formation. Distal right vertebral stenosis just above PICA origin approximately 75%.  No proximal right anterior or middle cerebral artery stenosis. 75% stenosis at the origin of the left middle cerebral artery M1 segment. Left A1 ACA widely patent. Mild irregularity bilateral distal MCA branches.  No proximal posterior cerebral artery stenosis. Mild irregularity distal PCA segments bilaterally.  No cerebellar branch occlusion. No visible intracranial berry aneurysm.  IMPRESSION: New deep white matter infarct affecting the right centrum semiovale, 2 mm in size not present on the previous study from 11/23/2012.  Abnormal for age chronic microvascular ischemic change.  Potentially significant flow reducing tandem lesions affecting the petrous, cavernous, and supraclinoid left internal carotid artery; estimated 75% diffuse LICA and left M1 MCA stenoses. 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   Electronically Signed   By: Davonna Belling M.D.   On: 12/03/2012 11:28    Mri Brain Without Contrast  12/03/2012   CLINICAL DATA:  New onset of Left arm paresthesias and left-sided weakness. Two week history of headache. Stroke risk factors include hypertension and diabetes as well as coronary artery disease and hyperlipidemia.  EXAM: MRI HEAD WITHOUT CONTRAST  MRA HEAD WITHOUT CONTRAST  TECHNIQUE: Multiplanar, multiecho pulse sequences of the brain and surrounding structures were obtained without intravenous contrast. Angiographic images of the head were obtained using MRA technique without contrast.  COMPARISON:  MRI brain 11/23/2012.  CT head 12/02/2012.  FINDINGS: MRI HEAD FINDINGS  2 mm area of restricted diffusion affects the right centrum semiovale, not present on 11/23/12 and represents an acute deep white matter infarct.  No significant cerebral or cerebellar atrophy. Abnormal for age areas of subcortical and periventricular white matter signal abnormality representing early small vessel disease.  No foci of acute or chronic hemorrhage. No mass lesion or hydrocephalus. Negative osseous structures. Scalp soft tissues, orbits, sinuses, and mastoids unremarkable.  MRA HEAD FINDINGS  Diminished flow related enhancement in the petrous, cavernous, and proximal supraclinoid internal carotid arteries with estimated 75% stenosis cavernous and supra clinoid segments. 3 mm outpouching laterally from the petrous LICA likely atheromatous in nature. No definite carotid dissection. No  right ICA narrowing.  Mild non stenotic irregularity of the basilar artery in its midportion. Both vertebrals contribute to its formation. Distal right vertebral stenosis just above PICA origin approximately 75%.  No proximal right anterior or middle cerebral artery stenosis. 75% stenosis at the origin of the left middle cerebral artery M1 segment. Left A1 ACA widely patent. Mild irregularity bilateral distal MCA branches.  No proximal posterior cerebral artery stenosis. Mild irregularity distal PCA segments  bilaterally.  No cerebellar branch occlusion. No visible intracranial berry aneurysm.  IMPRESSION: New deep white matter infarct affecting the right centrum semiovale, 2 mm in size not present on the previous study from 11/23/2012.  Abnormal for age chronic microvascular ischemic change.  Potentially significant flow reducing tandem lesions affecting the petrous, cavernous, and supraclinoid left internal carotid artery; estimated 75% diffuse LICA and left M1 MCA stenoses. 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   Electronically Signed   By: Davonna Belling M.D.   On: 12/03/2012 11:28    Scheduled Meds: . aspirin EC  325 mg Oral Daily  . atenolol  100 mg Oral Daily  . atorvastatin  40 mg Oral q1800  . enoxaparin (LOVENOX) injection  40 mg Subcutaneous Daily  . gabapentin  300 mg Oral BID  . insulin aspart  0-15 Units Subcutaneous TID WC  . insulin detemir  20 Units Subcutaneous Daily  . potassium chloride  10 mEq Oral Daily  . sodium chloride  3 mL Intravenous Q12H  . valproate sodium  1,000 mg Intravenous Once   Continuous Infusions:   Principal Problem:   TIA (transient ischemic attack) Active Problems:   Diabetes   CAD (coronary artery disease)   Obesity (BMI 30-39.9)   Hypertensive urgency   Headache    Time spent: 35    [facility]  [facility] Pager [phone]. If 7PM-7AM, please contact night-coverage at [email or web address], password [facility] 12/03/2012, 4:14 PM  LOS: 1 day

## 2012-12-03 NOTE — Progress Notes (Signed)
VASCULAR LAB PRELIMINARY  PRELIMINARY  PRELIMINARY  PRELIMINARY  Carotid duplex completed.    Preliminary report:  Right - 1% to 39% ICA stenosis Vertebral artery flow is antegrade. Left - 80% to 99% ICA stenosis. Vertebral artery flow is antegrade.  Raider Valbuena, RVS 12/03/2012, 3:54 PM

## 2012-12-03 NOTE — Consult Note (Signed)
NEURO HOSPITALIST CONSULT NOTE    Reason for Consult: left arm paresthesias, left sided heaviness, HA  HPI:                                                                                                                                          [name] is an 57 y.o. female with a past medical history significant for HTN, DM, MI, admitted to [facility] due to new onset of the above stated symptoms. She said that she for the past 2 weeks she has been expericiencing severe, pounding HA with sensitivity that can last the whole day associated with sensitivity to light and this prompted a brain MRI approximately 1 week ago which was unremarkable. Then, yesterday the HA was also associated with a sensation of numbness and tingling of the left upper extremity and " heaviness of the left arm and leg".Never had such symptoms before. At the same time, she has been having trouble controlling her BP and denies ever having HA with features consistent with migraine. Denies associated vertigo, double vision, slurred speech, language or vision impairment. CT brain in the ED was unremarkable. No recent fever, infection, head or neck injuries. On aspirin 325 mg daily.   Past Medical History  Diagnosis Date  . Diabetes mellitus   . Hypertension   . Myocardial infarction 2000  . Chronic pain following surgery or procedure 2008    Abdominal pain    Past Surgical History  Procedure Laterality Date  . Hernia repair  2008  . Abdominal hysterectomy  1999    partial  . Abdominal hysterectomy  2000    complete  . Cesarean section       X  2  . Knee arthroscopy      Left  . Abdominal surgery    . Bowel obstruction      Family History  Problem Relation Age of Onset  . CAD Brother      X 2  . CAD Sister      X 1  . Hypertension      ALL    Social History:  reports that she has never smoked. She does not have any smokeless tobacco history on file. She reports that she drinks  alcohol. She reports that she does not use illicit drugs.  Allergies  Allergen Reactions  . Cymbalta [Duloxetine Hcl] Other (See Comments)    dizzy  . Penicillins Other (See Comments)    unknown  . Tramadol     unknown  . Trazodone And Nefazodone     MEDICATIONS:  I have reviewed the patient's current medications.   ROS:                                                                                                                                       History obtained from the patient and chart review.  General ROS: negative for - chills, fatigue, fever, night sweats, weight gain or weight loss Psychological ROS: negative for - behavioral disorder, hallucinations, memory difficulties, mood swings or suicidal ideation Ophthalmic ROS: negative for - blurry vision, double vision, eye pain or loss of vision ENT ROS: negative for - epistaxis, nasal discharge, oral lesions, sore throat, tinnitus or vertigo Allergy and Immunology ROS: negative for - hives or itchy/watery eyes Hematological and Lymphatic ROS: negative for - bleeding problems, bruising or swollen lymph nodes Endocrine ROS: negative for - galactorrhea, hair pattern changes, polydipsia/polyuria or temperature intolerance Respiratory ROS: negative for - cough, hemoptysis, shortness of breath or wheezing Cardiovascular ROS: negative for - chest pain, dyspnea on exertion, edema or irregular heartbeat Gastrointestinal ROS: negative for - abdominal pain, diarrhea, hematemesis, nausea/vomiting or stool incontinence Genito-Urinary ROS: negative for - dysuria, hematuria, incontinence or urinary frequency/urgency Musculoskeletal ROS: negative for - joint swelling or muscular weakness Neurological ROS: as noted in HPI Dermatological ROS: negative for rash and skin lesion changes   Physical exam: pleasant fele in no  apparent distress. Head: normocephalic. Neck: supple, no bruits, no JVD. Cardiac: no murmurs. Lungs: clear. Abdomen: soft, no tender, no mass. Extremities: no edema.   Neurologic Examination:                                                                                                      Mental Status: Alert, oriented, thought content appropriate.  Speech fluent without evidence of aphasia.  Able to follow 3 step commands without difficulty. Cranial Nerves: II: Discs flat bilaterally; Visual fields grossly normal, pupils equal, round, reactive to light and accommodation III,IV, VI: ptosis not present, extra-ocular motions intact bilaterally V,VII: smile symmetric, facial light touch sensation normal bilaterally VIII: hearing normal bilaterally IX,X: gag reflex present XI: bilateral shoulder shrug XII: midline tongue extension without atrophy or fasciculations  Motor: Right : Upper extremity   5/5    Left:     Upper extremity   5/5  Lower extremity   5/5     Lower extremity   5/5 Tone and bulk:normal tone throughout; no atrophy noted Sensory: Pinprick and light touch intact throughout, bilaterally Deep Tendon Reflexes:  Right: Upper Extremity   Left: Upper extremity   biceps (C-5 to C-6) 2/4   biceps (C-5 to C-6) 2/4 tricep (C7) 2/4    triceps (C7) 2/4 Brachioradialis (C6) 2/4  Brachioradialis (C6) 2/4  Lower Extremity Lower Extremity  quadriceps (L-2 to L-4) 2/4   quadriceps (L-2 to L-4) 2/4 Achilles (S1) 2/4   Achilles (S1) 2/4  Plantars: Right: downgoing   Left: downgoing Cerebellar: normal finger-to-nose,  normal heel-to-shin test Gait:  No ataxia. CV: pulses palpable throughout    No results found for this basename: cbc, bmp, coags, chol, tri, ldl, hga1c    Results for orders placed during the hospital encounter of 12/02/12 (from the past 48 hour(s))  GLUCOSE, CAPILLARY     Status: Abnormal   Collection Time    12/02/12  9:03 PM      Result Value Range    Glucose-Capillary 108 (*) 70 - 99 mg/dL   Comment 1 Notify RN    ETHANOL     Status: None   Collection Time    12/02/12  9:25 PM      Result Value Range   Alcohol, Ethyl (B) <11  0 - 11 mg/dL   Comment:            LOWEST DETECTABLE LIMIT FOR     SERUM ALCOHOL IS 11 mg/dL     FOR MEDICAL PURPOSES ONLY  PROTIME-INR     Status: None   Collection Time    12/02/12  9:25 PM      Result Value Range   Prothrombin Time 12.5  11.6 - 15.2 seconds   INR 0.95  0.00 - 1.49  APTT     Status: None   Collection Time    12/02/12  9:25 PM      Result Value Range   aPTT 27  24 - 37 seconds  CBC     Status: None   Collection Time    12/02/12  9:25 PM      Result Value Range   WBC 5.6  4.0 - 10.5 K/uL   RBC 5.02  3.87 - 5.11 MIL/uL   Hemoglobin 14.5  12.0 - 15.0 g/dL   HCT 40.9  81.1 - 91.4 %   MCV 81.7  78.0 - 100.0 fL   MCH 28.9  26.0 - 34.0 pg   MCHC 35.4  30.0 - 36.0 g/dL   RDW 78.2  95.6 - 21.3 %   Platelets 220  150 - 400 K/uL  DIFFERENTIAL     Status: Abnormal   Collection Time    12/02/12  9:25 PM      Result Value Range   Neutrophils Relative % 33 (*) 43 - 77 %   Neutro Abs 1.9  1.7 - 7.7 K/uL   Lymphocytes Relative 52 (*) 12 - 46 %   Lymphs Abs 2.9  0.7 - 4.0 K/uL   Monocytes Relative 8  3 - 12 %   Monocytes Absolute 0.5  0.1 - 1.0 K/uL   Eosinophils Relative 7 (*) 0 - 5 %   Eosinophils Absolute 0.4  0.0 - 0.7 K/uL   Basophils Relative 0  0 - 1 %   Basophils Absolute 0.0  0.0 - 0.1 K/uL  COMPREHENSIVE METABOLIC PANEL     Status: Abnormal   Collection Time    12/02/12  9:25 PM      Result Value Range   Sodium 135  135 - 145 mEq/L   Potassium 3.4 (*)  3.5 - 5.1 mEq/L   Chloride 99  96 - 112 mEq/L   CO2 23  19 - 32 mEq/L   Glucose, Bld 109 (*) 70 - 99 mg/dL   BUN 24 (*) 6 - 23 mg/dL   Creatinine, Ser 1.61  0.50 - 1.10 mg/dL   Calcium 09.6  8.4 - 04.5 mg/dL   Total Protein 8.6 (*) 6.0 - 8.3 g/dL   Albumin 4.1  3.5 - 5.2 g/dL   AST 29  0 - 37 U/L   ALT 24  0 - 35 U/L    Alkaline Phosphatase 100  39 - 117 U/L   Total Bilirubin 0.5  0.3 - 1.2 mg/dL   GFR calc non Af Amer 59 (*) >90 mL/min   GFR calc Af Amer 69 (*) >90 mL/min   Comment: (NOTE)     The eGFR has been calculated using the CKD EPI equation.     This calculation has not been validated in all clinical situations.     eGFR's persistently <90 mL/min signify possible Chronic Kidney     Disease.  URINE RAPID DRUG SCREEN (HOSP PERFORMED)     Status: None   Collection Time    12/02/12 10:18 PM      Result Value Range   Opiates NONE DETECTED  NONE DETECTED   Cocaine NONE DETECTED  NONE DETECTED   Benzodiazepines NONE DETECTED  NONE DETECTED   Amphetamines NONE DETECTED  NONE DETECTED   Tetrahydrocannabinol NONE DETECTED  NONE DETECTED   Barbiturates NONE DETECTED  NONE DETECTED   Comment:            DRUG SCREEN FOR MEDICAL PURPOSES     ONLY.  IF CONFIRMATION IS NEEDED     FOR ANY PURPOSE, NOTIFY LAB     WITHIN 5 DAYS.                LOWEST DETECTABLE LIMITS     FOR URINE DRUG SCREEN     Drug Class       Cutoff (ng/mL)     Amphetamine      1000     Barbiturate      200     Benzodiazepine   200     Tricyclics       300     Opiates          300     Cocaine          300     THC              50  URINALYSIS, ROUTINE W REFLEX MICROSCOPIC     Status: Abnormal   Collection Time    12/02/12 10:18 PM      Result Value Range   Color, Urine YELLOW  YELLOW   APPearance CLOUDY (*) CLEAR   Specific Gravity, Urine 1.018  1.005 - 1.030   pH 6.0  5.0 - 8.0   Glucose, UA NEGATIVE  NEGATIVE mg/dL   Hgb urine dipstick NEGATIVE  NEGATIVE   Bilirubin Urine NEGATIVE  NEGATIVE   Ketones, ur NEGATIVE  NEGATIVE mg/dL   Protein, ur NEGATIVE  NEGATIVE mg/dL   Urobilinogen, UA 1.0  0.0 - 1.0 mg/dL   Nitrite POSITIVE (*) NEGATIVE   Leukocytes, UA SMALL (*) NEGATIVE  URINE MICROSCOPIC-ADD ON     Status: Abnormal   Collection Time    12/02/12 10:18 PM      Result Value Range   Squamous Epithelial / LPF  RARE   RARE   WBC, UA 7-10  <3 WBC/hpf   Bacteria, UA MANY (*) RARE  POCT I-STAT TROPONIN I     Status: None   Collection Time    12/03/12 12:11 AM      Result Value Range   Troponin i, poc 0.00  0.00 - 0.08 ng/mL   Comment 3            Comment: Due to the release kinetics of cTnI,     a negative result within the first hours     of the onset of symptoms does not rule out     myocardial infarction with certainty.     If myocardial infarction is still suspected,     repeat the test at appropriate intervals.  POCT I-STAT, CHEM 8     Status: Abnormal   Collection Time    12/03/12 12:13 AM      Result Value Range   Sodium 140  135 - 145 mEq/L   Potassium 3.7  3.5 - 5.1 mEq/L   Chloride 103  96 - 112 mEq/L   BUN 26 (*) 6 - 23 mg/dL   Creatinine, Ser 3.08 (*) 0.50 - 1.10 mg/dL   Glucose, Bld 657 (*) 70 - 99 mg/dL   Calcium, Ion 8.46  9.62 - 1.23 mmol/L   TCO2 23  0 - 100 mmol/L   Hemoglobin 15.0  12.0 - 15.0 g/dL   HCT 95.2  84.1 - 32.4 %  GLUCOSE, CAPILLARY     Status: Abnormal   Collection Time    12/03/12  1:34 AM      Result Value Range   Glucose-Capillary 119 (*) 70 - 99 mg/dL    Ct Head Wo Contrast  12/02/2012   CLINICAL DATA:  left-sided weakness and slurred speech  EXAM:  HEAD CT WITHOUT CONTRAST  COMPARISON:  brain CT November 07, 2009 and brain MRI November 23, 2012  FINDINGS: The ventricles are normal in size and configuration. There is no mass, hemorrhage, extra-axial fluid collection, or midline shift. There is a punctate calcification in the left anterior parietal lobe which may represent a small granuloma. Elsewhere gray-white compartments are normal. There is no demonstrable acute infarct. Bony calvarium appears intact. Mastoid air cells are clear.  IMPRESSION: Punctate calcification in the anterior left parietal lobe consistent with a small granuloma. Study otherwise unremarkable.   Electronically Signed   By: Bretta Bang M.D.   On: 12/02/2012 21:50      Assessment/Plan: 57 Y/O with recent onset severe HA and new development of left arm paresthesias and heaviness that lasted for approximately 1 hour. Differential diagnosis include TIA versus complicated migraine. Complete TIA work up. Trial of IV Depacon and reglan for HA control. Continue aspirin. Will follow up.   Wyatt Portela, MD [facility] [phone]  12/03/2012, 6:48 AM

## 2012-12-03 NOTE — Progress Notes (Signed)
VASCULAR LAB PRELIMINARY  PRELIMINARY  PRELIMINARY  PRELIMINARY  Carotid duplex completed.    Preliminary report:  Right - 1% to 39% ICA stenosis. Vertebral artery flow is antegrade. Left - 80% to 99% ICA stenosis. Vertebral artery flow is antegrade  Eavan Gonterman, RVS 12/03/2012, 10:55 AM

## 2012-12-03 NOTE — Progress Notes (Signed)
  Echocardiogram 2D Echocardiogram has been performed.  [name] 12/03/2012, 10:01 AM

## 2012-12-04 DIAGNOSIS — I6529 Occlusion and stenosis of unspecified carotid artery: Secondary | ICD-10-CM

## 2012-12-04 LAB — URINE CULTURE

## 2012-12-04 LAB — GLUCOSE, CAPILLARY
Glucose-Capillary: 142 mg/dL — ABNORMAL HIGH (ref 70–99)
Glucose-Capillary: 165 mg/dL — ABNORMAL HIGH (ref 70–99)
Glucose-Capillary: 124 mg/dL — ABNORMAL HIGH (ref 70–99)

## 2012-12-04 MED ORDER — LISINOPRIL 20 MG PO TABS: 20.0000 mg | ORAL_TABLET | Freq: Every day | ORAL | Status: DC

## 2012-12-04 MED ORDER — STROKE: EARLY STAGES OF RECOVERY BOOK
Freq: Once | Status: AC
  Administered 2012-12-04: 13:00:00
  Filled 2012-12-04: qty 1

## 2012-12-04 MED ORDER — ASPIRIN 325 MG PO TBEC: 325.0000 mg | DELAYED_RELEASE_TABLET | Freq: Every day | ORAL | Status: DC

## 2012-12-04 NOTE — Evaluation (Signed)
Physical Therapy Evaluation Patient Details Name: [name] MRN: [id number] DOB: 1955/04/17 Today's Date: 12/04/2012 Time: 1007-1020 PT Time Calculation (min): 13 min  PT Assessment / Plan / Recommendation History of Present Illness  57 year old female presents with transient slurred speech and left arm and leg numbness. She did have a headache for 2 weeks and had a normal MRI of one week ago. The headache is continued to get gradually worse since then. She is talking to her doctor about on the phone was having some slurred speech about 7 or 8 hours ago. She states this lasts about 30 minutes. Doctor's office and call her back later advised to go to the ER to be evaluated for a stroke. She states that since she was in triage (about 3 hours prior to me seeing patient) she started having left hand and left foot numbness. There is no associated weakness. She has no blurry vision. The pain is a 10 out of 10 is like someone is hitting her in the head.   MRI: New deep white matter infarct affecting the right centrum semiovale, 2 mm in size not present on the previous study from 11/23/2012.   Clinical Impression  Pt admitted with above. Pt currently with functional limitations due to the deficits listed below (see PT Problem List).  Pt will benefit from skilled PT to increase their independence and safety with mobility to allow discharge to the venue listed below.  Pt appears close to her baseline.  Will continue to follow in acute for safe mobilization however no f/u PT recommended.  Also, pt reporting increased SOB with activity lately such as stairs at home and swelling in her feet occasionally and recommended pt discuss these new symptoms with MD here or PCP.     PT Assessment  Patient needs continued PT services    Follow Up Recommendations  No PT follow up    Does the patient have the potential to tolerate intense rehabilitation      Barriers to Discharge        Equipment  Recommendations  None recommended by PT    Recommendations for Other Services     Frequency Min 3X/week    Precautions / Restrictions Precautions Precautions: None   Pertinent Vitals/Pain See mobility section below, pt reports SOB with ambulation      Mobility  Bed Mobility Bed Mobility: Supine to Sit Supine to Sit: 7: Independent Transfers Transfers: Sit to Stand;Stand to Sit Sit to Stand: 4: Min guard;From bed Stand to Sit: 4: Min guard;To bed Details for Transfer Assistance: min/guard due to report of dizziness upon standing which pt states has been occurring since in hospital however quickly resolves Ambulation/Gait Ambulation/Gait Assistance: 4: Min guard Ambulation Distance (Feet): 200 Feet Assistive device: 1 person hand held assist Ambulation/Gait Assistance Details: pt initially assisted with 1 HHA due to "woobly" LEs however progressed to grazing hand rail, no LOB observed Gait Pattern: Step-through pattern;Wide base of support (bil toe out present) Gait velocity: decreased General Gait Details: pt reported dyspnea upon return to room: SaO2 99% room air and HR 63 bpm, pt states she has become SOB doing stairs at home and other activities more recently, she also states her LEs will swell occasionally, recommended she discuss with her MD or PCP Modified Rankin (Stroke Patients Only) Pre-Morbid Rankin Score: No symptoms Modified Rankin: Moderately severe disability    Exercises     PT Diagnosis: Difficulty walking  PT Problem List: Decreased activity tolerance;Decreased mobility PT Treatment  Interventions: DME instruction;Gait training;Stair training;Functional mobility training;Therapeutic activities;Therapeutic exercise;Patient/family education     PT Goals(Current goals can be found in the care plan section) Acute Rehab PT Goals PT Goal Formulation: With patient Time For Goal Achievement: 12/11/12 Potential to Achieve Goals: Good  Visit Information  Last PT  Received On: 12/04/12 Assistance Needed: +1 History of Present Illness: 57 year old female presents with transient slurred speech and left arm and leg numbness. She did have a headache for 2 weeks and had a normal MRI of one week ago. The headache is continued to get gradually worse since then. She is talking to her doctor about on the phone was having some slurred speech about 7 or 8 hours ago. She states this lasts about 30 minutes. Doctor's office and call her back later advised to go to the ER to be evaluated for a stroke. She states that since she was in triage (about 3 hours prior to me seeing patient) she started having left hand and left foot numbness. There is no associated weakness. She has no blurry vision. The pain is a 10 out of 10 is like someone is hitting her in the head.   MRI: New deep white matter infarct affecting the right centrum semiovale, 2 mm in size not present on the previous study from 11/23/2012.        Prior Functioning  Home Living Family/patient expects to be discharged to:: Private residence Living Arrangements: Children;Other relatives Available Help at Discharge: Family;Available 24 hours/day Type of Home: Apartment Home Layout: Two level;Bed/bath upstairs Alternate Level Stairs-Number of Steps: flight Alternate Level Stairs-Rails: Right Home Equipment: None Prior Function Level of Independence: Independent Communication Communication: No difficulties    Cognition  Cognition Arousal/Alertness: Awake/alert Behavior During Therapy: WFL for tasks assessed/performed Overall Cognitive Status: Within Functional Limits for tasks assessed    Extremity/Trunk Assessment Lower Extremity Assessment Lower Extremity Assessment: Overall WFL for tasks assessed (pt reports "bad knees")   Balance    End of Session PT - End of Session Activity Tolerance: Patient tolerated treatment well Patient left: in chair;with call bell/phone within reach  GP Functional Assessment  Tool Used: clinical judgement Functional Limitation: Mobility: Walking and moving around Mobility: Walking and Moving Around Current Status ([id number]): At least 1 percent but less than 20 percent impaired, limited or restricted Mobility: Walking and Moving Around Goal Status [phone]): 0 percent impaired, limited or restricted   [name],KATHrine E 12/04/2012, 10:47 AM Zenovia Jarred, PT, DPT 12/04/2012 Pager: [phone]

## 2012-12-04 NOTE — Discharge Summary (Signed)
Physician Discharge Summary  [name]:[id number] DOB: 1955-10-25 DOA: 12/02/2012  PCP: Emeterio Reeve, MD  Admit date: 12/02/2012 Discharge date: 12/04/2012  Time spent: >22minutes  Recommendations for Outpatient Follow-up:  Follow-up Information   Follow up with Emeterio Reeve, MD. (in 1-2weeks, call for appt upon discharge)    Specialty:  Family Medicine   Contact information:   [address] [place] [address] [phone]       Please follow up. ( Follow up with out patient neurology in 1-2 months. [facility] neurolog[facility] [address] [place], [place], [place] [address] [phone] or [phone])       Follow up with Jorge Ny, MD. (his office to contact you with appt date )    Specialty:  Vascular Surgery   Contact information:   [address] [place] [address] [phone]       Discharge Diagnoses:  Principal Problem:   TIA (transient ischemic attack) Active Problems:   Diabetes   CAD (coronary artery disease)   Obesity (BMI 30-39.9)   Hypertensive urgency   Headache   Discharge Condition: improved/stable  Diet recommendation: low sodium heart healthy  Filed Weights   12/03/12 0039  Weight: 99.701 kg (219 lb 12.8 oz)    History of present illness:  57 year old obese female with history of hypertension, diabetes mellitus on insulin, hyperlipidemia who presented to the ED with tingling and on his upper left extremity lasting for several minutes today. For past 2 weeks she has been having throbbing frontal headaches lasting throughout the day without any aggravating or relieving symptoms. She also has been noticing her blood pressure to be quite high since the same time in the range of 160-190/ 90-116 mmHg). She had come to the ED one week back with ongoing headache when she had an MRI of brain done which was unremarkable Patient reports taking her medications regularly. She denies any blurred vision, dizziness, nausea or  vomiting. Denies any chest pain, palpitations, shortness of breath, abdominal pain, bowel or urinary symptoms. On the  Afternoon of admission she noticed tingling and numbness of her left upper and lower extremities lasting for few minutes and she called her PCP. It was while talking to her PCP she also noticed some slurring speech. The doctor on call told her to come to the ED. She denied history of migraine. She did report some visual discomfort to bright light on admission. She denies any change in her weight or appetite   Hospital Course:  Probable acute infarct- R.Centrum ovale infarct  -Upon admission pt had CT of head done and it was neg for acute infarct. Neuro was consulted and the initial impression was that it was TIA vs complicated migraine. She was started on depacon and reglan for headaches. An MRI was done and showed New deep white matter infarct affecting the right centrum semiovale,  2 mm in size not present on the previous study from 11/23/2012>>discussed with neuro/Dr Kirkpatrick>> small and not caused by the L. ICA stenosis since it is contralateral. She had carotid dopplers which showed nl EF, and grade 1diastolic dysfunction was noted.She was placed on  ASA and was instructed to take daily upon d/c and follow up outpt, consulted PT/OT>> no skilled needs.  -L. ICA Stenosis -Pt had carotid dopplers done>>see results below. This was discussed with neuro, and I consulted vasular/Dr Brabham via phone and he recommended for pt  to follow up with him outpt   Headaches  -likely  d/t #1, resolved HTN  -Continue atenelol, lisinopril was added to improve BP control. She is to follow up with PCP outpt DM, uncontrolled  -Continue levemir and ssi, follow  Hypokalemia  -resolved   Procedures:  Carotid Dopplers Summary:  - Right - 1% to 39% ICA stenosis - Left - 80% to 99% proximal ICA stenosis - Bilateral - Vertebral artery flow is antegrade.   echo Study Conclusions  - Left  ventricle: The cavity size was normal. Wall thickness was increased in a pattern of mild LVH. Systolic function was normal. The estimated ejection fraction was in the range of 60% to 65%. Wall motion was normal; there were no regional wall motion abnormalities. Doppler parameters are consistent with abnormal left ventricular relaxation (grade 1 diastolic dysfunction).   Consultations:  Neuro  Vascular-via phone>> Pt to see Dr Myra Gianotti outpt  Discharge Exam: Filed Vitals:   12/04/12 1330  BP: 156/75  Pulse: 61  Temp: 98.3 F (36.8 C)  Resp: 16   Exam:  General: alert & oriented x 3 In NAD  Cardiovascular: RRR, nl S1 s2  Respiratory: CTAB  Abdomen: soft +BS NT/ND, no masses palpable  Extremities: No cyanosis and no edema  Neuro- strength nl, symmetric    Discharge Instructions  Discharge Orders   Future Orders Complete By Expires   Diet Carb Modified  As directed    Increase activity slowly  As directed        Medication List         aspirin 325 MG EC tablet  Take 1 tablet (325 mg total) by mouth daily.     atenolol 100 MG tablet  Commonly known as:  TENORMIN  Take 100 mg by mouth daily.     gabapentin 300 MG capsule  Commonly known as:  NEURONTIN  Take 300 mg by mouth 2 (two) times daily.     insulin NPH 100 UNIT/ML injection  Commonly known as:  HUMULIN N,NOVOLIN N  Inject 20 Units into the skin.     lisinopril 20 MG tablet  Commonly known as:  PRINIVIL  Take 1 tablet (20 mg total) by mouth daily.     metFORMIN 500 MG tablet  Commonly known as:  GLUCOPHAGE  Take 500 mg by mouth 2 (two) times daily with a meal.     potassium chloride 10 MEQ tablet  Commonly known as:  K-DUR  Take 10 mEq by mouth daily.     rosuvastatin 40 MG tablet  Commonly known as:  CRESTOR  Take 40 mg by mouth daily.       Allergies  Allergen Reactions  . Cymbalta [Duloxetine Hcl] Other (See Comments)    dizzy  . Penicillins Other (See Comments)    unknown  .  Tramadol     unknown  . Trazodone And Nefazodone       The results of significant diagnostics from this hospitalization (including imaging, microbiology, ancillary and laboratory) are listed below for reference.    Significant Diagnostic Studies: Ct Head Wo Contrast  12/02/2012   CLINICAL DATA:  left-sided weakness and slurred speech  EXAM:  HEAD CT WITHOUT CONTRAST  COMPARISON:  brain CT November 07, 2009 and brain MRI November 23, 2012  FINDINGS: The ventricles are normal in size and configuration. There is no mass, hemorrhage, extra-axial fluid collection, or midline shift. There is a punctate calcification in the left anterior parietal lobe which may represent a small granuloma. Elsewhere gray-white compartments are normal. There is no demonstrable acute  infarct. Bony calvarium appears intact. Mastoid air cells are clear.  IMPRESSION: Punctate calcification in the anterior left parietal lobe consistent with a small granuloma. Study otherwise unremarkable.   Electronically Signed   By: Bretta Bang M.D.   On: 12/02/2012 21:50   Mr Maxine Glenn Head Wo Contrast  12/03/2012   CLINICAL DATA:  New onset of Left arm paresthesias and left-sided weakness. Two week history of headache. Stroke risk factors include hypertension and diabetes as well as coronary artery disease and hyperlipidemia.  EXAM: MRI HEAD WITHOUT CONTRAST  MRA HEAD WITHOUT CONTRAST  TECHNIQUE: Multiplanar, multiecho pulse sequences of the brain and surrounding structures were obtained without intravenous contrast. Angiographic images of the head were obtained using MRA technique without contrast.  COMPARISON:  MRI brain 11/23/2012.  CT head 12/02/2012.  FINDINGS: MRI HEAD FINDINGS  2 mm area of restricted diffusion affects the right centrum semiovale, not present on 11/23/12 and represents an acute deep white matter infarct.  No significant cerebral or cerebellar atrophy. Abnormal for age areas of subcortical and periventricular white matter  signal abnormality representing early small vessel disease.  No foci of acute or chronic hemorrhage. No mass lesion or hydrocephalus. Negative osseous structures. Scalp soft tissues, orbits, sinuses, and mastoids unremarkable.  MRA HEAD FINDINGS  Diminished flow related enhancement in the petrous, cavernous, and proximal supraclinoid internal carotid arteries with estimated 75% stenosis cavernous and supra clinoid segments. 3 mm outpouching laterally from the petrous LICA likely atheromatous in nature. No definite carotid dissection. No right ICA narrowing.  Mild non stenotic irregularity of the basilar artery in its midportion. Both vertebrals contribute to its formation. Distal right vertebral stenosis just above PICA origin approximately 75%.  No proximal right anterior or middle cerebral artery stenosis. 75% stenosis at the origin of the left middle cerebral artery M1 segment. Left A1 ACA widely patent. Mild irregularity bilateral distal MCA branches.  No proximal posterior cerebral artery stenosis. Mild irregularity distal PCA segments bilaterally.  No cerebellar branch occlusion. No visible intracranial berry aneurysm.  IMPRESSION: New deep white matter infarct affecting the right centrum semiovale, 2 mm in size not present on the previous study from 11/23/2012.  Abnormal for age chronic microvascular ischemic change.  Potentially significant flow reducing tandem lesions affecting the petrous, cavernous, and supraclinoid left internal carotid artery; estimated 75% diffuse LICA and left M1 MCA stenoses. 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   Electronically Signed   By: Davonna Belling M.D.   On: 12/03/2012 11:28   Mri Brain Without Contrast  12/03/2012   CLINICAL DATA:  New onset of Left arm paresthesias and left-sided weakness. Two week history of headache. Stroke risk factors include hypertension and diabetes as well as coronary artery disease and  hyperlipidemia.  EXAM: MRI HEAD WITHOUT CONTRAST  MRA HEAD WITHOUT CONTRAST  TECHNIQUE: Multiplanar, multiecho pulse sequences of the brain and surrounding structures were obtained without intravenous contrast. Angiographic images of the head were obtained using MRA technique without contrast.  COMPARISON:  MRI brain 11/23/2012.  CT head 12/02/2012.  FINDINGS: MRI HEAD FINDINGS  2 mm area of restricted diffusion affects the right centrum semiovale, not present on 11/23/12 and represents an acute deep white matter infarct.  No significant cerebral or cerebellar atrophy. Abnormal for age areas of subcortical and periventricular white matter signal abnormality representing early small vessel disease.  No foci of acute or chronic hemorrhage. No mass lesion or hydrocephalus. Negative osseous structures. Scalp soft tissues,  orbits, sinuses, and mastoids unremarkable.  MRA HEAD FINDINGS  Diminished flow related enhancement in the petrous, cavernous, and proximal supraclinoid internal carotid arteries with estimated 75% stenosis cavernous and supra clinoid segments. 3 mm outpouching laterally from the petrous LICA likely atheromatous in nature. No definite carotid dissection. No right ICA narrowing.  Mild non stenotic irregularity of the basilar artery in its midportion. Both vertebrals contribute to its formation. Distal right vertebral stenosis just above PICA origin approximately 75%.  No proximal right anterior or middle cerebral artery stenosis. 75% stenosis at the origin of the left middle cerebral artery M1 segment. Left A1 ACA widely patent. Mild irregularity bilateral distal MCA branches.  No proximal posterior cerebral artery stenosis. Mild irregularity distal PCA segments bilaterally.  No cerebellar branch occlusion. No visible intracranial berry aneurysm.  IMPRESSION: New deep white matter infarct affecting the right centrum semiovale, 2 mm in size not present on the previous study from 11/23/2012.  Abnormal for  age chronic microvascular ischemic change.  Potentially significant flow reducing tandem lesions affecting the petrous, cavernous, and supraclinoid left internal carotid artery; estimated 75% diffuse LICA and left M1 MCA stenoses. 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   Electronically Signed   By: Davonna Belling M.D.   On: 12/03/2012 11:28   Mr Brain Wo Contrast  11/23/2012   CLINICAL DATA:  57 year old female with headache, hearing loss, ear pain. Initial encounter.  EXAM: MRI HEAD WITHOUT CONTRAST  TECHNIQUE: Multiplanar, multisequence MR imaging was performed. No intravenous contrast was administered.  COMPARISON:  Head CTs 11/07/2009 and earlier.  FINDINGS: Stable and normal for age cerebral volume. No restricted diffusion to suggest acute infarction. No midline shift, mass effect, evidence of mass lesion, ventriculomegaly, extra-axial collection or acute intracranial hemorrhage. Cervicomedullary junction and pituitary are within normal limits. Negative visualized cervical spine. Major intracranial vascular flow voids are preserved.  Mild for age scattered small foci cerebral white matter T2 and FLAIR hyperintensity, in a nonspecific configuration. No cortical encephalomalacia. Deep gray matter nuclei, brainstem and cerebellum are within normal limits.  Visible internal auditory structures appear normal. Mastoids and tympanic cavities are clear. Normal visualized parotid glands.  Paranasal sinuses are clear. Orbit soft tissues are within normal limits. Visualized scalp soft tissues are within normal limits. Normal bone marrow signal.  IMPRESSION: 1. No acute intracranial abnormality.  2. Mild for age nonspecific cerebral white matter signal changes, most commonly due to chronic small vessel disease.   Electronically Signed   By: Augusto Gamble M.D.   On: 11/23/2012 19:20    Microbiology: Recent Results (from the past 240 hour(s))  URINE CULTURE     Status:  None   Collection Time    12/02/12 10:18 PM      Result Value Range Status   Specimen Description URINE, CLEAN CATCH   Final   Special Requests NONE   Final   Culture  Setup Time     Final   Value: 12/03/2012 03:26     Performed at [facility] Count     Final   Value: >=100,000 COLONIES/ML     Performed at [facility]   Culture     Final   Value: ESCHERICHIA COLI     Performed at [facility]   Report Status 12/04/2012 FINAL   Final   Organism ID, Bacteria ESCHERICHIA COLI   Final     Labs: Basic Metabolic Panel:  Recent Labs  Lab 12/02/12 2125 12/03/12 0013  NA 135 140  K 3.4* 3.7  CL 99 103  CO2 23  --   GLUCOSE 109* 118*  BUN 24* 26*  CREATININE 1.02 1.20*  CALCIUM 10.2  --    Liver Function Tests:  Recent Labs Lab 12/02/12 2125  AST 29  ALT 24  ALKPHOS 100  BILITOT 0.5  PROT 8.6*  ALBUMIN 4.1   No results found for this basename: LIPASE, AMYLASE,  in the last 168 hours No results found for this basename: AMMONIA,  in the last 168 hours CBC:  Recent Labs Lab 12/02/12 2125 12/03/12 0013  WBC 5.6  --   NEUTROABS 1.9  --   HGB 14.5 15.0  HCT 41.0 44.0  MCV 81.7  --   PLT 220  --    Cardiac Enzymes: No results found for this basename: CKTOTAL, CKMB, CKMBINDEX, TROPONINI,  in the last 168 hours BNP: BNP (last 3 results) No results found for this basename: PROBNP,  in the last 8760 hours CBG:  Recent Labs Lab 12/03/12 1133 12/03/12 1657 12/03/12 2135 12/04/12 0731 12/04/12 1215  GLUCAP 150* 102* 142* 165* 124*       Signed:  Amila Callies C  [facility] 12/04/2012, 4:25 PM

## 2012-12-04 NOTE — Evaluation (Signed)
Occupational Therapy Evaluation Patient Details Name: [name] MRN: [id number] DOB: 1955-04-22 Today's Date: 12/04/2012 Time: 1191-4782 OT Time Calculation (min): 21 min  OT Assessment / Plan / Recommendation History of present illness 57 year old female presents with transient slurred speech and left arm and leg numbness. She did have a headache for 2 weeks and had a normal MRI of one week ago. The headache is continued to get gradually worse since then. She is talking to her doctor about on the phone was having some slurred speech about 7 or 8 hours ago. She states this lasts about 30 minutes. Doctor's office and call her back later advised to go to the ER to be evaluated for a stroke. She states that since she was in triage (about 3 hours prior to me seeing patient) she started having left hand and left foot numbness. There is no associated weakness. She has no blurry vision. The pain is a 10 out of 10 is like someone is hitting her in the head.   MRI: New deep white matter infarct affecting the right centrum semiovale, 2 mm in size not present on the previous study from 11/23/2012.    Clinical Impression   Pt presents to OT with problems listed below. Pt will benefit from skilled OT to increase I with ADL activity and return to PLOF.    OT Assessment  Patient needs continued OT Services    Follow Up Recommendations  Supervision - Intermittent;No OT follow up       Equipment Recommendations  None recommended by OT       Frequency  Min 2X/week    Precautions / Restrictions Precautions Precautions: None       ADL  Grooming: Supervision/safety Where Assessed - Grooming: Unsupported standing Upper Body Bathing: Set up Where Assessed - Upper Body Bathing: Unsupported sitting Lower Body Bathing: Min guard Where Assessed - Lower Body Bathing: Unsupported sit to stand Upper Body Dressing: Set up Where Assessed - Upper Body Dressing: Unsupported sitting Lower Body Dressing: Min  guard Where Assessed - Lower Body Dressing: Supported sit to stand Toilet Transfer: Hydrographic surveyor Method: Sit to Barista: Regular height toilet Toileting - Architect and Hygiene: Min guard Where Assessed - Engineer, mining and Hygiene: Standing Tub/Shower Transfer: Insurance risk surveyor Method: Ambulating    OT Diagnosis: Generalized weakness  OT Problem List: Decreased activity tolerance OT Treatment Interventions: Self-care/ADL training;Patient/family education   OT Goals(Current goals can be found in the care plan section) Acute Rehab OT Goals Patient Stated Goal: 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, mining use all my vacation OT Goal Formulation: With patient Time For Goal Achievement: 12/04/12 Potential to Achieve Goals: Good ADL Goals Pt Will Perform Grooming: Independently;standing Pt Will Perform Upper Body Dressing: Independently;standing Pt Will Perform Lower Body Dressing: Independently;sit to/from stand Pt Will Transfer to Toilet: Independently;ambulating;regular height toilet Pt Will Perform Toileting - Clothing Manipulation and hygiene: Independently;sit to/from stand Pt Will Perform Tub/Shower Transfer: Independently  Visit Information  Last OT Received On: 12/04/12 Assistance Needed: +1 History of Present Illness: 57 year old female presents with transient slurred speech and left arm and leg numbness. She did have a headache for 2 weeks and had a normal MRI of one week ago. The headache is continued to get gradually worse since then. She is talking to her doctor about on the phone was having some slurred speech about 7 or 8 hours ago. She states this lasts about 30 minutes. Doctor's  office and call her back later advised to go to the ER to be evaluated for a stroke. She states that since she was in triage (about 3 hours prior to me seeing patient) she started having left hand and left foot numbness. There is no  associated weakness. She has no blurry vision. The pain is a 10 out of 10 is like someone is hitting her in the head.   MRI: New deep white matter infarct affecting the right centrum semiovale, 2 mm in size not present on the previous study from 11/23/2012.        Prior Functioning     Home Living Family/patient expects to be discharged to:: Private residence Living Arrangements: Children;Other relatives Available Help at Discharge: Family;Available 24 hours/day Type of Home: Apartment Home Layout: Two level;Bed/bath upstairs Alternate Level Stairs-Number of Steps: flight Alternate Level Stairs-Rails: Right Home Equipment: None Prior Function Level of Independence: Independent Communication Communication: No difficulties         Vision/Perception Vision - History Patient Visual Report: No change from baseline   Cognition  Cognition Arousal/Alertness: Awake/alert Behavior During Therapy: WFL for tasks assessed/performed Overall Cognitive Status: Within Functional Limits for tasks assessed    Extremity/Trunk Assessment Upper Extremity Assessment Upper Extremity Assessment: Overall WFL for tasks assessed (pt did report tingling  in L hand at times- not upon eval) Lower Extremity Assessment Lower Extremity Assessment: Overall WFL for tasks assessed (pt reports "bad knees")     Mobility Bed Mobility Bed Mobility: Supine to Sit Supine to Sit: 7: Independent;5: Supervision Transfers Transfers: Sit to Stand;Stand to Sit Sit to Stand: From bed;From chair/3-in-1;From toilet;5: Supervision;With upper extremity assist Stand to Sit: To toilet;To bed;5: Supervision;With upper extremity assist Details for Transfer Assistance: no dizziness reported           End of Session OT - End of Session Activity Tolerance: Patient tolerated treatment well Patient left: in bed;with call bell/phone within reach  GO Functional Assessment Tool Used: clinical observation Functional  Limitation: Self care Self Care Current Status (Z6109): At least 1 percent but less than 20 percent impaired, limited or restricted Self Care Goal Status (U0454): 0 percent impaired, limited or restricted   [name], [name] 12/04/2012, 11:28 AM

## 2012-12-04 NOTE — Progress Notes (Addendum)
NEURO HOSPITALIST PROGRESS NOTE   SUBJECTIVE:                                                                                                                         Patient walking in hallway with PT without problems.  States she has some residual left hand tingling but this comes and goes.  No other complaints. OBJECTIVE:                                                                                                                           Vital signs in last 24 hours: Temp:  [97.6 F (36.4 C)-98.3 F (36.8 C)] 97.6 F (36.4 C) (10/30 0555) Pulse Rate:  [52-60] 52 (10/30 0555) Resp:  [16-18] 18 (10/30 0555) BP: (133-161)/(52-85) 133/52 mmHg (10/30 0555) SpO2:  [97 %-100 %] 100 % (10/30 0555)  Intake/Output from previous day: 10/29 0701 - 10/30 0700 In: 420 [P.O.:360; IV Piggyback:60] Out: 1100 [Urine:1100] Intake/Output this shift: Total I/O In: 220 [P.O.:220] Out: -  Nutritional status: Carb Control  Past Medical History  Diagnosis Date  . Diabetes mellitus   . Hypertension   . Myocardial infarction 2000  . Chronic pain following surgery or procedure 2008    Abdominal pain     Neurologic Exam:  Mental Status: Alert, oriented, thought content appropriate.  Speech fluent without evidence of aphasia.  Able to follow 3 step commands without difficulty. Cranial Nerves: II: Discs flat bilaterally; Visual fields grossly normal, pupils equal, round, reactive to light and accommodation III,IV, VI: ptosis not present, extra-ocular motions intact bilaterally V,VII: smile symmetric, facial light touch sensation normal bilaterally VIII: hearing normal bilaterally IX,X: gag reflex present XI: bilateral shoulder shrug XII: midline tongue extension without atrophy or fasciculations  Motor: Right : Upper extremity   5/5    Left:     Upper extremity   5/5  Lower extremity   5/5     Lower extremity   5/5 Tone and bulk:normal tone  throughout; no atrophy noted Sensory: Pinprick and light touch intact throughout, bilaterally Deep Tendon Reflexes:  Right: Upper Extremity   Left: Upper extremity   biceps (C-5 to C-6) 2/4   biceps (C-5 to C-6) 2/4 tricep (C7) 2/4  triceps (C7) 2/4 Brachioradialis (C6) 2/4  Brachioradialis (C6) 2/4  Lower Extremity Lower Extremity  quadriceps (L-2 to L-4) 2/4   quadriceps (L-2 to L-4) 2/4 Achilles (S1) 2/4   Achilles (S1) 2/4  Plantars: Right: downgoing   Left: downgoing Cerebellar: normal finger-to-nose,  normal heel-to-shin test CV: pulses palpable throughout    Lab Results: Lab Results  Component Value Date/Time   CHOL 236* 12/03/2012 12:05 AM   Lipid Panel  Recent Labs  12/03/12 0005  CHOL 236*  TRIG 176*  HDL 58  CHOLHDL 4.1  VLDL 35  LDLCALC 960*    Studies/Results: Ct Head Wo Contrast  12/02/2012   CLINICAL DATA:  left-sided weakness and slurred speech  EXAM:  HEAD CT WITHOUT CONTRAST  COMPARISON:  brain CT November 07, 2009 and brain MRI November 23, 2012  FINDINGS: The ventricles are normal in size and configuration. There is no mass, hemorrhage, extra-axial fluid collection, or midline shift. There is a punctate calcification in the left anterior parietal lobe which may represent a small granuloma. Elsewhere gray-white compartments are normal. There is no demonstrable acute infarct. Bony calvarium appears intact. Mastoid air cells are clear.  IMPRESSION: Punctate calcification in the anterior left parietal lobe consistent with a small granuloma. Study otherwise unremarkable.   Electronically Signed   By: Bretta Bang M.D.   On: 12/02/2012 21:50   Mr Maxine Glenn Head Wo Contrast  12/03/2012   CLINICAL DATA:  New onset of Left arm paresthesias and left-sided weakness. Two week history of headache. Stroke risk factors include hypertension and diabetes as well as coronary artery disease and hyperlipidemia.  EXAM: MRI HEAD WITHOUT CONTRAST  MRA HEAD WITHOUT CONTRAST   TECHNIQUE: Multiplanar, multiecho pulse sequences of the brain and surrounding structures were obtained without intravenous contrast. Angiographic images of the head were obtained using MRA technique without contrast.  COMPARISON:  MRI brain 11/23/2012.  CT head 12/02/2012.  FINDINGS: MRI HEAD FINDINGS  2 mm area of restricted diffusion affects the right centrum semiovale, not present on 11/23/12 and represents an acute deep white matter infarct.  No significant cerebral or cerebellar atrophy. Abnormal for age areas of subcortical and periventricular white matter signal abnormality representing early small vessel disease.  No foci of acute or chronic hemorrhage. No mass lesion or hydrocephalus. Negative osseous structures. Scalp soft tissues, orbits, sinuses, and mastoids unremarkable.  MRA HEAD FINDINGS  Diminished flow related enhancement in the petrous, cavernous, and proximal supraclinoid internal carotid arteries with estimated 75% stenosis cavernous and supra clinoid segments. 3 mm outpouching laterally from the petrous LICA likely atheromatous in nature. No definite carotid dissection. No right ICA narrowing.  Mild non stenotic irregularity of the basilar artery in its midportion. Both vertebrals contribute to its formation. Distal right vertebral stenosis just above PICA origin approximately 75%.  No proximal right anterior or middle cerebral artery stenosis. 75% stenosis at the origin of the left middle cerebral artery M1 segment. Left A1 ACA widely patent. Mild irregularity bilateral distal MCA branches.  No proximal posterior cerebral artery stenosis. Mild irregularity distal PCA segments bilaterally.  No cerebellar branch occlusion. No visible intracranial berry aneurysm.  IMPRESSION: New deep white matter infarct affecting the right centrum semiovale, 2 mm in size not present on the previous study from 11/23/2012.  Abnormal for age chronic microvascular ischemic change.  Potentially significant flow  reducing tandem lesions affecting the petrous, cavernous, and supraclinoid left internal carotid artery; estimated 75% diffuse LICA and left M1 MCA stenoses. 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   Electronically Signed   By: Davonna Belling M.D.   On: 12/03/2012 11:28   Mri Brain Without Contrast  12/03/2012   CLINICAL DATA:  New onset of Left arm paresthesias and left-sided weakness. Two week history of headache. Stroke risk factors include hypertension and diabetes as well as coronary artery disease and hyperlipidemia.  EXAM: MRI HEAD WITHOUT CONTRAST  MRA HEAD WITHOUT CONTRAST  TECHNIQUE: Multiplanar, multiecho pulse sequences of the brain and surrounding structures were obtained without intravenous contrast. Angiographic images of the head were obtained using MRA technique without contrast.  COMPARISON:  MRI brain 11/23/2012.  CT head 12/02/2012.  FINDINGS: MRI HEAD FINDINGS  2 mm area of restricted diffusion affects the right centrum semiovale, not present on 11/23/12 and represents an acute deep white matter infarct.  No significant cerebral or cerebellar atrophy. Abnormal for age areas of subcortical and periventricular white matter signal abnormality representing early small vessel disease.  No foci of acute or chronic hemorrhage. No mass lesion or hydrocephalus. Negative osseous structures. Scalp soft tissues, orbits, sinuses, and mastoids unremarkable.  MRA HEAD FINDINGS  Diminished flow related enhancement in the petrous, cavernous, and proximal supraclinoid internal carotid arteries with estimated 75% stenosis cavernous and supra clinoid segments. 3 mm outpouching laterally from the petrous LICA likely atheromatous in nature. No definite carotid dissection. No right ICA narrowing.  Mild non stenotic irregularity of the basilar artery in its midportion. Both vertebrals contribute to its formation. Distal right vertebral stenosis just above PICA origin  approximately 75%.  No proximal right anterior or middle cerebral artery stenosis. 75% stenosis at the origin of the left middle cerebral artery M1 segment. Left A1 ACA widely patent. Mild irregularity bilateral distal MCA branches.  No proximal posterior cerebral artery stenosis. Mild irregularity distal PCA segments bilaterally.  No cerebellar branch occlusion. No visible intracranial berry aneurysm.  IMPRESSION: New deep white matter infarct affecting the right centrum semiovale, 2 mm in size not present on the previous study from 11/23/2012.  Abnormal for age chronic microvascular ischemic change.  Potentially significant flow reducing tandem lesions affecting the petrous, cavernous, and supraclinoid left internal carotid artery; estimated 75% diffuse LICA and left M1 MCA stenoses. 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   Electronically Signed   By: Davonna Belling M.D.   On: 12/03/2012 11:28    MEDICATIONS                                                                                                                        Scheduled: . aspirin EC  325 mg Oral Daily  . atenolol  100 mg Oral Daily  . atorvastatin  40 mg Oral q1800  . enoxaparin (LOVENOX) injection  40 mg Subcutaneous Daily  . gabapentin  300 mg Oral BID  . insulin aspart  0-15 Units Subcutaneous TID WC  . insulin detemir  20 Units Subcutaneous Daily  .  potassium chloride  10 mEq Oral Daily  . sodium chloride  3 mL Intravenous Q12H   Echo : ejection fraction was in the range of 60% to 65%. Wall motion was normal; there were no regional wall motion abnormalities.   Carotid doppler: Carotid duplex completed.  Preliminary report: Right - 1% to 39% ICA stenosis Vertebral artery flow is antegrade. Left - 80% to 99% ICA stenosis. Vertebral artery flow is antegrade.  ASSESSMENT/PLAN:                                                                                                             New right right centrum semiovale punctate infarct in the setting of UTI. Patient feeling improved and walking hallway with PT without difficulty.   1) Continue ASA daily 2) Continue Statin daily 3) Follow up left ICA stenosis as out patient as currently it is asymptomatic 4) Continue with BG control as A1c was 7.0 and goal is <5.7 5) Follow up with out patient neurology in 1-2 months. [facility] neurology [address] [place], [place] [address] [phone] or [facility] [phone]  Neurology S/O  Assessment and plan discussed with with attending physician and they are in agreement.    Felicie Morn PA-C [facility] [phone]  12/04/2012, 9:23 AM   57 yo F with probable tiny infarct, will need vascular surgery evaluation as outpatient for severe carotid stenosis, but this is asymptomatic and could be done as an outpatient. Other recs as above.   Ritta Slot, MD [facility] [phone]  If 7pm- 7am, please page neurology on call at [phone].

## 2012-12-05 ENCOUNTER — Telehealth: Payer: Self-pay | Admitting: Surgery

## 2012-12-05 NOTE — Telephone Encounter (Addendum)
Message copied by Rosalyn Charters on Fri Dec 05, 2012 12:18 PM ------      Message from: Nada Libman      Created: Thu Dec 04, 2012  4:18 PM       Please schedule patient to see me as a NEW patient for > 80% left carotid stenosis within the next 2 weeks.   Sheis a [facility] for a right brain stroke and should be d/c'd soon.  I did not see her as a consult ------  notified patient of appt. with dr. Myra Gianotti on 12-15-12 at11 am

## 2012-12-10 ENCOUNTER — Other Ambulatory Visit: Payer: Self-pay | Admitting: Family Medicine

## 2012-12-10 ENCOUNTER — Other Ambulatory Visit (HOSPITAL_COMMUNITY)
Admission: RE | Admit: 2012-12-10 | Discharge: 2012-12-10 | Disposition: A | Discharge status: Home / Self Care | Payer: 59 | Source: Ambulatory Visit | Attending: Family Medicine | Admitting: Family Medicine

## 2012-12-10 DIAGNOSIS — N760 Acute vaginitis: Secondary | ICD-10-CM | POA: Insufficient documentation

## 2012-12-10 DIAGNOSIS — Z124 Encounter for screening for malignant neoplasm of cervix: Secondary | ICD-10-CM | POA: Insufficient documentation

## 2012-12-10 DIAGNOSIS — Z113 Encounter for screening for infections with a predominantly sexual mode of transmission: Secondary | ICD-10-CM | POA: Insufficient documentation

## 2012-12-12 ENCOUNTER — Encounter: Payer: Self-pay | Admitting: Surgery

## 2012-12-15 ENCOUNTER — Ambulatory Visit (INDEPENDENT_AMBULATORY_CARE_PROVIDER_SITE_OTHER): Payer: 59 | Admitting: Surgery

## 2012-12-15 ENCOUNTER — Encounter: Payer: Self-pay | Admitting: Surgery

## 2012-12-15 DIAGNOSIS — R0989 Other specified symptoms and signs involving the circulatory and respiratory systems: Secondary | ICD-10-CM | POA: Insufficient documentation

## 2012-12-15 DIAGNOSIS — Z01810 Encounter for preprocedural cardiovascular examination: Secondary | ICD-10-CM

## 2012-12-15 DIAGNOSIS — I6529 Occlusion and stenosis of unspecified carotid artery: Secondary | ICD-10-CM

## 2012-12-15 NOTE — Addendum Note (Signed)
Addended by: Sharee Pimple on: 12/15/2012 01:26 PM   Modules accepted: Orders

## 2012-12-15 NOTE — Progress Notes (Signed)
Vascular and Vein Specialist of [facility]   Patient name: [name] MRN: [id number] DOB: [birth year] Sex: female   Referred by: Hospitalist   Reason for referral:  Chief Complaint  Patient presents with  . Carotid    new pt, carotid     HISTORY OF PRESENT ILLNESS: This is a very pleasant 58 year old female who was referred today for evaluation of a high-grade left carotid stenosis.  The patient initially presented to the hospital approximately 2 weeks ago with left arm tingling and slurred speech.  She was found to be very hypertensive and having significant headaches.  A full workup revealed a right brain stroke.  During her workup she was found to have a greater than 80% left carotid stenosis.  She is here today to discuss the left carotid stenosis.  She has no residual symptoms from her stroke.  She denies numbness or weakness in either extremity she denies slurred speech since her stent, she denies amaurosis fugax.  She did have an episode of her right hand drawing up and pain about 3 days ago but this resolved.  The patient has a history of coronary artery disease.  She had a heart attack in 2000.  This was treated medically.  She has a history of tobacco abuse but quit in 2003.  The patient is a diabetic.  Her blood sugars usually run around 122.  She does suffer from peripheral neuropathy secondary to her diabetes.  Her cholesterol is managed with a statin  Past Medical History  Diagnosis Date  . Diabetes mellitus   . Hypertension   . Myocardial infarction 2000  . Chronic pain following surgery or procedure 2008    Abdominal pain  . CAD (coronary artery disease)   . Stroke   . Carotid artery occlusion     Past Surgical History  Procedure Laterality Date  . Hernia repair  2008  . Abdominal hysterectomy  1999    partial  . Abdominal hysterectomy  2000    complete  . Cesarean section       X  2  . Knee arthroscopy      Left  . Abdominal surgery    . Bowel obstruction       History   Social History  . Marital Status: Widowed    Spouse Name: N/A    Number of Children: N/A  . Years of Education: N/A   Occupational History  . Not on file.   Social History Main Topics  . Smoking status: Former Smoker    Types: Cigarettes    Quit date: 02/05/2001  . Smokeless tobacco: Never Used  . Alcohol Use: 1.2 oz/week    2 Cans of beer per week     Comment: rarely  . Drug Use: No  . Sexual Activity: Not on file   Other Topics Concern  . Not on file   Social History Narrative  . No narrative on file    Family History  Problem Relation Age of Onset  . CAD Brother      X 2  . Diabetes Brother   . Heart disease Brother     before age 49  . Hyperlipidemia Brother   . Hypertension Brother   . Heart attack Brother   . CAD Sister      X 1  . Cancer Sister   . Diabetes Sister   . Heart disease Sister     before age 69  . Hyperlipidemia Sister   .  Hypertension Sister   . Hypertension      ALL  . Diabetes Mother   . Heart disease Mother   . Hyperlipidemia Mother   . Hypertension Mother   . Heart disease Father   . Hyperlipidemia Father   . Hypertension Father   . Hypertension Daughter     Allergies as of 12/15/2012 - Review Complete 12/15/2012  Allergen Reaction Noted  . Cymbalta [duloxetine hcl] Other (See Comments) 12/13/2011  . Penicillins Other (See Comments) 05/06/2011  . Tramadol  12/13/2011  . Trazodone and nefazodone  12/13/2011    Current Outpatient Prescriptions on File Prior to Visit  Medication Sig Dispense Refill  . aspirin EC 325 MG EC tablet Take 1 tablet (325 mg total) by mouth daily.  30 tablet  0  . gabapentin (NEURONTIN) 300 MG capsule Take 300 mg by mouth 2 (two) times daily.      . insulin NPH (HUMULIN N,NOVOLIN N) 100 UNIT/ML injection Inject 20 Units into the skin.       Marland Kitchen lisinopril (PRINIVIL) 20 MG tablet Take 1 tablet (20 mg total) by mouth daily.  30 tablet  0  . metFORMIN (GLUCOPHAGE) 500 MG tablet Take  500 mg by mouth 2 (two) times daily with a meal.      . potassium chloride (K-DUR) 10 MEQ tablet Take 10 mEq by mouth daily.      . rosuvastatin (CRESTOR) 40 MG tablet Take 40 mg by mouth daily.      Marland Kitchen atenolol (TENORMIN) 100 MG tablet Take 100 mg by mouth daily.       No current facility-administered medications on file prior to visit.     REVIEW OF SYSTEMS: Cardiovascular: Positive for shortness of breath on lying flat come with exertion, positive for pain in legs with walking and when lying flat. Pulmonary: No productive cough, asthma or wheezing. Neurologic: No weakness, paresthesias, aphasia, or amaurosis. No dizziness. Hematologic: No bleeding problems or clotting disorders. Musculoskeletal: No joint pain or joint swelling. Gastrointestinal: No blood in stool or hematemesis Genitourinary: No dysuria or hematuria. Psychiatric:: No history of major depression. Integumentary: No rashes or ulcers. Constitutional: No fever or chills.  PHYSICAL EXAMINATION: General: The patient appears their stated age.  Vital signs are BP 126/70  Pulse 56  Ht 5\' 5"  (1.651 m)  Wt 222 lb (100.699 kg)  BMI 36.94 kg/m2  SpO2 98% HEENT:  No gross abnormalities Pulmonary: Respirations are non-labored Abdomen: Soft and non-tender  Musculoskeletal: There are no major deformities.   Neurologic: No focal weakness or paresthesias are detected, Skin: There are no ulcer or rashes noted. Psychiatric: The patient has normal affect. Cardiovascular: There is a regular rate and rhythm without significant murmur appreciated.  No carotid bruits  Diagnostic Studies: I have reviewed her outside ultrasound which shows 80-99% stenosis on the left carotid artery.  There is minimal disease on the right   Assessment:  Asymptomatic left carotid stenosis Plan: I discussed the treatment options with the patient and her daughter which include medical management, surgical endarterectomy, and carotid stenting.  After  well-informed discussion, we have elected to proceed with a left carotid endarterectomy.  The risks and benefits of the operation were discussed.  These include but are not limited to the risk of stroke, nerve injury, cardiopulmonary complications.  I would like to give her additional time to recover from her recent right brain stroke.  Therefore I have scheduled her operation for Thursday, December 4.  I am going to order  a Myoview to evaluate her cardiac risk.     Jorge Ny, M.D. [facility] [place] Office: [phone] Pager:  [phone]

## 2012-12-17 ENCOUNTER — Other Ambulatory Visit: Payer: Self-pay

## 2012-12-25 ENCOUNTER — Encounter (HOSPITAL_COMMUNITY): Payer: Self-pay | Admitting: Pharmacy Technician

## 2012-12-27 NOTE — Pre-Procedure Instructions (Signed)
[name]  12/27/2012   Your procedure is scheduled on:  December 4  Report to [facility] Entrance "A" [address] at [employer] AM.  Call this number if you have problems the morning of surgery: [phone]   Remember:   Do not eat food or drink liquids after midnight.   Take these medicines the morning of surgery with A SIP OF WATER: Atenolol- Chlorthalidone (Tenoretic), Gabapentin, Levothyroxine,    STOP Diclofenac November 27   STOP Aleve, Naproxen, Advil, Ibuprofen, Vitamin, Herbs, or Supplements starting November 27  Do not wear jewelry, make-up or nail polish.  Do not wear lotions, powders, or perfumes. You may wear deodorant.  Do not shave 48 hours prior to surgery. Men may shave face and neck.  Do not bring valuables to the hospital.  [facility] is not responsible                  for any belongings or valuables.               Contacts, dentures or bridgework may not be worn into surgery.  Leave suitcase in the car. After surgery it may be brought to your room.  For patients admitted to the hospital, discharge time is determined by your                treatment team.                 Special Instructions: Shower using CHG 2 nights before surgery and the night before surgery.  If you shower the day of surgery use CHG.  Use special wash - you have one bottle of CHG for all showers.  You should use approximately 1/3 of the bottle for each shower.   Please read over the following fact sheets that you were given: Pain Booklet, Coughing and Deep Breathing, Blood Transfusion Information and Surgical Site Infection Prevention

## 2012-12-29 ENCOUNTER — Encounter (HOSPITAL_COMMUNITY)
Admission: RE | Admit: 2012-12-29 | Discharge: 2012-12-29 | Disposition: A | Discharge status: Home / Self Care | Payer: Managed Care, Other (non HMO) | Source: Ambulatory Visit | Attending: Surgery | Admitting: Surgery

## 2012-12-29 ENCOUNTER — Ambulatory Visit (HOSPITAL_COMMUNITY)
Admission: RE | Admit: 2012-12-29 | Discharge: 2012-12-29 | Disposition: A | Discharge status: Home / Self Care | Payer: Managed Care, Other (non HMO) | Source: Ambulatory Visit | Attending: Surgery | Admitting: Surgery

## 2012-12-29 ENCOUNTER — Encounter (HOSPITAL_COMMUNITY): Payer: Self-pay

## 2012-12-29 DIAGNOSIS — Z01818 Encounter for other preprocedural examination: Secondary | ICD-10-CM | POA: Insufficient documentation

## 2012-12-29 DIAGNOSIS — E119 Type 2 diabetes mellitus without complications: Secondary | ICD-10-CM | POA: Insufficient documentation

## 2012-12-29 DIAGNOSIS — Z87891 Personal history of nicotine dependence: Secondary | ICD-10-CM | POA: Insufficient documentation

## 2012-12-29 DIAGNOSIS — E039 Hypothyroidism, unspecified: Secondary | ICD-10-CM | POA: Insufficient documentation

## 2012-12-29 DIAGNOSIS — I10 Essential (primary) hypertension: Secondary | ICD-10-CM | POA: Insufficient documentation

## 2012-12-29 DIAGNOSIS — E669 Obesity, unspecified: Secondary | ICD-10-CM | POA: Insufficient documentation

## 2012-12-29 DIAGNOSIS — F411 Generalized anxiety disorder: Secondary | ICD-10-CM | POA: Insufficient documentation

## 2012-12-29 HISTORY — DX: Anxiety disorder, unspecified: F41.9

## 2012-12-29 HISTORY — DX: Headache: R51

## 2012-12-29 HISTORY — DX: Shortness of breath: R06.02

## 2012-12-29 HISTORY — DX: Hypothyroidism, unspecified: E03.9

## 2012-12-29 LAB — COMPREHENSIVE METABOLIC PANEL
ALT: 22 U/L (ref 0–35)
AST: 30 U/L (ref 0–37)
Albumin: 3.6 g/dL (ref 3.5–5.2)
Alkaline Phosphatase: 80 U/L (ref 39–117)
CO2: 24 mEq/L (ref 19–32)
Calcium: 9.1 mg/dL (ref 8.4–10.5)
Chloride: 103 mEq/L (ref 96–112)
GFR calc Af Amer: 57 mL/min — ABNORMAL LOW (ref >90)
GFR calc non Af Amer: 49 mL/min — ABNORMAL LOW (ref >90)
Glucose, Bld: 91 mg/dL (ref 70–99)
Potassium: 3.9 mEq/L (ref 3.5–5.1)
Sodium: 138 mEq/L (ref 135–145)
Total Bilirubin: 0.4 mg/dL (ref 0.3–1.2)

## 2012-12-29 LAB — CBC
Hemoglobin: 12.7 g/dL (ref 12.0–15.0)
MCHC: 34 g/dL (ref 30.0–36.0)
Platelets: 240 10*3/uL (ref 150–400)
RBC: 4.41 MIL/uL (ref 3.87–5.11)

## 2012-12-29 LAB — SURGICAL PCR SCREEN
MRSA, PCR: NEGATIVE
Staphylococcus aureus: NEGATIVE

## 2012-12-29 LAB — NO BLOOD PRODUCTS

## 2012-12-29 LAB — APTT: aPTT: 31 seconds (ref 24–37)

## 2012-12-29 NOTE — Progress Notes (Signed)
Left a voicemail /w Carol  [facility]- pt. Refuses blood transfusion.

## 2012-12-29 NOTE — Progress Notes (Signed)
12/29/12 1512  OBSTRUCTIVE SLEEP APNEA  Have you ever been diagnosed with sleep apnea through a sleep study? No  Do you snore loudly (loud enough to be heard through closed doors)?  0  Do you often feel tired, fatigued, or sleepy during the daytime? 1  Has anyone observed you stop breathing during your sleep? 0  Do you have, or are you being treated for high blood pressure? 1  BMI more than 35 kg/m2? 1  Age over 58 years old? 1  Neck circumference greater than 40 cm/18 inches? 0  Gender: 0  Obstructive Sleep Apnea Score 4  Score 4 or greater  Results sent to PCP

## 2012-12-30 ENCOUNTER — Encounter: Payer: Self-pay | Admitting: Cardiology

## 2012-12-30 ENCOUNTER — Ambulatory Visit (HOSPITAL_COMMUNITY): Payer: Managed Care, Other (non HMO) | Attending: Cardiology | Admitting: Radiology

## 2012-12-30 DIAGNOSIS — Z01810 Encounter for preprocedural cardiovascular examination: Secondary | ICD-10-CM | POA: Insufficient documentation

## 2012-12-30 DIAGNOSIS — I2510 Atherosclerotic heart disease of native coronary artery without angina pectoris: Secondary | ICD-10-CM

## 2012-12-30 DIAGNOSIS — R0989 Other specified symptoms and signs involving the circulatory and respiratory systems: Secondary | ICD-10-CM | POA: Insufficient documentation

## 2012-12-30 DIAGNOSIS — R0609 Other forms of dyspnea: Secondary | ICD-10-CM | POA: Insufficient documentation

## 2012-12-30 DIAGNOSIS — R0602 Shortness of breath: Secondary | ICD-10-CM

## 2012-12-30 DIAGNOSIS — I6529 Occlusion and stenosis of unspecified carotid artery: Secondary | ICD-10-CM | POA: Insufficient documentation

## 2012-12-30 MED ORDER — TECHNETIUM TC 99M SESTAMIBI GENERIC - CARDIOLITE
33.0000 | Freq: Once | INTRAVENOUS | Status: AC | PRN
  Administered 2012-12-30: 33 via INTRAVENOUS

## 2012-12-30 MED ORDER — TECHNETIUM TC 99M SESTAMIBI GENERIC - CARDIOLITE
11.0000 | Freq: Once | INTRAVENOUS | Status: AC | PRN
  Administered 2012-12-30: 11 via INTRAVENOUS

## 2012-12-30 MED ORDER — REGADENOSON 0.4 MG/5ML IV SOLN
0.4000 mg | Freq: Once | INTRAVENOUS | Status: AC
  Administered 2012-12-30: 0.4 mg via INTRAVENOUS

## 2012-12-30 NOTE — Progress Notes (Addendum)
Anesthesia Chart Review:  Patient is a 57 year old female scheduled for left CEA on 01/08/13 by Dr. Myra Gianotti.  She was admitted 12/02/12-12/04/12 for LUE weakness with initial differential of TIA versus complex migraine in the setting of significant hypertensive.  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 She was seen by Neurologist Dr. Amada Jupiter and referred to [facility] out-patient for evaluation and treatment of her severe left ICA stenosis.  Other history includes former smoker, obesity, right brain CVA 12/02/12 without residual, CAD/MI '00 that was treated medically, DM2, hypothyroidism, HTN, anxiety, hysterectomy, appendectomy, tonsillectomy, VHR '12.  OSA screening score was 4.  PCP is Dr. Mila Palmer. Dr. Myra Gianotti ordered a preoperative stress test due to her CAD history.  She does not see a cardiologist.   Echo on 12/03/12 showed:  - Left ventricle: The cavity size was normal. Wall thickness was increased in a pattern of mild LVH. Systolic function was normal. The estimated ejection fraction was in the range of 60% to 65%. Wall motion was normal; there were no regional wall motion abnormalities. Doppler parameters are consistent with abnormal left ventricular relaxation (grade 1 diastolic dysfunction). - Left atrium: The atrium was mildly dilated. - Mitral valve: Trivial regurgitation.  Carotid duplex on 12/15/12 showed: Right 1% to 39% ICA stenosis, Left 80% to 99% proximal ICA stenosis, Bilateral Vertebral artery flow is antegrade.  CXR on 12/29/12  showed: Coarsening of the pulmonary interstitium is again identified. Subcentimeter pulmonary nodules are better demonstrated on CT scan (thought to be consistent with a benign process; although one additional one year follow-up CT scan was recommended by the radiologist on 07/16/12). Lungs are otherwise clear. Heart size is normal. No pneumothorax or pleural fluid. No acute disease.   Preoperative labs noted.  CBC WNL. She has refused all blood products. She is scheduled for a UA on the day of surgery.  She did have a positive urine culture for E. Coli on 12/02/12. I did confirm with patient that Dr. Paulino Rily did treat this with antibiotics.  Patient stated that she was unable to give a urine specimen yesterday at her PAT visit, so this will have to be done on the day of surgery.  Will follow-up once stress test results are available.    Velna Ochs [facility] Short Stay Center/Anesthesiology Phone [phone] 12/30/2012 4:53 PM  Addendum: 12/31/2012 12:30 PM Nuclear stress test results from 12/30/2012 showed: Low risk stress nuclear study with a fixed, small mild apical anterior and apical perfusion defect. Given normal wall motion, this could be soft tissue attenuation. However, cannot rule out prior infarction. No evidence for ischemia. LV Ejection Fraction: 61%. LV Wall Motion: NL LV Function; NL Wall Motion.

## 2012-12-30 NOTE — Progress Notes (Signed)
[facility] SITE 3 NUCLEAR MED [address] [place], [place] [address] [phone]    Cardiology Nuclear Med Study  [name] is a 57 y.o. female     MRN : [id number]     DOB: January 30, 1956  Procedure Date: 12/30/2012  Nuclear Med Background Indication for Stress Test:  Evaluation for Ischemia, and Surgical Clearance for  (L) Carotid Endarterectomy on 01-08-13 by Nada Libman, MD History:  CAD, MI 2000, Heart Cath, Echo 2014 EF 60-65%, Acute Renal Failure Cardiac Risk Factors: Carotid Disease, CVA, Family History - CAD, History of Smoking, Hypertension, IDDM,and Lipids  Symptoms:  DOE   Nuclear Pre-Procedure Caffeine/Decaff Intake:  None > 12 hrs NPO After: 7:00pm   Lungs:  clear O2 Sat: 97% on room air. IV 0.9% NS with Angio Cath:  22g  IV Site: R Antecubital x 1, tolerated well IV Started by:  Irean Hong, RN  Chest Size (in):  38 Cup Size: C  Height: 5\' 5"  (1.651 m)  Weight:  216 lb (97.977 kg)  BMI:  Body mass index is 35.94 kg/(m^2). Tech Comments:  Patient took Tenoretic and Lisinopril this am. 1/2 dose of Novolin Insulin last night, no insulin or metformin today. Fasting CBG was 113 at 0600 today. Irean Hong, RN.    Nuclear Med Study 1 or 2 day study: 1 day  Stress Test Type:  Eugenie Birks  Reading MD: Marca Ancona, MD  Order Authorizing Provider:  Nada Libman, MD  Resting Radionuclide: Technetium 70m Sestamibi  Resting Radionuclide Dose: 11.0 mCi   Stress Radionuclide:  Technetium 26m Sestamibi  Stress Radionuclide Dose: 33.0 mCi           Stress Protocol Rest HR: 53 Stress HR: 68  Rest BP: 166/98 Stress BP: 149/93  Exercise Time (min): n/a METS: n/a   Predicted Max HR: 162 bpm % Max HR: 41.98 bpm Rate Pressure Product: 78469   Dose of Adenosine (mg):  n/a Dose of Lexiscan: 0.4 mg  Dose of Atropine (mg): n/a Dose of Dobutamine: n/a mcg/kg/min (at max HR)  Stress Test Technologist: Nelson Chimes, BS-ES  Nuclear Technologist:  Domenic Polite, CNMT     Rest Procedure:  Myocardial perfusion imaging was performed at rest 45 minutes following the intravenous administration of Technetium 27m Sestamibi. Rest ECG: NSR, inferior ST depression.   Stress Procedure:  The patient received IV Lexiscan 0.4 mg over 15-seconds.  Technetium 15m Sestamibi injected at 30-seconds.  Quantitative spect images were obtained after a 45 minute delay.  During the infusion of Lexiscan, patient complained of SOB and stomach hurt.  Symptoms began to resolve in recovery.  Stress ECG: No significant change from baseline ECG  QPS Raw Data Images:  Normal; no motion artifact; normal heart/lung ratio. Stress Images:  Small, mild apical anterior and apical perfusion defect.  Rest Images:  Small, mild apical anterior and apical perfusion defect.  Subtraction (SDS):  Fixed, small, mild apical anterior and apical perfusion defect.  Transient Ischemic Dilatation (Normal <1.22):  1.02 Lung/Heart Ratio (Normal <0.45):  0.34  Quantitative Gated Spect Images QGS EDV:  112 ml QGS ESV:  44 ml  Impression Exercise Capacity:  Lexiscan with no exercise. BP Response:  Normal blood pressure response. Clinical Symptoms:  Abdominal discomfort, dyspnea.  ECG Impression:  NSR with baseline inferior ST depression.  No significant change with infusion.  Comparison with Prior Nuclear Study: No images to compare  Overall Impression:  Low risk stress nuclear study with a  fixed, small mild apical anterior and apical perfusion defect.  Given normal wall motion, this could be soft tissue attenuation.  However, cannot rule out prior infarction.  No evidence for ischemia. .  LV Ejection Fraction: 61%.  LV Wall Motion:  NL LV Function; NL Wall Motion  Marca Ancona 12/30/2012

## 2013-01-02 ENCOUNTER — Other Ambulatory Visit: Payer: Self-pay | Admitting: Internal Medicine

## 2013-01-05 ENCOUNTER — Other Ambulatory Visit: Payer: Self-pay | Admitting: Internal Medicine

## 2013-01-07 MED ORDER — VANCOMYCIN HCL IN DEXTROSE 1-5 GM/200ML-% IV SOLN
1000.0000 mg | INTRAVENOUS | Status: AC
  Administered 2013-01-08: 1000 mg via INTRAVENOUS
  Filled 2013-01-07: qty 200

## 2013-01-08 ENCOUNTER — Inpatient Hospital Stay (HOSPITAL_COMMUNITY): Payer: Managed Care, Other (non HMO) | Admitting: Critical Care Medicine

## 2013-01-08 ENCOUNTER — Encounter (HOSPITAL_COMMUNITY): Payer: Self-pay | Admitting: Critical Care Medicine

## 2013-01-08 ENCOUNTER — Encounter (HOSPITAL_COMMUNITY): Payer: Managed Care, Other (non HMO) | Admitting: Vascular Surgery

## 2013-01-08 ENCOUNTER — Encounter (HOSPITAL_COMMUNITY)
Admission: RE | Disposition: A | Discharge status: Home / Self Care | Payer: Self-pay | Source: Ambulatory Visit | Attending: Surgery

## 2013-01-08 ENCOUNTER — Inpatient Hospital Stay (HOSPITAL_COMMUNITY)
Admission: RE | Admit: 2013-01-08 | Discharge: 2013-01-09 | DRG: 039 | Disposition: A | Discharge status: Home / Self Care | Payer: Managed Care, Other (non HMO) | Source: Ambulatory Visit | Attending: Surgery | Admitting: Surgery

## 2013-01-08 DIAGNOSIS — Z87891 Personal history of nicotine dependence: Secondary | ICD-10-CM

## 2013-01-08 DIAGNOSIS — Z79899 Other long term (current) drug therapy: Secondary | ICD-10-CM

## 2013-01-08 DIAGNOSIS — Z8673 Personal history of transient ischemic attack (TIA), and cerebral infarction without residual deficits: Secondary | ICD-10-CM

## 2013-01-08 DIAGNOSIS — E1142 Type 2 diabetes mellitus with diabetic polyneuropathy: Secondary | ICD-10-CM | POA: Diagnosis present

## 2013-01-08 DIAGNOSIS — E1149 Type 2 diabetes mellitus with other diabetic neurological complication: Secondary | ICD-10-CM | POA: Diagnosis present

## 2013-01-08 DIAGNOSIS — I10 Essential (primary) hypertension: Secondary | ICD-10-CM | POA: Diagnosis present

## 2013-01-08 DIAGNOSIS — I6529 Occlusion and stenosis of unspecified carotid artery: Secondary | ICD-10-CM

## 2013-01-08 DIAGNOSIS — I2510 Atherosclerotic heart disease of native coronary artery without angina pectoris: Secondary | ICD-10-CM | POA: Diagnosis present

## 2013-01-08 DIAGNOSIS — I252 Old myocardial infarction: Secondary | ICD-10-CM

## 2013-01-08 DIAGNOSIS — F411 Generalized anxiety disorder: Secondary | ICD-10-CM | POA: Diagnosis present

## 2013-01-08 DIAGNOSIS — Z7982 Long term (current) use of aspirin: Secondary | ICD-10-CM

## 2013-01-08 DIAGNOSIS — E669 Obesity, unspecified: Secondary | ICD-10-CM | POA: Diagnosis present

## 2013-01-08 DIAGNOSIS — Z794 Long term (current) use of insulin: Secondary | ICD-10-CM

## 2013-01-08 HISTORY — PX: ENDARTERECTOMY: SHX5162

## 2013-01-08 HISTORY — PX: PATCH ANGIOPLASTY: SHX6230

## 2013-01-08 LAB — CBC
HCT: 36.3 % (ref 36.0–46.0)
Hemoglobin: 12.1 g/dL (ref 12.0–15.0)
MCHC: 33.3 g/dL (ref 30.0–36.0)
MCV: 83.3 fL (ref 78.0–100.0)
Platelets: 207 10*3/uL (ref 150–400)
RBC: 4.36 MIL/uL (ref 3.87–5.11)
WBC: 9.9 10*3/uL (ref 4.0–10.5)

## 2013-01-08 LAB — URINALYSIS, ROUTINE W REFLEX MICROSCOPIC
Bilirubin Urine: NEGATIVE
Hgb urine dipstick: NEGATIVE
Nitrite: NEGATIVE
Protein, ur: NEGATIVE mg/dL
Specific Gravity, Urine: 1.014 (ref 1.005–1.030)
pH: 5.5 (ref 5.0–8.0)

## 2013-01-08 LAB — GLUCOSE, CAPILLARY
Glucose-Capillary: 159 mg/dL — ABNORMAL HIGH (ref 70–99)
Glucose-Capillary: 161 mg/dL — ABNORMAL HIGH (ref 70–99)

## 2013-01-08 LAB — CREATININE, SERUM: GFR calc Af Amer: 74 mL/min — ABNORMAL LOW (ref >90)

## 2013-01-08 SURGERY — ENDARTERECTOMY, CAROTID
Anesthesia: General | Site: Neck | Laterality: Left

## 2013-01-08 MED ORDER — DOCUSATE SODIUM 100 MG PO CAPS
100.0000 mg | ORAL_CAPSULE | Freq: Every day | ORAL | Status: DC
  Administered 2013-01-09: 100 mg via ORAL
  Filled 2013-01-08: qty 1

## 2013-01-08 MED ORDER — ONDANSETRON HCL 4 MG/2ML IJ SOLN
INTRAMUSCULAR | Status: DC | PRN
  Administered 2013-01-08: 4 mg via INTRAVENOUS

## 2013-01-08 MED ORDER — OXYCODONE HCL 5 MG PO TABS: 5.0000 mg | ORAL_TABLET | Freq: Once | ORAL | Status: DC | PRN

## 2013-01-08 MED ORDER — ATENOLOL 100 MG PO TABS
100.0000 mg | ORAL_TABLET | Freq: Every day | ORAL | Status: DC
  Administered 2013-01-09: 100 mg via ORAL
  Filled 2013-01-08: qty 1

## 2013-01-08 MED ORDER — SODIUM CHLORIDE 0.9 % IV SOLN: 500.0000 mL | Freq: Once | INTRAVENOUS | Status: AC | PRN

## 2013-01-08 MED ORDER — GUAIFENESIN-DM 100-10 MG/5ML PO SYRP: 15.0000 mL | ORAL_SOLUTION | ORAL | Status: DC | PRN

## 2013-01-08 MED ORDER — BISACODYL 10 MG RE SUPP: 10.0000 mg | Freq: Every day | RECTAL | Status: DC | PRN

## 2013-01-08 MED ORDER — ASPIRIN EC 81 MG PO TBEC
81.0000 mg | DELAYED_RELEASE_TABLET | Freq: Every day | ORAL | Status: DC
  Administered 2013-01-09: 81 mg via ORAL
  Filled 2013-01-08 (×2): qty 1

## 2013-01-08 MED ORDER — LABETALOL HCL 5 MG/ML IV SOLN
10.0000 mg | INTRAVENOUS | Status: DC | PRN
  Administered 2013-01-08: 10 mg via INTRAVENOUS
  Filled 2013-01-08: qty 4

## 2013-01-08 MED ORDER — VANCOMYCIN HCL IN DEXTROSE 1-5 GM/200ML-% IV SOLN
1000.0000 mg | Freq: Two times a day (BID) | INTRAVENOUS | Status: AC
  Administered 2013-01-08: 1000 mg via INTRAVENOUS
  Filled 2013-01-08: qty 200

## 2013-01-08 MED ORDER — PANTOPRAZOLE SODIUM 40 MG PO TBEC
40.0000 mg | DELAYED_RELEASE_TABLET | Freq: Every day | ORAL | Status: DC
  Administered 2013-01-09: 40 mg via ORAL
  Filled 2013-01-08: qty 1

## 2013-01-08 MED ORDER — ALBUMIN HUMAN 5 % IV SOLN
12.5000 g | Freq: Once | INTRAVENOUS | Status: DC
  Administered 2013-01-08: 12.5 g via INTRAVENOUS

## 2013-01-08 MED ORDER — CHLORTHALIDONE 25 MG PO TABS
25.0000 mg | ORAL_TABLET | Freq: Every day | ORAL | Status: DC
  Administered 2013-01-09: 25 mg via ORAL
  Filled 2013-01-08: qty 1

## 2013-01-08 MED ORDER — SODIUM CHLORIDE 0.9 % IV SOLN
INTRAVENOUS | Status: DC
  Administered 2013-01-08: 15:00:00 via INTRAVENOUS

## 2013-01-08 MED ORDER — ONDANSETRON HCL 4 MG/2ML IJ SOLN: 4.0000 mg | Freq: Four times a day (QID) | INTRAMUSCULAR | Status: DC | PRN

## 2013-01-08 MED ORDER — LEVOTHYROXINE SODIUM 100 MCG PO TABS
100.0000 ug | ORAL_TABLET | Freq: Every day | ORAL | Status: DC
  Administered 2013-01-09: 100 ug via ORAL
  Filled 2013-01-08 (×2): qty 1

## 2013-01-08 MED ORDER — VANCOMYCIN HCL IN DEXTROSE 1-5 GM/200ML-% IV SOLN
1000.0000 mg | Freq: Two times a day (BID) | INTRAVENOUS | Status: DC
  Filled 2013-01-08: qty 200

## 2013-01-08 MED ORDER — METFORMIN HCL 500 MG PO TABS
500.0000 mg | ORAL_TABLET | Freq: Two times a day (BID) | ORAL | Status: DC
  Administered 2013-01-09: 500 mg via ORAL
  Filled 2013-01-08 (×4): qty 1

## 2013-01-08 MED ORDER — LABETALOL HCL 5 MG/ML IV SOLN
INTRAVENOUS | Status: DC | PRN
  Administered 2013-01-08 (×2): 5 mg via INTRAVENOUS

## 2013-01-08 MED ORDER — HEMOSTATIC AGENTS (NO CHARGE) OPTIME
TOPICAL | Status: DC | PRN
  Administered 2013-01-08: 1 via TOPICAL

## 2013-01-08 MED ORDER — ALUM & MAG HYDROXIDE-SIMETH 200-200-20 MG/5ML PO SUSP: 15.0000 mL | ORAL | Status: DC | PRN

## 2013-01-08 MED ORDER — MEPERIDINE HCL 25 MG/ML IJ SOLN: 6.2500 mg | INTRAMUSCULAR | Status: DC | PRN

## 2013-01-08 MED ORDER — ARTIFICIAL TEARS OP OINT
TOPICAL_OINTMENT | OPHTHALMIC | Status: DC | PRN
  Administered 2013-01-08: 1 via OPHTHALMIC

## 2013-01-08 MED ORDER — ENOXAPARIN SODIUM 30 MG/0.3ML ~~LOC~~ SOLN: 30.0000 mg | SUBCUTANEOUS | Status: DC

## 2013-01-08 MED ORDER — NEOSTIGMINE METHYLSULFATE 1 MG/ML IJ SOLN
INTRAMUSCULAR | Status: DC | PRN
  Administered 2013-01-08: 4 mg via INTRAVENOUS

## 2013-01-08 MED ORDER — PROPOFOL 10 MG/ML IV BOLUS
INTRAVENOUS | Status: DC | PRN
  Administered 2013-01-08: 140 mg via INTRAVENOUS

## 2013-01-08 MED ORDER — ACETAMINOPHEN 325 MG PO TABS: 325.0000 mg | ORAL_TABLET | ORAL | Status: DC | PRN

## 2013-01-08 MED ORDER — OXYCODONE HCL 5 MG/5ML PO SOLN: 5.0000 mg | Freq: Once | ORAL | Status: DC | PRN

## 2013-01-08 MED ORDER — INSULIN ASPART PROT & ASPART (70-30 MIX) 100 UNIT/ML ~~LOC~~ SUSP
20.0000 [IU] | Freq: Two times a day (BID) | SUBCUTANEOUS | Status: DC
  Filled 2013-01-08: qty 10

## 2013-01-08 MED ORDER — LISINOPRIL 20 MG PO TABS
20.0000 mg | ORAL_TABLET | Freq: Every day | ORAL | Status: DC
  Administered 2013-01-09: 20 mg via ORAL
  Filled 2013-01-08: qty 1

## 2013-01-08 MED ORDER — LIDOCAINE HCL (CARDIAC) 20 MG/ML IV SOLN
INTRAVENOUS | Status: DC | PRN
  Administered 2013-01-08: 60 mg via INTRAVENOUS

## 2013-01-08 MED ORDER — INSULIN ASPART PROT & ASPART (70-30 MIX) 100 UNIT/ML ~~LOC~~ SUSP
20.0000 [IU] | Freq: Two times a day (BID) | SUBCUTANEOUS | Status: DC
  Administered 2013-01-09: 20 [IU] via SUBCUTANEOUS
  Filled 2013-01-08: qty 10

## 2013-01-08 MED ORDER — 0.9 % SODIUM CHLORIDE (POUR BTL) OPTIME
TOPICAL | Status: DC | PRN
  Administered 2013-01-08: 3000 mL

## 2013-01-08 MED ORDER — INSULIN ASPART PROT & ASPART (70-30 MIX) 100 UNIT/ML ~~LOC~~ SUSP
10.0000 [IU] | Freq: Two times a day (BID) | SUBCUTANEOUS | Status: AC
  Administered 2013-01-08: 10 [IU] via SUBCUTANEOUS
  Filled 2013-01-08: qty 10

## 2013-01-08 MED ORDER — PHENOL 1.4 % MT LIQD
1.0000 | OROMUCOSAL | Status: DC | PRN
  Administered 2013-01-08: 1 via OROMUCOSAL
  Filled 2013-01-08: qty 177

## 2013-01-08 MED ORDER — DEXTROSE 5 % IV SOLN: 1.5000 g | Freq: Two times a day (BID) | INTRAVENOUS | Status: DC

## 2013-01-08 MED ORDER — FENTANYL CITRATE 0.05 MG/ML IJ SOLN
25.0000 ug | INTRAMUSCULAR | Status: DC | PRN
  Administered 2013-01-08: 25 ug via INTRAVENOUS
  Administered 2013-01-08: 50 ug via INTRAVENOUS
  Administered 2013-01-08: 25 ug via INTRAVENOUS

## 2013-01-08 MED ORDER — FENTANYL CITRATE 0.05 MG/ML IJ SOLN
INTRAMUSCULAR | Status: DC | PRN
  Administered 2013-01-08: 250 ug via INTRAVENOUS

## 2013-01-08 MED ORDER — ATENOLOL-CHLORTHALIDONE 100-25 MG PO TABS: 1.0000 | ORAL_TABLET | Freq: Every day | ORAL | Status: DC

## 2013-01-08 MED ORDER — ROCURONIUM BROMIDE 100 MG/10ML IV SOLN
INTRAVENOUS | Status: DC | PRN
  Administered 2013-01-08: 50 mg via INTRAVENOUS

## 2013-01-08 MED ORDER — MORPHINE SULFATE 2 MG/ML IJ SOLN
2.0000 mg | INTRAMUSCULAR | Status: DC | PRN
  Administered 2013-01-08 – 2013-01-09 (×6): 2 mg via INTRAVENOUS
  Filled 2013-01-08 (×6): qty 1

## 2013-01-08 MED ORDER — GABAPENTIN 300 MG PO CAPS
300.0000 mg | ORAL_CAPSULE | Freq: Two times a day (BID) | ORAL | Status: DC
  Administered 2013-01-08 – 2013-01-09 (×2): 300 mg via ORAL
  Filled 2013-01-08 (×3): qty 1

## 2013-01-08 MED ORDER — POTASSIUM CHLORIDE CRYS ER 20 MEQ PO TBCR: 20.0000 meq | EXTENDED_RELEASE_TABLET | Freq: Once | ORAL | Status: AC | PRN

## 2013-01-08 MED ORDER — GLYCOPYRROLATE 0.2 MG/ML IJ SOLN
INTRAMUSCULAR | Status: DC | PRN
  Administered 2013-01-08: 0.6 mg via INTRAVENOUS

## 2013-01-08 MED ORDER — SODIUM CHLORIDE 0.9 % IV SOLN: INTRAVENOUS | Status: DC

## 2013-01-08 MED ORDER — ALBUMIN HUMAN 5 % IV SOLN
INTRAVENOUS | Status: AC
  Filled 2013-01-08: qty 250

## 2013-01-08 MED ORDER — ATORVASTATIN CALCIUM 40 MG PO TABS
40.0000 mg | ORAL_TABLET | Freq: Every day | ORAL | Status: DC
  Filled 2013-01-08 (×2): qty 1

## 2013-01-08 MED ORDER — METOPROLOL TARTRATE 1 MG/ML IV SOLN: 2.0000 mg | INTRAVENOUS | Status: DC | PRN

## 2013-01-08 MED ORDER — FENTANYL CITRATE 0.05 MG/ML IJ SOLN
INTRAMUSCULAR | Status: AC
  Administered 2013-01-08: 25 ug via INTRAVENOUS
  Filled 2013-01-08: qty 2

## 2013-01-08 MED ORDER — LIDOCAINE HCL (PF) 1 % IJ SOLN
INTRAMUSCULAR | Status: AC
  Filled 2013-01-08: qty 30

## 2013-01-08 MED ORDER — MAGNESIUM SULFATE 40 MG/ML IJ SOLN
2.0000 g | Freq: Once | INTRAMUSCULAR | Status: AC | PRN
  Filled 2013-01-08: qty 50

## 2013-01-08 MED ORDER — DIPHENHYDRAMINE HCL 50 MG/ML IJ SOLN
INTRAMUSCULAR | Status: AC
  Filled 2013-01-08: qty 1

## 2013-01-08 MED ORDER — ALBUMIN HUMAN 5 % IV SOLN
INTRAVENOUS | Status: AC
  Administered 2013-01-08: 12.5 g via INTRAVENOUS
  Filled 2013-01-08: qty 250

## 2013-01-08 MED ORDER — PROTAMINE SULFATE 10 MG/ML IV SOLN
INTRAVENOUS | Status: DC | PRN
  Administered 2013-01-08: 50 mg via INTRAVENOUS

## 2013-01-08 MED ORDER — HYDRALAZINE HCL 20 MG/ML IJ SOLN: 10.0000 mg | INTRAMUSCULAR | Status: DC | PRN

## 2013-01-08 MED ORDER — ACETAMINOPHEN 650 MG RE SUPP: 325.0000 mg | RECTAL | Status: DC | PRN

## 2013-01-08 MED ORDER — PHENYLEPHRINE HCL 10 MG/ML IJ SOLN
10.0000 mg | INTRAVENOUS | Status: DC | PRN
  Administered 2013-01-08: 100 ug/min via INTRAVENOUS

## 2013-01-08 MED ORDER — ENOXAPARIN SODIUM 30 MG/0.3ML ~~LOC~~ SOLN
30.0000 mg | SUBCUTANEOUS | Status: DC
  Administered 2013-01-09: 30 mg via SUBCUTANEOUS
  Filled 2013-01-08 (×2): qty 0.3

## 2013-01-08 MED ORDER — HEPARIN SODIUM (PORCINE) 5000 UNIT/ML IJ SOLN
INTRAMUSCULAR | Status: DC | PRN
  Administered 2013-01-08: 08:00:00

## 2013-01-08 MED ORDER — POTASSIUM CHLORIDE CRYS ER 10 MEQ PO TBCR
10.0000 meq | EXTENDED_RELEASE_TABLET | Freq: Two times a day (BID) | ORAL | Status: DC
  Administered 2013-01-08 – 2013-01-09 (×2): 10 meq via ORAL
  Filled 2013-01-08 (×3): qty 1

## 2013-01-08 MED ORDER — HEPARIN SODIUM (PORCINE) 1000 UNIT/ML IJ SOLN
INTRAMUSCULAR | Status: DC | PRN
  Administered 2013-01-08: 1000 [IU] via INTRAVENOUS
  Administered 2013-01-08: 8000 [IU] via INTRAVENOUS

## 2013-01-08 MED ORDER — DICLOFENAC SODIUM 25 MG PO TBEC
25.0000 mg | DELAYED_RELEASE_TABLET | Freq: Two times a day (BID) | ORAL | Status: DC
  Filled 2013-01-08: qty 1

## 2013-01-08 MED ORDER — INSULIN ASPART 100 UNIT/ML ~~LOC~~ SOLN
0.0000 [IU] | Freq: Three times a day (TID) | SUBCUTANEOUS | Status: DC
  Administered 2013-01-08 – 2013-01-09 (×2): 4 [IU] via SUBCUTANEOUS

## 2013-01-08 MED ORDER — PROMETHAZINE HCL 25 MG/ML IJ SOLN: 6.2500 mg | INTRAMUSCULAR | Status: DC | PRN

## 2013-01-08 MED ORDER — OXYCODONE-ACETAMINOPHEN 5-325 MG PO TABS
1.0000 | ORAL_TABLET | ORAL | Status: DC | PRN
  Administered 2013-01-09: 1 via ORAL
  Administered 2013-01-09: 2 via ORAL
  Filled 2013-01-08: qty 2
  Filled 2013-01-08: qty 1

## 2013-01-08 MED ORDER — DOPAMINE-DEXTROSE 3.2-5 MG/ML-% IV SOLN: 3.0000 ug/kg/min | INTRAVENOUS | Status: DC

## 2013-01-08 MED ORDER — DIPHENHYDRAMINE HCL 50 MG/ML IJ SOLN
25.0000 mg | Freq: Four times a day (QID) | INTRAMUSCULAR | Status: DC | PRN
  Administered 2013-01-08: 25 mg via INTRAVENOUS

## 2013-01-08 MED ORDER — SENNOSIDES-DOCUSATE SODIUM 8.6-50 MG PO TABS
1.0000 | ORAL_TABLET | Freq: Every evening | ORAL | Status: DC | PRN
  Filled 2013-01-08: qty 1

## 2013-01-08 MED ORDER — LACTATED RINGERS IV SOLN
INTRAVENOUS | Status: DC | PRN
  Administered 2013-01-08 (×2): via INTRAVENOUS

## 2013-01-08 SURGICAL SUPPLY — 52 items
ADH SKN CLS APL DERMABOND .7 (GAUZE/BANDAGES/DRESSINGS) ×1
CANISTER SUCTION 2500CC (MISCELLANEOUS) ×2 IMPLANT
CATH ROBINSON RED A/P 18FR (CATHETERS) ×2 IMPLANT
CATH SUCT 10FR WHISTLE TIP (CATHETERS) ×2 IMPLANT
CLIP TI MEDIUM 6 (CLIP) ×3 IMPLANT
CLIP TI WIDE RED SMALL 6 (CLIP) ×4 IMPLANT
COVER SURGICAL LIGHT HANDLE (MISCELLANEOUS) ×2 IMPLANT
CRADLE DONUT ADULT HEAD (MISCELLANEOUS) ×2 IMPLANT
DERMABOND ADVANCED (GAUZE/BANDAGES/DRESSINGS) ×1
DERMABOND ADVANCED .7 DNX12 (GAUZE/BANDAGES/DRESSINGS) ×1 IMPLANT
DRAIN CHANNEL 15F RND FF W/TCR (WOUND CARE) IMPLANT
DRAPE PROXIMA HALF (DRAPES) ×2 IMPLANT
DRAPE WARM FLUID 44X44 (DRAPE) ×2 IMPLANT
ELECT REM PT RETURN 9FT ADLT (ELECTROSURGICAL) ×2
ELECTRODE REM PT RTRN 9FT ADLT (ELECTROSURGICAL) ×1 IMPLANT
EVACUATOR SILICONE 100CC (DRAIN) IMPLANT
GLOVE BIOGEL PI IND STRL 6.5 (GLOVE) IMPLANT
GLOVE BIOGEL PI IND STRL 7.5 (GLOVE) ×1 IMPLANT
GLOVE BIOGEL PI INDICATOR 6.5 (GLOVE) ×1
GLOVE BIOGEL PI INDICATOR 7.5 (GLOVE) ×2
GLOVE SS BIOGEL STRL SZ 7 (GLOVE) IMPLANT
GLOVE SUPERSENSE BIOGEL SZ 7 (GLOVE) ×1
GLOVE SURG SS PI 7.0 STRL IVOR (GLOVE) ×1 IMPLANT
GLOVE SURG SS PI 7.5 STRL IVOR (GLOVE) ×2 IMPLANT
GOWN PREVENTION PLUS XXLARGE (GOWN DISPOSABLE) ×2 IMPLANT
GOWN STRL NON-REIN LRG LVL3 (GOWN DISPOSABLE) ×6 IMPLANT
HEMOSTAT SNOW SURGICEL 2X4 (HEMOSTASIS) ×1 IMPLANT
INSERT FOGARTY SM (MISCELLANEOUS) IMPLANT
KIT BASIN OR (CUSTOM PROCEDURE TRAY) ×2 IMPLANT
KIT ROOM TURNOVER OR (KITS) ×2 IMPLANT
KIT SHUNT ARGYLE CAROTID ART 6 (VASCULAR PRODUCTS) ×1 IMPLANT
NS IRRIG 1000ML POUR BTL (IV SOLUTION) ×5 IMPLANT
PACK CAROTID (CUSTOM PROCEDURE TRAY) ×2 IMPLANT
PAD ARMBOARD 7.5X6 YLW CONV (MISCELLANEOUS) ×4 IMPLANT
PATCH VASCULAR VASCU GUARD 1X6 (Vascular Products) ×1 IMPLANT
SHUNT CAROTID BYPASS 10 (VASCULAR PRODUCTS) IMPLANT
SHUNT CAROTID BYPASS 12FRX15.5 (VASCULAR PRODUCTS) IMPLANT
SPONGE INTESTINAL PEANUT (DISPOSABLE) ×2 IMPLANT
SUT ETHILON 3 0 PS 1 (SUTURE) IMPLANT
SUT PROLENE 6 0 BV (SUTURE) ×5 IMPLANT
SUT PROLENE 7 0 BV 1 (SUTURE) IMPLANT
SUT PROLENE 7 0 BV1 MDA (SUTURE) ×2 IMPLANT
SUT SILK 3 0 (SUTURE) ×2
SUT SILK 3 0 TIES 17X18 (SUTURE)
SUT SILK 3-0 18XBRD TIE 12 (SUTURE) ×1 IMPLANT
SUT SILK 3-0 18XBRD TIE BLK (SUTURE) IMPLANT
SUT VIC AB 3-0 SH 27 (SUTURE) ×6
SUT VIC AB 3-0 SH 27X BRD (SUTURE) ×2 IMPLANT
SUT VICRYL 4-0 PS2 18IN ABS (SUTURE) ×2 IMPLANT
TOWEL OR 17X24 6PK STRL BLUE (TOWEL DISPOSABLE) ×2 IMPLANT
TOWEL OR 17X26 10 PK STRL BLUE (TOWEL DISPOSABLE) ×2 IMPLANT
WATER STERILE IRR 1000ML POUR (IV SOLUTION) ×2 IMPLANT

## 2013-01-08 NOTE — Transfer of Care (Signed)
Immediate Anesthesia Transfer of Care Note  Patient: [name]  Procedure(s) Performed: Procedure(s): ENDARTERECTOMY CAROTID-LEFT (Left) PATCH ANGIOPLASTY OF LEFT CAROTID ARTERY USING 1cm X 6cm Bovine Pericardial patch.  (Left)  Patient Location: PACU  Anesthesia Type:General  Level of Consciousness: awake, alert  and oriented  Airway & Oxygen Therapy: Patient Spontanous Breathing and Patient connected to nasal cannula oxygen  Post-op Assessment: Report given to PACU RN, Post -op Vital signs reviewed and stable and Patient moving all extremities X 4  Post vital signs: Reviewed and stable  Complications: No apparent anesthesia complications

## 2013-01-08 NOTE — Progress Notes (Signed)
Utilization review completed.  

## 2013-01-08 NOTE — Progress Notes (Signed)
Inpatient Diabetes Program Recommendations  AACE/ADA: New Consensus Statement on Inpatient Glycemic Control (2013)  Target Ranges:  Prepandial:   less than 140 mg/dL      Peak postprandial:   less than 180 mg/dL (1-2 hours)      Critically ill patients:  140 - 180 mg/dL    Results for [name], [name] (MRN [id number]) as of 01/08/2013 13:44  Ref. Range 01/08/2013 06:08 01/08/2013 10:41  Glucose-Capillary Latest Range: 70-99 mg/dL 295 (H) 621 (H)    Results for [name], [name] (MRN [id number]) as of 01/08/2013 13:44  Ref. Range 12/03/2012 00:05  Hemoglobin A1C Latest Range: <5.7 % 7.0 (H)    **Patient s/p endarectomy today.  Has history of DM Type 2.    **Per records, patient takes the following medications for DM control at home: Metformin 500 mg bid 70/30 insulin- 20 units bid with breakfast and supper  **Called Clinton Gallant, [place] for Vascular.  Got order to give only 1/2 home dose of 70/30 tonight with supper (10 units) if patient does not eat well.  Resume full home dose of 70/30 insulin tomorrow morning (12/05).    **Patient well controlled at home as evidenced by A1c of 7% (12/03/12).     Will follow. Ambrose Finland RN, MSN, CDE Diabetes Coordinator Inpatient Diabetes Program Team Pager: [phone] (8a-10p)

## 2013-01-08 NOTE — Progress Notes (Signed)
pts bp dropping to 80sys dr Jean Rosenthal notifed orders obtained and carried out

## 2013-01-08 NOTE — H&P (View-Only) (Signed)
[facility] [place]   Patient name: [name] MRN: [id number] DOB: 09/21/1955 Sex: female   Referred by: Hospitalist   Reason for referral:  Chief Complaint  Patient presents with  . Carotid    new pt, carotid     HISTORY OF PRESENT ILLNESS: This is a very pleasant 57-year-old female who was referred today for evaluation of a high-grade left carotid stenosis.  The patient initially presented to the hospital approximately 2 weeks ago with left arm tingling and slurred speech.  She was found to be very hypertensive and having significant headaches.  A full workup revealed a right brain stroke.  During her workup she was found to have a greater than 80% left carotid stenosis.  She is here today to discuss the left carotid stenosis.  She has no residual symptoms from her stroke.  She denies numbness or weakness in either extremity she denies slurred speech since her stent, she denies amaurosis fugax.  She did have an episode of her right hand drawing up and pain about 3 days ago but this resolved.  The patient has a history of coronary artery disease.  She had a heart attack in 2000.  This was treated medically.  She has a history of tobacco abuse but quit in 2003.  The patient is a diabetic.  Her blood sugars usually run around 122.  She does suffer from peripheral neuropathy secondary to her diabetes.  Her cholesterol is managed with a statin  Past Medical History  Diagnosis Date  . Diabetes mellitus   . Hypertension   . Myocardial infarction 2000  . Chronic pain following surgery or procedure 2008    Abdominal pain  . CAD (coronary artery disease)   . Stroke   . Carotid artery occlusion     Past Surgical History  Procedure Laterality Date  . Hernia repair  2008  . Abdominal hysterectomy  1999    partial  . Abdominal hysterectomy  2000    complete  . Cesarean section       X  2  . Knee arthroscopy      Left  . Abdominal surgery    . Bowel obstruction       History   Social History  . Marital Status: Widowed    Spouse Name: N/A    Number of Children: N/A  . Years of Education: N/A   Occupational History  . Not on file.   Social History Main Topics  . Smoking status: Former Smoker    Types: Cigarettes    Quit date: 02/05/2001  . Smokeless tobacco: Never Used  . Alcohol Use: 1.2 oz/week    2 Cans of beer per week     Comment: rarely  . Drug Use: No  . Sexual Activity: Not on file   Other Topics Concern  . Not on file   Social History Narrative  . No narrative on file    Family History  Problem Relation Age of Onset  . CAD Brother      X 2  . Diabetes Brother   . Heart disease Brother     before age 60  . Hyperlipidemia Brother   . Hypertension Brother   . Heart attack Brother   . CAD Sister      X 1  . Cancer Sister   . Diabetes Sister   . Heart disease Sister     before age 60  . Hyperlipidemia Sister   .   Hypertension Sister   . Hypertension      ALL  . Diabetes Mother   . Heart disease Mother   . Hyperlipidemia Mother   . Hypertension Mother   . Heart disease Father   . Hyperlipidemia Father   . Hypertension Father   . Hypertension Daughter     Allergies as of 12/15/2012 - Review Complete 12/15/2012  Allergen Reaction Noted  . Cymbalta [duloxetine hcl] Other (See Comments) 12/13/2011  . Penicillins Other (See Comments) 05/06/2011  . Tramadol  12/13/2011  . Trazodone and nefazodone  12/13/2011    Current Outpatient Prescriptions on File Prior to Visit  Medication Sig Dispense Refill  . aspirin EC 325 MG EC tablet Take 1 tablet (325 mg total) by mouth daily.  30 tablet  0  . gabapentin (NEURONTIN) 300 MG capsule Take 300 mg by mouth 2 (two) times daily.      . insulin NPH (HUMULIN N,NOVOLIN N) 100 UNIT/ML injection Inject 20 Units into the skin.       . lisinopril (PRINIVIL) 20 MG tablet Take 1 tablet (20 mg total) by mouth daily.  30 tablet  0  . metFORMIN (GLUCOPHAGE) 500 MG tablet Take  500 mg by mouth 2 (two) times daily with a meal.      . potassium chloride (K-DUR) 10 MEQ tablet Take 10 mEq by mouth daily.      . rosuvastatin (CRESTOR) 40 MG tablet Take 40 mg by mouth daily.      . atenolol (TENORMIN) 100 MG tablet Take 100 mg by mouth daily.       No current facility-administered medications on file prior to visit.     REVIEW OF SYSTEMS: Cardiovascular: Positive for shortness of breath on lying flat come with exertion, positive for pain in legs with walking and when lying flat. Pulmonary: No productive cough, asthma or wheezing. Neurologic: No weakness, paresthesias, aphasia, or amaurosis. No dizziness. Hematologic: No bleeding problems or clotting disorders. Musculoskeletal: No joint pain or joint swelling. Gastrointestinal: No blood in stool or hematemesis Genitourinary: No dysuria or hematuria. Psychiatric:: No history of major depression. Integumentary: No rashes or ulcers. Constitutional: No fever or chills.  PHYSICAL EXAMINATION: General: The patient appears their stated age.  Vital signs are BP 126/70  Pulse 56  Ht 5' 5" (1.651 m)  Wt 222 lb (100.699 kg)  BMI 36.94 kg/m2  SpO2 98% HEENT:  No gross abnormalities Pulmonary: Respirations are non-labored Abdomen: Soft and non-tender  Musculoskeletal: There are no major deformities.   Neurologic: No focal weakness or paresthesias are detected, Skin: There are no ulcer or rashes noted. Psychiatric: The patient has normal affect. Cardiovascular: There is a regular rate and rhythm without significant murmur appreciated.  No carotid bruits  Diagnostic Studies: I have reviewed her outside ultrasound which shows 80-99% stenosis on the left carotid artery.  There is minimal disease on the right   Assessment:  Asymptomatic left carotid stenosis Plan: I discussed the treatment options with the patient and her daughter which include medical management, surgical endarterectomy, and carotid stenting.  After  well-informed discussion, we have elected to proceed with a left carotid endarterectomy.  The risks and benefits of the operation were discussed.  These include but are not limited to the risk of stroke, nerve injury, cardiopulmonary complications.  I would like to give her additional time to recover from her recent right brain stroke.  Therefore I have scheduled her operation for Thursday, December 4.  I am going to order   a Myoview to evaluate her cardiac risk.     V. Wells Brabham IV, M.D. [facility] [place] Office: [phone] Pager:  [phone]   

## 2013-01-08 NOTE — Interval H&P Note (Signed)
History and Physical Interval Note:  01/08/2013 7:18 AM  [name]  has presented today for surgery, with the diagnosis of Left Internal Carotid Artery Stenosis  The various methods of treatment have been discussed with the patient and family. After consideration of risks, benefits and other options for treatment, the patient has consented to  Procedure(s): ENDARTERECTOMY CAROTID-LEFT (Left) as a surgical intervention .  The patient's history has been reviewed, patient examined, no change in status, stable for surgery.  I have reviewed the patient's chart and labs.  Questions were answered to the patient's satisfaction.     BRABHAM IV, V. WELLS

## 2013-01-08 NOTE — Anesthesia Procedure Notes (Signed)
Procedure Name: Intubation Date/Time: 01/08/2013 7:40 AM Performed by: Elon Alas Pre-anesthesia Checklist: Patient identified, Timeout performed, Emergency Drugs available, Suction available and Patient being monitored Patient Re-evaluated:Patient Re-evaluated prior to inductionOxygen Delivery Method: Circle system utilized Preoxygenation: Pre-oxygenation with 100% oxygen Intubation Type: IV induction Ventilation: Mask ventilation with difficulty Laryngoscope Size: Miller and 2 Grade View: Grade I Tube type: Oral Tube size: 7.5 mm Number of attempts: 1 Airway Equipment and Method: Stylet Placement Confirmation: positive ETCO2,  ETT inserted through vocal cords under direct vision and breath sounds checked- equal and bilateral Secured at: 22 cm Tube secured with: Tape Dental Injury: Teeth and Oropharynx as per pre-operative assessment

## 2013-01-08 NOTE — Progress Notes (Signed)
When patient is awake she complains of pain however is able to rest easily and sleep when pain med is given.  She describes her pain in her neck 9/10 despite sleeping when pain med is given.

## 2013-01-08 NOTE — Preoperative (Signed)
Beta Blockers   Reason not to administer Beta Blockers:Not Applicable 

## 2013-01-08 NOTE — Anesthesia Postprocedure Evaluation (Signed)
  Anesthesia Post-op Note  Patient: [name]  Procedure(s) Performed: Procedure(s): ENDARTERECTOMY CAROTID-LEFT (Left) PATCH ANGIOPLASTY OF LEFT CAROTID ARTERY USING 1cm X 6cm Bovine Pericardial patch.  (Left)  Patient Location: PACU  Anesthesia Type:General  Level of Consciousness: awake, alert , oriented and patient cooperative  Airway and Oxygen Therapy: Patient Spontanous Breathing and Patient connected to nasal cannula oxygen  Post-op Pain: none  Post-op Assessment: Post-op Vital signs reviewed, Patient's Cardiovascular Status Stable, Respiratory Function Stable, Patent Airway, No signs of Nausea or vomiting and Pain level controlled  Post-op Vital Signs: Reviewed and stable  Complications: No apparent anesthesia complications

## 2013-01-08 NOTE — Progress Notes (Signed)
Dr Jean Rosenthal at bedside albumin infusing as ordered

## 2013-01-08 NOTE — Anesthesia Preprocedure Evaluation (Addendum)
Anesthesia Evaluation  Patient identified by MRN, date of birth, ID band Patient awake    Reviewed: Allergy & Precautions, H&P , NPO status , Patient's Chart, lab work & pertinent test results  Airway Mallampati: I TM Distance: >3 FB Neck ROM: Full    Dental  (+) Edentulous Upper and Edentulous Lower   Pulmonary shortness of breath, former smoker (quit '03),  breath sounds clear to auscultation  Pulmonary exam normal       Cardiovascular hypertension, Pt. on medications + CAD, + Past MI and + Peripheral Vascular Disease Rhythm:Regular Rate:Normal  Myocardial perfusion imaging 12/31/12 -  Overall Impression:  Low risk stress nuclear study with a fixed, small mild apical anterior and apical perfusion defect.  Given normal wall motion, this could be soft tissue attenuation.  However, cannot rule out prior infarction.  No evidence for ischemia. .   LV Ejection Fraction: 61%.  LV Wall Motion:  NL LV Function; NL Wall Motion    Neuro/Psych  Headaches, PSYCHIATRIC DISORDERS Anxiety TIA   GI/Hepatic negative GI ROS, Neg liver ROS,   Endo/Other  diabetes (glu 153), Insulin Dependent and Oral Hypoglycemic AgentsHypothyroidism Morbid obesity  Renal/GU Renal InsufficiencyRenal disease (creat 1.19)     Musculoskeletal   Abdominal (+) + obese,   Peds  Hematology   Anesthesia Other Findings   Reproductive/Obstetrics                        Anesthesia Physical Anesthesia Plan  ASA: III  Anesthesia Plan: General   Post-op Pain Management:    Induction: Intravenous  Airway Management Planned: Oral ETT  Additional Equipment: Arterial line  Intra-op Plan:   Post-operative Plan: Extubation in OR  Informed Consent: I have reviewed the patients History and Physical, chart, labs and discussed the procedure including the risks, benefits and alternatives for the proposed anesthesia with the patient or authorized  representative who has indicated his/her understanding and acceptance.   Dental advisory given  Plan Discussed with: CRNA and Surgeon  Anesthesia Plan Comments: (Plan routine monitors, A line, GETA)       Anesthesia Quick Evaluation

## 2013-01-08 NOTE — OR Nursing (Signed)
Patient was neuro intact at pre-op assessment (0705). Pt. Denies any deficits, numbness or tingling from recent CVA.

## 2013-01-08 NOTE — Op Note (Signed)
Patient name: [name] MRN: [id number] DOB: 21-Mar-1955 Sex: female  01/08/2013 Pre-operative Diagnosis: Asx   left carotid stenosis Post-operative diagnosis:  Same Surgeon:  Jorge Ny Assistants:  C. Edilia Bo, MD,  M.Collins, [place] Procedure:    left carotid Endarterectomy with bovine pericardial patch angioplasty Anesthesia:  General Blood Loss:  See anesthesia record Specimens:  Carotid Plaque to pathology  Findings:  80 %stenosis; Thrombus:  none  Indications:  57 yo with recent right brain CVA with no residual deficits.  U/S  Revealed >80% left ICA stenosis.  She was asx  Procedure:  The patient was identified in the holding area and taken to [facility] OR ROOM 11  The patient was then placed supine on the table.   General endotrachial anesthesia was administered.  The patient was prepped and draped in the usual sterile fashion.  A time out was called and antibiotics were administered.  The incision was made along the anterior border of the left sternocleidomastoid muscle.  Cautery was used to dissect through the subcutaneous tissue.  The platysma muscle was divided with cautery.  The internal jugular vein was exposed along its anterior medial border.  The common facial vein was exposed and then divided between 2-0 silk ties and metal clips.  The common carotid artery was then circumferentially exposed and encircled with an umbilical tape.  The vagus nerve was identified and protected.  Next sharp dissection was used to expose the external carotid artery and the superior thyroid artery.  The were encircled with a blue vessel loop and a 2-0 silk tie respectively.  Finally, the internal carotid was carefully dissected free.  An umbilical tape was placed around the internal carotid artery distal to the diseased segment.  The hypoglossal nerve was visualized throughout and protected.  The patient was given systemic heparinization.  A bovine carotid patch was selected and prepared on the back  table.  A 10 french shunt was also prepared.  After blood pressure readings were appropriate and the heparin had been given time to circulate, the internal carotid artery was occluded with a baby Gregory clamp.  The external and common carotid arteries were then occluded with vascular clamps and the 2-0 tie tightened on the superior thyroid artery.  A #11 blade was used to make an arteriotomy in the common carotid artery.  This was extended with Potts scissors along the anterior and lateral border of the common and internal carotid artery.  Approximately 80% stenosis was identified.  There was no thrombus identified.  The 8 french shunt was then placed.  A kleiner kuntz elevator was used to perform endarterectomy.  An eversion endarterectomy was performed in the external carotid artery.  A good distal endpoint was obtained in the internal carotid artery.  The specimen was removed and sent to pathology.  Heparinized saline was used to irrigate the endarterectomized field.  All potential embolic debris was removed.  Bovine pericardial patch angioplasty was then performed using a running 6-0 Prolene. Just prior to completion of the repair, the shunt was removed. The common internal and external carotid arteries were all appropriately flushed. The artery was again irrigated with heparin saline.  The anastomosis was then secured. The clamp was first released on the external carotid artery followed by the common carotid artery approximately 30 seconds later, bloodflow was reestablish through the internal carotid artery.  Next, a hand-held  Doppler was used to evaluate the signals in the common, external, and internal  carotid arteries,  all of which had appropriate signals. I then administered  50 mg protamine. The wound was then irrigated.  After hemostasis was achieved, the carotid sheath was reapproximated with 3-0 Vicryl. The  platysma muscle was reapproximated with running 3-0 Vicryl. The skin  was closed with  4-0 Vicryl. Dermabond was placed on the skin. The  patient was then successfully extubated. Her neurologic exam was  similar to his preprocedural exam. The patient was then taken to recovery room  in stable condition. There were no complications.     Disposition:  To PACU in stable condition.  Relevant Operative Details:  Multiple venous branches which had to be ligated.  Externally rotated carotid.  I ligated the superior thyroid for better exposure.  Small internal ICA requiring a 8 F shunt.  The shunt was in place for most of the case.  I had to remove it while sewing the patch because it was complicating the repair.  She had pulsatile backbleeding.    Juleen China, M.D. [facility] [place] Office: [phone] Pager:  [phone]

## 2013-01-09 ENCOUNTER — Encounter (HOSPITAL_COMMUNITY): Payer: Self-pay | Admitting: Surgery

## 2013-01-09 ENCOUNTER — Telehealth: Payer: Self-pay | Admitting: Surgery

## 2013-01-09 LAB — CBC
HCT: 35.4 % — ABNORMAL LOW (ref 36.0–46.0)
Hemoglobin: 11.8 g/dL — ABNORMAL LOW (ref 12.0–15.0)
MCV: 84.9 fL (ref 78.0–100.0)
Platelets: 189 10*3/uL (ref 150–400)
RBC: 4.17 MIL/uL (ref 3.87–5.11)
WBC: 6.2 10*3/uL (ref 4.0–10.5)

## 2013-01-09 LAB — HEMOGLOBIN A1C
Hgb A1c MFr Bld: 6.6 % — ABNORMAL HIGH (ref <5.7)
Mean Plasma Glucose: 143 mg/dL — ABNORMAL HIGH (ref <117)

## 2013-01-09 LAB — GLUCOSE, CAPILLARY
Glucose-Capillary: 167 mg/dL — ABNORMAL HIGH (ref 70–99)
Glucose-Capillary: 130 mg/dL — ABNORMAL HIGH (ref 70–99)

## 2013-01-09 LAB — BASIC METABOLIC PANEL
BUN: 13 mg/dL (ref 6–23)
CO2: 25 mEq/L (ref 19–32)
Chloride: 99 mEq/L (ref 96–112)
Creatinine, Ser: 0.86 mg/dL (ref 0.50–1.10)
Glucose, Bld: 150 mg/dL — ABNORMAL HIGH (ref 70–99)
Potassium: 3.4 mEq/L — ABNORMAL LOW (ref 3.5–5.1)

## 2013-01-09 MED ORDER — OXYCODONE-ACETAMINOPHEN 5-325 MG PO TABS: 1.0000 | ORAL_TABLET | Freq: Four times a day (QID) | ORAL | Status: DC | PRN

## 2013-01-09 NOTE — Telephone Encounter (Addendum)
Message copied by Fredrich Birks on Fri Jan 09, 2013  3:17 PM ------      Message from: [place], [place] M      Created: Fri Jan 09, 2013  1:41 PM       Carotid surgery f/u with Dr. Myra Gianotti in 2 weeks ------  01/09/13: spoke with pt to schedule, dpm

## 2013-01-09 NOTE — Progress Notes (Signed)
Discharge instructions explained and discussed with pt. Prescriptions, f/u appt. And exit care notes on after care of carotid endarectomy given. Pt going homw with daugther.

## 2013-01-09 NOTE — Progress Notes (Signed)
[facility] [place]  Subjective  - "my throat is sore."   Objective 154/83 61 98.8 F (37.1 C) (Oral) 14 97%  Intake/Output Summary (Last 24 hours) at 01/09/13 0723 Last data filed at 01/08/13 2200  Gross per 24 hour  Intake 2173.33 ml  Output   1000 ml  Net 1173.33 ml    Facial droop at rest and speech gargle.  Symmetrical facial expression with full smile. Radial pulses palpable and equal bil. Incision is clean and dry  Assessment/Planning: POD #1 Carotid Endarterectomy  Left She has ambulated and voided this am Slurred speech she was given benadryl and percocet this am.  This could be a factor.   Plan D/C home today.     Clinton Gallant [facility] 01/09/2013 7:23 AM --  Laboratory Lab Results:  Recent Labs  01/08/13 1300 01/09/13 0345  WBC 9.9 6.2  HGB 12.1 11.8*  HCT 36.3 35.4*  PLT 207 189   BMET  Recent Labs  01/08/13 1300 01/09/13 0345  NA  --  137  K  --  3.4*  CL  --  99  CO2  --  25  GLUCOSE  --  150*  BUN  --  13  CREATININE 0.96 0.86  CALCIUM  --  8.7    COAG Lab Results  Component Value Date   INR 0.95 12/29/2012   INR 0.95 12/02/2012   No results found for this basename: PTT

## 2013-01-09 NOTE — Progress Notes (Addendum)
Pt c/o itching. She stated that she gets benadryl when she is given morphine. MD was called and gave verbal orders. Will continue to monitor.

## 2013-01-12 ENCOUNTER — Encounter: Payer: Self-pay | Admitting: Surgery

## 2013-01-13 DIAGNOSIS — Z0279 Encounter for issue of other medical certificate: Secondary | ICD-10-CM

## 2013-01-13 NOTE — Discharge Summary (Signed)
[facility] Discharge Summary   Patient ID:  [name] MRN: [id number] DOB/AGE: 58-May-1957 58 y.o.  Admit date: 01/08/2013 Discharge date: 01/13/2013 Date of Surgery: 01/08/2013 Surgeon: Surgeon(s): Nada Libman, MD Chuck Hint, MD  Admission Diagnosis: Left Internal Carotid Artery Stenosis  Discharge Diagnoses:  Left Internal Carotid Artery Stenosis  Secondary Diagnoses: Past Medical History  Diagnosis Date  . Diabetes mellitus   . Myocardial infarction 2000  . Chronic pain following surgery or procedure 2008    Abdominal pain  . CAD (coronary artery disease)   . Carotid artery occlusion   . Hypothyroidism   . Anxiety   . Shortness of breath   . Headache(784.0)   . Hypertension     stress test- scheduled for 12/30/2012  . Stroke 11/2012    tingling in L arm , slurred speech- came to ER    Procedure(s): ENDARTERECTOMY CAROTID-LEFT PATCH ANGIOPLASTY OF LEFT CAROTID ARTERY USING 1cm X 6cm Bovine Pericardial patch.   Discharged Condition: good  HPI: This is a very pleasant 57 year old female who was referred today for evaluation of a high-grade left carotid stenosis. The patient initially presented to the hospital approximately 2 weeks ago with left arm tingling and slurred speech. She was found to be very hypertensive and having significant headaches. A full workup revealed a right brain stroke. During her workup she was found to have a greater than 80% left carotid stenosis. She is here today to discuss the left carotid stenosis. She has no residual symptoms from her stroke. She denies numbness or weakness in either extremity she denies slurred speech since her stent, she denies amaurosis fugax. She did have an episode of her right hand drawing up and pain about 3 days ago but this resolved.  The patient has a history of coronary artery disease. She had a heart attack in 2000. This was treated medically. She has a history of tobacco abuse  but quit in 2003. The patient is a diabetic. Her blood sugars usually run around 122. She does suffer from peripheral neuropathy secondary to her diabetes. Her cholesterol is managed with a statin  Left carotid Endarterectomy with bovine pericardial patch angioplasty was performed by Dr. Myra Gianotti on 01/08/2013.  Post-op she Facial droop at rest and speech gargle. Symmetrical facial expression with full smile. Radial pulses palpable and equal bil. The Incision is clean and dry.  The patient was taking PO's well and ambulating.  She will f/u in the office in 2 weeks.      Hospital Course:  AARIYA FERRICK is a 57 y.o. female is S/P Left Procedure(s): ENDARTERECTOMY CAROTID-LEFT PATCH ANGIOPLASTY OF LEFT CAROTID ARTERY USING 1cm X 6cm Bovine Pericardial patch.  Extubated: POD # 0 Physical exam: Facial droop at rest and speech gargle. Symmetrical facial expression with full smile.  Radial pulses palpable and equal bil.  Incision is clean and dry  Post-op wounds healing well Pt. Ambulating, voiding and taking PO diet without difficulty. Pt pain controlled with PO pain meds. Labs as below Complications:none  Consults:     Significant Diagnostic Studies: CBC Lab Results  Component Value Date   WBC 6.2 01/09/2013   HGB 11.8* 01/09/2013   HCT 35.4* 01/09/2013   MCV 84.9 01/09/2013   PLT 189 01/09/2013    BMET    Component Value Date/Time   NA 137 01/09/2013 0345   K 3.4* 01/09/2013 0345   CL 99 01/09/2013 0345   CO2 25 01/09/2013 0345  GLUCOSE 150* 01/09/2013 0345   BUN 13 01/09/2013 0345   CREATININE 0.86 01/09/2013 0345   CALCIUM 8.7 01/09/2013 0345   GFRNONAA 73* 01/09/2013 0345   GFRAA 85* 01/09/2013 0345   COAG Lab Results  Component Value Date   INR 0.95 12/29/2012   INR 0.95 12/02/2012     Disposition:  Discharge to :Home Discharge Orders   Future Appointments Provider Department Dept Phone   02/02/2013 8:30 AM Nada Libman, MD [facility]  [phone]   Future Orders Complete By Expires   Call MD for:  redness, tenderness, or signs of infection (pain, swelling, bleeding, redness, odor or green/yellow discharge around incision site)  As directed    Call MD for:  severe or increased pain, loss or decreased feeling  in affected limb(s)  As directed    Call MD for:  temperature >100.5  As directed    Discharge patient  As directed    Comments:     Discharge pt to home   Driving Restrictions  As directed    Comments:     No driving for 2 weeks   Increase activity slowly  As directed    Comments:     Walk with assistance use walker or cane as needed   Lifting restrictions  As directed    Comments:     No lifting for 6 weeks   May shower   As directed    Resume previous diet  As directed        Medication List         aspirin EC 81 MG tablet  Take 81 mg by mouth daily.     atenolol-chlorthalidone 100-25 MG per tablet  Commonly known as:  TENORETIC  Take 1 tablet by mouth daily.     diclofenac 25 MG EC tablet  Commonly known as:  VOLTAREN  Take 25 mg by mouth 2 (two) times daily.     gabapentin 300 MG capsule  Commonly known as:  NEURONTIN  Take 300 mg by mouth 2 (two) times daily.     levothyroxine 100 MCG tablet  Commonly known as:  SYNTHROID, LEVOTHROID  Take 100 mcg by mouth daily before breakfast.     lisinopril 20 MG tablet  Commonly known as:  PRINIVIL  Take 1 tablet (20 mg total) by mouth daily.     metFORMIN 500 MG tablet  Commonly known as:  GLUCOPHAGE  Take 500 mg by mouth 2 (two) times daily with a meal.     NOVOLIN 70/30 (70-30) 100 UNIT/ML injection  Generic drug:  insulin NPH-regular  Inject 20 Units into the skin 2 (two) times daily.     oxyCODONE-acetaminophen 5-325 MG per tablet  Commonly known as:  PERCOCET/ROXICET  Take 1 tablet by mouth every 6 (six) hours as needed for moderate pain.     potassium chloride 10 MEQ tablet  Commonly known as:  K-DUR,KLOR-CON  Take 10 mEq by  mouth 2 (two) times daily.     rosuvastatin 20 MG tablet  Commonly known as:  CRESTOR  Take 20 mg by mouth daily with breakfast.       Verbal and written Discharge instructions given to the patient. Wound care per Discharge AVS     Follow-up Information   Follow up with Myra Gianotti IV, Lala Lund, MD In 2 weeks.   Specialty:  Vascular Surgery   Contact information:   [address] [place] [address] [phone]  SignedMosetta Pigeon 01/13/2013, 10:59 AM  --- For VQI Registry use --- Instructions: Press F2 to tab through selections.  Delete question if not applicable.   Modified Rankin score at D/C (0-6): Rankin Score=0  IV medication needed for:  1. Hypertension: Yes 2. Hypotension: No  Post-op Complications: No  1. Post-op CVA or TIA: No  If yes: Event classification (right eye, left eye, right cortical, left cortical, verterobasilar, other):   If yes: Timing of event (intra-op, <6 hrs post-op, >=6 hrs post-op, unknown):   2. CN injury: No  If yes: CN  injuried   3. Myocardial infarction: No  If yes: Dx by (EKG or clinical, Troponin):   4.  CHF: No  5.  Dysrhythmia (new): No  6. Wound infection: No  7. Reperfusion symptoms: No  8. Return to OR: No  If yes: return to OR for (bleeding, neurologic, other CEA incision, other):   Discharge medications: Statin use:  Yes ASA use:  Yes Beta blocker use:  No  for medical reason   ACE-Inhibitor use:  No  for medical reason   P2Y12 Antagonist use: [ ]  None, [ ]  Plavix, [ ]  Plasugrel, [ ]  Ticlopinine, [ ]  Ticagrelor, [ ]  Other, [ ]  No for medical reason, [ ]  Non-compliant, [ ]  Not-indicated Anti-coagulant use:  [x]  None, [ ]  Warfarin, [ ]  Rivaroxaban, [ ]  Dabigatran, [ ]  Other, [ ]  No for medical reason, [ ]  Non-compliant, [ ]  Not-indicated

## 2013-01-18 NOTE — Discharge Summary (Signed)
I agree with the above  Wells Marrio Scribner 

## 2013-01-28 ENCOUNTER — Encounter: Payer: Self-pay | Admitting: Surgery

## 2013-01-30 ENCOUNTER — Telehealth: Payer: Self-pay

## 2013-01-30 NOTE — Telephone Encounter (Signed)
Phone call from pt.  Requests refill on her Percocet.  Stated she took her last pill last night.  Denies fever/ chills.  Stated she has a small amt. of pink drainage from her neck incision.  Stated there is a dry scab on the neck incision, at this time.  Questioned about any redness or warmth around the incision;  stated that there is a "darkness around the incision, like a shadow."   Reports that she can't sleep at night.  Rates pain at "9".   Discussed w/ Dr. Imogene Burn.  Advised he cannot authorize pain medication without seeing pt. in office.  Pt. unable to come to office today.  Reports she has appt. on Mon., 02/02/13 with Dr. Myra Gianotti.  Advised pt. to try ES Tylenol or Ibuprofen over the counter, for pain.  Also, suggested she try Benadryl, over the counter, to help her sleep at night.  Pt. verb. understanding.

## 2013-02-02 ENCOUNTER — Ambulatory Visit (INDEPENDENT_AMBULATORY_CARE_PROVIDER_SITE_OTHER): Payer: Managed Care, Other (non HMO) | Admitting: Surgery

## 2013-02-02 ENCOUNTER — Encounter: Payer: Self-pay | Admitting: Surgery

## 2013-02-02 DIAGNOSIS — I6529 Occlusion and stenosis of unspecified carotid artery: Secondary | ICD-10-CM

## 2013-02-02 NOTE — Progress Notes (Signed)
This is the patient's first postoperative visit.  She is status post left carotid endarterectomy on 01/08/2013.  This was done for asymptomatic, greater than 80% stenosis as detected by ultrasound.  She had recently suffered a right brain stroke.  She has completely recovered from her right brain stroke.  She did not have significant right carotid stenosis.  The patient's postoperative course was uncomplicated.  She has no neurologic deficits.  She does complain of tenderness around her incision.  On examination, she is neurologically intact.  The lower half of her incision has separated slightly.  It is very tender to touch.  I probed this with a Q-tip.  There were no tracts or tunnels.  There were no fluid collections.  There is no significant erythema.  Overall, she is doing very well.  She does have a superficial wound separation.  I have told her to keep this area dry and covered as she is having a small amount of drainage.  I am also placing her on Levaquin for 10 days.  She will follow up with me in 3 weeks.  I also gave her 40 oxycodone

## 2013-02-03 ENCOUNTER — Encounter: Payer: Self-pay | Admitting: *Deleted

## 2013-02-20 ENCOUNTER — Encounter: Payer: Self-pay | Admitting: Surgery

## 2013-02-23 ENCOUNTER — Ambulatory Visit: Payer: Managed Care, Other (non HMO) | Admitting: Surgery

## 2013-02-27 ENCOUNTER — Encounter: Payer: Self-pay | Admitting: Surgery

## 2013-03-02 ENCOUNTER — Ambulatory Visit (INDEPENDENT_AMBULATORY_CARE_PROVIDER_SITE_OTHER): Payer: Managed Care, Other (non HMO) | Admitting: Surgery

## 2013-03-02 ENCOUNTER — Encounter: Payer: Self-pay | Admitting: Surgery

## 2013-03-02 VITALS — BP 141/76 | HR 70 | Temp 99.5°F | Ht 65.0 in | Wt 224.0 lb

## 2013-03-02 DIAGNOSIS — I6529 Occlusion and stenosis of unspecified carotid artery: Secondary | ICD-10-CM

## 2013-03-02 DIAGNOSIS — Z48812 Encounter for surgical aftercare following surgery on the circulatory system: Secondary | ICD-10-CM

## 2013-03-02 NOTE — Progress Notes (Signed)
This is the patient's first postoperative followup.  During a workup for a right brain stroke secondary to hypertension, she was found to have a high-grade left carotid stenosis during her stroke workup.  On December 4, she underwent left carotid endarterectomy with bovine pericardial patch angioplasty.  Intraoperative findings included a very small internal carotid artery with 80% stenosis.  Her postoperative course was uncomplicated.  She is back today without complaints other than some hyperesthesia at her incision site.  She is neurologically intact.  Her incision has healed nicely.  The patient will followup in 6 months with a repeat carotid ultrasound.

## 2013-03-02 NOTE — Addendum Note (Signed)
Addended by: Adria DillELDRIDGE-LEWIS, Tameca Jerez L on: 03/02/2013 10:48 AM   Modules accepted: Orders

## 2013-03-05 ENCOUNTER — Emergency Department (INDEPENDENT_AMBULATORY_CARE_PROVIDER_SITE_OTHER)
Admission: EM | Admit: 2013-03-05 | Discharge: 2013-03-05 | Disposition: A | Discharge status: Home / Self Care | Payer: Managed Care, Other (non HMO) | Source: Home / Self Care | Attending: Emergency Medicine | Admitting: Emergency Medicine

## 2013-03-05 ENCOUNTER — Encounter (HOSPITAL_COMMUNITY): Payer: Self-pay | Admitting: Emergency Medicine

## 2013-03-05 ENCOUNTER — Emergency Department (INDEPENDENT_AMBULATORY_CARE_PROVIDER_SITE_OTHER): Payer: Managed Care, Other (non HMO)

## 2013-03-05 DIAGNOSIS — J069 Acute upper respiratory infection, unspecified: Secondary | ICD-10-CM

## 2013-03-05 MED ORDER — ALBUTEROL SULFATE HFA 108 (90 BASE) MCG/ACT IN AERS: 1.0000 | INHALATION_SPRAY | Freq: Four times a day (QID) | RESPIRATORY_TRACT | Status: DC | PRN

## 2013-03-05 MED ORDER — GUAIFENESIN-CODEINE 100-10 MG/5ML PO SOLN: 10.0000 mL | Freq: Every evening | ORAL | Status: DC | PRN

## 2013-03-05 MED ORDER — ALBUTEROL SULFATE (2.5 MG/3ML) 0.083% IN NEBU
INHALATION_SOLUTION | RESPIRATORY_TRACT | Status: AC
  Filled 2013-03-05: qty 6

## 2013-03-05 MED ORDER — DEXTROMETHORPHAN POLISTIREX 30 MG/5ML PO LQCR: 30.0000 mg | Freq: Every day | ORAL | Status: DC

## 2013-03-05 MED ORDER — IPRATROPIUM BROMIDE 0.06 % NA SOLN
2.0000 | Freq: Four times a day (QID) | NASAL | Status: DC
Start: 2013-03-05 — End: 2013-05-25

## 2013-03-05 MED ORDER — ALBUTEROL SULFATE (2.5 MG/3ML) 0.083% IN NEBU
5.0000 mg | INHALATION_SOLUTION | Freq: Once | RESPIRATORY_TRACT | Status: AC
  Administered 2013-03-05: 5 mg via RESPIRATORY_TRACT

## 2013-03-05 MED ORDER — IPRATROPIUM BROMIDE 0.02 % IN SOLN
RESPIRATORY_TRACT | Status: AC
  Filled 2013-03-05: qty 2.5

## 2013-03-05 MED ORDER — IPRATROPIUM BROMIDE 0.02 % IN SOLN
0.5000 mg | Freq: Once | RESPIRATORY_TRACT | Status: AC
  Administered 2013-03-05: 0.5 mg via RESPIRATORY_TRACT

## 2013-03-05 NOTE — ED Notes (Signed)
Pt  Reports  Symptoms  Of  Cough   Congested        Pain in  Her chest  From  Coughing      Pt  Reports        otc  meds  And  Home  remeidies  Not  Working

## 2013-03-05 NOTE — ED Provider Notes (Signed)
Medical screening examination/treatment/procedure(s) were performed by non-physician practitioner and as supervising physician I was immediately available for consultation/collaboration.  Joretta Eads, M.D.   Dhiren Azimi C Rasheka Denard, MD 03/05/13 1905 

## 2013-03-05 NOTE — Discharge Instructions (Signed)
Your chest xray was normal and without evidence of bronchitis or pneumonia. Please use medications as directed and if symptoms do not begin to improve over the next several days or you develop fever, please follow up with your doctor.
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
## 2013-05-01 ENCOUNTER — Other Ambulatory Visit: Payer: Self-pay

## 2013-05-01 DIAGNOSIS — Z1231 Encounter for screening mammogram for malignant neoplasm of breast: Secondary | ICD-10-CM

## 2013-05-25 ENCOUNTER — Encounter (HOSPITAL_COMMUNITY): Payer: Self-pay | Admitting: Emergency Medicine

## 2013-05-25 ENCOUNTER — Other Ambulatory Visit: Payer: Self-pay

## 2013-05-25 ENCOUNTER — Ambulatory Visit
Admission: RE | Admit: 2013-05-25 | Discharge: 2013-05-25 | Disposition: A | Discharge status: Home / Self Care | Payer: Managed Care, Other (non HMO) | Source: Ambulatory Visit

## 2013-05-25 ENCOUNTER — Emergency Department (HOSPITAL_COMMUNITY)
Admission: EM | Admit: 2013-05-25 | Discharge: 2013-05-26 | Disposition: A | Discharge status: Home / Self Care | Payer: Managed Care, Other (non HMO) | Attending: Emergency Medicine | Admitting: Emergency Medicine

## 2013-05-25 ENCOUNTER — Emergency Department (HOSPITAL_COMMUNITY): Payer: Managed Care, Other (non HMO)

## 2013-05-25 DIAGNOSIS — E039 Hypothyroidism, unspecified: Secondary | ICD-10-CM | POA: Insufficient documentation

## 2013-05-25 DIAGNOSIS — Z8673 Personal history of transient ischemic attack (TIA), and cerebral infarction without residual deficits: Secondary | ICD-10-CM | POA: Insufficient documentation

## 2013-05-25 DIAGNOSIS — I2510 Atherosclerotic heart disease of native coronary artery without angina pectoris: Secondary | ICD-10-CM | POA: Insufficient documentation

## 2013-05-25 DIAGNOSIS — F411 Generalized anxiety disorder: Secondary | ICD-10-CM | POA: Insufficient documentation

## 2013-05-25 DIAGNOSIS — Z1231 Encounter for screening mammogram for malignant neoplasm of breast: Secondary | ICD-10-CM

## 2013-05-25 DIAGNOSIS — Z880 Allergy status to penicillin: Secondary | ICD-10-CM | POA: Insufficient documentation

## 2013-05-25 DIAGNOSIS — Z87891 Personal history of nicotine dependence: Secondary | ICD-10-CM | POA: Insufficient documentation

## 2013-05-25 DIAGNOSIS — Z79899 Other long term (current) drug therapy: Secondary | ICD-10-CM | POA: Insufficient documentation

## 2013-05-25 DIAGNOSIS — G8928 Other chronic postprocedural pain: Secondary | ICD-10-CM | POA: Insufficient documentation

## 2013-05-25 DIAGNOSIS — R519 Headache, unspecified: Secondary | ICD-10-CM

## 2013-05-25 DIAGNOSIS — Z9861 Coronary angioplasty status: Secondary | ICD-10-CM | POA: Insufficient documentation

## 2013-05-25 DIAGNOSIS — E669 Obesity, unspecified: Secondary | ICD-10-CM | POA: Insufficient documentation

## 2013-05-25 DIAGNOSIS — I252 Old myocardial infarction: Secondary | ICD-10-CM | POA: Insufficient documentation

## 2013-05-25 DIAGNOSIS — Z791 Long term (current) use of non-steroidal anti-inflammatories (NSAID): Secondary | ICD-10-CM | POA: Insufficient documentation

## 2013-05-25 DIAGNOSIS — Z794 Long term (current) use of insulin: Secondary | ICD-10-CM | POA: Insufficient documentation

## 2013-05-25 DIAGNOSIS — R51 Headache: Secondary | ICD-10-CM | POA: Insufficient documentation

## 2013-05-25 DIAGNOSIS — I10 Essential (primary) hypertension: Secondary | ICD-10-CM

## 2013-05-25 DIAGNOSIS — Z7982 Long term (current) use of aspirin: Secondary | ICD-10-CM | POA: Insufficient documentation

## 2013-05-25 LAB — COMPREHENSIVE METABOLIC PANEL
ALT: 26 U/L (ref 0–35)
AST: 33 U/L (ref 0–37)
Albumin: 3.9 g/dL (ref 3.5–5.2)
Alkaline Phosphatase: 101 U/L (ref 39–117)
BUN: 21 mg/dL (ref 6–23)
CALCIUM: 10.1 mg/dL (ref 8.4–10.5)
CO2: 24 mEq/L (ref 19–32)
CREATININE: 1.03 mg/dL (ref 0.50–1.10)
Chloride: 99 mEq/L (ref 96–112)
GFR calc Af Amer: 68 mL/min — ABNORMAL LOW (ref >90)
GFR, EST NON AFRICAN AMERICAN: 59 mL/min — AB (ref >90)
Glucose, Bld: 157 mg/dL — ABNORMAL HIGH (ref 70–99)
Potassium: 4.2 mEq/L (ref 3.7–5.3)
Sodium: 139 mEq/L (ref 137–147)
TOTAL PROTEIN: 8.2 g/dL (ref 6.0–8.3)
Total Bilirubin: 0.4 mg/dL (ref 0.3–1.2)

## 2013-05-25 LAB — CBC
HEMATOCRIT: 42.6 % (ref 36.0–46.0)
Hemoglobin: 14.7 g/dL (ref 12.0–15.0)
MCH: 28.5 pg (ref 26.0–34.0)
MCHC: 34.5 g/dL (ref 30.0–36.0)
MCV: 82.7 fL (ref 78.0–100.0)
Platelets: 250 10*3/uL (ref 150–400)
RBC: 5.15 MIL/uL — ABNORMAL HIGH (ref 3.87–5.11)
RDW: 14.4 % (ref 11.5–15.5)
WBC: 5.7 10*3/uL (ref 4.0–10.5)

## 2013-05-25 LAB — DIFFERENTIAL
BASOS ABS: 0 10*3/uL (ref 0.0–0.1)
BASOS PCT: 0 % (ref 0–1)
EOS ABS: 0.3 10*3/uL (ref 0.0–0.7)
EOS PCT: 5 % (ref 0–5)
LYMPHS PCT: 53 % — AB (ref 12–46)
Lymphs Abs: 3 10*3/uL (ref 0.7–4.0)
MONO ABS: 0.3 10*3/uL (ref 0.1–1.0)
Monocytes Relative: 6 % (ref 3–12)
Neutro Abs: 2.1 10*3/uL (ref 1.7–7.7)
Neutrophils Relative %: 36 % — ABNORMAL LOW (ref 43–77)

## 2013-05-25 LAB — CBG MONITORING, ED: Glucose-Capillary: 123 mg/dL — ABNORMAL HIGH (ref 70–99)

## 2013-05-25 LAB — PROTIME-INR
INR: 0.9 (ref 0.00–1.49)
Prothrombin Time: 12 seconds (ref 11.6–15.2)

## 2013-05-25 LAB — I-STAT TROPONIN, ED: Troponin i, poc: 0 ng/mL (ref 0.00–0.08)

## 2013-05-25 LAB — APTT: aPTT: 32 seconds (ref 24–37)

## 2013-05-25 MED ORDER — LABETALOL HCL 5 MG/ML IV SOLN
20.0000 mg | INTRAVENOUS | Status: DC | PRN
  Administered 2013-05-25: 20 mg via INTRAVENOUS
  Filled 2013-05-25: qty 4

## 2013-05-25 MED ORDER — MORPHINE SULFATE 4 MG/ML IJ SOLN
4.0000 mg | INTRAMUSCULAR | Status: DC | PRN
  Administered 2013-05-25: 4 mg via INTRAVENOUS
  Filled 2013-05-25: qty 1

## 2013-05-25 MED ORDER — METOCLOPRAMIDE HCL 5 MG/ML IJ SOLN
10.0000 mg | Freq: Once | INTRAMUSCULAR | Status: AC
  Administered 2013-05-25: 10 mg via INTRAVENOUS
  Filled 2013-05-25: qty 2

## 2013-05-25 NOTE — ED Notes (Signed)
Pt here today for elevated blood pressure, headache, light headedness and dizziness, slurred speech. Per daughter sx started around 2140. Per daughter in November pt had a mini stroke. Pt rates headache 10/10. Pt takes atenolol w HCTZ, and lisinipril. Pt states she took her medications today.
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
## 2013-05-26 NOTE — ED Notes (Signed)
Discharge and follow up instructions reviewed. Pt verbalized understanding.  

## 2013-05-26 NOTE — Discharge Instructions (Signed)

## 2013-05-27 ENCOUNTER — Encounter (HOSPITAL_COMMUNITY): Payer: Self-pay | Admitting: Emergency Medicine

## 2013-05-27 ENCOUNTER — Emergency Department (HOSPITAL_COMMUNITY): Payer: Managed Care, Other (non HMO)

## 2013-05-27 ENCOUNTER — Emergency Department (HOSPITAL_COMMUNITY)
Admission: EM | Admit: 2013-05-27 | Discharge: 2013-05-27 | Disposition: A | Discharge status: Home / Self Care | Payer: Managed Care, Other (non HMO) | Attending: Emergency Medicine | Admitting: Emergency Medicine

## 2013-05-27 DIAGNOSIS — Z8659 Personal history of other mental and behavioral disorders: Secondary | ICD-10-CM | POA: Insufficient documentation

## 2013-05-27 DIAGNOSIS — Z79899 Other long term (current) drug therapy: Secondary | ICD-10-CM | POA: Insufficient documentation

## 2013-05-27 DIAGNOSIS — I252 Old myocardial infarction: Secondary | ICD-10-CM | POA: Insufficient documentation

## 2013-05-27 DIAGNOSIS — Z8673 Personal history of transient ischemic attack (TIA), and cerebral infarction without residual deficits: Secondary | ICD-10-CM | POA: Insufficient documentation

## 2013-05-27 DIAGNOSIS — K5900 Constipation, unspecified: Secondary | ICD-10-CM | POA: Insufficient documentation

## 2013-05-27 DIAGNOSIS — R110 Nausea: Secondary | ICD-10-CM | POA: Insufficient documentation

## 2013-05-27 DIAGNOSIS — I2510 Atherosclerotic heart disease of native coronary artery without angina pectoris: Secondary | ICD-10-CM | POA: Insufficient documentation

## 2013-05-27 DIAGNOSIS — G8929 Other chronic pain: Secondary | ICD-10-CM | POA: Insufficient documentation

## 2013-05-27 DIAGNOSIS — Z791 Long term (current) use of non-steroidal anti-inflammatories (NSAID): Secondary | ICD-10-CM | POA: Insufficient documentation

## 2013-05-27 DIAGNOSIS — I10 Essential (primary) hypertension: Secondary | ICD-10-CM | POA: Insufficient documentation

## 2013-05-27 DIAGNOSIS — R109 Unspecified abdominal pain: Secondary | ICD-10-CM | POA: Insufficient documentation

## 2013-05-27 DIAGNOSIS — Z90710 Acquired absence of both cervix and uterus: Secondary | ICD-10-CM | POA: Insufficient documentation

## 2013-05-27 DIAGNOSIS — Z7982 Long term (current) use of aspirin: Secondary | ICD-10-CM | POA: Insufficient documentation

## 2013-05-27 DIAGNOSIS — Z87891 Personal history of nicotine dependence: Secondary | ICD-10-CM | POA: Insufficient documentation

## 2013-05-27 DIAGNOSIS — E119 Type 2 diabetes mellitus without complications: Secondary | ICD-10-CM | POA: Insufficient documentation

## 2013-05-27 DIAGNOSIS — E039 Hypothyroidism, unspecified: Secondary | ICD-10-CM | POA: Insufficient documentation

## 2013-05-27 LAB — CBC WITH DIFFERENTIAL/PLATELET
BASOS ABS: 0 10*3/uL (ref 0.0–0.1)
Basophils Relative: 0 % (ref 0–1)
Eosinophils Absolute: 0.2 10*3/uL (ref 0.0–0.7)
Eosinophils Relative: 4 % (ref 0–5)
HEMATOCRIT: 39.8 % (ref 36.0–46.0)
Hemoglobin: 13.3 g/dL (ref 12.0–15.0)
Lymphocytes Relative: 39 % (ref 12–46)
Lymphs Abs: 1.9 10*3/uL (ref 0.7–4.0)
MCH: 27.4 pg (ref 26.0–34.0)
MCHC: 33.4 g/dL (ref 30.0–36.0)
MCV: 82.1 fL (ref 78.0–100.0)
Monocytes Absolute: 0.4 10*3/uL (ref 0.1–1.0)
Monocytes Relative: 8 % (ref 3–12)
NEUTROS ABS: 2.5 10*3/uL (ref 1.7–7.7)
Neutrophils Relative %: 49 % (ref 43–77)
Platelets: 232 10*3/uL (ref 150–400)
RBC: 4.85 MIL/uL (ref 3.87–5.11)
RDW: 14.5 % (ref 11.5–15.5)
WBC: 5 10*3/uL (ref 4.0–10.5)

## 2013-05-27 LAB — URINALYSIS, ROUTINE W REFLEX MICROSCOPIC
Bilirubin Urine: NEGATIVE
Glucose, UA: NEGATIVE mg/dL
HGB URINE DIPSTICK: NEGATIVE
Ketones, ur: NEGATIVE mg/dL
Leukocytes, UA: NEGATIVE
NITRITE: NEGATIVE
Protein, ur: NEGATIVE mg/dL
SPECIFIC GRAVITY, URINE: 1.018 (ref 1.005–1.030)
UROBILINOGEN UA: 0.2 mg/dL (ref 0.0–1.0)
pH: 6.5 (ref 5.0–8.0)

## 2013-05-27 LAB — COMPREHENSIVE METABOLIC PANEL
ALK PHOS: 82 U/L (ref 39–117)
ALT: 28 U/L (ref 0–35)
AST: 43 U/L — ABNORMAL HIGH (ref 0–37)
Albumin: 3.7 g/dL (ref 3.5–5.2)
BUN: 30 mg/dL — AB (ref 6–23)
CO2: 23 meq/L (ref 19–32)
Calcium: 9.3 mg/dL (ref 8.4–10.5)
Chloride: 97 mEq/L (ref 96–112)
Creatinine, Ser: 1.22 mg/dL — ABNORMAL HIGH (ref 0.50–1.10)
GFR, EST AFRICAN AMERICAN: 55 mL/min — AB (ref >90)
GFR, EST NON AFRICAN AMERICAN: 48 mL/min — AB (ref >90)
GLUCOSE: 113 mg/dL — AB (ref 70–99)
Potassium: 3.9 mEq/L (ref 3.7–5.3)
Sodium: 136 mEq/L — ABNORMAL LOW (ref 137–147)
TOTAL PROTEIN: 7.5 g/dL (ref 6.0–8.3)
Total Bilirubin: 0.7 mg/dL (ref 0.3–1.2)

## 2013-05-27 LAB — LIPASE, BLOOD: LIPASE: 32 U/L (ref 11–59)

## 2013-05-27 MED ORDER — MORPHINE SULFATE 4 MG/ML IJ SOLN
4.0000 mg | Freq: Once | INTRAMUSCULAR | Status: AC
  Administered 2013-05-27: 4 mg via INTRAVENOUS
  Filled 2013-05-27: qty 1

## 2013-05-27 MED ORDER — ONDANSETRON HCL 4 MG/2ML IJ SOLN
4.0000 mg | Freq: Once | INTRAMUSCULAR | Status: AC
  Administered 2013-05-27: 4 mg via INTRAVENOUS
  Filled 2013-05-27: qty 2

## 2013-05-27 MED ORDER — ONDANSETRON HCL 4 MG PO TABS: 4.0000 mg | ORAL_TABLET | Freq: Four times a day (QID) | ORAL | Status: DC

## 2013-05-27 MED ORDER — SODIUM CHLORIDE 0.9 % IV BOLUS (SEPSIS)
1000.0000 mL | Freq: Once | INTRAVENOUS | Status: AC
  Administered 2013-05-27: 1000 mL via INTRAVENOUS

## 2013-05-27 MED ORDER — HYDROCODONE-ACETAMINOPHEN 5-325 MG PO TABS: 1.0000 | ORAL_TABLET | ORAL | Status: DC | PRN

## 2013-05-27 MED ORDER — SODIUM CHLORIDE 0.9 % IV SOLN
INTRAVENOUS | Status: DC
  Administered 2013-05-27: 10:00:00 via INTRAVENOUS

## 2013-05-27 MED ORDER — IOHEXOL 300 MG/ML  SOLN
100.0000 mL | Freq: Once | INTRAMUSCULAR | Status: AC | PRN
  Administered 2013-05-27: 100 mL via INTRAVENOUS

## 2013-05-27 MED ORDER — IOHEXOL 300 MG/ML  SOLN
50.0000 mL | Freq: Once | INTRAMUSCULAR | Status: AC | PRN
  Administered 2013-05-27: 50 mL via ORAL

## 2013-05-27 NOTE — ED Notes (Signed)
Patient transported to X-ray 

## 2013-05-27 NOTE — ED Notes (Addendum)
Pt c/o hypertension x 2 days and lower bilateral abdominal pain and nausea x 1 day.  Pain score 10/10.  [facility] ago for HTN and was directed to follow up w/ PCP.  Hx of small bowel obstruction.  Pt has appointment w/ PCP on Friday.

## 2013-05-27 NOTE — ED Provider Notes (Signed)
TIME SEEN: 8:36 AM  CHIEF COMPLAINT: Abdominal pain, nausea  HPI: Patient is a 58 year old female with a history of CAD, CVA, hypertension, diabetes, prior small bowel obstruction and status post C-sections and hysterectomy who presents to the emergency department with complaints of diffuse abdominal pain, worse in the lower abdomen that started last night as well as nausea. She states this feels like her prior bowel obstructions. Her last normal bowel movement was yesterday morning but she has not had any flatulence since. She has had nausea but no vomiting. No fevers or chills. No dysuria, hematuria, urinary frequency or urgency, vaginal bleeding or discharge. She is also concerned because her blood pressure is elevated. She states she is on 3 blood pressure medications and states she has not missed any doses. Her blood pressure upon my evaluation is 150/91.  ROS: See HPI Constitutional: no fever  Eyes: no drainage  ENT: no runny nose   Cardiovascular:  no chest pain  Resp: no SOB  GI: no vomiting GU: no dysuria Integumentary: no rash  Allergy: no hives  Musculoskeletal: no leg swelling  Neurological: no slurred speech ROS otherwise negative  PAST MEDICAL HISTORY/PAST SURGICAL HISTORY:  Past Medical History  Diagnosis Date  . Diabetes mellitus   . Myocardial infarction 2000  . Chronic pain following surgery or procedure 2008    Abdominal pain  . CAD (coronary artery disease)   . Carotid artery occlusion   . Hypothyroidism   . Anxiety   . Shortness of breath   . Headache(784.0)   . Hypertension     stress test- scheduled for 12/30/2012  . Stroke 11/2012    tingling in L arm , slurred speech- came to ER    MEDICATIONS:  Prior to Admission medications   Medication Sig Start Date End Date Taking? Authorizing Provider  aspirin EC 81 MG tablet Take 81 mg by mouth daily.    Historical Provider, MD  atenolol-chlorthalidone (TENORETIC) 100-25 MG per tablet Take 1 tablet by mouth  daily.    Historical Provider, MD  diclofenac (VOLTAREN) 25 MG EC tablet Take 25 mg by mouth 2 (two) times daily.    Historical Provider, MD  gabapentin (NEURONTIN) 300 MG capsule Take 300 mg by mouth 2 (two) times daily.    Historical Provider, MD  levothyroxine (SYNTHROID, LEVOTHROID) 100 MCG tablet Take 100 mcg by mouth daily before breakfast.    Historical Provider, MD  lisinopril (PRINIVIL) 20 MG tablet Take 1 tablet (20 mg total) by mouth daily. 12/04/12   Kela MillinAdeline C Viyuoh, MD  metFORMIN (GLUCOPHAGE) 500 MG tablet Take 500 mg by mouth 2 (two) times daily with a meal.    Historical Provider, MD  NOVOLIN 70/30 (70-30) 100 UNIT/ML injection Inject 20 Units into the skin 2 (two) times daily. 11/29/12   Historical Provider, MD  rosuvastatin (CRESTOR) 20 MG tablet Take 20 mg by mouth daily with breakfast.     Historical Provider, MD    ALLERGIES:  Allergies  Allergen Reactions  . Strawberry Anaphylaxis  . Cymbalta [Duloxetine Hcl] Other (See Comments)    dizzy  . Lunesta [Eszopiclone]     Bad taste in mouth   . Penicillins Other (See Comments)    unknown  . Tramadol     unknown  . Trazodone And Nefazodone     Shakes     SOCIAL HISTORY:  History  Substance Use Topics  . Smoking status: Former Smoker    Types: Cigarettes    Quit date: 02/05/2001  .  Smokeless tobacco: Never Used  . Alcohol Use: 1.2 oz/week    2 Cans of beer per week     Comment: rarely    FAMILY HISTORY: Family History  Problem Relation Age of Onset  . CAD Brother      X 2  . Diabetes Brother   . Heart disease Brother     before age 59  . Hyperlipidemia Brother   . Hypertension Brother   . Heart attack Brother   . CAD Sister      X 1  . Cancer Sister   . Diabetes Sister   . Heart disease Sister     before age 59  . Hyperlipidemia Sister   . Hypertension Sister   . Hypertension      ALL  . Diabetes Mother   . Heart disease Mother   . Hyperlipidemia Mother   . Hypertension Mother   . Heart  disease Father   . Hyperlipidemia Father   . Hypertension Father   . Hypertension Daughter     EXAM: BP 184/100  Pulse 62  Temp(Src) 97.6 F (36.4 C) (Oral)  Resp 20  SpO2 98% CONSTITUTIONAL: Alert and oriented and responds appropriately to questions. Well-appearing; well-nourished HEAD: Normocephalic EYES: Conjunctivae clear, PERRL ENT: normal nose; no rhinorrhea; moist mucous membranes; pharynx without lesions noted NECK: Supple, no meningismus, no LAD  CARD: RRR; S1 and S2 appreciated; no murmurs, no clicks, no rubs, no gallops RESP: Normal chest excursion without splinting or tachypnea; breath sounds clear and equal bilaterally; no wheezes, no rhonchi, no rales,  ABD/GI: Normal bowel sounds; non-distended; soft, diffusely tender with voluntary guarding in the lower abdomen, no rebound, no peritoneal signs, abdomen is nontympanitic BACK:  The back appears normal and is non-tender to palpation, there is no CVA tenderness EXT: Normal ROM in all joints; non-tender to palpation; no edema; normal capillary refill; no cyanosis    SKIN: Normal color for age and race; warm NEURO: Moves all extremities equally PSYCH: The patient's mood and manner are appropriate. Grooming and personal hygiene are appropriate.  MEDICAL DECISION MAKING: Patient here with diffuse abdominal pain that she states feels similar to her prior small bowel obstructions. She's otherwise hemodynamically stable. We'll obtain abdominal labs, urine and acute abdominal series. We'll give IV fluids, pain and nausea medicine.  ED PROGRESS: Patient's labs are unremarkable. Her urine shows no sign of infection. Acute abdominal series shows no free air, obstruction. Given patient is still significantly tender, will obtain a CT of her abdomen and pelvis.  12:15 PM  Pt's CT shows small to moderate amount of fluid in the cul-de-sac that is likely due to prior small bowel obstructions. However indolent infectious or inflammatory  etiology cannot be excluded but there is no other focal abnormalities. Patient has no fever, no leukocytosis and her labs are otherwise completely unremarkable. Patient's daughter now reports that patient will have crampy abdominal pain after eating fruits and vegetables. She states she has not had a bowel movement in 24 hours. Have discussed with patient that there is no sign of obstruction today but her pain may be due to her adhesions and due to constipation. I have recommended that she start MiraLAX and Colace. Have given strict return precautions. Patient and family verbalize understanding and are comfortable plan. We'll send him with a small amount of Vicodin and Zofran. Have discussed with patient that narcotics may make constipation worse.     Layla MawKristen N Kamuela Magos, DO 05/27/13 1217

## 2013-05-27 NOTE — ED Notes (Signed)
Patient transported to CT 

## 2013-05-27 NOTE — Discharge Instructions (Signed)
Abdominal Pain, Women °Abdominal (stomach, pelvic, or belly) pain can be caused by many things. It is important to tell your doctor: °· The location of the pain. °· Does it come and go or is it present all the time? °· Are there things that start the pain (eating certain foods, exercise)? °· Are there other symptoms associated with the pain (fever, nausea, vomiting, diarrhea)? °All of this is helpful to know when trying to find the cause of the pain. °CAUSES  °· Stomach: virus or bacteria infection, or ulcer. °· Intestine: appendicitis (inflamed appendix), regional ileitis (Crohn's disease), ulcerative colitis (inflamed colon), irritable bowel syndrome, diverticulitis (inflamed diverticulum of the colon), or cancer of the stomach or intestine. °· Gallbladder disease or stones in the gallbladder. °· Kidney disease, kidney stones, or infection. °· Pancreas infection or cancer. °· Fibromyalgia (pain disorder). °· Diseases of the female organs: °· Uterus: fibroid (non-cancerous) tumors or infection. °· Fallopian tubes: infection or tubal pregnancy. °· Ovary: cysts or tumors. °· Pelvic adhesions (scar tissue). °· Endometriosis (uterus lining tissue growing in the pelvis and on the pelvic organs). °· Pelvic congestion syndrome (female organs filling up with blood just before the menstrual period). °· Pain with the menstrual period. °· Pain with ovulation (producing an egg). °· Pain with an IUD (intrauterine device, birth control) in the uterus. °· Cancer of the female organs. °· Functional pain (pain not caused by a disease, may improve without treatment). °· Psychological pain. °· Depression. °DIAGNOSIS  °Your doctor will decide the seriousness of your pain by doing an examination. °· Blood tests. °· X-rays. °· Ultrasound. °· CT scan (computed tomography, special type of X-ray). °· MRI (magnetic resonance imaging). °· Cultures, for infection. °· Barium enema (dye inserted in the large intestine, to better view it with  X-rays). °· Colonoscopy (looking in intestine with a lighted tube). °· Laparoscopy (minor surgery, looking in abdomen with a lighted tube). °· Major abdominal exploratory surgery (looking in abdomen with a large incision). °TREATMENT  °The treatment will depend on the cause of the pain.  °· Many cases can be observed and treated at home. °· Over-the-counter medicines recommended by your caregiver. °· Prescription medicine. °· Antibiotics, for infection. °· Birth control pills, for painful periods or for ovulation pain. °· Hormone treatment, for endometriosis. °· Nerve blocking injections. °· Physical therapy. °· Antidepressants. °· Counseling with a psychologist or psychiatrist. °· Minor or major surgery. °HOME CARE INSTRUCTIONS  °· Do not take laxatives, unless directed by your caregiver. °· Take over-the-counter pain medicine only if ordered by your caregiver. Do not take aspirin because it can cause an upset stomach or bleeding. °· Try a clear liquid diet (broth or water) as ordered by your caregiver. Slowly move to a bland diet, as tolerated, if the pain is related to the stomach or intestine. °· Have a thermometer and take your temperature several times a day, and record it. °· Bed rest and sleep, if it helps the pain. °· Avoid sexual intercourse, if it causes pain. °· Avoid stressful situations. °· Keep your follow-up appointments and tests, as your caregiver orders. °· If the pain does not go away with medicine or surgery, you may try: °· Acupuncture. °· Relaxation exercises (yoga, meditation). °· Group therapy. °· Counseling. °SEEK MEDICAL CARE IF:  °· You notice certain foods cause stomach pain. °· Your home care treatment is not helping your pain. °· You need stronger pain medicine. °· You want your IUD removed. °· You feel faint or   lightheaded.  You develop nausea and vomiting.  You develop a rash.  You are having side effects or an allergy to your medicine. SEEK IMMEDIATE MEDICAL CARE IF:   Your  pain does not go away or gets worse.  You have a fever.  Your pain is felt only in portions of the abdomen. The right side could possibly be appendicitis. The left lower portion of the abdomen could be colitis or diverticulitis.  You are passing blood in your stools (bright red or black tarry stools, with or without vomiting).  You have blood in your urine.  You develop chills, with or without a fever.  You pass out. MAKE SURE YOU:   Understand these instructions.  Will watch your condition.  Will get help right away if you are not doing well or get worse. Document Released: 11/19/2006 Document Revised: 04/16/2011 Document Reviewed: 12/09/2008 [facility] Patient Information 2014 [place], [place].  Constipation, Adult Constipation is when a person has fewer than 3 bowel movements a week; has difficulty having a bowel movement; or has stools that are dry, hard, or larger than normal. As people grow older, constipation is more common. If you try to fix constipation with medicines that make you have a bowel movement (laxatives), the problem may get worse. Long-term laxative use may cause the muscles of the colon to become weak. A low-fiber diet, not taking in enough fluids, and taking certain medicines may make constipation worse. CAUSES   Certain medicines, such as antidepressants, pain medicine, iron supplements, antacids, and water pills.   Certain diseases, such as diabetes, irritable bowel syndrome (IBS), thyroid disease, or depression.   Not drinking enough water.   Not eating enough fiber-rich foods.   Stress or travel.  Lack of physical activity or exercise.  Not going to the restroom when there is the urge to have a bowel movement.  Ignoring the urge to have a bowel movement.  Using laxatives too much. SYMPTOMS   Having fewer than 3 bowel movements a week.   Straining to have a bowel movement.   Having hard, dry, or larger than normal stools.   Feeling  full or bloated.   Pain in the lower abdomen.  Not feeling relief after having a bowel movement. DIAGNOSIS  Your caregiver will take a medical history and perform a physical exam. Further testing may be done for severe constipation. Some tests may include:   A barium enema X-ray to examine your rectum, colon, and sometimes, your small intestine.  A sigmoidoscopy to examine your lower colon.  A colonoscopy to examine your entire colon. TREATMENT  Treatment will depend on the severity of your constipation and what is causing it. Some dietary treatments include drinking more fluids and eating more fiber-rich foods. Lifestyle treatments may include regular exercise. If these diet and lifestyle recommendations do not help, your caregiver may recommend taking over-the-counter laxative medicines to help you have bowel movements. Prescription medicines may be prescribed if over-the-counter medicines do not work.  HOME CARE INSTRUCTIONS   Increase dietary fiber in your diet, such as fruits, vegetables, whole grains, and beans. Limit high-fat and processed sugars in your diet, such as [place] fries, hamburgers, cookies, candies, and soda.   A fiber supplement may be added to your diet if you cannot get enough fiber from foods.   Drink enough fluids to keep your urine clear or pale yellow.   Exercise regularly or as directed by your caregiver.   Go to the restroom when you have  the urge to go. Do not hold it.  Only take medicines as directed by your caregiver. Do not take other medicines for constipation without talking to your caregiver first. SEEK IMMEDIATE MEDICAL CARE IF:   You have bright red blood in your stool.   Your constipation lasts for more than 4 days or gets worse.   You have abdominal or rectal pain.   You have thin, pencil-like stools.  You have unexplained weight loss. MAKE SURE YOU:   Understand these instructions.  Will watch your condition.  Will get help  right away if you are not doing well or get worse. Document Released: 10/21/2003 Document Revised: 04/16/2011 Document Reviewed: 11/03/2012 [facility] Patient Information 2014 [place].  Fiber Content in Foods Drinking plenty of fluids and consuming foods high in fiber can help with constipation. See the list below for the fiber content of some common foods. Starches and Grains / Dietary Fiber (g)  Cheerios, 1 cup / 3 g  Kellogg's Corn Flakes, 1 cup / 0.7 g  Rice Krispies, 1  cup / 0.3 g  Quaker Oat Life Cereal,  cup / 2.1 g  Oatmeal, instant (cooked),  cup / 2 g  Kellogg's Frosted Mini Wheats, 1 cup / 5.1 g  Rice, brown, long-grain (cooked), 1 cup / 3.5 g  Rice, white, long-grain (cooked), 1 cup / 0.6 g  Macaroni, cooked, enriched, 1 cup / 2.5 g Legumes / Dietary Fiber (g)  Beans, baked, canned, plain or vegetarian,  cup / 5.2 g  Beans, kidney, canned,  cup / 6.8 g  Beans, pinto, dried (cooked),  cup / 7.7 g  Beans, pinto, canned,  cup / 5.5 g Breads and Crackers / Dietary Fiber (g)  Graham crackers, plain or honey, 2 squares / 0.7 g  Saltine crackers, 3 squares / 0.3 g  Pretzels, plain, salted, 10 pieces / 1.8 g  Bread, whole-wheat, 1 slice / 1.9 g  Bread, white, 1 slice / 0.7 g  Bread, raisin, 1 slice / 1.2 g  Bagel, plain, 3 oz / 2 g  Tortilla, flour, 1 oz / 0.9 g  Tortilla, corn, 1 small / 1.5 g  Bun, hamburger or hotdog, 1 small / 0.9 g Fruits / Dietary Fiber (g)  Apple, raw with skin, 1 medium / 4.4 g  Applesauce, sweetened,  cup / 1.5 g  Banana,  medium / 1.5 g  Grapes, 10 grapes / 0.4 g  Orange, 1 small / 2.3 g  Raisin, 1.5 oz / 1.6 g  Melon, 1 cup / 1.4 g Vegetables / Dietary Fiber (g)  Green beans, canned,  cup / 1.3 g  Carrots (cooked),  cup / 2.3 g  Broccoli (cooked),  cup / 2.8 g  Peas, frozen (cooked),  cup / 4.4 g  Potatoes, mashed,  cup / 1.6 g  Lettuce, 1 cup / 0.5 g  Corn, canned,  cup / 1.6  g  Tomato,  cup / 1.1 g Document Released: 06/10/2006 Document Revised: 04/16/2011 Document Reviewed: 08/05/2006 [employer] Patient Information 2014 [place].   You may take MiraLAX once a day and Colace 100 mg twice daily for constipation. You may also use over-the-counter fleets enemas and magnesium citrate as needed.

## 2013-07-02 ENCOUNTER — Other Ambulatory Visit: Payer: Self-pay | Admitting: Family Medicine

## 2013-07-02 DIAGNOSIS — R911 Solitary pulmonary nodule: Secondary | ICD-10-CM

## 2013-07-06 ENCOUNTER — Ambulatory Visit
Admission: RE | Admit: 2013-07-06 | Discharge: 2013-07-06 | Disposition: A | Discharge status: Home / Self Care | Payer: Managed Care, Other (non HMO) | Source: Ambulatory Visit | Attending: Family Medicine | Admitting: Family Medicine

## 2013-07-06 DIAGNOSIS — R911 Solitary pulmonary nodule: Secondary | ICD-10-CM

## 2013-07-06 MED ORDER — IOHEXOL 300 MG/ML  SOLN
75.0000 mL | Freq: Once | INTRAMUSCULAR | Status: AC | PRN
  Administered 2013-07-06: 75 mL via INTRAVENOUS

## 2013-08-28 ENCOUNTER — Encounter: Payer: Self-pay | Admitting: Surgery

## 2013-08-31 ENCOUNTER — Encounter: Payer: Self-pay | Admitting: Surgery

## 2013-08-31 ENCOUNTER — Ambulatory Visit (HOSPITAL_COMMUNITY)
Admission: RE | Admit: 2013-08-31 | Discharge: 2013-08-31 | Disposition: A | Discharge status: Home / Self Care | Payer: Managed Care, Other (non HMO) | Source: Ambulatory Visit | Attending: Surgery | Admitting: Surgery

## 2013-08-31 ENCOUNTER — Ambulatory Visit (INDEPENDENT_AMBULATORY_CARE_PROVIDER_SITE_OTHER): Payer: Managed Care, Other (non HMO) | Admitting: Surgery

## 2013-08-31 VITALS — BP 145/78 | HR 64 | Ht 65.0 in | Wt 237.0 lb

## 2013-08-31 DIAGNOSIS — I6529 Occlusion and stenosis of unspecified carotid artery: Secondary | ICD-10-CM

## 2013-08-31 DIAGNOSIS — Z48812 Encounter for surgical aftercare following surgery on the circulatory system: Secondary | ICD-10-CM
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
## 2013-09-01 ENCOUNTER — Other Ambulatory Visit: Payer: Self-pay | Admitting: *Deleted

## 2013-09-01 DIAGNOSIS — I739 Peripheral vascular disease, unspecified: Secondary | ICD-10-CM

## 2013-09-01 MED ORDER — CILOSTAZOL 100 MG PO TABS: 100.0000 mg | ORAL_TABLET | Freq: Two times a day (BID) | ORAL | Status: DC

## 2013-10-14 ENCOUNTER — Other Ambulatory Visit: Payer: Self-pay | Admitting: *Deleted

## 2013-10-14 DIAGNOSIS — Z48812 Encounter for surgical aftercare following surgery on the circulatory system: Secondary | ICD-10-CM

## 2013-10-14 DIAGNOSIS — I6529 Occlusion and stenosis of unspecified carotid artery: Secondary | ICD-10-CM

## 2013-10-30 ENCOUNTER — Encounter: Payer: Self-pay | Admitting: Surgery

## 2013-11-02 ENCOUNTER — Encounter: Payer: Self-pay | Admitting: Surgery

## 2013-11-02 ENCOUNTER — Other Ambulatory Visit: Payer: Self-pay | Admitting: Surgery

## 2013-11-02 ENCOUNTER — Ambulatory Visit (HOSPITAL_COMMUNITY)
Admission: RE | Admit: 2013-11-02 | Discharge: 2013-11-02 | Disposition: A | Discharge status: Home / Self Care | Payer: Managed Care, Other (non HMO) | Source: Ambulatory Visit | Attending: Surgery | Admitting: Surgery

## 2013-11-02 ENCOUNTER — Ambulatory Visit: Payer: Managed Care, Other (non HMO) | Admitting: Surgery

## 2013-11-02 ENCOUNTER — Ambulatory Visit (INDEPENDENT_AMBULATORY_CARE_PROVIDER_SITE_OTHER): Payer: Managed Care, Other (non HMO) | Admitting: Surgery

## 2013-11-02 VITALS — BP 154/89 | HR 64 | Resp 18 | Ht 66.0 in | Wt 233.0 lb

## 2013-11-02 DIAGNOSIS — I6529 Occlusion and stenosis of unspecified carotid artery: Secondary | ICD-10-CM | POA: Insufficient documentation

## 2013-11-02 DIAGNOSIS — Z48812 Encounter for surgical aftercare following surgery on the circulatory system: Secondary | ICD-10-CM | POA: Diagnosis not present

## 2013-11-02 DIAGNOSIS — M79609 Pain in unspecified limb: Secondary | ICD-10-CM | POA: Diagnosis present

## 2013-11-02 DIAGNOSIS — I739 Peripheral vascular disease, unspecified: Secondary | ICD-10-CM | POA: Insufficient documentation

## 2013-11-02 NOTE — Addendum Note (Signed)
Addended by: Sharee Pimple on: 11/02/2013 05:10 PM   Modules accepted: Orders
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
## 2013-11-12 ENCOUNTER — Emergency Department (INDEPENDENT_AMBULATORY_CARE_PROVIDER_SITE_OTHER)
Admission: EM | Admit: 2013-11-12 | Discharge: 2013-11-12 | Disposition: A | Discharge status: Home / Self Care | Payer: Managed Care, Other (non HMO) | Source: Home / Self Care | Attending: Emergency Medicine | Admitting: Emergency Medicine

## 2013-11-12 ENCOUNTER — Emergency Department (INDEPENDENT_AMBULATORY_CARE_PROVIDER_SITE_OTHER): Payer: Managed Care, Other (non HMO)

## 2013-11-12 ENCOUNTER — Encounter (HOSPITAL_COMMUNITY): Payer: Self-pay | Admitting: Emergency Medicine

## 2013-11-12 DIAGNOSIS — S40022A Contusion of left upper arm, initial encounter: Secondary | ICD-10-CM

## 2013-11-12 DIAGNOSIS — S46012A Strain of muscle(s) and tendon(s) of the rotator cuff of left shoulder, initial encounter: Secondary | ICD-10-CM

## 2013-11-12 DIAGNOSIS — Y92410 Unspecified street and highway as the place of occurrence of the external cause: Secondary | ICD-10-CM

## 2013-11-12 DIAGNOSIS — S39012A Strain of muscle, fascia and tendon of lower back, initial encounter: Secondary | ICD-10-CM

## 2013-11-12 MED ORDER — DICLOFENAC SODIUM 75 MG PO TBEC: 75.0000 mg | DELAYED_RELEASE_TABLET | Freq: Two times a day (BID) | ORAL | Status: DC

## 2013-11-12 MED ORDER — HYDROCODONE-ACETAMINOPHEN 5-325 MG PO TABS: ORAL_TABLET | ORAL | Status: DC

## 2013-11-12 MED ORDER — CYCLOBENZAPRINE HCL 5 MG PO TABS: 5.0000 mg | ORAL_TABLET | Freq: Three times a day (TID) | ORAL | Status: DC | PRN

## 2013-11-12 MED ORDER — HYDROCODONE-ACETAMINOPHEN 5-325 MG PO TABS
2.0000 | ORAL_TABLET | Freq: Once | ORAL | Status: AC
  Administered 2013-11-12: 2 via ORAL

## 2013-11-12 MED ORDER — HYDROCODONE-ACETAMINOPHEN 5-325 MG PO TABS
ORAL_TABLET | ORAL | Status: AC
Start: 2013-11-12 — End: 2013-11-12
  Filled 2013-11-12: qty 2
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
## 2013-11-12 NOTE — Discharge Instructions (Signed)
Do exercises twice daily followed by moist heat for 15 minutes. ° ° ° ° ° °Try to be as active as possible. ° °If no better in 2 weeks, follow up with orthopedist. ° °Most shoulder pain is caused by soft tissue problems rather than arthritis.  Rotator cuff tendonitis or tendonosis, rotator cuff tears, impingement syndrome and cartilege (labrum tears) are a few of the common causes of shoulder pain.  Fortunately, most of these can be treated with conservative measures as outlined below. ° °Do not do the following: °· Doing any work with the arms above shoulder level (especially lifting) until the pain has subsided. °· Sleeping on the affected side.  Especially avoid sleeping with your arm under your head or your pillow.  This is a habit that is hard to break.  Some people have to pin the arm of their pajamas to the chest area to prevent this. ° °Do the following: °· Do the shoulder exercises below twice daily followed by ice for 10 minutes. °· If no better in 1 month, follow up here, with your primary care doctor, or with an orthopedist. °· Use of over the counter pain meds can be of help.  Tylenol (or acetaminophen) is the safest to use.  It often helps to take this regularly.  You can take up to 2 325 mg tablets 5 times daily, but it best to start out much lower that that, perhaps 2 325 mg tablets twice daily, then increase from there. People who are on the blood thinner warfarin have to be careful about taking high doses of Tylenol.  For people who are able to tolerate them, ibuprofen and Aleve can also help with the pain.  You should discuss these agents with your physician before taking them.  People with chronic kidney disease, hypertension, peptic ulcer disease, and reflux can suffer adverse side effects. They should not be taken with warfarin. The maximum dosage of ibuprofen is 800 mg 3 times daily with meals.  The maximum dosage of Aleve is 2 and 1/2 tablets twice daily with food, but again, start out low  and gradually increase the dose until adequate pain relief is achieved. Ibuprofen and Aleve should always be taken with food. ° ° ° ° ° ° °

## 2013-11-12 NOTE — ED Notes (Signed)
Patient transported to X-ray 

## 2013-12-11 ENCOUNTER — Ambulatory Visit (HOSPITAL_COMMUNITY): Payer: Managed Care, Other (non HMO) | Attending: Emergency Medicine

## 2013-12-11 ENCOUNTER — Emergency Department (INDEPENDENT_AMBULATORY_CARE_PROVIDER_SITE_OTHER)
Admission: EM | Admit: 2013-12-11 | Discharge: 2013-12-11 | Disposition: A | Discharge status: Home / Self Care | Payer: Managed Care, Other (non HMO) | Source: Home / Self Care | Attending: Family Medicine | Admitting: Family Medicine

## 2013-12-11 ENCOUNTER — Encounter (HOSPITAL_COMMUNITY): Payer: Self-pay | Admitting: Emergency Medicine

## 2013-12-11 DIAGNOSIS — R0989 Other specified symptoms and signs involving the circulatory and respiratory systems: Secondary | ICD-10-CM | POA: Insufficient documentation

## 2013-12-11 DIAGNOSIS — R079 Chest pain, unspecified: Secondary | ICD-10-CM | POA: Insufficient documentation

## 2013-12-11 DIAGNOSIS — R05 Cough: Secondary | ICD-10-CM | POA: Diagnosis present

## 2013-12-11 DIAGNOSIS — R0600 Dyspnea, unspecified: Secondary | ICD-10-CM

## 2013-12-11 DIAGNOSIS — J4520 Mild intermittent asthma, uncomplicated: Secondary | ICD-10-CM

## 2013-12-11 DIAGNOSIS — R059 Cough, unspecified: Secondary | ICD-10-CM

## 2013-12-11 MED ORDER — ALBUTEROL SULFATE (2.5 MG/3ML) 0.083% IN NEBU
INHALATION_SOLUTION | RESPIRATORY_TRACT | Status: AC
  Filled 2013-12-11: qty 3

## 2013-12-11 MED ORDER — IPRATROPIUM-ALBUTEROL 0.5-2.5 (3) MG/3ML IN SOLN
3.0000 mL | Freq: Once | RESPIRATORY_TRACT | Status: AC
  Administered 2013-12-11: 3 mL via RESPIRATORY_TRACT

## 2013-12-11 MED ORDER — PREDNISONE 20 MG PO TABS: ORAL_TABLET | ORAL | Status: DC

## 2013-12-11 MED ORDER — ALBUTEROL SULFATE HFA 108 (90 BASE) MCG/ACT IN AERS: 2.0000 | INHALATION_SPRAY | RESPIRATORY_TRACT | Status: DC | PRN

## 2013-12-11 MED ORDER — IPRATROPIUM-ALBUTEROL 0.5-2.5 (3) MG/3ML IN SOLN
RESPIRATORY_TRACT | Status: AC
  Filled 2013-12-11: qty 3

## 2013-12-11 MED ORDER — GUAIFENESIN-CODEINE 100-10 MG/5ML PO SYRP: ORAL_SOLUTION | ORAL | Status: DC

## 2013-12-11 MED ORDER — ALBUTEROL SULFATE (2.5 MG/3ML) 0.083% IN NEBU
2.5000 mg | INHALATION_SOLUTION | Freq: Once | RESPIRATORY_TRACT | Status: AC
  Administered 2013-12-11: 2.5 mg via RESPIRATORY_TRACT

## 2013-12-11 NOTE — Discharge Instructions (Signed)
Bronchospasm °A bronchospasm is a spasm or tightening of the airways going into the lungs. During a bronchospasm breathing becomes more difficult because the airways get smaller. When this happens there can be coughing, a whistling sound when breathing (wheezing), and difficulty breathing. Bronchospasm is often associated with asthma, but not all patients who experience a bronchospasm have asthma. °CAUSES  °A bronchospasm is caused by inflammation or irritation of the airways. The inflammation or irritation may be triggered by:  °1. Allergies (such as to animals, pollen, food, or mold). Allergens that cause bronchospasm may cause wheezing immediately after exposure or many hours later.   °2. Infection. Viral infections are believed to be the most common cause of bronchospasm.   °3. Exercise.   °4. Irritants (such as pollution, cigarette smoke, strong odors, aerosol sprays, and paint fumes).   °5. Weather changes. Winds increase molds and pollens in the air. Rain refreshes the air by washing irritants out. Cold air may cause inflammation.   °6. Stress and emotional upset.   °SIGNS AND SYMPTOMS  °· Wheezing.   °· Excessive nighttime coughing.   °· Frequent or severe coughing with a simple cold.   °· Chest tightness.   °· Shortness of breath.   °DIAGNOSIS  °Bronchospasm is usually diagnosed through a history and physical exam. Tests, such as chest X-rays, are sometimes done to look for other conditions. °TREATMENT  °· Inhaled medicines can be given to open up your airways and help you breathe. The medicines can be given using either an inhaler or a nebulizer machine. °· Corticosteroid medicines may be given for severe bronchospasm, usually when it is associated with asthma. °HOME CARE INSTRUCTIONS  °· Always have a plan prepared for seeking medical care. Know when to call your health care provider and local emergency services (911 in the [place] Know where you can access local emergency care. °· Only take medicines as  directed by your health care provider. °· If you were prescribed an inhaler or nebulizer machine, ask your health care provider to explain how to use it correctly. Always use a spacer with your inhaler if you were given one. °· It is necessary to remain calm during an attack. Try to relax and breathe more slowly.  °· Control your home environment in the following ways:   °· Change your heating and air conditioning filter at least once a month.   °· Limit your use of fireplaces and wood stoves. °· Do not smoke and do not allow smoking in your home.   °· Avoid exposure to perfumes and fragrances.   °· Get rid of pests (such as roaches and mice) and their droppings.   °· Throw away plants if you see mold on them.   °· Keep your house clean and dust free.   °· Replace carpet with wood, tile, or vinyl flooring. Carpet can trap dander and dust.   °· Use allergy-proof pillows, mattress covers, and box spring covers.   °· Wash bed sheets and blankets every week in hot water and dry them in a dryer.   °· Use blankets that are made of polyester or cotton.   °· Wash hands frequently. °SEEK MEDICAL CARE IF:  °· You have muscle aches.   °· You have chest pain.   °· The sputum changes from clear or white to yellow, green, gray, or bloody.   °· The sputum you cough up gets thicker.   °· There are problems that may be related to the medicine you are given, such as a rash, itching, swelling, or trouble breathing.   °SEEK IMMEDIATE MEDICAL CARE IF:  °· You have worsening wheezing and coughing even   after taking your prescribed medicines.   You have increased difficulty breathing.   You develop severe chest pain. MAKE SURE YOU:   Understand these instructions.  Will watch your condition.  Will get help right away if you are not doing well or get worse. Document Released: 01/25/2003 Document Revised: 01/27/2013 Document Reviewed: 07/14/2012 [facility] Patient Information 2015 [place], [place]. This information is not  intended to replace advice given to you by your health care provider. Make sure you discuss any questions you have with your health care provider.  Chest Wall Pain Chest wall pain is pain felt in or around the chest bones and muscles. It may take up to 6 weeks to get better. It may take longer if you are active. Chest wall pain can happen on its own. Other times, things like germs, injury, coughing, or exercise can cause the pain. HOME CARE  7. Avoid activities that make you tired or cause pain. Try not to use your chest, belly (abdominal), or side muscles. Do not use heavy weights. 8. Put ice on the sore area. 1. Put ice in a plastic bag. 2. Place a towel between your skin and the bag. 3. Leave the ice on for 15-20 minutes for the first 2 days. 9. Only take medicine as told by your doctor. GET HELP RIGHT AWAY IF:   You have more pain or are very uncomfortable.  You have a fever.  Your chest pain gets worse.  You have new problems.  You feel sick to your stomach (nauseous) or throw up (vomit).  You start to sweat or feel lightheaded.  You have a cough with mucus (phlegm).  You cough up blood. MAKE SURE YOU:   Understand these instructions.  Will watch your condition.  Will get help right away if you are not doing well or get worse. Document Released: 07/11/2007 Document Revised: 04/16/2011 Document Reviewed: 09/18/2010 [facility] Patient Information 2015 [place], [place]. This information is not intended to replace advice given to you by your health care provider. Make sure you discuss any questions you have with your health care provider.  Cough, Adult The primary reason for your cough is bronchospasm. Rely more on the inhaler and not as much on the cough medicine  A cough is a reflex. It helps you clear your throat and airways. A cough can help heal your body. A cough can last 2 or 3 weeks (acute) or may last more than 8 weeks (chronic). Some common causes of a cough can  include an infection, allergy, or a cold. HOME CARE 10. Only take medicine as told by your doctor. 11. If given, take your medicines (antibiotics) as told. Finish them even if you start to feel better. 12. Use a cold steam vaporizer or humidifier in your home. This can help loosen thick spit (secretions). 13. Sleep so you are almost sitting up (semi-upright). Use pillows to do this. This helps reduce coughing. 14. Rest as needed. 15. Stop smoking if you smoke. GET HELP RIGHT AWAY IF:  You have yellowish-white fluid (pus) in your thick spit.  Your cough gets worse.  Your medicine does not reduce coughing, and you are losing sleep.  You cough up blood.  You have trouble breathing.  Your pain gets worse and medicine does not help.  You have a fever. MAKE SURE YOU:   Understand these instructions.  Will watch your condition.  Will get help right away if you are not doing well or get worse. Document Released: 10/05/2010  Document Revised: 06/08/2013 Document Reviewed: 10/05/2010 [facility] Patient Information 2015 [place]Exit[place], [place]. This information is not intended to replace advice given to you by your health care provider. Make sure you discuss any questions you have with your health care provider.  How to Use an Inhaler Using your inhaler correctly is very important. Good technique will make sure that the medicine reaches your lungs.  HOW TO USE AN INHALER: 16. Take the cap off the inhaler. 17. If this is the first time using your inhaler, you need to prime it. Shake the inhaler for 5 seconds. Release four puffs into the air, away from your face. Ask your doctor for help if you have questions. 18. Shake the inhaler for 5 seconds. 19. Turn the inhaler so the bottle is above the mouthpiece. 20. Put your pointer finger on top of the bottle. Your thumb holds the bottom of the inhaler. 21. Open your mouth. 22. Either hold the inhaler away from your mouth (the width of 2 fingers) or  place your lips tightly around the mouthpiece. Ask your doctor which way to use your inhaler. 23. Breathe out as much air as possible. 24. Breathe in and push down on the bottle 1 time to release the medicine. You will feel the medicine go in your mouth and throat. 25. Continue to take a deep breath in very slowly. Try to fill your lungs. 26. After you have breathed in completely, hold your breath for 10 seconds. This will help the medicine to settle in your lungs. If you cannot hold your breath for 10 seconds, hold it for as long as you can before you breathe out. 27. Breathe out slowly, through pursed lips. Whistling is an example of pursed lips. 28. If your doctor has told you to take more than 1 puff, wait at least 15-30 seconds between puffs. This will help you get the best results from your medicine. Do not use the inhaler more than your doctor tells you to. 29. Put the cap back on the inhaler. 30. Follow the directions from your doctor or from the inhaler package about cleaning the inhaler. If you use more than one inhaler, ask your doctor which inhalers to use and what order to use them in. Ask your doctor to help you figure out when you will need to refill your inhaler.  If you use a steroid inhaler, always rinse your mouth with water after your last puff, gargle and spit out the water. Do not swallow the water. GET HELP IF:  The inhaler medicine only partially helps to stop wheezing or shortness of breath.  You are having trouble using your inhaler.  You have some increase in thick spit (phlegm). GET HELP RIGHT AWAY IF:  The inhaler medicine does not help your wheezing or shortness of breath or you have tightness in your chest.  You have dizziness, headaches, or fast heart rate.  You have chills, fever, or night sweats.  You have a large increase of thick spit, or your thick spit is bloody. MAKE SURE YOU:   Understand these instructions.  Will watch your condition.  Will  get help right away if you are not doing well or get worse. Document Released: 11/01/2007 Document Revised: 11/12/2012 Document Reviewed: 08/21/2012 [facility] Patient Information 2015 [place], [place]LL[place]. This information is not intended to replace advice given to you by your health care provider. Make sure you discuss any questions you have with your health care provider.
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
## 2013-12-11 NOTE — ED Notes (Signed)
Pt states that she has been coughing since last night with difficulty breathing. Pt is in no acute distress at this time.

## 2013-12-21 ENCOUNTER — Ambulatory Visit
Admission: RE | Admit: 2013-12-21 | Discharge: 2013-12-21 | Disposition: A | Discharge status: Home / Self Care | Payer: Managed Care, Other (non HMO) | Source: Ambulatory Visit | Attending: Family Medicine | Admitting: Family Medicine

## 2013-12-21 ENCOUNTER — Other Ambulatory Visit: Payer: Self-pay | Admitting: Family Medicine

## 2013-12-21 DIAGNOSIS — J189 Pneumonia, unspecified organism: Secondary | ICD-10-CM

## 2014-01-13 ENCOUNTER — Other Ambulatory Visit (HOSPITAL_COMMUNITY): Payer: Self-pay | Admitting: Family Medicine

## 2014-01-13 DIAGNOSIS — R059 Cough, unspecified: Secondary | ICD-10-CM

## 2014-01-13 DIAGNOSIS — R0602 Shortness of breath: Secondary | ICD-10-CM

## 2014-01-13 DIAGNOSIS — R05 Cough: Secondary | ICD-10-CM

## 2014-01-14 ENCOUNTER — Ambulatory Visit (HOSPITAL_COMMUNITY)
Admission: RE | Admit: 2014-01-14 | Discharge: 2014-01-14 | Disposition: A | Discharge status: Home / Self Care | Payer: Managed Care, Other (non HMO) | Source: Ambulatory Visit | Attending: Family Medicine | Admitting: Family Medicine

## 2014-01-14 DIAGNOSIS — R05 Cough: Secondary | ICD-10-CM | POA: Insufficient documentation

## 2014-01-14 DIAGNOSIS — R0602 Shortness of breath: Secondary | ICD-10-CM | POA: Diagnosis present

## 2014-01-14 DIAGNOSIS — R059 Cough, unspecified: Secondary | ICD-10-CM

## 2014-01-14 MED ORDER — LIDOCAINE HCL (PF) 1 % IJ SOLN
INTRAMUSCULAR | Status: AC
  Filled 2014-01-14: qty 30

## 2014-01-14 MED ORDER — IOHEXOL 350 MG/ML SOLN
80.0000 mL | Freq: Once | INTRAVENOUS | Status: AC | PRN
  Administered 2014-01-14: 60 mL via INTRAVENOUS
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
## 2014-01-19 ENCOUNTER — Ambulatory Visit (INDEPENDENT_AMBULATORY_CARE_PROVIDER_SITE_OTHER): Payer: Managed Care, Other (non HMO) | Admitting: Cardiovascular Disease

## 2014-01-19 ENCOUNTER — Encounter: Payer: Self-pay | Admitting: *Deleted

## 2014-01-19 ENCOUNTER — Encounter: Payer: Self-pay | Admitting: Cardiovascular Disease

## 2014-01-19 VITALS — BP 116/84 | HR 60 | Ht 65.5 in | Wt 243.1 lb

## 2014-01-19 DIAGNOSIS — I2510 Atherosclerotic heart disease of native coronary artery without angina pectoris: Secondary | ICD-10-CM

## 2014-01-19 DIAGNOSIS — E108 Type 1 diabetes mellitus with unspecified complications: Secondary | ICD-10-CM

## 2014-01-19 DIAGNOSIS — R0989 Other specified symptoms and signs involving the circulatory and respiratory systems: Secondary | ICD-10-CM

## 2014-01-19 DIAGNOSIS — R079 Chest pain, unspecified: Secondary | ICD-10-CM

## 2014-01-19 DIAGNOSIS — I2583 Coronary atherosclerosis due to lipid rich plaque: Secondary | ICD-10-CM

## 2014-01-19 DIAGNOSIS — E785 Hyperlipidemia, unspecified: Secondary | ICD-10-CM

## 2014-01-19 DIAGNOSIS — R0602 Shortness of breath: Secondary | ICD-10-CM

## 2014-01-19 LAB — CBC WITH DIFFERENTIAL/PLATELET
BASOS PCT: 0.5 % (ref 0.0–3.0)
Basophils Absolute: 0 10*3/uL (ref 0.0–0.1)
Eosinophils Absolute: 0.3 10*3/uL (ref 0.0–0.7)
Eosinophils Relative: 5.2 % — ABNORMAL HIGH (ref 0.0–5.0)
HCT: 43 % (ref 36.0–46.0)
Hemoglobin: 14.2 g/dL (ref 12.0–15.0)
LYMPHS PCT: 41.9 % (ref 12.0–46.0)
Lymphs Abs: 2.3 10*3/uL (ref 0.7–4.0)
MCHC: 33.1 g/dL (ref 30.0–36.0)
MCV: 82.8 fl (ref 78.0–100.0)
MONO ABS: 0.4 10*3/uL (ref 0.1–1.0)
Monocytes Relative: 6.8 % (ref 3.0–12.0)
NEUTROS PCT: 45.6 % (ref 43.0–77.0)
Neutro Abs: 2.5 10*3/uL (ref 1.4–7.7)
Platelets: 237 10*3/uL (ref 150.0–400.0)
RBC: 5.19 Mil/uL — AB (ref 3.87–5.11)
RDW: 14.6 % (ref 11.5–15.5)
WBC: 5.5 10*3/uL (ref 4.0–10.5)

## 2014-01-19 LAB — BASIC METABOLIC PANEL
BUN: 33 mg/dL — ABNORMAL HIGH (ref 6–23)
CO2: 22 mEq/L (ref 19–32)
Calcium: 9.7 mg/dL (ref 8.4–10.5)
Chloride: 98 mEq/L (ref 96–112)
Creatinine, Ser: 1.3 mg/dL — ABNORMAL HIGH (ref 0.4–1.2)
GFR: 52.42 mL/min — AB (ref >60.00)
GLUCOSE: 196 mg/dL — AB (ref 70–99)
Potassium: 3.9 mEq/L (ref 3.5–5.1)
SODIUM: 131 meq/L — AB (ref 135–145)

## 2014-01-19 LAB — PROTIME-INR
INR: 1 ratio (ref 0.8–1.0)
Prothrombin Time: 11.3 s (ref 9.6–13.1)

## 2014-01-19 NOTE — Addendum Note (Signed)
Addended by: Scherrie BatemanYORK, CHRISTINE E on: 01/19/2014 02:51 PM   Modules accepted: Orders, Medications

## 2014-01-19 NOTE — Assessment & Plan Note (Signed)
Previous CEA f/u Brabham for surveilence US  ASA

## 2014-01-19 NOTE — Patient Instructions (Signed)
Your physician has requested that you have a cardiac catheterization. Cardiac catheterization is used to diagnose and/or treat various heart conditions. Doctors may recommend this procedure for a number of different reasons. The most common reason is to evaluate chest pain. Chest pain can be a symptom of coronary artery disease (CAD), and cardiac catheterization can show whether plaque is narrowing or blocking your heart's arteries. This procedure is also used to evaluate the valves, as well as measure the blood flow and oxygen levels in different parts of your heart. For further information please visit [email or web address]. Please follow instruction sheet, as given. Your physician recommends that you return for lab work in:  Today    BMET   CBC  AND INR

## 2014-01-19 NOTE — Assessment & Plan Note (Signed)
Cholesterol is at goal.  Continue current dose of statin and diet Rx.  No myalgias or side effects.  F/U  LFT's in 6 months. Lab Results  Component Value Date   LDLCALC 143* 12/03/2012

## 2014-01-19 NOTE — Assessment & Plan Note (Signed)
Seems to have prolonged URI  CT no PE  Doubt CHF  Echo to further assess EF normal 2014

## 2014-01-19 NOTE — Assessment & Plan Note (Signed)
Distant history of ? [place] 2009 chest pain  Heart cath but no PCI/Stent  Increasing exertional dyspnea.  ? Anginal equivalent  Known vascular disease and poorly controlled DM with myovue 2014 apical defect  Discussed options with patient and daughter  Legrand RamsFavor diagnostic cath for definitive diagnosis in patient at high risk for CAD and coronary calcium on CT.  Hopefully can do from right radial as increased bleeding risk from groin with obesity Hospital called scheduled 12/22  Hold insulin and diuretic morning of procedure.  Labs today Orders written

## 2014-01-19 NOTE — Assessment & Plan Note (Signed)
Consider referral to endocrine for Rx options as I think her weight gain is related to poor control Sh

## 2014-01-20 ENCOUNTER — Ambulatory Visit (HOSPITAL_COMMUNITY): Payer: Managed Care, Other (non HMO) | Attending: Cardiology | Admitting: Cardiology

## 2014-01-20 DIAGNOSIS — E669 Obesity, unspecified: Secondary | ICD-10-CM | POA: Diagnosis not present

## 2014-01-20 DIAGNOSIS — R0600 Dyspnea, unspecified: Secondary | ICD-10-CM | POA: Diagnosis not present

## 2014-01-20 DIAGNOSIS — E785 Hyperlipidemia, unspecified: Secondary | ICD-10-CM | POA: Diagnosis not present

## 2014-01-20 DIAGNOSIS — Z87891 Personal history of nicotine dependence: Secondary | ICD-10-CM | POA: Insufficient documentation

## 2014-01-20 DIAGNOSIS — I10 Essential (primary) hypertension: Secondary | ICD-10-CM | POA: Diagnosis not present

## 2014-01-20 DIAGNOSIS — I252 Old myocardial infarction: Secondary | ICD-10-CM | POA: Insufficient documentation

## 2014-01-20 DIAGNOSIS — E119 Type 2 diabetes mellitus without complications: Secondary | ICD-10-CM | POA: Insufficient documentation

## 2014-01-20 DIAGNOSIS — R079 Chest pain, unspecified: Secondary | ICD-10-CM | POA: Insufficient documentation

## 2014-01-20 NOTE — Progress Notes (Signed)
Echo performed. 

## 2014-01-25 ENCOUNTER — Other Ambulatory Visit: Payer: Self-pay | Admitting: Cardiovascular Disease

## 2014-01-26 ENCOUNTER — Encounter (HOSPITAL_COMMUNITY): Payer: Self-pay | Admitting: Cardiovascular Disease

## 2014-01-26 ENCOUNTER — Ambulatory Visit (HOSPITAL_COMMUNITY)
Admission: RE | Admit: 2014-01-26 | Discharge: 2014-01-26 | Disposition: A | Discharge status: Home / Self Care | Payer: Managed Care, Other (non HMO) | Source: Ambulatory Visit | Attending: Cardiovascular Disease | Admitting: Cardiovascular Disease

## 2014-01-26 ENCOUNTER — Encounter (HOSPITAL_COMMUNITY)
Admission: RE | Disposition: A | Discharge status: Home / Self Care | Payer: Self-pay | Source: Ambulatory Visit | Attending: Cardiovascular Disease

## 2014-01-26 DIAGNOSIS — Z7982 Long term (current) use of aspirin: Secondary | ICD-10-CM | POA: Insufficient documentation

## 2014-01-26 DIAGNOSIS — R079 Chest pain, unspecified: Secondary | ICD-10-CM | POA: Diagnosis present

## 2014-01-26 DIAGNOSIS — R109 Unspecified abdominal pain: Secondary | ICD-10-CM | POA: Insufficient documentation

## 2014-01-26 DIAGNOSIS — E039 Hypothyroidism, unspecified: Secondary | ICD-10-CM | POA: Insufficient documentation

## 2014-01-26 DIAGNOSIS — I10 Essential (primary) hypertension: Secondary | ICD-10-CM | POA: Insufficient documentation

## 2014-01-26 DIAGNOSIS — G8929 Other chronic pain: Secondary | ICD-10-CM | POA: Diagnosis not present

## 2014-01-26 DIAGNOSIS — Z87891 Personal history of nicotine dependence: Secondary | ICD-10-CM | POA: Diagnosis not present

## 2014-01-26 DIAGNOSIS — Z833 Family history of diabetes mellitus: Secondary | ICD-10-CM | POA: Diagnosis not present

## 2014-01-26 DIAGNOSIS — Z8249 Family history of ischemic heart disease and other diseases of the circulatory system: Secondary | ICD-10-CM | POA: Diagnosis not present

## 2014-01-26 DIAGNOSIS — E119 Type 2 diabetes mellitus without complications: Secondary | ICD-10-CM | POA: Insufficient documentation

## 2014-01-26 DIAGNOSIS — I2510 Atherosclerotic heart disease of native coronary artery without angina pectoris: Secondary | ICD-10-CM | POA: Insufficient documentation

## 2014-01-26 DIAGNOSIS — I252 Old myocardial infarction: Secondary | ICD-10-CM | POA: Insufficient documentation

## 2014-01-26 DIAGNOSIS — I517 Cardiomegaly: Secondary | ICD-10-CM | POA: Insufficient documentation

## 2014-01-26 DIAGNOSIS — Z79899 Other long term (current) drug therapy: Secondary | ICD-10-CM | POA: Diagnosis not present

## 2014-01-26 HISTORY — PX: LEFT HEART CATHETERIZATION WITH CORONARY ANGIOGRAM: SHX5451

## 2014-01-26 LAB — GLUCOSE, CAPILLARY
GLUCOSE-CAPILLARY: 165 mg/dL — AB (ref 70–99)
Glucose-Capillary: 218 mg/dL — ABNORMAL HIGH (ref 70–99)
Glucose-Capillary: 159 mg/dL — ABNORMAL HIGH (ref 70–99)

## 2014-01-26 LAB — CK TOTAL AND CKMB (NOT AT ARMC)
CK, MB: 1.2 ng/mL (ref 0.3–4.0)
RELATIVE INDEX: 0.8 (ref 0.0–2.5)
Total CK: 147 U/L (ref 7–177)

## 2014-01-26 SURGERY — LEFT HEART CATHETERIZATION WITH CORONARY ANGIOGRAM
Anesthesia: LOCAL

## 2014-01-26 MED ORDER — SODIUM CHLORIDE 0.9 % IJ SOLN: 3.0000 mL | INTRAMUSCULAR | Status: DC | PRN

## 2014-01-26 MED ORDER — LIDOCAINE HCL (PF) 1 % IJ SOLN
INTRAMUSCULAR | Status: AC
  Filled 2014-01-26: qty 30

## 2014-01-26 MED ORDER — HEPARIN (PORCINE) IN NACL 2-0.9 UNIT/ML-% IJ SOLN
INTRAMUSCULAR | Status: AC
  Filled 2014-01-26: qty 1500

## 2014-01-26 MED ORDER — ASPIRIN 81 MG PO CHEW
81.0000 mg | CHEWABLE_TABLET | ORAL | Status: AC
  Administered 2014-01-26: 81 mg via ORAL

## 2014-01-26 MED ORDER — SODIUM CHLORIDE 0.9 % IV SOLN: INTRAVENOUS | Status: AC

## 2014-01-26 MED ORDER — OXYCODONE-ACETAMINOPHEN 5-325 MG PO TABS
ORAL_TABLET | ORAL | Status: AC
  Filled 2014-01-26: qty 1

## 2014-01-26 MED ORDER — HEPARIN SODIUM (PORCINE) 1000 UNIT/ML IJ SOLN
INTRAMUSCULAR | Status: AC
  Filled 2014-01-26: qty 1

## 2014-01-26 MED ORDER — MIDAZOLAM HCL 2 MG/2ML IJ SOLN
INTRAMUSCULAR | Status: AC
Start: 2014-01-26 — End: 2014-01-26
  Filled 2014-01-26: qty 2

## 2014-01-26 MED ORDER — SODIUM CHLORIDE 0.9 % IV SOLN: 250.0000 mL | INTRAVENOUS | Status: DC | PRN

## 2014-01-26 MED ORDER — DIAZEPAM 2 MG PO TABS: 2.0000 mg | ORAL_TABLET | ORAL | Status: DC | PRN

## 2014-01-26 MED ORDER — SODIUM CHLORIDE 0.9 % IV SOLN
INTRAVENOUS | Status: DC
  Administered 2014-01-26: 1000 mL via INTRAVENOUS

## 2014-01-26 MED ORDER — MIDAZOLAM HCL 2 MG/2ML IJ SOLN
INTRAMUSCULAR | Status: AC
  Filled 2014-01-26: qty 2

## 2014-01-26 MED ORDER — VERAPAMIL HCL 2.5 MG/ML IV SOLN
INTRAVENOUS | Status: AC
  Filled 2014-01-26: qty 2

## 2014-01-26 MED ORDER — SODIUM CHLORIDE 0.9 % IJ SOLN: 3.0000 mL | Freq: Two times a day (BID) | INTRAMUSCULAR | Status: DC

## 2014-01-26 MED ORDER — NITROGLYCERIN 1 MG/10 ML FOR IR/CATH LAB
INTRA_ARTERIAL | Status: AC
  Filled 2014-01-26: qty 10

## 2014-01-26 MED ORDER — OXYCODONE-ACETAMINOPHEN 5-325 MG PO TABS
1.0000 | ORAL_TABLET | Freq: Once | ORAL | Status: AC
  Administered 2014-01-26: 1 via ORAL

## 2014-01-26 MED ORDER — ASPIRIN 81 MG PO CHEW
CHEWABLE_TABLET | ORAL | Status: AC
  Filled 2014-01-26: qty 1

## 2014-01-26 NOTE — Interval H&P Note (Signed)
History and Physical Interval Note:  01/26/2014 8:40 AM  [name]  has presented today for surgery, with the diagnosis of cp  The various methods of treatment have been discussed with the patient and family. After consideration of risks, benefits and other options for treatment, the patient has consented to  Procedure(s): LEFT HEART CATHETERIZATION WITH CORONARY ANGIOGRAM (N/A) as a surgical intervention .  The patient's history has been reviewed, patient examined, no change in status, stable for surgery.  I have reviewed the patient's chart and labs.  Questions were answered to the patient's satisfaction.     Charlton HawsPeter Noha Karasik

## 2014-01-26 NOTE — Discharge Instructions (Signed)
Angiogram, Care After Refer to this sheet in the next few weeks. These instructions provide you with information on caring for yourself after your procedure. Your health care provider may also give you more specific instructions. Your treatment has been planned according to current medical practices, but problems sometimes occur. Call your health care provider if you have any problems or questions after your procedure.  WHAT TO EXPECT AFTER THE PROCEDURE After your procedure, it is typical to have the following sensations:  Minor discomfort or tenderness and a small bump at the catheter insertion site. The bump should usually decrease in size and tenderness within 1 to 2 weeks.  Any bruising will usually fade within 2 to 4 weeks. HOME CARE INSTRUCTIONS   You may need to keep taking blood thinners if they were prescribed for you. Take medicines only as directed by your health care provider.  Do not apply powder or lotion to the site.  Do not take baths, swim, or use a hot tub until your health care provider approves.  You may shower 24 hours after the procedure. Remove the bandage (dressing) and gently wash the site with plain soap and water. Gently pat the site dry.  Inspect the site at least twice daily.  Limit your activity for the first 48 hours. Do not bend, squat, or lift anything over 5 lbs for 5 days.  Plan to have someone take you home after the procedure. Follow instructions about when you can drive or return to work. SEEK MEDICAL CARE IF:  You get light-headed when standing up.  You have drainage (other than a small amount of blood on the dressing).  You have chills.  You have a fever.  You have redness, warmth, swelling, or pain at the insertion site. SEEK IMMEDIATE MEDICAL CARE IF:   You develop chest pain or shortness of breath, feel faint, or pass out.  You have bleeding, swelling larger than a walnut, or drainage from the catheter insertion site.  You develop  pain, discoloration, coldness, or severe bruising in the leg or arm that held the catheter.  You develop bleeding from any other place, such as the bowels. You may see bright red blood in your urine or stools, or your stools may appear black and tarry.  You have heavy bleeding from the site. If this happens, hold pressure on the site. MAKE SURE YOU:  Understand these instructions.  Will watch your condition.  Will get help right away if you are not doing well or get worse. Document Released: 08/10/2004 Document Revised: 06/08/2013 Document Reviewed: 06/16/2012 [facility] Patient Information 2015 [place] [place]. This information is not intended to replace advice given to you by your health care provider. Make sure you discuss any questions you have with your health care provider.                         Excuse from Work, School, or Physical Activity Amanda DarkMary Hood needs to be excused from: _____ Work until Monday February 01, 2014  _____ Physical activity restricted to lifting less than 5 lbs for 5 days. _____ Per DR. Eden Hood she may return to work on 02-02-14 with no restrictions. Caregiver's signature: ________________________________________

## 2014-01-26 NOTE — CV Procedure (Signed)
   Cardiac Catheterization Procedure Note  Name: Amanda SaltsMary A Hood MRN: [id number] DOB: 05/15/1955  Procedure: Left Heart Cath, Selective Coronary Angiography, LV angiography  Indication:  Dyspne Vascular disease history of MI   Procedural details: The right groin was prepped, draped, and anesthetized with 1% lidocaine. Using modified Seldinger technique, a 5 French sheath was introduced into the right femoral artery. Standard Judkins catheters were used for coronary angiography and left ventriculography. Catheter exchanges were performed over a guidewire. There were no immediate procedural complications. The patient was transferred to the post catheterization recovery area for further monitoring.  Procedural Findings: Hemodynamics:  AO   122 62  LV 121/ 3   Coronary angiography: Coronary dominance: right  Left mainstem: Normal  Left anterior descending (LAD):  60% after first septal at D1 take off.  Diffuse 50-60% mid to distal vessel disease    D1:  Large branching vessel 40-50% multiple discrete lesions  Left circumflex (LCx):  Normal proximally   OM1: 50% tubular disease proximally  OM2: 50% tubular disease proximally  Right coronary artery (RCA):  100% distal occlusion with left to right collaterals  Left ventriculography: Left ventricular systolic function is normal, LVEF is estimated at 55-65%, there is no significant mitral regurgitation   Final Conclusions:   Reviewed films extensively with Dr Eldridge DaceVaranasi.  Concern for how tight mid LAD disease was.  Appeared moderate History of old  MI involving distal RCA.  Initial medical Rx  Can consider FFR LAD and CTO of RCA in future but right now she has dyspnea and not chest pain.  Myovue Would be positive inferiorly with collaterals and not likely LAD.  LV normal indicating viability of inferior wall   Recommendations: Medical Rx  She is a Jehova's witness and may need to stay over night to watch her leg if any signs of hematoma  given body habitus And depth of artery  Had to use long sheath to secure cannulation   Charlton Hawseter Laresha Bacorn 01/26/2014, 11:14 AM
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
## 2014-01-26 NOTE — Progress Notes (Signed)
Site area: rt groin Site Prior to Removal:  Level 0 Pressure Applied For: Manual:   20 minutes  Patient Status During Pull: level 0 Post Pull Site:  Level 0 Post Pull Instructions Given:  yes Post Pull Pulses Present: DP palpable Dressing Applied:  yes Bedrest begins @ 12:00 Comments:

## 2014-01-26 NOTE — Research (Signed)
Bioflow Informed Consent   Subject Name: [name]  Subject met inclusion and exclusion criteria.  The informed consent form, study requirements and expectations were reviewed with the subject and questions and concerns were addressed prior to the signing of the consent form.  The subject verbalized understanding of the trail requirements.  The subject agreed to participate in the Bioflow trial and signed the informed consent.  The informed consent was obtained prior to performance of any protocol-specific procedures for the subject.  A copy of the signed informed consent was given to the subject and a copy was placed in the subject's medical record.  [name] Ano 01/26/2014, 8:10

## 2014-01-26 NOTE — Progress Notes (Signed)
Report taken from Jan Johnson.

## 2014-02-02 ENCOUNTER — Telehealth: Payer: Self-pay | Admitting: Cardiovascular Disease

## 2014-02-02 MED ORDER — CLOPIDOGREL BISULFATE 75 MG PO TABS: 75.0000 mg | ORAL_TABLET | Freq: Every day | ORAL | Status: DC

## 2014-02-02 NOTE — Telephone Encounter (Signed)
PT AWARE  SCRIPT  SENT  VIA  EPIC  .Zack Seal/CY

## 2014-02-02 NOTE — Telephone Encounter (Signed)
Pt c/o medication issue: 1. Name of Medication: Not specified 2. How are you currently taking this medication (dosage and times per day)? Not right now--only taking crestor 3. Are you having a reaction (difficulty breathing--STAT)?  no 4. What is your medication issue? Pt states there is a medication that she is supposed to take// has not gotten a script for it and she is not sure that he is waiting for a follow up appt ( No appt in recalls to follow up) Please call back to clarfify moving forward with care//sr

## 2014-02-02 NOTE — Telephone Encounter (Signed)
PER PT  WAS  SUPPOSE  TO START  NEW MED  NOT  SURE OF  NAME  WILL FORWARD  TO DR Eden EmmsNISHAN  FOR  RECOMMENDATIONS

## 2014-02-02 NOTE — Telephone Encounter (Signed)
plavix 75 mg daily

## 2014-02-11 ENCOUNTER — Ambulatory Visit
Admission: RE | Admit: 2014-02-11 | Discharge: 2014-02-11 | Disposition: A | Discharge status: Home / Self Care | Payer: Managed Care, Other (non HMO) | Source: Ambulatory Visit | Attending: Family Medicine | Admitting: Family Medicine

## 2014-02-11 ENCOUNTER — Other Ambulatory Visit: Payer: Self-pay | Admitting: Family Medicine

## 2014-02-11 DIAGNOSIS — M25431 Effusion, right wrist: Secondary | ICD-10-CM

## 2014-03-03 ENCOUNTER — Other Ambulatory Visit: Payer: Self-pay | Admitting: Orthopedic Surgery

## 2014-03-03 DIAGNOSIS — M25531 Pain in right wrist: Secondary | ICD-10-CM

## 2014-03-05 ENCOUNTER — Ambulatory Visit
Admission: RE | Admit: 2014-03-05 | Discharge: 2014-03-05 | Disposition: A | Discharge status: Home / Self Care | Payer: Worker's Compensation | Source: Ambulatory Visit | Attending: Orthopedic Surgery | Admitting: Orthopedic Surgery

## 2014-03-05 DIAGNOSIS — M25531 Pain in right wrist: Secondary | ICD-10-CM

## 2014-04-09 ENCOUNTER — Encounter (HOSPITAL_COMMUNITY): Payer: Self-pay | Admitting: Emergency Medicine

## 2014-04-09 ENCOUNTER — Inpatient Hospital Stay (HOSPITAL_COMMUNITY)
Admission: EM | Admit: 2014-04-09 | Discharge: 2014-04-11 | DRG: 638 | Disposition: A | Discharge status: Home / Self Care | Payer: Managed Care, Other (non HMO) | Attending: Internal Medicine | Admitting: Internal Medicine

## 2014-04-09 DIAGNOSIS — I2510 Atherosclerotic heart disease of native coronary artery without angina pectoris: Secondary | ICD-10-CM | POA: Diagnosis present

## 2014-04-09 DIAGNOSIS — Z833 Family history of diabetes mellitus: Secondary | ICD-10-CM | POA: Diagnosis not present

## 2014-04-09 DIAGNOSIS — I252 Old myocardial infarction: Secondary | ICD-10-CM

## 2014-04-09 DIAGNOSIS — Z8673 Personal history of transient ischemic attack (TIA), and cerebral infarction without residual deficits: Secondary | ICD-10-CM

## 2014-04-09 DIAGNOSIS — Z79899 Other long term (current) drug therapy: Secondary | ICD-10-CM | POA: Diagnosis not present

## 2014-04-09 DIAGNOSIS — R739 Hyperglycemia, unspecified: Secondary | ICD-10-CM | POA: Diagnosis present

## 2014-04-09 DIAGNOSIS — I152 Hypertension secondary to endocrine disorders: Secondary | ICD-10-CM | POA: Diagnosis present

## 2014-04-09 DIAGNOSIS — Z90710 Acquired absence of both cervix and uterus: Secondary | ICD-10-CM

## 2014-04-09 DIAGNOSIS — Z87891 Personal history of nicotine dependence: Secondary | ICD-10-CM

## 2014-04-09 DIAGNOSIS — Z7982 Long term (current) use of aspirin: Secondary | ICD-10-CM | POA: Diagnosis not present

## 2014-04-09 DIAGNOSIS — E1159 Type 2 diabetes mellitus with other circulatory complications: Secondary | ICD-10-CM | POA: Diagnosis present

## 2014-04-09 DIAGNOSIS — E785 Hyperlipidemia, unspecified: Secondary | ICD-10-CM | POA: Diagnosis present

## 2014-04-09 DIAGNOSIS — N179 Acute kidney failure, unspecified: Secondary | ICD-10-CM | POA: Diagnosis present

## 2014-04-09 DIAGNOSIS — E1100 Type 2 diabetes mellitus with hyperosmolarity without nonketotic hyperglycemic-hyperosmolar coma (NKHHC): Principal | ICD-10-CM

## 2014-04-09 DIAGNOSIS — E861 Hypovolemia: Secondary | ICD-10-CM | POA: Diagnosis present

## 2014-04-09 DIAGNOSIS — I10 Essential (primary) hypertension: Secondary | ICD-10-CM | POA: Diagnosis present

## 2014-04-09 DIAGNOSIS — Z8249 Family history of ischemic heart disease and other diseases of the circulatory system: Secondary | ICD-10-CM | POA: Diagnosis not present

## 2014-04-09 DIAGNOSIS — E039 Hypothyroidism, unspecified: Secondary | ICD-10-CM | POA: Diagnosis present

## 2014-04-09 DIAGNOSIS — E1169 Type 2 diabetes mellitus with other specified complication: Secondary | ICD-10-CM | POA: Diagnosis present

## 2014-04-09 HISTORY — DX: Type 2 diabetes mellitus with hyperosmolarity without nonketotic hyperglycemic-hyperosmolar coma (NKHHC): E11.00

## 2014-04-09 LAB — CBC
HEMATOCRIT: 41.9 % (ref 36.0–46.0)
HEMOGLOBIN: 13.9 g/dL (ref 12.0–15.0)
MCH: 27.9 pg (ref 26.0–34.0)
MCHC: 33.2 g/dL (ref 30.0–36.0)
MCV: 84.1 fL (ref 78.0–100.0)
Platelets: 221 10*3/uL (ref 150–400)
RBC: 4.98 MIL/uL (ref 3.87–5.11)
RDW: 13.1 % (ref 11.5–15.5)
WBC: 4.7 10*3/uL (ref 4.0–10.5)

## 2014-04-09 LAB — COMPREHENSIVE METABOLIC PANEL
ALK PHOS: 135 U/L — AB (ref 39–117)
ALT: 53 U/L — AB (ref 0–35)
ANION GAP: 12 (ref 5–15)
AST: 60 U/L — ABNORMAL HIGH (ref 0–37)
Albumin: 4 g/dL (ref 3.5–5.2)
BUN: 33 mg/dL — AB (ref 6–23)
CALCIUM: 8.8 mg/dL (ref 8.4–10.5)
CHLORIDE: 94 mmol/L — AB (ref 96–112)
CO2: 22 mmol/L (ref 19–32)
CREATININE: 2.01 mg/dL — AB (ref 0.50–1.10)
GFR calc non Af Amer: 26 mL/min — ABNORMAL LOW (ref >90)
GFR, EST AFRICAN AMERICAN: 30 mL/min — AB (ref >90)
Glucose, Bld: 647 mg/dL (ref 70–99)
Potassium: 4.1 mmol/L (ref 3.5–5.1)
Sodium: 128 mmol/L — ABNORMAL LOW (ref 135–145)
Total Bilirubin: 0.8 mg/dL (ref 0.3–1.2)
Total Protein: 7.6 g/dL (ref 6.0–8.3)

## 2014-04-09 LAB — URINALYSIS, ROUTINE W REFLEX MICROSCOPIC
Bilirubin Urine: NEGATIVE
HGB URINE DIPSTICK: NEGATIVE
Ketones, ur: NEGATIVE mg/dL
Leukocytes, UA: NEGATIVE
Nitrite: NEGATIVE
Protein, ur: NEGATIVE mg/dL
Specific Gravity, Urine: 1.027 (ref 1.005–1.030)
UROBILINOGEN UA: 0.2 mg/dL (ref 0.0–1.0)
pH: 5 (ref 5.0–8.0)

## 2014-04-09 LAB — BASIC METABOLIC PANEL
Anion gap: 5 (ref 5–15)
BUN: 28 mg/dL — AB (ref 6–23)
CALCIUM: 7.9 mg/dL — AB (ref 8.4–10.5)
CO2: 22 mmol/L (ref 19–32)
Chloride: 106 mmol/L (ref 96–112)
Creatinine, Ser: 1.5 mg/dL — ABNORMAL HIGH (ref 0.50–1.10)
GFR calc Af Amer: 43 mL/min — ABNORMAL LOW (ref >90)
GFR, EST NON AFRICAN AMERICAN: 37 mL/min — AB (ref >90)
GLUCOSE: 418 mg/dL — AB (ref 70–99)
Potassium: 4.2 mmol/L (ref 3.5–5.1)
Sodium: 133 mmol/L — ABNORMAL LOW (ref 135–145)

## 2014-04-09 LAB — CBG MONITORING, ED
GLUCOSE-CAPILLARY: 425 mg/dL — AB (ref 70–99)
Glucose-Capillary: >600 mg/dL (ref 70–99)
Glucose-Capillary: 575 mg/dL (ref 70–99)

## 2014-04-09 LAB — GLUCOSE, CAPILLARY: Glucose-Capillary: 406 mg/dL — ABNORMAL HIGH (ref 70–99)

## 2014-04-09 LAB — URINE MICROSCOPIC-ADD ON

## 2014-04-09 LAB — MAGNESIUM: MAGNESIUM: 1.7 mg/dL (ref 1.5–2.5)

## 2014-04-09 LAB — LIPASE, BLOOD: LIPASE: 30 U/L (ref 11–59)

## 2014-04-09 MED ORDER — PANTOPRAZOLE SODIUM 40 MG PO TBEC
40.0000 mg | DELAYED_RELEASE_TABLET | Freq: Every day | ORAL | Status: DC
  Administered 2014-04-09 – 2014-04-10 (×2): 40 mg via ORAL
  Filled 2014-04-09 (×4): qty 1

## 2014-04-09 MED ORDER — DEXTROSE-NACL 5-0.45 % IV SOLN
INTRAVENOUS | Status: DC
  Administered 2014-04-10: 05:00:00 via INTRAVENOUS

## 2014-04-09 MED ORDER — POTASSIUM CHLORIDE 10 MEQ/100ML IV SOLN
10.0000 meq | INTRAVENOUS | Status: AC
  Administered 2014-04-09 (×2): 10 meq via INTRAVENOUS
  Filled 2014-04-09 (×2): qty 100

## 2014-04-09 MED ORDER — SODIUM CHLORIDE 0.9 % IV SOLN
INTRAVENOUS | Status: DC
  Administered 2014-04-09: 23:00:00 via INTRAVENOUS

## 2014-04-09 MED ORDER — HYDRALAZINE HCL 20 MG/ML IJ SOLN: 10.0000 mg | Freq: Four times a day (QID) | INTRAMUSCULAR | Status: DC | PRN

## 2014-04-09 MED ORDER — CLOPIDOGREL BISULFATE 75 MG PO TABS: 75.0000 mg | ORAL_TABLET | Freq: Every day | ORAL | Status: DC

## 2014-04-09 MED ORDER — SODIUM CHLORIDE 0.9 % IV BOLUS (SEPSIS)
2000.0000 mL | Freq: Once | INTRAVENOUS | Status: AC
  Administered 2014-04-09: 2000 mL via INTRAVENOUS

## 2014-04-09 MED ORDER — SODIUM CHLORIDE 0.9 % IV BOLUS (SEPSIS)
1000.0000 mL | Freq: Once | INTRAVENOUS | Status: AC
  Administered 2014-04-09: 1000 mL via INTRAVENOUS

## 2014-04-09 MED ORDER — DEXTROSE 50 % IV SOLN: 25.0000 mL | INTRAVENOUS | Status: DC | PRN

## 2014-04-09 MED ORDER — FENTANYL CITRATE 0.05 MG/ML IJ SOLN
25.0000 ug | Freq: Once | INTRAMUSCULAR | Status: AC
  Administered 2014-04-09: 25 ug via INTRAVENOUS
  Filled 2014-04-09: qty 2

## 2014-04-09 MED ORDER — ASPIRIN EC 81 MG PO TBEC
81.0000 mg | DELAYED_RELEASE_TABLET | Freq: Every day | ORAL | Status: DC
  Administered 2014-04-10 – 2014-04-11 (×2): 81 mg via ORAL
  Filled 2014-04-09 (×3): qty 1

## 2014-04-09 MED ORDER — HEPARIN SODIUM (PORCINE) 5000 UNIT/ML IJ SOLN
5000.0000 [IU] | Freq: Three times a day (TID) | INTRAMUSCULAR | Status: DC
  Administered 2014-04-09 – 2014-04-11 (×6): 5000 [IU] via SUBCUTANEOUS
  Filled 2014-04-09 (×7): qty 1

## 2014-04-09 MED ORDER — FENTANYL CITRATE 0.05 MG/ML IJ SOLN
50.0000 ug | Freq: Once | INTRAMUSCULAR | Status: AC
  Administered 2014-04-09: 50 ug via INTRAVENOUS
  Filled 2014-04-09: qty 2

## 2014-04-09 MED ORDER — SODIUM CHLORIDE 0.9 % IV SOLN
INTRAVENOUS | Status: DC
  Administered 2014-04-09: 3.5 [IU]/h via INTRAVENOUS
  Filled 2014-04-09: qty 2.5

## 2014-04-09 MED ORDER — INSULIN REGULAR BOLUS VIA INFUSION
0.0000 [IU] | Freq: Three times a day (TID) | INTRAVENOUS | Status: DC
  Administered 2014-04-10: 4.4 [IU] via INTRAVENOUS
  Filled 2014-04-09: qty 10

## 2014-04-09 NOTE — ED Notes (Signed)
Pt from home c/o hyperglycemia "for a while" Her CBG was 501 at home. Dry mouth present.
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
## 2014-04-09 NOTE — Progress Notes (Signed)
EDCM spoke to patient at bedside.  Patient confirms she has [employer] insurance with pcp Dr. Mila PalmerSharon Wolters.

## 2014-04-09 NOTE — ED Notes (Signed)
Bed: WA07 Expected date:  Expected time:  Means of arrival:  Comments: HOLD for T2 hyperglycemia
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
## 2014-04-09 NOTE — ED Notes (Signed)
CBG registered HIGH on ED Glucometer 

## 2014-04-10 LAB — BASIC METABOLIC PANEL
Anion gap: 5 (ref 5–15)
Anion gap: 6 (ref 5–15)
BUN: 26 mg/dL — ABNORMAL HIGH (ref 6–23)
BUN: 21 mg/dL (ref 6–23)
CO2: 25 mmol/L (ref 19–32)
CO2: 25 mmol/L (ref 19–32)
Calcium: 8.1 mg/dL — ABNORMAL LOW (ref 8.4–10.5)
Calcium: 8.3 mg/dL — ABNORMAL LOW (ref 8.4–10.5)
Chloride: 104 mmol/L (ref 96–112)
Chloride: 107 mmol/L (ref 96–112)
Creatinine, Ser: 1.33 mg/dL — ABNORMAL HIGH (ref 0.50–1.10)
Creatinine, Ser: 1.14 mg/dL — ABNORMAL HIGH (ref 0.50–1.10)
GFR calc Af Amer: 50 mL/min — ABNORMAL LOW (ref >90)
GFR calc Af Amer: 60 mL/min — ABNORMAL LOW (ref >90)
GFR calc non Af Amer: 43 mL/min — ABNORMAL LOW (ref >90)
GFR calc non Af Amer: 52 mL/min — ABNORMAL LOW (ref >90)
Glucose, Bld: 311 mg/dL — ABNORMAL HIGH (ref 70–99)
Glucose, Bld: 186 mg/dL — ABNORMAL HIGH (ref 70–99)
Potassium: 3.7 mmol/L (ref 3.5–5.1)
Potassium: 3.6 mmol/L (ref 3.5–5.1)
Sodium: 134 mmol/L — ABNORMAL LOW (ref 135–145)
Sodium: 138 mmol/L (ref 135–145)

## 2014-04-10 LAB — GLUCOSE, CAPILLARY
GLUCOSE-CAPILLARY: 268 mg/dL — AB (ref 70–99)
GLUCOSE-CAPILLARY: 148 mg/dL — AB (ref 70–99)
GLUCOSE-CAPILLARY: 163 mg/dL — AB (ref 70–99)
GLUCOSE-CAPILLARY: 296 mg/dL — AB (ref 70–99)
GLUCOSE-CAPILLARY: 269 mg/dL — AB (ref 70–99)
GLUCOSE-CAPILLARY: 304 mg/dL — AB (ref 70–99)
Glucose-Capillary: 176 mg/dL — ABNORMAL HIGH (ref 70–99)
Glucose-Capillary: 205 mg/dL — ABNORMAL HIGH (ref 70–99)
Glucose-Capillary: 214 mg/dL — ABNORMAL HIGH (ref 70–99)
Glucose-Capillary: 228 mg/dL — ABNORMAL HIGH (ref 70–99)
Glucose-Capillary: 268 mg/dL — ABNORMAL HIGH (ref 70–99)
Glucose-Capillary: 300 mg/dL — ABNORMAL HIGH (ref 70–99)
Glucose-Capillary: 307 mg/dL — ABNORMAL HIGH (ref 70–99)
Glucose-Capillary: 325 mg/dL — ABNORMAL HIGH (ref 70–99)

## 2014-04-10 MED ORDER — INSULIN ASPART PROT & ASPART (70-30 MIX) 100 UNIT/ML ~~LOC~~ SUSP
15.0000 [IU] | Freq: Every day | SUBCUTANEOUS | Status: DC
Start: 2014-04-10 — End: 2014-04-10

## 2014-04-10 MED ORDER — ZOLPIDEM TARTRATE 5 MG PO TABS: 5.0000 mg | ORAL_TABLET | Freq: Every evening | ORAL | Status: DC | PRN

## 2014-04-10 MED ORDER — INSULIN ASPART 100 UNIT/ML ~~LOC~~ SOLN
5.0000 [IU] | Freq: Once | SUBCUTANEOUS | Status: AC
  Administered 2014-04-10: 5 [IU] via SUBCUTANEOUS

## 2014-04-10 MED ORDER — METFORMIN HCL 500 MG PO TABS
500.0000 mg | ORAL_TABLET | Freq: Two times a day (BID) | ORAL | Status: DC
  Administered 2014-04-10 (×2): 500 mg via ORAL
  Filled 2014-04-10 (×4): qty 1

## 2014-04-10 MED ORDER — ROSUVASTATIN CALCIUM 20 MG PO TABS
20.0000 mg | ORAL_TABLET | Freq: Every day | ORAL | Status: DC
  Administered 2014-04-10 – 2014-04-11 (×2): 20 mg via ORAL
  Filled 2014-04-10 (×2): qty 1

## 2014-04-10 MED ORDER — INSULIN ASPART PROT & ASPART (70-30 MIX) 100 UNIT/ML ~~LOC~~ SUSP: 25.0000 [IU] | Freq: Every day | SUBCUTANEOUS | Status: DC

## 2014-04-10 MED ORDER — FENTANYL CITRATE 0.05 MG/ML IJ SOLN
50.0000 ug | Freq: Once | INTRAMUSCULAR | Status: AC
  Administered 2014-04-10: 50 ug via INTRAVENOUS
  Filled 2014-04-10: qty 2

## 2014-04-10 MED ORDER — LEVOTHYROXINE SODIUM 100 MCG PO TABS
100.0000 ug | ORAL_TABLET | Freq: Every day | ORAL | Status: DC
  Administered 2014-04-11: 100 ug via ORAL
  Filled 2014-04-10 (×2): qty 1

## 2014-04-10 MED ORDER — AMLODIPINE BESYLATE 10 MG PO TABS
10.0000 mg | ORAL_TABLET | Freq: Every day | ORAL | Status: DC
  Administered 2014-04-10 – 2014-04-11 (×2): 10 mg via ORAL
  Filled 2014-04-10 (×2): qty 1

## 2014-04-10 MED ORDER — CLOPIDOGREL BISULFATE 75 MG PO TABS: 75.0000 mg | ORAL_TABLET | Freq: Every day | ORAL | Status: DC

## 2014-04-10 MED ORDER — HYDROCODONE-ACETAMINOPHEN 5-325 MG PO TABS
1.0000 | ORAL_TABLET | Freq: Four times a day (QID) | ORAL | Status: DC | PRN
Start: 2014-04-10 — End: 2014-04-11
  Administered 2014-04-11: 1 via ORAL
  Filled 2014-04-10 (×2): qty 1

## 2014-04-10 MED ORDER — INSULIN ASPART PROT & ASPART (70-30 MIX) 100 UNIT/ML ~~LOC~~ SUSP
20.0000 [IU] | Freq: Every day | SUBCUTANEOUS | Status: DC
  Administered 2014-04-10: 20 [IU] via SUBCUTANEOUS
  Filled 2014-04-10: qty 10

## 2014-04-10 MED ORDER — INSULIN ASPART PROT & ASPART (70-30 MIX) 100 UNIT/ML ~~LOC~~ SUSP
25.0000 [IU] | Freq: Every day | SUBCUTANEOUS | Status: DC
Start: 2014-04-10 — End: 2014-04-11
  Administered 2014-04-10: 25 [IU] via SUBCUTANEOUS

## 2014-04-10 NOTE — Progress Notes (Signed)
Patient's CBGs have been steadily increasing today despite being on insulin drip and receiving 70/30 insulin. Last CBGs were 228, 296, and 307. Dr. Robb Matarrtiz made aware, said it was still ok to turn off insulin drip 1 hour after 70/30 given. Will continue to monitor.

## 2014-04-10 NOTE — Progress Notes (Signed)
[facility] PROGRESS NOTE    Assessment/Plan: Hyperglycemia/Diabetic hyperosmolar non-ketotic state - Start on insulin drip now less than 200, and transition her to insulin 70/30. - She moved candidate for metformin I will go ahead and start metformin. - To check CBGs before meals and at bedtime.  Hypothyroidism: - Continue Synthroid.  Essential hypertension - Her blood pressures trending up she is on 2 diuretics at home recently and her antihypertensive medication except for Lasix.  AKI (acute kidney injury) - Multifactorial due to hypovolemia and ongoing use of diuretic and ACE inhibitor. Now resolved.  Hyperlipidemia - Cont statins.    Code Status: full Family Communication: none  Disposition Plan: inpatient, home in am   Consultants:  noen  Procedures:  none  Antibiotics:  none (indicate start date, and stop date if known)  HPI/Subjective: No complains  Objective: Filed Vitals:   04/09/14 2120 04/09/14 2224 04/09/14 2310 04/10/14 0602  BP: 118/67 121/60  135/82  Pulse: 60 64  61  Temp: 98.4 F (36.9 C) 97.7 F (36.5 C)  97.7 F (36.5 C)  TempSrc: Oral Oral  Oral  Resp: Height:    (1.651 m)   Weight:   113.535 kg (250 lb 4.8 oz)   SpO2: 95% 95%  95%    Intake/Output Summary (Last 24 hours) at 04/10/14 0905 Last data filed at 04/10/14 0658  Gross per 24 hour  Intake   1323 ml  Output      0 ml  Net   1323 ml   Filed Weights   04/09/14 2310  Weight: 113.535 kg (250 lb 4.8 oz)    Exam:  General: Alert, awake, oriented x3, in no acute distress.  HEENT: No bruits, no goiter.  Heart: Regular rate and rhythm. Lungs: Good air movement, clear Abdomen: Soft, nontender, nondistended, positive bowel sounds.  Neuro: Grossly intact, nonfocal.   Data Reviewed: Basic Metabolic Panel:  Recent Labs Lab 04/09/14 1537 04/09/14 2228 04/10/14 0215 04/10/14 0541  NA 128* 133* 134* 138  K 4.1 4.2 3.7 3.6  CL 94* 106  104 107  CO2 GLUCOSE 647* 418* 311* 186*  BUN 33* 28* 26* 21  CREATININE 2.01* 1.50* 1.33* 1.14*  CALCIUM 8.8 7.9* 8.1* 8.3*  MG  --  1.7  --   --    Liver Function Tests:  Recent Labs Lab 04/09/14 1537  AST 60*  ALT 53*  ALKPHOS 135*  BILITOT 0.8  PROT 7.6  ALBUMIN 4.0    Recent Labs Lab 04/09/14 1703  LIPASE 30   No results for input(s): AMMONIA in the last 168 hours. CBC:  Recent Labs Lab 04/09/14 1537  WBC 4.7  HGB 13.9  HCT 41.9  MCV 84.1  PLT 221   Cardiac Enzymes: No results for input(s): CKTOTAL, CKMB, CKMBINDEX, TROPONINI in the last 168 hours. BNP (last 3 results) No results for input(s): BNP in the last 8760 hours.  ProBNP (last 3 results) No results for input(s): PROBNP in the last 8760 hours.  CBG:  Recent Labs Lab 04/10/14 0220 04/10/14 0325 04/10/14 0430 04/10/14 0536 04/10/14 0640  GLUCAP 268* 214* 205* 176* 148*    No results found for this or any previous visit (from the past 240 hour(s)).   Studies: No results found.  Scheduled Meds: . aspirin EC  81 mg Oral Daily  . heparin subcutaneous  5,000 Units Subcutaneous 3 times per day  . insulin regular  0-10 Units Intravenous TID WC  . pantoprazole  40 mg Oral Daily   Continuous Infusions: . sodium chloride Stopped (04/10/14 0433)  . dextrose 5 % and 0.45% NaCl 75 mL/hr at 04/10/14 0433  . insulin (NOVOLIN-R) infusion 1 Units/hr (04/10/14 0750)     Marinda ElkFELIZ ORTIZ, Amayiah Gosnell  [facility] Pager [phone]. If 7PM-7AM, please contact night-coverage at [email or web address], password [email or web address] 04/10/2014, 9:05 AM  LOS: 1 day

## 2014-04-11 LAB — GLUCOSE, CAPILLARY
GLUCOSE-CAPILLARY: 286 mg/dL — AB (ref 70–99)
GLUCOSE-CAPILLARY: 260 mg/dL — AB (ref 70–99)
Glucose-Capillary: 414 mg/dL — ABNORMAL HIGH (ref 70–99)
Glucose-Capillary: 341 mg/dL — ABNORMAL HIGH (ref 70–99)

## 2014-04-11 MED ORDER — METFORMIN HCL 850 MG PO TABS
850.0000 mg | ORAL_TABLET | Freq: Two times a day (BID) | ORAL | Status: DC
  Administered 2014-04-11: 850 mg via ORAL
  Filled 2014-04-11: qty 1

## 2014-04-11 MED ORDER — INSULIN ASPART PROT & ASPART (70-30 MIX) 100 UNIT/ML ~~LOC~~ SUSP
30.0000 [IU] | Freq: Every day | SUBCUTANEOUS | Status: DC
  Administered 2014-04-11: 30 [IU] via SUBCUTANEOUS

## 2014-04-11 MED ORDER — INSULIN ASPART PROT & ASPART (70-30 MIX) 100 UNIT/ML ~~LOC~~ SUSP: 40.0000 [IU] | Freq: Every day | SUBCUTANEOUS | Status: DC

## 2014-04-11 MED ORDER — INSULIN ASPART 100 UNIT/ML ~~LOC~~ SOLN
8.0000 [IU] | Freq: Once | SUBCUTANEOUS | Status: AC
  Administered 2014-04-11: 8 [IU] via SUBCUTANEOUS

## 2014-04-11 MED ORDER — METFORMIN HCL 850 MG PO TABS: 850.0000 mg | ORAL_TABLET | Freq: Two times a day (BID) | ORAL | Status: DC

## 2014-04-11 MED ORDER — METFORMIN HCL 850 MG PO TABS
850.0000 mg | ORAL_TABLET | Freq: Two times a day (BID) | ORAL | Status: DC
  Filled 2014-04-11: qty 1

## 2014-04-11 MED ORDER — INSULIN NPH ISOPHANE & REGULAR (70-30) 100 UNIT/ML ~~LOC~~ SUSP: 30.0000 [IU] | Freq: Every day | SUBCUTANEOUS | Status: DC

## 2014-04-11 MED ORDER — INSULIN ASPART PROT & ASPART (70-30 MIX) 100 UNIT/ML ~~LOC~~ SUSP: 35.0000 [IU] | Freq: Every day | SUBCUTANEOUS | Status: DC

## 2014-04-11 MED ORDER — INSULIN ASPART PROT & ASPART (70-30 MIX) 100 UNIT/ML ~~LOC~~ SUSP
40.0000 [IU] | Freq: Every day | SUBCUTANEOUS | Status: DC
  Administered 2014-04-11: 40 [IU] via SUBCUTANEOUS

## 2014-04-11 MED ORDER — INSULIN NPH ISOPHANE & REGULAR (70-30) 100 UNIT/ML ~~LOC~~ SUSP: 40.0000 [IU] | Freq: Every day | SUBCUTANEOUS | Status: DC

## 2014-04-11 MED ORDER — INSULIN ASPART 100 UNIT/ML ~~LOC~~ SOLN: 5.0000 [IU] | Freq: Once | SUBCUTANEOUS | Status: DC

## 2014-04-11 NOTE — Discharge Summary (Addendum)
Physician Discharge Summary  Amanda SaltsMary A Velie ONG:[id number] DOB: 03/03/1955 DOA: 04/09/2014  PCP: Emeterio ReeveWOLTERS,SHARON A, MD  Admit date: 04/09/2014 Discharge date: 04/11/2014  Time spent:30 minutes  Recommendations for Outpatient Follow-up:  1. Follow up with PCP for diabetes follow up. Her GFR > 40 ml/min, which makes a good candidate for metformin.   Discharge Diagnoses:  Principal Problem:   Diabetic hyperosmolar non-ketotic state Active Problems:   Hyperlipidemia   CAD (coronary artery disease)   AKI (acute kidney injury)   Hyperglycemia   Hypothyroidism   Hypertension   Discharge Condition: stable  Diet recommendation: carb modified  Filed Weights   04/09/14 2310  Weight: 113.535 kg (250 lb 4.8 oz)    History of present illness:  59 y.o. female with a history of CAD S/P Cardiac Cath with Angioplasty 01/27/2015, HTN, DM2, Hyperlipidemia and previous CVA who presents to the ED with complaints of increased blood sugars over the past week. She reports that her glucose level today at home was 501. She reports that her glucose levels had been trending upward since December after her cardiac catheterization. She denies any fevers or chills  Hospital Course:  Hyperglycemia/Diabetic hyperosmolar non-ketotic state - Start on insulin drip when less than 200, and transition her to insulin 70/30. - She is a good candidate for metformin I will go ahead and start metformin. Her GFR > 40 Ml/min - I have also increased her insulin. - the most likely cause of HONK was not compliance. She kept saying in the hospital that plavix cause her BG to be high. - I have increase her 70/30 to 40 in ma and 30 units in pm.  Hypothyroidism: - Continue Synthroid.  Essential hypertension - Her blood pressures trending up she is on 2 diuretics. - stop her lasix and cont her current regimen. - her BP stabilize will need to follow up with PCP.  AKI (acute kidney injury) - Multifactorial due to  hypovolemia and ongoing use of diuretic and ACE inhibitor. Now resolved.  Hyperlipidemia - Cont statins.  CAD: - Pt relates she no longer takes plavix. - Cont ASA, atenolol and statins. - Have d/c lasix as she is on 2 diuretics.  Procedures:  None  Consultations:  none  Discharge Exam: Filed Vitals:   04/11/14 0519  BP: 155/86  Pulse: 74  Temp: 97.8 F (36.6 C)  Resp: 18    General: A&O x3 Cardiovascular: RRR Respiratory: good air movement CTA B/L  Discharge Instructions   Discharge Instructions    Diet - low sodium heart healthy    Complete by:  As directed      Increase activity slowly    Complete by:  As directed           Current Discharge Medication List    START taking these medications   Details  metFORMIN (GLUCOPHAGE) 850 MG tablet Take 1 tablet (850 mg total) by mouth 2 (two) times daily with a meal. Qty: 60 tablet, Refills: 3      CONTINUE these medications which have CHANGED   Details  !! insulin NPH-regular Human (NOVOLIN 70/30) (70-30) 100 UNIT/ML injection Inject 40 Units into the skin daily with breakfast. Qty: 10 mL, Refills: 11    !! insulin NPH-regular Human (NOVOLIN 70/30) (70-30) 100 UNIT/ML injection Inject 30 Units into the skin daily with supper. Qty: 10 mL, Refills: 11     !! - Potential duplicate medications found. Please discuss with provider.    CONTINUE these medications which have NOT  CHANGED   Details  amLODipine (NORVASC) 10 MG tablet Take 10 mg by mouth at bedtime. Refills: 3    aspirin EC 81 MG tablet Take 81 mg by mouth daily.    atenolol-chlorthalidone (TENORETIC) 100-25 MG per tablet Take 1 tablet by mouth daily.    gabapentin (NEURONTIN) 300 MG capsule Take 300 mg by mouth 2 (two) times daily.    HYDROcodone-acetaminophen (NORCO/VICODIN) 5-325 MG per tablet 1 to 2 tabs every 4 to 6 hours as needed for pain. Qty: 20 tablet, Refills: 0    KLOR-CON M10 10 MEQ tablet Take 10 mEq by mouth 2 (two) times  daily. Refills: 3    levothyroxine (SYNTHROID, LEVOTHROID) 100 MCG tablet Take 100 mcg by mouth daily before breakfast.    rosuvastatin (CRESTOR) 20 MG tablet Take 20 mg by mouth daily with breakfast.     albuterol (PROVENTIL HFA;VENTOLIN HFA) 108 (90 BASE) MCG/ACT inhaler Inhale 2 puffs into the lungs every 4 (four) hours as needed for wheezing or shortness of breath. Qty: 1 Inhaler, Refills: 0    cyclobenzaprine (FLEXERIL) 5 MG tablet Take 1 tablet (5 mg total) by mouth 3 (three) times daily as needed for muscle spasms. Qty: 30 tablet, Refills: 0    lisinopril (PRINIVIL) 20 MG tablet Take 1 tablet (20 mg total) by mouth daily. Qty: 30 tablet, Refills: 0    zolpidem (AMBIEN) 10 MG tablet Take 10 mg by mouth at bedtime as needed for sleep.       STOP taking these medications     furosemide (LASIX) 20 MG tablet      clopidogrel (PLAVIX) 75 MG tablet        Allergies  Allergen Reactions  . Strawberry Anaphylaxis  . Cymbalta [Duloxetine Hcl] Other (See Comments)    dizzy  . Lunesta [Eszopiclone]     Bad taste in mouth   . Other     Pt is a Jehovah Witness. No blood products.  . Penicillins Other (See Comments)    unknown  . Tramadol     unknown  . Trazodone And Nefazodone     Shakes       The results of significant diagnostics from this hospitalization (including imaging, microbiology, ancillary and laboratory) are listed below for reference.    Significant Diagnostic Studies: No results found.  Microbiology: No results found for this or any previous visit (from the past 240 hour(s)).   Labs: Basic Metabolic Panel:  Recent Labs Lab 04/09/14 1537 04/09/14 2228 04/10/14 0215 04/10/14 0541  NA 128* 133* 134* 138  K 4.1 4.2 3.7 3.6  CL 94* 106 104 107  CO2 GLUCOSE 647* 418* 311* 186*  BUN 33* 28* 26* 21  CREATININE 2.01* 1.50* 1.33* 1.14*  CALCIUM 8.8 7.9* 8.1* 8.3*  MG  --  1.7  --   --    Liver Function Tests:  Recent Labs Lab  04/09/14 1537  AST 60*  ALT 53*  ALKPHOS 135*  BILITOT 0.8  PROT 7.6  ALBUMIN 4.0    Recent Labs Lab 04/09/14 1703  LIPASE 30   No results for input(s): AMMONIA in the last 168 hours. CBC:  Recent Labs Lab 04/09/14 1537  WBC 4.7  HGB 13.9  HCT 41.9  MCV 84.1  PLT 221   Cardiac Enzymes: No results for input(s): CKTOTAL, CKMB, CKMBINDEX, TROPONINI in the last 168 hours. BNP: BNP (last 3 results) No results for input(s): BNP in the last 8760 hours.  ProBNP (last 3 results) No results for input(s): PROBNP in the last 8760 hours.  CBG:  Recent Labs Lab 04/10/14 1053 04/10/14 1159 04/10/14 1651 04/10/14 2111 04/11/14 0725  GLUCAP 307* 268* 269* 304* 414*       Signed:  Marinda Elk  [facility] 04/11/2014, 12:52 PM

## 2014-04-11 NOTE — Progress Notes (Signed)
At beginning of shift, pt requested new MD. Dr. Robb Matarrtiz made aware and wrote orders for discharge based on the decline of patient's blood sugars. [id number] patient's CBG was 260, down from over 400 this AM. Per Dr. Robb Matarrtiz, patient was ready for discharge. Patient made aware of this, refused discharge. Dr. Robb Matarrtiz, Charge RN and [facility] aware of this situation.

## 2014-04-12 LAB — GLUCOSE, CAPILLARY
GLUCOSE-CAPILLARY: 285 mg/dL — AB (ref 70–99)
Glucose-Capillary: 291 mg/dL — ABNORMAL HIGH (ref 70–99)

## 2014-05-10 ENCOUNTER — Ambulatory Visit: Payer: Managed Care, Other (non HMO) | Admitting: Surgery

## 2014-05-10 ENCOUNTER — Other Ambulatory Visit (HOSPITAL_COMMUNITY): Payer: Managed Care, Other (non HMO)

## 2014-05-10 ENCOUNTER — Encounter (HOSPITAL_COMMUNITY): Payer: Managed Care, Other (non HMO)

## 2014-05-14 ENCOUNTER — Encounter: Payer: Self-pay | Admitting: Surgery

## 2014-05-17 ENCOUNTER — Encounter (HOSPITAL_COMMUNITY): Payer: Managed Care, Other (non HMO)

## 2014-05-17 ENCOUNTER — Ambulatory Visit: Payer: Managed Care, Other (non HMO) | Admitting: Surgery

## 2014-05-17 ENCOUNTER — Other Ambulatory Visit (HOSPITAL_COMMUNITY): Payer: Managed Care, Other (non HMO)

## 2014-05-31 ENCOUNTER — Other Ambulatory Visit: Payer: Self-pay

## 2014-05-31 DIAGNOSIS — Z1231 Encounter for screening mammogram for malignant neoplasm of breast: Secondary | ICD-10-CM

## 2014-06-07 ENCOUNTER — Ambulatory Visit: Payer: Self-pay

## 2014-06-15 ENCOUNTER — Ambulatory Visit
Admission: RE | Admit: 2014-06-15 | Discharge: 2014-06-15 | Disposition: A | Discharge status: Home / Self Care | Payer: Managed Care, Other (non HMO) | Source: Ambulatory Visit

## 2014-06-15 ENCOUNTER — Ambulatory Visit: Payer: Self-pay

## 2014-06-15 DIAGNOSIS — Z1231 Encounter for screening mammogram for malignant neoplasm of breast: Secondary | ICD-10-CM

## 2014-06-20 ENCOUNTER — Encounter (HOSPITAL_COMMUNITY): Payer: Self-pay | Admitting: Family Medicine

## 2014-06-20 ENCOUNTER — Emergency Department (INDEPENDENT_AMBULATORY_CARE_PROVIDER_SITE_OTHER)
Admission: EM | Admit: 2014-06-20 | Discharge: 2014-06-20 | Disposition: A | Discharge status: Home / Self Care | Payer: Managed Care, Other (non HMO) | Source: Home / Self Care | Attending: Family Medicine | Admitting: Family Medicine

## 2014-06-20 DIAGNOSIS — N289 Disorder of kidney and ureter, unspecified: Secondary | ICD-10-CM | POA: Diagnosis not present

## 2014-06-20 DIAGNOSIS — M62830 Muscle spasm of back: Secondary | ICD-10-CM

## 2014-06-20 DIAGNOSIS — R05 Cough: Secondary | ICD-10-CM

## 2014-06-20 DIAGNOSIS — R059 Cough, unspecified: Secondary | ICD-10-CM

## 2014-06-20 MED ORDER — BENZONATATE 100 MG PO CAPS: 100.0000 mg | ORAL_CAPSULE | Freq: Three times a day (TID) | ORAL | Status: DC | PRN

## 2014-06-20 MED ORDER — METHOCARBAMOL 500 MG PO TABS: 500.0000 mg | ORAL_TABLET | Freq: Four times a day (QID) | ORAL | Status: DC | PRN

## 2014-06-20 NOTE — Discharge Instructions (Signed)
The back pain is coming from spasms of the muscle. There is no evidence of significant or permanent injury. Please remember to perform daily stretches, apply heat to the affected area and massage. Please use the muscle relaxers as needed for additional benefit. Please use Tylenol 1000 mg every 8 hours as needed for additional relief. Please start taking a daily allergy pill and use the Tessalon Perles as needed for additional cough relief.
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
## 2014-06-20 NOTE — ED Notes (Signed)
Pt c/o low back pain and spams x 2 weeks, cough since yesterday and HA

## 2014-06-25 ENCOUNTER — Encounter: Payer: Self-pay | Admitting: Surgery

## 2014-06-28 ENCOUNTER — Inpatient Hospital Stay (HOSPITAL_COMMUNITY): Admission: RE | Admit: 2014-06-28 | Payer: Managed Care, Other (non HMO) | Source: Ambulatory Visit

## 2014-06-28 ENCOUNTER — Ambulatory Visit: Payer: Managed Care, Other (non HMO) | Admitting: Surgery

## 2014-06-28 ENCOUNTER — Encounter (HOSPITAL_COMMUNITY): Payer: Managed Care, Other (non HMO)

## 2014-08-02 ENCOUNTER — Other Ambulatory Visit: Payer: Self-pay

## 2014-08-24 ENCOUNTER — Encounter: Payer: Self-pay | Admitting: Cardiology

## 2014-11-09 ENCOUNTER — Encounter (HOSPITAL_COMMUNITY): Payer: Self-pay | Admitting: Emergency Medicine

## 2014-11-09 ENCOUNTER — Emergency Department (INDEPENDENT_AMBULATORY_CARE_PROVIDER_SITE_OTHER)
Admission: EM | Admit: 2014-11-09 | Discharge: 2014-11-09 | Disposition: A | Discharge status: Home / Self Care | Payer: Self-pay | Source: Home / Self Care | Attending: Emergency Medicine | Admitting: Emergency Medicine

## 2014-11-09 DIAGNOSIS — M25562 Pain in left knee: Secondary | ICD-10-CM

## 2014-11-09 MED ORDER — METHYLPREDNISOLONE ACETATE 40 MG/ML IJ SUSP
INTRAMUSCULAR | Status: AC
  Filled 2014-11-09: qty 1

## 2014-11-09 MED ORDER — NAPROXEN 500 MG PO TABS: 500.0000 mg | ORAL_TABLET | Freq: Two times a day (BID) | ORAL | Status: DC

## 2014-11-09 MED ORDER — LIDOCAINE HCL (PF) 2 % IJ SOLN
INTRAMUSCULAR | Status: AC
  Filled 2014-11-09: qty 2

## 2014-11-09 MED ORDER — DICLOFENAC SODIUM 75 MG PO TBEC: 75.0000 mg | DELAYED_RELEASE_TABLET | Freq: Two times a day (BID) | ORAL | Status: DC
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
## 2014-11-09 NOTE — ED Notes (Addendum)
Knee sleeve did not fit the patient.  Two ace wraps will be applied instead, per Dr. Piedad Climes

## 2014-11-09 NOTE — ED Notes (Signed)
Pt states she has had pain and swelling in her knee since yesterday.  She denies any injury to the knee, but states she recently started a new job where she is standing on her feet for eight hours on hard concrete.

## 2014-11-09 NOTE — Discharge Instructions (Signed)
You have strain of the patellar tendon and bursa in your knee. We did a steroid injection today. This will take a day or 2 to reach full effect. Please apply ice to your knee at least 3 times a day. Avoid heat. Take diclofenac twice a day for the next 3 days, then as needed. If things are not improving in the next week, please follow-up with the orthopedic doctor, Dr. Shon Baton.

## 2014-11-10 ENCOUNTER — Emergency Department (HOSPITAL_COMMUNITY): Payer: Self-pay

## 2014-11-10 ENCOUNTER — Encounter (HOSPITAL_COMMUNITY): Payer: Self-pay | Admitting: Emergency Medicine

## 2014-11-10 ENCOUNTER — Emergency Department (HOSPITAL_COMMUNITY)
Admission: EM | Admit: 2014-11-10 | Discharge: 2014-11-10 | Disposition: A | Discharge status: Home / Self Care | Payer: Self-pay | Attending: Emergency Medicine | Admitting: Emergency Medicine

## 2014-11-10 DIAGNOSIS — F419 Anxiety disorder, unspecified: Secondary | ICD-10-CM | POA: Insufficient documentation

## 2014-11-10 DIAGNOSIS — I2510 Atherosclerotic heart disease of native coronary artery without angina pectoris: Secondary | ICD-10-CM | POA: Insufficient documentation

## 2014-11-10 DIAGNOSIS — Z7982 Long term (current) use of aspirin: Secondary | ICD-10-CM | POA: Insufficient documentation

## 2014-11-10 DIAGNOSIS — Z794 Long term (current) use of insulin: Secondary | ICD-10-CM | POA: Insufficient documentation

## 2014-11-10 DIAGNOSIS — Z79899 Other long term (current) drug therapy: Secondary | ICD-10-CM | POA: Insufficient documentation

## 2014-11-10 DIAGNOSIS — Z87891 Personal history of nicotine dependence: Secondary | ICD-10-CM | POA: Insufficient documentation

## 2014-11-10 DIAGNOSIS — Z880 Allergy status to penicillin: Secondary | ICD-10-CM | POA: Insufficient documentation

## 2014-11-10 DIAGNOSIS — E039 Hypothyroidism, unspecified: Secondary | ICD-10-CM | POA: Insufficient documentation

## 2014-11-10 DIAGNOSIS — M1712 Unilateral primary osteoarthritis, left knee: Secondary | ICD-10-CM | POA: Insufficient documentation

## 2014-11-10 DIAGNOSIS — E119 Type 2 diabetes mellitus without complications: Secondary | ICD-10-CM | POA: Insufficient documentation

## 2014-11-10 DIAGNOSIS — I10 Essential (primary) hypertension: Secondary | ICD-10-CM | POA: Insufficient documentation

## 2014-11-10 DIAGNOSIS — G8929 Other chronic pain: Secondary | ICD-10-CM | POA: Insufficient documentation

## 2014-11-10 DIAGNOSIS — I252 Old myocardial infarction: Secondary | ICD-10-CM | POA: Insufficient documentation

## 2014-11-10 DIAGNOSIS — M25562 Pain in left knee: Secondary | ICD-10-CM

## 2014-11-10 DIAGNOSIS — Z8673 Personal history of transient ischemic attack (TIA), and cerebral infarction without residual deficits: Secondary | ICD-10-CM | POA: Insufficient documentation

## 2014-11-10 MED ORDER — NAPROXEN 500 MG PO TABS: 500.0000 mg | ORAL_TABLET | Freq: Two times a day (BID) | ORAL | Status: DC | PRN

## 2014-11-10 MED ORDER — OXYCODONE-ACETAMINOPHEN 5-325 MG PO TABS
1.0000 | ORAL_TABLET | Freq: Once | ORAL | Status: AC
  Administered 2014-11-10: 1 via ORAL
  Filled 2014-11-10: qty 1

## 2014-11-10 MED ORDER — IBUPROFEN 200 MG PO TABS
600.0000 mg | ORAL_TABLET | Freq: Once | ORAL | Status: AC
  Administered 2014-11-10: 600 mg via ORAL
  Filled 2014-11-10: qty 3

## 2014-11-10 NOTE — ED Notes (Signed)
Pt to ED with complaint of knee pain x1 day, reports swelling and tenderness to right knee. Pt was seen at [facility] yesterday and was given a cortisone injection, which she reports has made the pain worse.

## 2014-11-10 NOTE — ED Notes (Signed)
Pt verbalizes understanding of discharge instructions and follow up information. NAD. Ambulatory with crutches. A/O x4.

## 2014-11-10 NOTE — Progress Notes (Signed)
Orthopedic Tech Progress Note Patient Details:  [name] 1955/11/29 [id number]  Ortho Devices Type of Ortho Device: Crutches Ortho Device/Splint Interventions: Application   [name] 11/10/2014, 8:45 AM
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
## 2014-11-10 NOTE — Discharge Instructions (Signed)

## 2016-02-10 ENCOUNTER — Ambulatory Visit
Admission: RE | Admit: 2016-02-10 | Discharge: 2016-02-10 | Disposition: A | Discharge status: Home / Self Care | Payer: BLUE CROSS/BLUE SHIELD | Source: Ambulatory Visit | Attending: Family Medicine | Admitting: Family Medicine

## 2016-02-10 ENCOUNTER — Other Ambulatory Visit: Payer: Self-pay | Admitting: Family Medicine

## 2016-02-10 DIAGNOSIS — M48061 Spinal stenosis, lumbar region without neurogenic claudication: Secondary | ICD-10-CM

## 2016-02-14 ENCOUNTER — Encounter (HOSPITAL_COMMUNITY): Payer: Self-pay

## 2016-02-14 ENCOUNTER — Emergency Department (HOSPITAL_COMMUNITY)
Admission: EM | Admit: 2016-02-14 | Discharge: 2016-02-14 | Disposition: A | Discharge status: Home / Self Care | Payer: BLUE CROSS/BLUE SHIELD | Attending: Emergency Medicine | Admitting: Emergency Medicine

## 2016-02-14 DIAGNOSIS — I252 Old myocardial infarction: Secondary | ICD-10-CM | POA: Insufficient documentation

## 2016-02-14 DIAGNOSIS — Z794 Long term (current) use of insulin: Secondary | ICD-10-CM | POA: Diagnosis not present

## 2016-02-14 DIAGNOSIS — Z79899 Other long term (current) drug therapy: Secondary | ICD-10-CM | POA: Insufficient documentation

## 2016-02-14 DIAGNOSIS — T886XXA Anaphylactic reaction due to adverse effect of correct drug or medicament properly administered, initial encounter: Secondary | ICD-10-CM | POA: Diagnosis not present

## 2016-02-14 DIAGNOSIS — I10 Essential (primary) hypertension: Secondary | ICD-10-CM | POA: Insufficient documentation

## 2016-02-14 DIAGNOSIS — Z8673 Personal history of transient ischemic attack (TIA), and cerebral infarction without residual deficits: Secondary | ICD-10-CM | POA: Insufficient documentation

## 2016-02-14 DIAGNOSIS — T370X5A Adverse effect of sulfonamides, initial encounter: Secondary | ICD-10-CM | POA: Insufficient documentation

## 2016-02-14 DIAGNOSIS — E119 Type 2 diabetes mellitus without complications: Secondary | ICD-10-CM | POA: Diagnosis not present

## 2016-02-14 DIAGNOSIS — E039 Hypothyroidism, unspecified: Secondary | ICD-10-CM | POA: Insufficient documentation

## 2016-02-14 DIAGNOSIS — Z87891 Personal history of nicotine dependence: Secondary | ICD-10-CM | POA: Diagnosis not present

## 2016-02-14 DIAGNOSIS — I2510 Atherosclerotic heart disease of native coronary artery without angina pectoris: Secondary | ICD-10-CM | POA: Insufficient documentation

## 2016-02-14 DIAGNOSIS — Y731 Therapeutic (nonsurgical) and rehabilitative gastroenterology and urology devices associated with adverse incidents: Secondary | ICD-10-CM | POA: Insufficient documentation

## 2016-02-14 DIAGNOSIS — Z7982 Long term (current) use of aspirin: Secondary | ICD-10-CM | POA: Insufficient documentation

## 2016-02-14 DIAGNOSIS — T783XXA Angioneurotic edema, initial encounter: Secondary | ICD-10-CM

## 2016-02-14 DIAGNOSIS — T782XXA Anaphylactic shock, unspecified, initial encounter: Secondary | ICD-10-CM

## 2016-02-14 LAB — CBC WITH DIFFERENTIAL/PLATELET
BASOS PCT: 2 %
Basophils Absolute: 0.1 10*3/uL (ref 0.0–0.1)
EOS ABS: 0.1 10*3/uL (ref 0.0–0.7)
EOS PCT: 2 %
HCT: 44.2 % (ref 36.0–46.0)
Hemoglobin: 15 g/dL (ref 12.0–15.0)
Lymphocytes Relative: 26 %
Lymphs Abs: 1.2 10*3/uL (ref 0.7–4.0)
MCH: 27.8 pg (ref 26.0–34.0)
MCHC: 33.9 g/dL (ref 30.0–36.0)
MCV: 81.9 fL (ref 78.0–100.0)
Monocytes Absolute: 0.5 10*3/uL (ref 0.1–1.0)
Monocytes Relative: 11 %
Neutro Abs: 2.9 10*3/uL (ref 1.7–7.7)
Neutrophils Relative %: 59 %
Platelets: 230 10*3/uL (ref 150–400)
RBC: 5.4 MIL/uL — ABNORMAL HIGH (ref 3.87–5.11)
RDW: 13.6 % (ref 11.5–15.5)
WBC: 4.8 10*3/uL (ref 4.0–10.5)

## 2016-02-14 LAB — BASIC METABOLIC PANEL
Anion gap: 11 (ref 5–15)
BUN: 19 mg/dL (ref 6–20)
CHLORIDE: 106 mmol/L (ref 101–111)
CO2: 20 mmol/L — ABNORMAL LOW (ref 22–32)
CREATININE: 1.55 mg/dL — AB (ref 0.44–1.00)
Calcium: 9.6 mg/dL (ref 8.9–10.3)
GFR calc Af Amer: 41 mL/min — ABNORMAL LOW (ref >60)
GFR calc non Af Amer: 35 mL/min — ABNORMAL LOW (ref >60)
GLUCOSE: 184 mg/dL — AB (ref 65–99)
Potassium: 3.9 mmol/L (ref 3.5–5.1)
Sodium: 137 mmol/L (ref 135–145)

## 2016-02-14 MED ORDER — PREDNISONE 20 MG PO TABS
40.0000 mg | ORAL_TABLET | Freq: Every day | ORAL | 0 refills | Status: AC
Start: 2016-02-14 — End: 2016-02-17

## 2016-02-14 MED ORDER — DIPHENHYDRAMINE HCL 50 MG/ML IJ SOLN: 25.0000 mg | Freq: Once | INTRAMUSCULAR | Status: DC

## 2016-02-14 MED ORDER — CIPROFLOXACIN HCL 250 MG PO TABS: 250.0000 mg | ORAL_TABLET | Freq: Two times a day (BID) | ORAL | 0 refills | Status: AC

## 2016-02-14 MED ORDER — EPINEPHRINE 0.3 MG/0.3ML IJ SOAJ
0.3000 mg | Freq: Once | INTRAMUSCULAR | Status: AC
  Administered 2016-02-14: 0.3 mg via INTRAMUSCULAR
  Filled 2016-02-14: qty 0.3

## 2016-02-14 MED ORDER — DIPHENHYDRAMINE HCL 25 MG PO CAPS
25.0000 mg | ORAL_CAPSULE | Freq: Once | ORAL | Status: AC
  Administered 2016-02-14: 25 mg via ORAL
  Filled 2016-02-14: qty 1

## 2016-02-14 MED ORDER — FAMOTIDINE 20 MG PO TABS
20.0000 mg | ORAL_TABLET | Freq: Once | ORAL | Status: AC
  Administered 2016-02-14: 20 mg via ORAL
  Filled 2016-02-14: qty 1

## 2016-02-14 MED ORDER — DIPHENHYDRAMINE HCL 50 MG/ML IJ SOLN
25.0000 mg | Freq: Once | INTRAMUSCULAR | Status: AC
  Administered 2016-02-14: 25 mg via INTRAVENOUS
  Filled 2016-02-14: qty 1

## 2016-02-14 MED ORDER — SODIUM CHLORIDE 0.9 % IV BOLUS (SEPSIS)
1000.0000 mL | Freq: Once | INTRAVENOUS | Status: AC
  Administered 2016-02-14: 1000 mL via INTRAVENOUS

## 2016-02-14 MED ORDER — DIPHENHYDRAMINE HCL 25 MG PO CAPS: 25.0000 mg | ORAL_CAPSULE | Freq: Four times a day (QID) | ORAL | 0 refills | Status: DC
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
## 2016-02-14 NOTE — ED Notes (Signed)
Spoke with Doctor regarding medication benadryl.

## 2016-02-14 NOTE — ED Triage Notes (Addendum)
Pt is here with angioedema to upper lip that she noticed this morning and she is also itching "all over" her body. She reports taking Lisinopril for a few years. Pt is able to speak in complete sentences but reports trouble swallowing.

## 2016-02-14 NOTE — ED Provider Notes (Signed)
  Physical Exam  BP 142/78   Pulse (!) 58   Temp 99 F (37.2 C) (Oral)   Resp 16   SpO2 96%   Physical Exam  Constitutional: She appears well-developed and well-nourished. No distress.  HENT:  Head:    Mouth/Throat: Oropharynx is clear and moist.  Eyes: Conjunctivae are normal.  Pulmonary/Chest: Effort normal and breath sounds normal. No respiratory distress. She has no wheezes.  Abdominal: Soft. There is no tenderness.  Neurological: She is alert.  Skin: Rash noted. Rash is urticarial.       ED Course  Procedures  Medications  diphenhydrAMINE (BENADRYL) capsule 25 mg (25 mg Oral Given 02/14/16 1412)  famotidine (PEPCID) tablet 20 mg (20 mg Oral Given 02/14/16 1412)  sodium chloride 0.9 % bolus 1,000 mL (1,000 mLs Intravenous New Bag/Given 02/14/16 1542)  diphenhydrAMINE (BENADRYL) injection 25 mg (25 mg Intravenous Given 02/14/16 1542)  EPINEPHrine (EPI-PEN) injection 0.3 mg (0.3 mg Intramuscular Given 02/14/16 1600)    MDM Patient is a 61 year old female who presents with likely anaphylaxis to Bactrim with upper lip swelling and uric area. Patient was given 2 doses of Benadryl, IV fluids, Pepcid. I took over care from Dr. Donetta PottsNagpal at 1600. Patient is given 1 dose of epinephrine to help with the lip swelling. Patient's symptoms continued to improve during her ED stay. Lip swelling improved and the urticaria has nearly resolved. Next  Bactrim will be discontinued now we'll switch her to Keflex for her UTI she is being treated for. Patient also to discontinue lisinopril and will follow up with PCP later this week to start a new blood pressure medicine. Patient told to avoid any sulfa drugs and Bactrim in the future. I will also put the patient on 3 days of steroids and 24 hours of continued Benadryl. Return precautions given. Patient is felt stable for discharge. Case discussed with attending, Dr. Judd Lienelo.         Dwana Melenaobin Ruvi Fullenwider, DO 02/14/16 1720    Geoffery Lyonsouglas Delo, MD 02/14/16 [phone]

## 2016-02-14 NOTE — Discharge Instructions (Addendum)
Stop your lisinopril and bactrim immediately. Follow-up with your doctor to discuss further management of your blood pressure and whether or not you will require a new medication to take care of your blood pressure. Your prescribed a different antibiotic called Ciprofloxin. Please take this for 3 days to finish out treatment for your UTI. Please take the prescribed steroids and Benadryl as well to help your symptoms improved. Carefully monitor your blood sugar while on steroids.

## 2016-04-16 ENCOUNTER — Encounter (HOSPITAL_COMMUNITY): Payer: Self-pay | Admitting: Emergency Medicine

## 2016-04-16 ENCOUNTER — Ambulatory Visit (HOSPITAL_COMMUNITY)
Admission: EM | Admit: 2016-04-16 | Discharge: 2016-04-16 | Disposition: A | Discharge status: Home / Self Care | Payer: BLUE CROSS/BLUE SHIELD | Attending: Emergency Medicine | Admitting: Emergency Medicine

## 2016-04-16 DIAGNOSIS — I10 Essential (primary) hypertension: Secondary | ICD-10-CM

## 2016-04-16 DIAGNOSIS — R0602 Shortness of breath: Secondary | ICD-10-CM

## 2016-04-16 LAB — POCT I-STAT, CHEM 8
BUN: 16 mg/dL (ref 6–20)
Calcium, Ion: 1.09 mmol/L — ABNORMAL LOW (ref 1.15–1.40)
Chloride: 103 mmol/L (ref 101–111)
Creatinine, Ser: 0.9 mg/dL (ref 0.44–1.00)
Glucose, Bld: 149 mg/dL — ABNORMAL HIGH (ref 65–99)
HEMATOCRIT: 46 % (ref 36.0–46.0)
Hemoglobin: 15.6 g/dL — ABNORMAL HIGH (ref 12.0–15.0)
Potassium: 3.5 mmol/L (ref 3.5–5.1)
Sodium: 138 mmol/L (ref 135–145)
TCO2: 24 mmol/L (ref 0–100)

## 2016-04-16 NOTE — Discharge Instructions (Signed)
Your calcium level is 1.09. It is not uncommon to have the symptoms ear describing whenever the blood calcium level is less than 1.1. I recommend taking an over-the-counter calcium supplement daily, and to follow up with your primary care provider in a week for recheck of your labs and to monitor your symptoms.

## 2016-04-16 NOTE — ED Triage Notes (Signed)
[facility] a complaint of bilateral arm and hand pain and tingling as well as shortness of breath x 4 days.
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
## 2016-08-16 ENCOUNTER — Ambulatory Visit (INDEPENDENT_AMBULATORY_CARE_PROVIDER_SITE_OTHER): Payer: BLUE CROSS/BLUE SHIELD

## 2016-08-16 ENCOUNTER — Encounter (HOSPITAL_COMMUNITY): Payer: Self-pay | Admitting: Emergency Medicine

## 2016-08-16 ENCOUNTER — Ambulatory Visit (HOSPITAL_COMMUNITY)
Admission: EM | Admit: 2016-08-16 | Discharge: 2016-08-16 | Disposition: A | Discharge status: Home / Self Care | Payer: BLUE CROSS/BLUE SHIELD | Attending: Emergency Medicine | Admitting: Emergency Medicine

## 2016-08-16 DIAGNOSIS — G5790 Unspecified mononeuropathy of unspecified lower limb: Secondary | ICD-10-CM | POA: Diagnosis not present

## 2016-08-16 DIAGNOSIS — E1140 Type 2 diabetes mellitus with diabetic neuropathy, unspecified: Secondary | ICD-10-CM | POA: Diagnosis not present

## 2016-08-16 DIAGNOSIS — M79672 Pain in left foot: Secondary | ICD-10-CM | POA: Diagnosis not present

## 2016-08-16 MED ORDER — GABAPENTIN 300 MG PO CAPS
300.0000 mg | ORAL_CAPSULE | Freq: Three times a day (TID) | ORAL | 0 refills | Status: DC
Start: 2016-08-16 — End: 2016-10-01

## 2016-08-16 NOTE — ED Notes (Signed)
Patient verbalized understanding of discharge instructions and denies any further needs or questions at this time. VS stable. Patient escorted to her car in parking lot.
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
## 2016-08-16 NOTE — ED Triage Notes (Signed)
Pt c/o bilateral peripheral neuropathy pain x2 weeks .... Also reports a piece of glass lodged in left foot x3 days  Sts her daughter broke a lid from her pot and she stepped on the glass  BS this am was 205 fasting  Pain increases w/activity  .... On feel all day at work.

## 2016-09-17 ENCOUNTER — Other Ambulatory Visit: Payer: Self-pay | Admitting: Family Medicine

## 2016-09-17 DIAGNOSIS — M79672 Pain in left foot: Secondary | ICD-10-CM

## 2016-09-23 ENCOUNTER — Ambulatory Visit
Admission: RE | Admit: 2016-09-23 | Discharge: 2016-09-23 | Disposition: A | Discharge status: Home / Self Care | Payer: BLUE CROSS/BLUE SHIELD | Source: Ambulatory Visit | Attending: Family Medicine | Admitting: Family Medicine

## 2016-09-23 DIAGNOSIS — M79672 Pain in left foot: Secondary | ICD-10-CM

## 2016-10-01 ENCOUNTER — Encounter (HOSPITAL_COMMUNITY): Payer: Self-pay | Admitting: Emergency Medicine

## 2016-10-01 ENCOUNTER — Observation Stay (HOSPITAL_COMMUNITY): Payer: BLUE CROSS/BLUE SHIELD

## 2016-10-01 ENCOUNTER — Emergency Department (HOSPITAL_COMMUNITY): Payer: BLUE CROSS/BLUE SHIELD

## 2016-10-01 ENCOUNTER — Inpatient Hospital Stay (HOSPITAL_COMMUNITY)
Admission: EM | Admit: 2016-10-01 | Discharge: 2016-10-05 | DRG: 390 | Disposition: A | Discharge status: Home / Self Care | Payer: BLUE CROSS/BLUE SHIELD | Attending: Internal Medicine | Admitting: Internal Medicine

## 2016-10-01 DIAGNOSIS — E669 Obesity, unspecified: Secondary | ICD-10-CM | POA: Diagnosis present

## 2016-10-01 DIAGNOSIS — Z87891 Personal history of nicotine dependence: Secondary | ICD-10-CM

## 2016-10-01 DIAGNOSIS — Z888 Allergy status to other drugs, medicaments and biological substances status: Secondary | ICD-10-CM

## 2016-10-01 DIAGNOSIS — F419 Anxiety disorder, unspecified: Secondary | ICD-10-CM | POA: Diagnosis present

## 2016-10-01 DIAGNOSIS — Z79899 Other long term (current) drug therapy: Secondary | ICD-10-CM

## 2016-10-01 DIAGNOSIS — K56609 Unspecified intestinal obstruction, unspecified as to partial versus complete obstruction: Secondary | ICD-10-CM | POA: Diagnosis not present

## 2016-10-01 DIAGNOSIS — E039 Hypothyroidism, unspecified: Secondary | ICD-10-CM | POA: Diagnosis present

## 2016-10-01 DIAGNOSIS — E785 Hyperlipidemia, unspecified: Secondary | ICD-10-CM | POA: Diagnosis present

## 2016-10-01 DIAGNOSIS — E1151 Type 2 diabetes mellitus with diabetic peripheral angiopathy without gangrene: Secondary | ICD-10-CM | POA: Diagnosis present

## 2016-10-01 DIAGNOSIS — Z886 Allergy status to analgesic agent status: Secondary | ICD-10-CM

## 2016-10-01 DIAGNOSIS — Z8673 Personal history of transient ischemic attack (TIA), and cerebral infarction without residual deficits: Secondary | ICD-10-CM

## 2016-10-01 DIAGNOSIS — Z880 Allergy status to penicillin: Secondary | ICD-10-CM

## 2016-10-01 DIAGNOSIS — I252 Old myocardial infarction: Secondary | ICD-10-CM

## 2016-10-01 DIAGNOSIS — Z7982 Long term (current) use of aspirin: Secondary | ICD-10-CM

## 2016-10-01 DIAGNOSIS — Z794 Long term (current) use of insulin: Secondary | ICD-10-CM

## 2016-10-01 DIAGNOSIS — Z882 Allergy status to sulfonamides status: Secondary | ICD-10-CM

## 2016-10-01 DIAGNOSIS — Z90710 Acquired absence of both cervix and uterus: Secondary | ICD-10-CM

## 2016-10-01 DIAGNOSIS — Z6837 Body mass index (BMI) 37.0-37.9, adult: Secondary | ICD-10-CM

## 2016-10-01 DIAGNOSIS — I2510 Atherosclerotic heart disease of native coronary artery without angina pectoris: Secondary | ICD-10-CM | POA: Diagnosis present

## 2016-10-01 DIAGNOSIS — E876 Hypokalemia: Secondary | ICD-10-CM

## 2016-10-01 DIAGNOSIS — E1142 Type 2 diabetes mellitus with diabetic polyneuropathy: Secondary | ICD-10-CM | POA: Diagnosis present

## 2016-10-01 DIAGNOSIS — R112 Nausea with vomiting, unspecified: Secondary | ICD-10-CM | POA: Diagnosis not present

## 2016-10-01 DIAGNOSIS — I10 Essential (primary) hypertension: Secondary | ICD-10-CM | POA: Diagnosis present

## 2016-10-01 LAB — CBC
HCT: 44.3 % (ref 36.0–46.0)
Hemoglobin: 15.5 g/dL — ABNORMAL HIGH (ref 12.0–15.0)
MCH: 28.4 pg (ref 26.0–34.0)
MCHC: 35 g/dL (ref 30.0–36.0)
MCV: 81.1 fL (ref 78.0–100.0)
Platelets: 216 10*3/uL (ref 150–400)
RBC: 5.46 MIL/uL — ABNORMAL HIGH (ref 3.87–5.11)
RDW: 13.3 % (ref 11.5–15.5)
WBC: 4.5 10*3/uL (ref 4.0–10.5)

## 2016-10-01 LAB — GLUCOSE, CAPILLARY
GLUCOSE-CAPILLARY: 81 mg/dL (ref 65–99)
GLUCOSE-CAPILLARY: 110 mg/dL — AB (ref 65–99)
GLUCOSE-CAPILLARY: 98 mg/dL (ref 65–99)

## 2016-10-01 LAB — COMPREHENSIVE METABOLIC PANEL
ALBUMIN: 4 g/dL (ref 3.5–5.0)
ALK PHOS: 79 U/L (ref 38–126)
ALT: 38 U/L (ref 14–54)
ANION GAP: 11 (ref 5–15)
AST: 39 U/L (ref 15–41)
BILIRUBIN TOTAL: 1.1 mg/dL (ref 0.3–1.2)
BUN: 16 mg/dL (ref 6–20)
CALCIUM: 9.5 mg/dL (ref 8.9–10.3)
CO2: 24 mmol/L (ref 22–32)
Chloride: 98 mmol/L — ABNORMAL LOW (ref 101–111)
Creatinine, Ser: 0.93 mg/dL (ref 0.44–1.00)
GFR calc Af Amer: >60 mL/min (ref >60)
GLUCOSE: 216 mg/dL — AB (ref 65–99)
Potassium: 2.9 mmol/L — ABNORMAL LOW (ref 3.5–5.1)
Sodium: 133 mmol/L — ABNORMAL LOW (ref 135–145)
TOTAL PROTEIN: 8.1 g/dL (ref 6.5–8.1)

## 2016-10-01 LAB — CREATININE, SERUM: CREATININE: 0.92 mg/dL (ref 0.44–1.00)

## 2016-10-01 LAB — LIPASE, BLOOD: Lipase: 25 U/L (ref 11–51)

## 2016-10-01 LAB — MAGNESIUM: Magnesium: 1.7 mg/dL (ref 1.7–2.4)

## 2016-10-01 MED ORDER — PANTOPRAZOLE SODIUM 40 MG IV SOLR
40.0000 mg | INTRAVENOUS | Status: DC
  Administered 2016-10-01 – 2016-10-04 (×4): 40 mg via INTRAVENOUS
  Filled 2016-10-01 (×4): qty 40

## 2016-10-01 MED ORDER — ONDANSETRON HCL 4 MG/2ML IJ SOLN
4.0000 mg | Freq: Once | INTRAMUSCULAR | Status: AC
  Administered 2016-10-01: 4 mg via INTRAVENOUS
  Filled 2016-10-01: qty 2

## 2016-10-01 MED ORDER — INSULIN ASPART 100 UNIT/ML ~~LOC~~ SOLN
0.0000 [IU] | SUBCUTANEOUS | Status: DC
  Administered 2016-10-02 – 2016-10-03 (×10): 1 [IU] via SUBCUTANEOUS
  Administered 2016-10-04 (×2): 2 [IU] via SUBCUTANEOUS
  Administered 2016-10-04 (×3): 1 [IU] via SUBCUTANEOUS
  Administered 2016-10-04 – 2016-10-05 (×4): 2 [IU] via SUBCUTANEOUS

## 2016-10-01 MED ORDER — LEVOTHYROXINE SODIUM 100 MCG IV SOLR
50.0000 ug | Freq: Every day | INTRAVENOUS | Status: DC
  Administered 2016-10-02 – 2016-10-05 (×4): 50 ug via INTRAVENOUS
  Filled 2016-10-01 (×4): qty 5

## 2016-10-01 MED ORDER — HYDRALAZINE HCL 20 MG/ML IJ SOLN
5.0000 mg | INTRAMUSCULAR | Status: DC | PRN
  Filled 2016-10-01: qty 0.25

## 2016-10-01 MED ORDER — ONDANSETRON HCL 4 MG/2ML IJ SOLN
4.0000 mg | Freq: Four times a day (QID) | INTRAMUSCULAR | Status: DC | PRN
  Administered 2016-10-01 – 2016-10-03 (×6): 4 mg via INTRAVENOUS
  Filled 2016-10-01 (×7): qty 2

## 2016-10-01 MED ORDER — ENOXAPARIN SODIUM 40 MG/0.4ML ~~LOC~~ SOLN
40.0000 mg | SUBCUTANEOUS | Status: DC
  Administered 2016-10-01 – 2016-10-04 (×4): 40 mg via SUBCUTANEOUS
  Filled 2016-10-01 (×4): qty 0.4

## 2016-10-01 MED ORDER — SODIUM CHLORIDE 0.9 % IV SOLN
INTRAVENOUS | Status: DC
  Administered 2016-10-01 – 2016-10-02 (×3): via INTRAVENOUS

## 2016-10-01 MED ORDER — DIPHENHYDRAMINE HCL 50 MG/ML IJ SOLN
12.5000 mg | Freq: Three times a day (TID) | INTRAMUSCULAR | Status: DC | PRN
  Administered 2016-10-01 – 2016-10-05 (×4): 12.5 mg via INTRAVENOUS
  Filled 2016-10-01 (×4): qty 1

## 2016-10-01 MED ORDER — KETOROLAC TROMETHAMINE 30 MG/ML IJ SOLN
15.0000 mg | Freq: Four times a day (QID) | INTRAMUSCULAR | Status: AC
  Administered 2016-10-01 – 2016-10-02 (×4): 15 mg via INTRAVENOUS
  Filled 2016-10-01 (×4): qty 1

## 2016-10-01 MED ORDER — MENTHOL 3 MG MT LOZG
1.0000 | LOZENGE | OROMUCOSAL | Status: DC | PRN
  Administered 2016-10-01 – 2016-10-03 (×2): 3 mg via ORAL
  Filled 2016-10-01: qty 9

## 2016-10-01 MED ORDER — IOPAMIDOL (ISOVUE-300) INJECTION 61%
INTRAVENOUS | Status: AC
  Administered 2016-10-01: 100 mL via INTRAVENOUS
  Filled 2016-10-01: qty 100

## 2016-10-01 MED ORDER — MORPHINE SULFATE (PF) 2 MG/ML IV SOLN
1.0000 mg | INTRAVENOUS | Status: DC | PRN
  Administered 2016-10-01 – 2016-10-02 (×7): 2 mg via INTRAVENOUS
  Filled 2016-10-01 (×7): qty 1

## 2016-10-01 MED ORDER — MORPHINE SULFATE (PF) 4 MG/ML IV SOLN
4.0000 mg | Freq: Once | INTRAVENOUS | Status: AC
  Administered 2016-10-01: 4 mg via INTRAVENOUS
  Filled 2016-10-01: qty 1

## 2016-10-01 MED ORDER — LABETALOL HCL 5 MG/ML IV SOLN
5.0000 mg | INTRAVENOUS | Status: DC | PRN
  Filled 2016-10-01: qty 4

## 2016-10-01 MED ORDER — POTASSIUM CHLORIDE 10 MEQ/100ML IV SOLN
10.0000 meq | Freq: Once | INTRAVENOUS | Status: AC
  Administered 2016-10-01: 10 meq via INTRAVENOUS
  Filled 2016-10-01: qty 100

## 2016-10-01 MED ORDER — DIATRIZOATE MEGLUMINE & SODIUM 66-10 % PO SOLN
90.0000 mL | Freq: Once | ORAL | Status: AC
  Administered 2016-10-02: 90 mL via ORAL
  Filled 2016-10-01: qty 90

## 2016-10-01 MED ORDER — POTASSIUM CHLORIDE 10 MEQ/100ML IV SOLN
10.0000 meq | INTRAVENOUS | Status: AC
  Administered 2016-10-01 (×3): 10 meq via INTRAVENOUS
  Filled 2016-10-01 (×3): qty 100

## 2016-10-01 MED ORDER — POTASSIUM CHLORIDE 10 MEQ/100ML IV SOLN
10.0000 meq | INTRAVENOUS | Status: AC
  Administered 2016-10-01 (×4): 10 meq via INTRAVENOUS
  Filled 2016-10-01 (×5): qty 100

## 2016-10-01 MED ORDER — IOPAMIDOL (ISOVUE-300) INJECTION 61%
100.0000 mL | Freq: Once | INTRAVENOUS | Status: AC | PRN
  Administered 2016-10-01: 100 mL via INTRAVENOUS

## 2016-10-01 MED ORDER — KETOROLAC TROMETHAMINE 30 MG/ML IJ SOLN: 30.0000 mg | Freq: Once | INTRAMUSCULAR | Status: DC

## 2016-10-01 NOTE — ED Triage Notes (Signed)
Pt family reports that pt has been having vomiting and abd pain for the last 2 hours. Denies any diarrhea. Pt family reports pt having hx of bowel obstruction.

## 2016-10-01 NOTE — ED Notes (Signed)
NG tube advanced 14 cm per x-ray
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
## 2016-10-01 NOTE — Progress Notes (Signed)
Lab on unit to recollect CBC from previous questionable results at 1347. Labs have been drawn 3 times since patient has arrived  on the unit. Patient is requesting to have labs deferred to time of next draw am labs.
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
## 2016-10-01 NOTE — ED Notes (Signed)
Pt transported to CT ?
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
## 2016-10-01 NOTE — Progress Notes (Signed)
Received patient form ED to 1335.  Vital signs stable, no complaint of pain.  Call bell within reach and bed in lowest position prior to departing room.

## 2016-10-01 NOTE — ED Notes (Signed)
Pt stated she could not urinate at this time.

## 2016-10-01 NOTE — ED Notes (Signed)
ED TO INPATIENT HANDOFF REPORT  Name/Age/Gender [name] 61 y.o. female  Code Status Code Status History    Date Active Date Inactive Code Status Order ID Comments User Context   01/26/2014  1:06 PM 01/26/2014  8:25 PM Full Code [id number]  Josue Hector, MD Inpatient   01/08/2013 12:33 PM 01/09/2013  4:42 PM Full Code [id number]  Ulyses Amor, PA-C Inpatient   12/03/2012 12:49 AM 12/04/2012  7:48 PM Full Code [id number]  Louellen Molder, MD Inpatient   12/03/2012 12:49 AM 12/03/2012 12:49 AM Full Code [id number]  Louellen Molder, MD Inpatient   02/09/2012 12:44 AM 02/15/2012  5:13 PM Full Code [id number]  Mortimer Fries, RN Inpatient   12/14/2011 12:04 AM 12/17/2011  5:56 PM Full Code [id number]  Theressa Millard, MD ED      Home/SNF/Other Home  Chief Complaint Abdominal Pain;Emesis  Level of Care/Admitting Diagnosis ED Disposition    ED Disposition Condition [place]: [facility] [[id number]]  Level of Care: Med-Surg [16]  Diagnosis: SBO (small bowel obstruction) [facility]) [[id number]]  Admitting Physician: Dessa Phi [facility] [[id number]]  Attending Physician: Shon Millet [[id number]]  PT Class (Do Not Modify): Observation [104]  PT Acc Code (Do Not Modify): Observation [10022]       Medical History Past Medical History:  Diagnosis Date  . Anxiety   . CAD (coronary artery disease)   . Carotid artery occlusion   . Chronic pain following surgery or procedure 2008   Abdominal pain  . Diabetes mellitus   . Headache(784.0)   . Hypertension    stress test- scheduled for 12/30/2012  . Hypothyroidism   . Myocardial infarction ([place]) 2000  . Shortness of breath   . Stroke ([place]) 11/2012   tingling in L arm , slurred speech- came to ER    Allergies Allergies  Allergen Reactions  . Strawberry Extract Anaphylaxis  . Cymbalta [Duloxetine Hcl] Other (See Comments)    Dizzy   . Lisinopril Itching and Swelling     Angioedema   . Lunesta [Eszopiclone] Other (See Comments)    Bad taste in mouth   . Metformin And Related Other (See Comments)    Damaged patient's kidneys  . Other Other (See Comments)    Pt is a Jehovah Witness. No blood products.  . Sulfa Antibiotics Itching and Swelling    Angioedema   . Tramadol Other (See Comments)    Hallucinations   . Trazodone And Nefazodone Other (See Comments)    Shakes   . Penicillins Rash and Other (See Comments)    "Paralyzed" (per patient) Has patient had a PCN reaction causing immediate rash, facial/tongue/throat swelling, SOB or lightheadedness with hypotension: Yes Has patient had a PCN reaction causing severe rash involving mucus membranes or skin necrosis: No Has patient had a PCN reaction that required hospitalization Yes Has patient had a PCN reaction occurring within the last 10 years: No If all of the above answers are "NO", then may proceed with Cephalosporin use.     IV Location/Drains/Wounds Patient Lines/Drains/Airways Status   Active Line/Drains/Airways    Name:   Placement date:   Placement time:   Site:   Days:   Peripheral IV 10/01/16 Right Hand  10/01/16    0610    Hand    less than 1   NG/OG Tube Nasogastric 16 Fr. Right nare Xray  10/01/16    0956    Right nare    less  than 1          Labs/Imaging Results for orders placed or performed during the hospital encounter of 10/01/16 (from the past 48 hour(s))  Lipase, blood     Status: None   Collection Time: 10/01/16  5:57 AM  Result Value Ref Range   Lipase 25 11 - 51 U/L  Comprehensive metabolic panel     Status: Abnormal   Collection Time: 10/01/16  5:57 AM  Result Value Ref Range   Sodium 133 (L) 135 - 145 mmol/L   Potassium 2.9 (L) 3.5 - 5.1 mmol/L   Chloride 98 (L) 101 - 111 mmol/L   CO2 24 22 - 32 mmol/L   Glucose, Bld 216 (H) 65 - 99 mg/dL   BUN 16 6 - 20 mg/dL   Creatinine, Ser 0.93 0.44 - 1.00 mg/dL   Calcium 9.5 8.9 - 10.3 mg/dL   Total Protein 8.1 6.5 -  8.1 g/dL   Albumin 4.0 3.5 - 5.0 g/dL   AST 39 15 - 41 U/L   ALT 38 14 - 54 U/L   Alkaline Phosphatase 79 38 - 126 U/L   Total Bilirubin 1.1 0.3 - 1.2 mg/dL   GFR calc non Af Amer >60 >60 mL/min   GFR calc Af Amer >60 >60 mL/min    Comment: (NOTE) The eGFR has been calculated using the CKD EPI equation. This calculation has not been validated in all clinical situations. eGFR's persistently <60 mL/min signify possible Chronic Kidney Disease.    Anion gap 11 5 - 15  CBC     Status: Abnormal   Collection Time: 10/01/16  5:57 AM  Result Value Ref Range   WBC 4.5 4.0 - 10.5 K/uL   RBC 5.46 (H) 3.87 - 5.11 MIL/uL   Hemoglobin 15.5 (H) 12.0 - 15.0 g/dL   HCT 44.3 36.0 - 46.0 %   MCV 81.1 78.0 - 100.0 fL   MCH 28.4 26.0 - 34.0 pg   MCHC 35.0 30.0 - 36.0 g/dL   RDW 13.3 11.5 - 15.5 %   Platelets 216 150 - 400 K/uL  Magnesium     Status: None   Collection Time: 10/01/16  5:57 AM  Result Value Ref Range   Magnesium 1.7 1.7 - 2.4 mg/dL   Ct Abdomen Pelvis W Contrast  Result Date: 10/01/2016 CLINICAL DATA:  61 year old female with vomiting and diffuse abdominal pain for several hours. History of bowel obstruction. EXAM: CT ABDOMEN AND PELVIS WITH CONTRAST TECHNIQUE: Multidetector CT imaging of the abdomen and pelvis was performed using the standard protocol following bolus administration of intravenous contrast. CONTRAST:  100 mL Isovue-300 COMPARISON:  05/27/2013, 02/08/2012 FINDINGS: Lower chest: No acute abnormality. Hepatobiliary: No focal liver abnormality is seen. No gallstones, gallbladder wall thickening, or biliary dilatation. Pancreas: Unremarkable. No pancreatic ductal dilatation or surrounding inflammatory changes. Spleen: Normal in size without focal abnormality. Adrenals/Urinary Tract: Adrenal glands are unremarkable. Kidneys are normal, without renal calculi, focal lesion, or hydronephrosis. Bladder is unremarkable. Stomach/Bowel: Stomach is within normal limits. Appendix is  surgically absent. Dilated, fluid-filled loops of small bowel with diffuse localization are identified in the mid abdomen and right hemipelvis. This is just proximal to the area of the small bowel anastomosis in the right pelvis. There is focal dilatation up to 4.6 cm with fecalization of the contents at the anastomosis (PACs axial slice 66 of 88). The small and large bowel distal to the anastomosis tapers to a normal caliber. Vascular/Lymphatic: No significant vascular findings are  present. No enlarged abdominal or pelvic lymph nodes. Reproductive: Status post hysterectomy. No adnexal masses. Other: Status post hernia mesh repair. No abdominopelvic ascites. Musculoskeletal: Stable degenerative changes, most prominent at L5-S1 with grade 1 anterolisthesis. No acute or significant osseous findings. IMPRESSION: 1. Findings concerning for early small bowel obstruction at the mid abdomen and right hemipelvis just proximal to the small bowel anastomosis. There is focal dilatation up to 4.6 cm and fecalization at the level of the anastomosis which tapers distally. 2. Other stable and chronic findings as detailed. Electronically Signed   By: Kristopher Oppenheim M.D.   On: 10/01/2016 08:30   Dg Abd Portable 1 View  Result Date: 10/01/2016 CLINICAL DATA:  Nasogastric tube placement. EXAM: PORTABLE ABDOMEN - 1 VIEW COMPARISON:  Abdomen and pelvis CT dated 10/01/2016. FINDINGS: Nasogastric tube the tip at the gastroesophageal junction and side hole in the distal esophagus. Paucity of intestinal gas with no dilated bowel loops or free peritoneal air seen. Excreted contrast in the renal collecting systems. Enlarged heart and prominent interstitial lung markings. Mild levoconvex lumbar rotary scoliosis and mild thoracic spine degenerative changes. IMPRESSION: 1. Nasogastric tube tip at the gastroesophageal junction and side hole in the distal esophagus. Recommend this be advanced 14 cm. 2. Cardiomegaly and chronic interstitial  lung disease with possible mild interstitial pulmonary edema. Electronically Signed   By: Claudie Revering M.D.   On: 10/01/2016 10:26    Pending Labs [employer]    Start     Ordered   10/01/16 0557  Urinalysis, Routine w reflex microscopic  STAT,   STAT     10/01/16 0556   Signed and Held  HIV antibody (Routine Testing)  Once,   R     Signed and Held   Signed and Held  CBC  (enoxaparin (LOVENOX)    CrCl >/= 30 ml/min)  Once,   R    Comments:  Baseline for enoxaparin therapy IF NOT ALREADY DRAWN.  Notify MD if PLT < 100 K.    Signed and Held   Signed and Held  Creatinine, serum  (enoxaparin (LOVENOX)    CrCl >/= 30 ml/min)  Once,   R    Comments:  Baseline for enoxaparin therapy IF NOT ALREADY DRAWN.    Signed and Held   Signed and Held  Creatinine, serum  (enoxaparin (LOVENOX)    CrCl >/= 30 ml/min)  Weekly,   R    Comments:  while on enoxaparin therapy    Signed and Held   Signed and Held  CBC  Tomorrow morning,   R     Signed and Held   Signed and Held  Basic metabolic panel  Tomorrow morning,   R     Signed and Held      Vitals/Pain Today's Vitals   10/01/16 0554 10/01/16 0601 10/01/16 0908 10/01/16 1000  BP:      Pulse:      Resp:      Temp:      TempSrc:      SpO2:      Weight: 225 lb (102.1 kg)     Height: _0  (1.651 m)     PainSc:  10-Worst pain ever 0-No pain 8     Isolation Precautions No active isolations  Medications Medications  morphine 2 MG/ML injection 1-2 mg (2 mg Intravenous Given 10/01/16 1003)  diphenhydrAMINE (BENADRYL) injection 12.5 mg (12.5 mg Intravenous Given 10/01/16 1032)  morphine 4 MG/ML injection 4 mg (4 mg Intravenous  Given 10/01/16 0616)  ondansetron (ZOFRAN) injection 4 mg (4 mg Intravenous Given 10/01/16 0616)  iopamidol (ISOVUE-300) 61 % injection 100 mL (100 mLs Intravenous Contrast Given 10/01/16 0743)  potassium chloride 10 mEq in 100 mL IVPB (0 mEq Intravenous Stopped 10/01/16 0952)    Mobility walks

## 2016-10-02 ENCOUNTER — Observation Stay (HOSPITAL_COMMUNITY): Payer: BLUE CROSS/BLUE SHIELD

## 2016-10-02 DIAGNOSIS — E1169 Type 2 diabetes mellitus with other specified complication: Secondary | ICD-10-CM

## 2016-10-02 DIAGNOSIS — E039 Hypothyroidism, unspecified: Secondary | ICD-10-CM | POA: Diagnosis present

## 2016-10-02 DIAGNOSIS — E669 Obesity, unspecified: Secondary | ICD-10-CM | POA: Diagnosis present

## 2016-10-02 DIAGNOSIS — Z886 Allergy status to analgesic agent status: Secondary | ICD-10-CM | POA: Diagnosis not present

## 2016-10-02 DIAGNOSIS — Z8673 Personal history of transient ischemic attack (TIA), and cerebral infarction without residual deficits: Secondary | ICD-10-CM | POA: Diagnosis not present

## 2016-10-02 DIAGNOSIS — I10 Essential (primary) hypertension: Secondary | ICD-10-CM | POA: Diagnosis present

## 2016-10-02 DIAGNOSIS — Z880 Allergy status to penicillin: Secondary | ICD-10-CM | POA: Diagnosis not present

## 2016-10-02 DIAGNOSIS — E876 Hypokalemia: Secondary | ICD-10-CM | POA: Diagnosis present

## 2016-10-02 DIAGNOSIS — Z87891 Personal history of nicotine dependence: Secondary | ICD-10-CM | POA: Diagnosis not present

## 2016-10-02 DIAGNOSIS — K56609 Unspecified intestinal obstruction, unspecified as to partial versus complete obstruction: Principal | ICD-10-CM

## 2016-10-02 DIAGNOSIS — Z90710 Acquired absence of both cervix and uterus: Secondary | ICD-10-CM | POA: Diagnosis not present

## 2016-10-02 DIAGNOSIS — I252 Old myocardial infarction: Secondary | ICD-10-CM | POA: Diagnosis not present

## 2016-10-02 DIAGNOSIS — E1151 Type 2 diabetes mellitus with diabetic peripheral angiopathy without gangrene: Secondary | ICD-10-CM | POA: Diagnosis present

## 2016-10-02 DIAGNOSIS — Z794 Long term (current) use of insulin: Secondary | ICD-10-CM | POA: Diagnosis not present

## 2016-10-02 DIAGNOSIS — Z79899 Other long term (current) drug therapy: Secondary | ICD-10-CM | POA: Diagnosis not present

## 2016-10-02 DIAGNOSIS — E1142 Type 2 diabetes mellitus with diabetic polyneuropathy: Secondary | ICD-10-CM | POA: Diagnosis present

## 2016-10-02 DIAGNOSIS — F419 Anxiety disorder, unspecified: Secondary | ICD-10-CM | POA: Diagnosis present

## 2016-10-02 DIAGNOSIS — Z888 Allergy status to other drugs, medicaments and biological substances status: Secondary | ICD-10-CM | POA: Diagnosis not present

## 2016-10-02 DIAGNOSIS — E785 Hyperlipidemia, unspecified: Secondary | ICD-10-CM | POA: Diagnosis present

## 2016-10-02 DIAGNOSIS — Z6837 Body mass index (BMI) 37.0-37.9, adult: Secondary | ICD-10-CM | POA: Diagnosis not present

## 2016-10-02 DIAGNOSIS — I2510 Atherosclerotic heart disease of native coronary artery without angina pectoris: Secondary | ICD-10-CM | POA: Diagnosis present

## 2016-10-02 DIAGNOSIS — R112 Nausea with vomiting, unspecified: Secondary | ICD-10-CM | POA: Diagnosis present

## 2016-10-02 DIAGNOSIS — Z7982 Long term (current) use of aspirin: Secondary | ICD-10-CM | POA: Diagnosis not present

## 2016-10-02 DIAGNOSIS — Z882 Allergy status to sulfonamides status: Secondary | ICD-10-CM | POA: Diagnosis not present

## 2016-10-02 LAB — HIV ANTIBODY (ROUTINE TESTING W REFLEX): HIV SCREEN 4TH GENERATION: NONREACTIVE

## 2016-10-02 LAB — GLUCOSE, CAPILLARY
GLUCOSE-CAPILLARY: 129 mg/dL — AB (ref 65–99)
GLUCOSE-CAPILLARY: 132 mg/dL — AB (ref 65–99)
GLUCOSE-CAPILLARY: 143 mg/dL — AB (ref 65–99)
GLUCOSE-CAPILLARY: 144 mg/dL — AB (ref 65–99)
GLUCOSE-CAPILLARY: 148 mg/dL — AB (ref 65–99)
Glucose-Capillary: 129 mg/dL — ABNORMAL HIGH (ref 65–99)
Glucose-Capillary: 128 mg/dL — ABNORMAL HIGH (ref 65–99)

## 2016-10-02 LAB — BASIC METABOLIC PANEL
Anion gap: 8 (ref 5–15)
BUN: 15 mg/dL (ref 6–20)
CALCIUM: 8.7 mg/dL — AB (ref 8.9–10.3)
CO2: 23 mmol/L (ref 22–32)
CREATININE: 1 mg/dL (ref 0.44–1.00)
Chloride: 103 mmol/L (ref 101–111)
GFR calc non Af Amer: 59 mL/min — ABNORMAL LOW (ref >60)
Glucose, Bld: 126 mg/dL — ABNORMAL HIGH (ref 65–99)
Potassium: 3.5 mmol/L (ref 3.5–5.1)
SODIUM: 134 mmol/L — AB (ref 135–145)

## 2016-10-02 LAB — CBC
HCT: 42.9 % (ref 36.0–46.0)
Hemoglobin: 14.5 g/dL (ref 12.0–15.0)
MCH: 27.8 pg (ref 26.0–34.0)
MCHC: 33.8 g/dL (ref 30.0–36.0)
MCV: 82.3 fL (ref 78.0–100.0)
PLATELETS: 208 10*3/uL (ref 150–400)
RBC: 5.21 MIL/uL — ABNORMAL HIGH (ref 3.87–5.11)
RDW: 13.5 % (ref 11.5–15.5)
WBC: 4.3 10*3/uL (ref 4.0–10.5)

## 2016-10-02 MED ORDER — POTASSIUM CHLORIDE IN NACL 20-0.9 MEQ/L-% IV SOLN
INTRAVENOUS | Status: DC
Start: 2016-10-02 — End: 2016-10-04
  Administered 2016-10-02 – 2016-10-04 (×3): via INTRAVENOUS
  Filled 2016-10-02 (×5): qty 1000

## 2016-10-02 MED ORDER — HYDROMORPHONE HCL-NACL 0.5-0.9 MG/ML-% IV SOSY
1.0000 mg | PREFILLED_SYRINGE | INTRAVENOUS | Status: DC | PRN
  Administered 2016-10-02 – 2016-10-05 (×19): 1 mg via INTRAVENOUS
  Filled 2016-10-02 (×19): qty 2

## 2016-10-02 NOTE — Progress Notes (Addendum)
[facility] PROGRESS NOTE  [name] GQQ:[id number] DOB: Oct 25, 1955 DOA: 10/01/2016  PCP: Amanda Palmer, MD  Brief History/Interval Summary: [name] with a history of previous episodes of small bowel obstruction, history of hysterectomy, ventral hernia repair, hypertension, diabetes, who presented with nausea, vomiting, abdominal pain. She was found to have small bowel obstruction. She was hospitalized for further management.  Reason for Visit: Small bowel obstruction  Consultants: Gen. surgery  Procedures: None  Antibiotics: None  Subjective/Interval History: Patient feels better this morning. Continues to have abdominal discomfort. Not passing any gas yet. Denies any nausea, vomiting since she has been in the hospital.  ROS: Denies any chest pain, shortness of breath.  Objective:  Vital Signs  Vitals:   10/01/16 1120 10/01/16 1430 10/01/16 2124 10/02/16 0510  BP: 124/64 124/79 (!) 123/59 (!) 154/80  Pulse: (!) 50 (!) 54 (!) 52 64  Resp: 18 18 16 18   Temp: 97.7 F (36.5 C) 97.9 F (36.6 C) 97.6 F (36.4 C) 98 F (36.7 C)  TempSrc: Oral Oral Oral Oral  SpO2: 95% 96% 95% 98%  Weight:      Height:        Intake/Output Summary (Last 24 hours) at 10/02/16 1029 Last data filed at 10/02/16 0500  Gross per 24 hour  Intake          1673.33 ml  Output              101 ml  Net          1572.33 ml   Filed Weights   10/01/16 0554  Weight: 102.1 kg (225 lb)    General appearance: alert, cooperative, appears stated age and no distress Head: Normocephalic, without obvious abnormality, atraumatic Resp: clear to auscultation bilaterally Cardio: regular rate and rhythm, S1, S2 normal, no murmur, click, rub or gallop GI: Abdomen is soft. There is tenderness in the central part without any rebound, rigidity or guarding. No masses or organomegaly. I don't appreciate any bowel sounds at this time. Extremities: extremities normal,  atraumatic, no cyanosis or edema Neurologic: No focal neurological deficits.  Lab Results:  Data Reviewed: I have personally reviewed following labs and imaging studies  CBC:  Recent Labs Lab 10/01/16 0557 10/02/16 0331  WBC 4.5 4.3  HGB 15.5* 14.5  HCT 44.3 42.9  MCV 81.1 82.3  PLT 216 208    Basic Metabolic Panel:  Recent Labs Lab 10/01/16 0557 10/01/16 1519 10/02/16 0331  NA 133*  --  134*  K 2.9*  --  3.5  CL 98*  --  103  CO2 24  --  23  GLUCOSE 216*  --  126*  BUN 16  --  15  CREATININE 0.93 0.92 1.00  CALCIUM 9.5  --  8.7*  MG 1.7  --   --     GFR: Estimated Creatinine Clearance: 69.1 mL/min (by C-G formula based on SCr of 1 mg/dL).  Liver Function Tests:  Recent Labs Lab 10/01/16 0557  AST 39  ALT 38  ALKPHOS 79  BILITOT 1.1  PROT 8.1  ALBUMIN 4.0     Recent Labs Lab 10/01/16 0557  LIPASE 25   CBG:  Recent Labs Lab 10/01/16 1638 10/01/16 2014 10/02/16 0101 10/02/16 0358 10/02/16 0745  GLUCAP 110* 98 148* 129* 129*     Radiology Studies: Ct Abdomen Pelvis W Contrast  Result Date: 10/01/2016 CLINICAL DATA:  61 year old female with vomiting and diffuse abdominal pain for several hours. History of  bowel obstruction. EXAM: CT ABDOMEN AND PELVIS WITH CONTRAST TECHNIQUE: Multidetector CT imaging of the abdomen and pelvis was performed using the standard protocol following bolus administration of intravenous contrast. CONTRAST:  100 mL Isovue-300 COMPARISON:  05/27/2013, 02/08/2012 FINDINGS: Lower chest: No acute abnormality. Hepatobiliary: No focal liver abnormality is seen. No gallstones, gallbladder wall thickening, or biliary dilatation. Pancreas: Unremarkable. No pancreatic ductal dilatation or surrounding inflammatory changes. Spleen: Normal in size without focal abnormality. Adrenals/Urinary Tract: Adrenal glands are unremarkable. Kidneys are normal, without renal calculi, focal lesion, or hydronephrosis. Bladder is unremarkable.  Stomach/Bowel: Stomach is within normal limits. Appendix is surgically absent. Dilated, fluid-filled loops of small bowel with diffuse localization are identified in the mid abdomen and right hemipelvis. This is just proximal to the area of the small bowel anastomosis in the right pelvis. There is focal dilatation up to 4.6 cm with fecalization of the contents at the anastomosis (PACs axial slice 66 of 88). The small and large bowel distal to the anastomosis tapers to a normal caliber. Vascular/Lymphatic: No significant vascular findings are present. No enlarged abdominal or pelvic lymph nodes. Reproductive: Status post hysterectomy. No adnexal masses. Other: Status post hernia mesh repair. No abdominopelvic ascites. Musculoskeletal: Stable degenerative changes, most prominent at L5-S1 with grade 1 anterolisthesis. No acute or significant osseous findings. IMPRESSION: 1. Findings concerning for early small bowel obstruction at the mid abdomen and right hemipelvis just proximal to the small bowel anastomosis. There is focal dilatation up to 4.6 cm and fecalization at the level of the anastomosis which tapers distally. 2. Other stable and chronic findings as detailed. Electronically Signed   By: Sande Brothers M.D.   On: 10/01/2016 08:30   Dg Abd Portable 1v  Result Date: 10/02/2016 CLINICAL DATA:  Small bowel obstruction, follow-up study. History of diabetes, peripheral vascular disease. EXAM: PORTABLE ABDOMEN - 1 VIEW COMPARISON:  Lower chest and upper abdominal radiograph of October 01, 2016 FINDINGS: The colonic stool burden is moderate. There is no small or large bowel obstructive pattern. There is stool and gas in the rectum. There numerous coils present over the pelvis from previous hernia repair. The esophagogastric tube tip and proximal port project below the GE junction. There are degenerative changes of the lower lumbar spine. IMPRESSION: No evidence of small bowel obstruction. There is a moderate amount  of stool within the colon and rectum. Electronically Signed   By: David  [place] M.D.   On: 10/02/2016 07:15   Dg Abd Portable 1 View  Result Date: 10/01/2016 CLINICAL DATA:  Nasogastric tube placement. EXAM: PORTABLE ABDOMEN - 1 VIEW COMPARISON:  Abdomen and pelvis CT dated 10/01/2016. FINDINGS: Nasogastric tube the tip at the gastroesophageal junction and side hole in the distal esophagus. Paucity of intestinal gas with no dilated bowel loops or free peritoneal air seen. Excreted contrast in the renal collecting systems. Enlarged heart and prominent interstitial lung markings. Mild levoconvex lumbar rotary scoliosis and mild thoracic spine degenerative changes. IMPRESSION: 1. Nasogastric tube tip at the gastroesophageal junction and side hole in the distal esophagus. Recommend this be advanced 14 cm. 2. Cardiomegaly and chronic interstitial lung disease with possible mild interstitial pulmonary edema. Electronically Signed   By: Beckie Salts M.D.   On: 10/01/2016 10:26     Medications:  Scheduled: . enoxaparin (LOVENOX) injection  40 mg Subcutaneous Q24H  . insulin aspart  0-9 Units Subcutaneous Q4H  . ketorolac  15 mg Intravenous Q6H  . levothyroxine  50 mcg Intravenous Daily  . pantoprazole (  PROTONIX) IV  40 mg Intravenous Q24H   Continuous: . sodium chloride 100 mL/hr at 10/02/16 0807   ZOX:[id number], hydrALAZINE, labetalol, menthol-cetylpyridinium, morphine injection, ondansetron (ZOFRAN) IV  Assessment/Plan:  Principal Problem:   SBO (small bowel obstruction) (HCC)    Small bowel obstruction. NG tube was placed. Patient has not passed any gas yet. General surgery is following and managing. Pain control.  Diabetes mellitus type 2 in obese patient. Continue sliding scale insulin coverage.  Essential hypertension. Monitor blood pressures closely. Hydralazine as needed for elevated blood pressure.  Hypokalemia Potassium level better today. Repeat labs  tomorrow.  Hypothyroidism Synthroid being given intravenously while she is nothing by mouth.  History of hyperlipidemia. Holding her Crestor for now.  Class II Obesity Body mass index is 37.44 kg/m.   DVT Prophylaxis: Lovenox    Code Status: Full code  Family Communication: Discussed with the patient  Disposition Plan: Management as outlined above. Mobilize as much as possible.    LOS: 0 days   [facility]  [facility] Pager [phone] 10/02/2016, 10:29 AM  If 7PM-7AM, please contact night-coverage at [email or web address], password [facility]

## 2016-10-02 NOTE — Progress Notes (Signed)
Assessment Principal Problem:   SBO (small bowel obstruction) (HCC)-Morning x-ray demonstrates no dilated small bowel; clinically about the same; SB protocol started   Plan:  Continue ng.  Monitor series of x-rays.   LOS: 0 days        Chief Complaint/Subjective: No flatus or BM.  Still with the same pain.  Objective: Vital signs in last 24 hours: Temp:  [97.6 F (36.4 C)-98 F (36.7 C)] 98 F (36.7 C) (08/28 0510) Pulse Rate:  [50-64] 64 (08/28 0510) Resp:  [16-18] 18 (08/28 0510) BP: (123-154)/(59-80) 154/80 (08/28 0510) SpO2:  [95 %-98 %] 98 % (08/28 0510) Last BM Date: 09/30/16  Intake/Output from previous day: 08/27 0701 - 08/28 0700 In: 1673.3 [I.V.:1673.3] Out: 101 [Urine:1; Emesis/NG output:100] Intake/Output this shift: No intake/output data recorded.  PE: General- In NAD.  Awake and alert. Abdomen-soft, no significant tenderness, hypoactive bowel sounds  Lab Results:   Recent Labs  10/01/16 0557 10/02/16 0331  WBC 4.5 4.3  HGB 15.5* 14.5  HCT 44.3 42.9  PLT 216 208   BMET  Recent Labs  10/01/16 0557 10/01/16 1519 10/02/16 0331  NA 133*  --  134*  K 2.9*  --  3.5  CL 98*  --  103  CO2 24  --  23  GLUCOSE 216*  --  126*  BUN 16  --  15  CREATININE 0.93 0.92 1.00  CALCIUM 9.5  --  8.7*   PT/INR No results for input(s): LABPROT, INR in the last 72 hours. Comprehensive Metabolic Panel:    Component Value Date/Time   NA 134 (L) 10/02/2016 0331   NA 133 (L) 10/01/2016 0557   K 3.5 10/02/2016 0331   K 2.9 (L) 10/01/2016 0557   CL 103 10/02/2016 0331   CL 98 (L) 10/01/2016 0557   CO2 23 10/02/2016 0331   CO2 24 10/01/2016 0557   BUN 15 10/02/2016 0331   BUN 16 10/01/2016 0557   CREATININE 1.00 10/02/2016 0331   CREATININE 0.92 10/01/2016 1519   GLUCOSE 126 (H) 10/02/2016 0331   GLUCOSE 216 (H) 10/01/2016 0557   CALCIUM 8.7 (L) 10/02/2016 0331   CALCIUM 9.5 10/01/2016 0557   AST 39 10/01/2016 0557   AST 60 (H) 04/09/2014 1537   ALT 38 10/01/2016 0557   ALT 53 (H) 04/09/2014 1537   ALKPHOS 79 10/01/2016 0557   ALKPHOS 135 (H) 04/09/2014 1537   BILITOT 1.1 10/01/2016 0557   BILITOT 0.8 04/09/2014 1537   PROT 8.1 10/01/2016 0557   PROT 7.6 04/09/2014 1537   ALBUMIN 4.0 10/01/2016 0557   ALBUMIN 4.0 04/09/2014 1537     Studies/Results: Ct Abdomen Pelvis W Contrast  Result Date: 10/01/2016 CLINICAL DATA:  61 year old female with vomiting and diffuse abdominal pain for several hours. History of bowel obstruction. EXAM: CT ABDOMEN AND PELVIS WITH CONTRAST TECHNIQUE: Multidetector CT imaging of the abdomen and pelvis was performed using the standard protocol following bolus administration of intravenous contrast. CONTRAST:  100 mL Isovue-300 COMPARISON:  05/27/2013, 02/08/2012 FINDINGS: Lower chest: No acute abnormality. Hepatobiliary: No focal liver abnormality is seen. No gallstones, gallbladder wall thickening, or biliary dilatation. Pancreas: Unremarkable. No pancreatic ductal dilatation or surrounding inflammatory changes. Spleen: Normal in size without focal abnormality. Adrenals/Urinary Tract: Adrenal glands are unremarkable. Kidneys are normal, without renal calculi, focal lesion, or hydronephrosis. Bladder is unremarkable. Stomach/Bowel: Stomach is within normal limits. Appendix is surgically absent. Dilated, fluid-filled loops of small bowel with diffuse localization are identified in the mid abdomen  and right hemipelvis. This is just proximal to the area of the small bowel anastomosis in the right pelvis. There is focal dilatation up to 4.6 cm with fecalization of the contents at the anastomosis (PACs axial slice 66 of 88). The small and large bowel distal to the anastomosis tapers to a normal caliber. Vascular/Lymphatic: No significant vascular findings are present. No enlarged abdominal or pelvic lymph nodes. Reproductive: Status post hysterectomy. No adnexal masses. Other: Status post hernia mesh repair. No  abdominopelvic ascites. Musculoskeletal: Stable degenerative changes, most prominent at L5-S1 with grade 1 anterolisthesis. No acute or significant osseous findings. IMPRESSION: 1. Findings concerning for early small bowel obstruction at the mid abdomen and right hemipelvis just proximal to the small bowel anastomosis. There is focal dilatation up to 4.6 cm and fecalization at the level of the anastomosis which tapers distally. 2. Other stable and chronic findings as detailed. Electronically Signed   By: Sande Brothers M.D.   On: 10/01/2016 08:30   Dg Abd Portable 1v  Result Date: 10/02/2016 CLINICAL DATA:  Small bowel obstruction, follow-up study. History of diabetes, peripheral vascular disease. EXAM: PORTABLE ABDOMEN - 1 VIEW COMPARISON:  Lower chest and upper abdominal radiograph of October 01, 2016 FINDINGS: The colonic stool burden is moderate. There is no small or large bowel obstructive pattern. There is stool and gas in the rectum. There numerous coils present over the pelvis from previous hernia repair. The esophagogastric tube tip and proximal port project below the GE junction. There are degenerative changes of the lower lumbar spine. IMPRESSION: No evidence of small bowel obstruction. There is a moderate amount of stool within the colon and rectum. Electronically Signed   By: David  [place] M.D.   On: 10/02/2016 07:15   Dg Abd Portable 1 View  Result Date: 10/01/2016 CLINICAL DATA:  Nasogastric tube placement. EXAM: PORTABLE ABDOMEN - 1 VIEW COMPARISON:  Abdomen and pelvis CT dated 10/01/2016. FINDINGS: Nasogastric tube the tip at the gastroesophageal junction and side hole in the distal esophagus. Paucity of intestinal gas with no dilated bowel loops or free peritoneal air seen. Excreted contrast in the renal collecting systems. Enlarged heart and prominent interstitial lung markings. Mild levoconvex lumbar rotary scoliosis and mild thoracic spine degenerative changes. IMPRESSION: 1. Nasogastric  tube tip at the gastroesophageal junction and side hole in the distal esophagus. Recommend this be advanced 14 cm. 2. Cardiomegaly and chronic interstitial lung disease with possible mild interstitial pulmonary edema. Electronically Signed   By: Beckie Salts M.D.   On: 10/01/2016 10:26    Anti-infectives: Anti-infectives    None       Lenix Kidd J 10/02/2016

## 2016-10-03 LAB — GLUCOSE, CAPILLARY
GLUCOSE-CAPILLARY: 129 mg/dL — AB (ref 65–99)
GLUCOSE-CAPILLARY: 119 mg/dL — AB (ref 65–99)
GLUCOSE-CAPILLARY: 133 mg/dL — AB (ref 65–99)
GLUCOSE-CAPILLARY: 107 mg/dL — AB (ref 65–99)
Glucose-Capillary: 123 mg/dL — ABNORMAL HIGH (ref 65–99)
Glucose-Capillary: 124 mg/dL — ABNORMAL HIGH (ref 65–99)

## 2016-10-03 LAB — BASIC METABOLIC PANEL
Anion gap: 10 (ref 5–15)
BUN: 11 mg/dL (ref 6–20)
CALCIUM: 8.6 mg/dL — AB (ref 8.9–10.3)
CO2: 22 mmol/L (ref 22–32)
CREATININE: 0.95 mg/dL (ref 0.44–1.00)
Chloride: 104 mmol/L (ref 101–111)
Glucose, Bld: 135 mg/dL — ABNORMAL HIGH (ref 65–99)
Potassium: 3.4 mmol/L — ABNORMAL LOW (ref 3.5–5.1)
SODIUM: 136 mmol/L (ref 135–145)

## 2016-10-03 LAB — MAGNESIUM: MAGNESIUM: 1.6 mg/dL — AB (ref 1.7–2.4)

## 2016-10-03 MED ORDER — METHOCARBAMOL 1000 MG/10ML IJ SOLN
500.0000 mg | Freq: Three times a day (TID) | INTRAMUSCULAR | Status: DC | PRN
  Filled 2016-10-03: qty 5

## 2016-10-03 MED ORDER — POTASSIUM CHLORIDE 10 MEQ/100ML IV SOLN
10.0000 meq | INTRAVENOUS | Status: AC
  Administered 2016-10-03 (×2): 10 meq via INTRAVENOUS
  Filled 2016-10-03 (×2): qty 100

## 2016-10-03 NOTE — Progress Notes (Signed)
Patient ID: [name], female   DOB: 01/16/1956, 61 y.o.   MRN: [id number]  [facility]     Subjective: CC- SBO Patient sitting up in bed. States that she continues to have intermittent crampy abdominal pain, but it is improved since admission. Denies n/v. She started passing some flatus this morning. No BM. Ambulating around room but not in hall.  Objective: Vital signs in last 24 hours: Temp:  [98 F (36.7 C)-98.3 F (36.8 C)] 98 F (36.7 C) (08/29 0428) Pulse Rate:  [62-70] 70 (08/29 0428) Resp:  [18] 18 (08/29 0428) BP: (129-146)/(57-81) 129/57 (08/29 0428) SpO2:  [95 %-98 %] 97 % (08/29 0428) Last BM Date: 09/30/16  Intake/Output from previous day: 08/28 0701 - 08/29 0700 In: 467.5 [I.V.:467.5] Out: 400 [Emesis/NG output:400] Intake/Output this shift: No intake/output data recorded.  PE: Gen:  Alert, NAD, pleasant HEENT: EOM's intact, pupils equal and round Card:  RRR, no M/G/R heard Pulm:  CTAB, no W/R/R, effort normal Abd: obese, soft, mild distension, few BS heard, no HSM, no hernia, globally tender/most severe in the LLQ Ext:  No erythema, edema, or tenderness BUE/BLE  Psych: A&Ox3  Skin: no rashes noted, warm and dry  Lab Results:   Recent Labs  10/01/16 0557 10/02/16 0331  WBC 4.5 4.3  HGB 15.5* 14.5  HCT 44.3 42.9  PLT 216 208   BMET  Recent Labs  10/02/16 0331 10/03/16 0333  NA 134* 136  K 3.5 3.4*  CL 103 104  CO2 23 22  GLUCOSE 126* 135*  BUN 15 11  CREATININE 1.00 0.95  CALCIUM 8.7* 8.6*   PT/INR No results for input(s): LABPROT, INR in the last 72 hours. CMP     Component Value Date/Time   NA 136 10/03/2016 0333   K 3.4 (L) 10/03/2016 0333   CL 104 10/03/2016 0333   CO2 22 10/03/2016 0333   GLUCOSE 135 (H) 10/03/2016 0333   BUN 11 10/03/2016 0333   CREATININE 0.95 10/03/2016 0333   CALCIUM 8.6 (L) 10/03/2016 0333   PROT 8.1 10/01/2016 0557   ALBUMIN 4.0 10/01/2016 0557   AST 39 10/01/2016  0557   ALT 38 10/01/2016 0557   ALKPHOS 79 10/01/2016 0557   BILITOT 1.1 10/01/2016 0557   GFRNONAA >60 10/03/2016 0333   GFRAA >60 10/03/2016 0333   Lipase     Component Value Date/Time   LIPASE 25 10/01/2016 0557       Studies/Results: Dg Abd Portable 1v  Result Date: 10/02/2016 CLINICAL DATA:  Small bowel obstruction EXAM: PORTABLE ABDOMEN - 1 VIEW COMPARISON:  Earlier today FINDINGS: Nasogastric tube tip and side-port over the stomach. Bowel gas pattern is normal. Some high density near the left diaphragm could be within the fundus of the stomach. Previous hernia repair using mesh. No concerning mass effect or calcification. IMPRESSION: 1. Normal bowel gas pattern. 2. If enteric contrast was administered it has diluted and is not seen in small bowel or colon. 3. Nasogastric tube in good position. Electronically Signed   By: Marnee SpringJonathon  Watts M.D.   On: 10/02/2016 14:56   Dg Abd Portable 1v  Result Date: 10/02/2016 CLINICAL DATA:  Small bowel obstruction, follow-up study. History of diabetes, peripheral vascular disease. EXAM: PORTABLE ABDOMEN - 1 VIEW COMPARISON:  Lower chest and upper abdominal radiograph of October 01, 2016 FINDINGS: The colonic stool burden is moderate. There is no small or large bowel obstructive pattern. There is stool and gas in the rectum.  There numerous coils present over the pelvis from previous hernia repair. The esophagogastric tube tip and proximal port project below the GE junction. There are degenerative changes of the lower lumbar spine. IMPRESSION: No evidence of small bowel obstruction. There is a moderate amount of stool within the colon and rectum. Electronically Signed   By: David  [place] M.D.   On: 10/02/2016 07:15    Anti-infectives: Anti-infectives    None       Assessment/Plan HTN DM HLD Obesity  SBO - history of multiple abdominal surgeries - CT 8/27 concerning for early small bowel obstruction at the mid abdomen and right hemipelvis  just proximal to the small bowel anastomosis - XR yesterday showed normal bowel gast pattern, no contrast seen - NG with 400cc output recorded over last 24 hours - +flatus, no BM  ID - none FEN - IVF, NPO/NGT VTE - SCDs, lovenox Foley - none Follow up - TBD  Plan - continue NG tube to LIWS. Encouraging that she has started to pass flatus. Encouraged more ambulation today.   LOS: 1 day    Franne Forts , [facility] 10/03/2016, 12:02 PM Pager: [phone] Consults: [phone] Mon-Fri 7:00 am-4:30 pm Sat-Sun 7:00 am-11:30 am

## 2016-10-03 NOTE — Progress Notes (Signed)
Patient ambulated in hallway today, and claimed she had 2 bowel movements.

## 2016-10-03 NOTE — Progress Notes (Signed)
PROGRESS NOTE    Amanda SaltsMary A Liska  ZOX:[id number] DOB: 02/11/1955 DOA: 10/01/2016 PCP: Mila PalmerWolters, Sharon, MD     Brief Narrative:  [name] is a 61 y.o. female with medical history significant of previous episodes of SBO, hx of hysterectomy, previous SBO requiring surgical intervention by Dr. Michaell CowingGross in 2009, ventral hernia repair, with most recent SBO episode in May 2018 [facility]. She presents with nausea, vomiting 3 and abdominal pain starting at 3 AM this morning. CT abd/pelvis with early small bowel obstruction at mid abdomen and right hemipelvis just proximal to small bowel anastomosis. NGT placed. General surgery consulted.   Assessment & Plan:   Principal Problem:   SBO (small bowel obstruction) (HCC)  SBO -CT abd/pelvis showed early small bowel obstruction at mid abdomen and right hemipelvis just proximal to small bowel anastomosis  -Abdominal series showed normal bowel gas pattern, not seen in small bowel or colon  -NGT output 400mL so far  -NPO -IVF -CCS following  -Pain control and antiemetic prn   Hypokalemia -Replace, trend  HTN -Hold oral meds while NPO. Hydralazine and labetalol prn   DM type 2 -SSI while NPO   Hypothyroidism -Synthroid IV while NPO   HLD -Hold crestor while NPO   Class II Obesity -Body mass index is 37.44 kg/m.  DVT prophylaxis: lovenox Code Status: full Family Communication: no family at bedside Disposition Plan: pending improvement   Consultants:   CCS  Procedures:   None  Antimicrobials:  Anti-infectives    None       Subjective: Abdominal pain is still present, mildly improved. Had some nausea without vomiting. She states that the abdominal pain and nausea worsened with contrast. Is passing gas, no stool yet.  Objective: Vitals:   10/02/16 0510 10/02/16 1400 10/02/16 1957 10/03/16 0428  BP: (!) 154/80 (!) 146/81 133/61 (!) 129/57  Pulse: 64 62 62 70  Resp: 18 18 18 18   Temp: 98 F  (36.7 C) 98.3 F (36.8 C) 98.2 F (36.8 C) 98 F (36.7 C)  TempSrc: Oral Oral Oral Oral  SpO2: 98% 98% 95% 97%  Weight:      Height:        Intake/Output Summary (Last 24 hours) at 10/03/16 1310 Last data filed at 10/03/16 0700  Gross per 24 hour  Intake            467.5 ml  Output              400 ml  Net             67.5 ml   Filed Weights   10/01/16 0554  Weight: 102.1 kg (225 lb)    Examination:  General exam: Appears calm and comfortable  Respiratory system: Clear to auscultation. Respiratory effort normal. Cardiovascular system: S1 & S2 heard, RRR. No JVD, murmurs, rubs, gallops or clicks. No pedal edema. Gastrointestinal system: Abdomen is nondistended, soft and TTP mid-abdomen. No organomegaly or masses felt. Few bowel sounds heard. Central nervous system: Alert and oriented. No focal neurological deficits. Extremities: Symmetric 5 x 5 power. Skin: No rashes, lesions or ulcers Psychiatry: Judgement and insight appear normal. Mood & affect appropriate.   Data Reviewed: I have personally reviewed following labs and imaging studies  CBC:  Recent Labs Lab 10/01/16 0557 10/02/16 0331  WBC 4.5 4.3  HGB 15.5* 14.5  HCT 44.3 42.9  MCV 81.1 82.3  PLT 216 208   Basic Metabolic Panel:  Recent Labs Lab 10/01/16  1610 10/01/16 1519 10/02/16 0331 10/03/16 0333  NA 133*  --  134* 136  K 2.9*  --  3.5 3.4*  CL 98*  --  103 104  CO2 24  --  23 22  GLUCOSE 216*  --  126* 135*  BUN 16  --  15 11  CREATININE 0.93 0.92 1.00 0.95  CALCIUM 9.5  --  8.7* 8.6*  MG 1.7  --   --  1.6*   GFR: Estimated Creatinine Clearance: 72.7 mL/min (by C-G formula based on SCr of 0.95 mg/dL). Liver Function Tests:  Recent Labs Lab 10/01/16 0557  AST 39  ALT 38  ALKPHOS 79  BILITOT 1.1  PROT 8.1  ALBUMIN 4.0    Recent Labs Lab 10/01/16 0557  LIPASE 25   No results for input(s): AMMONIA in the last 168 hours. Coagulation Profile: No results for input(s): INR,  PROTIME in the last 168 hours. Cardiac Enzymes: No results for input(s): CKTOTAL, CKMB, CKMBINDEX, TROPONINI in the last 168 hours. BNP (last 3 results) No results for input(s): PROBNP in the last 8760 hours. HbA1C: No results for input(s): HGBA1C in the last 72 hours. CBG:  Recent Labs Lab 10/02/16 1955 10/02/16 2341 10/03/16 0433 10/03/16 0729 10/03/16 1135  GLUCAP 143* 132* 129* 119* 133*   Lipid Profile: No results for input(s): CHOL, HDL, LDLCALC, TRIG, CHOLHDL, LDLDIRECT in the last 72 hours. Thyroid Function Tests: No results for input(s): TSH, T4TOTAL, FREET4, T3FREE, THYROIDAB in the last 72 hours. Anemia Panel: No results for input(s): VITAMINB12, FOLATE, FERRITIN, TIBC, IRON, RETICCTPCT in the last 72 hours. Sepsis Labs: No results for input(s): PROCALCITON, LATICACIDVEN in the last 168 hours.  No results found for this or any previous visit (from the past 240 hour(s)).     Radiology Studies: Dg Abd Portable 1v  Result Date: 10/02/2016 CLINICAL DATA:  Small bowel obstruction EXAM: PORTABLE ABDOMEN - 1 VIEW COMPARISON:  Earlier today FINDINGS: Nasogastric tube tip and side-port over the stomach. Bowel gas pattern is normal. Some high density near the left diaphragm could be within the fundus of the stomach. Previous hernia repair using mesh. No concerning mass effect or calcification. IMPRESSION: 1. Normal bowel gas pattern. 2. If enteric contrast was administered it has diluted and is not seen in small bowel or colon. 3. Nasogastric tube in good position. Electronically Signed   By: Marnee Spring M.D.   On: 10/02/2016 14:56   Dg Abd Portable 1v  Result Date: 10/02/2016 CLINICAL DATA:  Small bowel obstruction, follow-up study. History of diabetes, peripheral vascular disease. EXAM: PORTABLE ABDOMEN - 1 VIEW COMPARISON:  Lower chest and upper abdominal radiograph of October 01, 2016 FINDINGS: The colonic stool burden is moderate. There is no small or large bowel  obstructive pattern. There is stool and gas in the rectum. There numerous coils present over the pelvis from previous hernia repair. The esophagogastric tube tip and proximal port project below the GE junction. There are degenerative changes of the lower lumbar spine. IMPRESSION: No evidence of small bowel obstruction. There is a moderate amount of stool within the colon and rectum. Electronically Signed   By: David  [place] M.D.   On: 10/02/2016 07:15      Scheduled Meds: . enoxaparin (LOVENOX) injection  40 mg Subcutaneous Q24H  . insulin aspart  0-9 Units Subcutaneous Q4H  . levothyroxine  50 mcg Intravenous Daily  . pantoprazole (PROTONIX) IV  40 mg Intravenous Q24H   Continuous Infusions: . 0.9 % NaCl  with KCl 20 mEq / L 75 mL/hr at 10/02/16 1225  . methocarbamol (ROBAXIN)  IV       LOS: 1 day    Time spent: 30 minutes   Noralee Stain, DO [facility] [email or web address] Password [email or web address] 10/03/2016, 1:10 PM

## 2016-10-04 LAB — CBC WITH DIFFERENTIAL/PLATELET
BASOS PCT: 0 %
Basophils Absolute: 0 10*3/uL (ref 0.0–0.1)
EOS ABS: 0.1 10*3/uL (ref 0.0–0.7)
Eosinophils Relative: 1 %
HEMATOCRIT: 40.8 % (ref 36.0–46.0)
Hemoglobin: 13.9 g/dL (ref 12.0–15.0)
LYMPHS ABS: 2.1 10*3/uL (ref 0.7–4.0)
Lymphocytes Relative: 41 %
MCH: 28 pg (ref 26.0–34.0)
MCHC: 34.1 g/dL (ref 30.0–36.0)
MCV: 82.3 fL (ref 78.0–100.0)
Monocytes Absolute: 0.4 10*3/uL (ref 0.1–1.0)
Monocytes Relative: 8 %
NEUTROS ABS: 2.5 10*3/uL (ref 1.7–7.7)
Neutrophils Relative %: 50 %
Platelets: 204 10*3/uL (ref 150–400)
RBC: 4.96 MIL/uL (ref 3.87–5.11)
RDW: 13.6 % (ref 11.5–15.5)
WBC: 5 10*3/uL (ref 4.0–10.5)

## 2016-10-04 LAB — BASIC METABOLIC PANEL
ANION GAP: 10 (ref 5–15)
BUN: 8 mg/dL (ref 6–20)
CO2: 24 mmol/L (ref 22–32)
Calcium: 8.8 mg/dL — ABNORMAL LOW (ref 8.9–10.3)
Chloride: 104 mmol/L (ref 101–111)
Creatinine, Ser: 0.91 mg/dL (ref 0.44–1.00)
GFR calc non Af Amer: >60 mL/min (ref >60)
Glucose, Bld: 118 mg/dL — ABNORMAL HIGH (ref 65–99)
POTASSIUM: 3.5 mmol/L (ref 3.5–5.1)
SODIUM: 138 mmol/L (ref 135–145)

## 2016-10-04 LAB — GLUCOSE, CAPILLARY
GLUCOSE-CAPILLARY: 121 mg/dL — AB (ref 65–99)
GLUCOSE-CAPILLARY: 123 mg/dL — AB (ref 65–99)
GLUCOSE-CAPILLARY: 177 mg/dL — AB (ref 65–99)
GLUCOSE-CAPILLARY: 185 mg/dL — AB (ref 65–99)
Glucose-Capillary: 165 mg/dL — ABNORMAL HIGH (ref 65–99)
Glucose-Capillary: 184 mg/dL — ABNORMAL HIGH (ref 65–99)

## 2016-10-04 LAB — MAGNESIUM: MAGNESIUM: 1.6 mg/dL — AB (ref 1.7–2.4)

## 2016-10-04 MED ORDER — MAGNESIUM SULFATE 2 GM/50ML IV SOLN
2.0000 g | Freq: Once | INTRAVENOUS | Status: AC
  Administered 2016-10-04: 2 g via INTRAVENOUS
  Filled 2016-10-04: qty 50

## 2016-10-04 NOTE — Progress Notes (Signed)
PROGRESS NOTE    [name]  [id number] DOB: 01/20/1956 DOA: 10/01/2016 PCP: Mila PalmerWolters, Sharon, MD     Brief Narrative:  Amanda SaltsMary A Hood is a 61 y.o. female with medical history significant of previous episodes of SBO, hx of hysterectomy, previous SBO requiring surgical intervention by Dr. Michaell CowingGross in 2009, ventral hernia repair, with most recent SBO episode in May 2018 [facility]. She presents with nausea, vomiting 3 and abdominal pain starting at 3 AM this morning. CT abd/pelvis with early small bowel obstruction at mid abdomen and right hemipelvis just proximal to small bowel anastomosis. NGT placed. General surgery consulted.   Assessment & Plan:   Principal Problem:   SBO (small bowel obstruction) (HCC)  SBO -CT abd/pelvis showed early small bowel obstruction at mid abdomen and right hemipelvis just proximal to small bowel anastomosis  -Abdominal series showed normal bowel gas pattern, not seen in small bowel or colon  -Remove NGT today  -Advance diet to clears, then full liquid  -CCS following  -Pain control and antiemetic prn   Hypomagnesemia -Replace, trend   HTN -Hold oral meds until she can tolerate liquid diet today. Hydralazine and labetalol prn  -BP stable   DM type 2 -SSI   Hypothyroidism -Synthroid IV until she can tolerate liquid diet today  HLD -Hold crestor until she can tolerate liquid diet today  Class II Obesity -Body mass index is 37.44 kg/m.  DVT prophylaxis: lovenox Code Status: full Family Communication: no family at bedside Disposition Plan: pending improvement, home 8/31 if tolerates diet further    Consultants:   CCS  Procedures:   None  Antimicrobials:  Anti-infectives    None       Subjective: Much better today. Had 3 BM so far, passing gas. Pain has improved. No nausea or vomiting. Ambulated yesterday  Objective: Vitals:   10/03/16 0428 10/03/16 1430 10/03/16 2000 10/04/16 0401  BP: (!)  129/57 (!) 164/82 (!) 149/71 (!) 155/81  Pulse: 70 74 67 90  Resp: 18 18 18 18   Temp: 98 F (36.7 C) 98.9 F (37.2 C) 98.6 F (37 C) 98.4 F (36.9 C)  TempSrc: Oral Oral Oral Oral  SpO2: 97% 96% 96% 96%  Weight:      Height:        Intake/Output Summary (Last 24 hours) at 10/04/16 1200 Last data filed at 10/04/16 0629  Gross per 24 hour  Intake          1751.25 ml  Output              270 ml  Net          1481.25 ml   Filed Weights   10/01/16 0554  Weight: 102.1 kg (225 lb)    Examination:  General exam: Appears calm and comfortable  Respiratory system: Clear to auscultation. Respiratory effort normal. Cardiovascular system: S1 & S2 heard, RRR. No JVD, murmurs, rubs, gallops or clicks. No pedal edema. Gastrointestinal system: Abdomen is nondistended, soft and nontender. No organomegaly or masses felt. +bowel sounds heard. Central nervous system: Alert and oriented. No focal neurological deficits. Extremities: Symmetric 5 x 5 power. Skin: No rashes, lesions or ulcers Psychiatry: Judgement and insight appear normal. Mood & affect appropriate.   Data Reviewed: I have personally reviewed following labs and imaging studies  CBC:  Recent Labs Lab 10/01/16 0557 10/02/16 0331 10/04/16 0324  WBC 4.5 4.3 5.0  NEUTROABS  --   --  2.5  HGB 15.5* 14.5  13.9  HCT 44.3 42.9 40.8  MCV 81.1 82.3 82.3  PLT 216 208 204   Basic Metabolic Panel:  Recent Labs Lab 10/01/16 0557 10/01/16 1519 10/02/16 0331 10/03/16 0333 10/04/16 0324  NA 133*  --  134* 136 138  K 2.9*  --  3.5 3.4* 3.5  CL 98*  --  103 104 104  CO2 24  --  23 22 24   GLUCOSE 216*  --  126* 135* 118*  BUN 16  --  15 11 8   CREATININE 0.93 0.92 1.00 0.95 0.91  CALCIUM 9.5  --  8.7* 8.6* 8.8*  MG 1.7  --   --  1.6* 1.6*   GFR: Estimated Creatinine Clearance: 75.9 mL/min (by C-G formula based on SCr of 0.91 mg/dL). Liver Function Tests:  Recent Labs Lab 10/01/16 0557  AST 39  ALT 38  ALKPHOS 79    BILITOT 1.1  PROT 8.1  ALBUMIN 4.0    Recent Labs Lab 10/01/16 0557  LIPASE 25   No results for input(s): AMMONIA in the last 168 hours. Coagulation Profile: No results for input(s): INR, PROTIME in the last 168 hours. Cardiac Enzymes: No results for input(s): CKTOTAL, CKMB, CKMBINDEX, TROPONINI in the last 168 hours. BNP (last 3 results) No results for input(s): PROBNP in the last 8760 hours. HbA1C: No results for input(s): HGBA1C in the last 72 hours. CBG:  Recent Labs Lab 10/03/16 1724 10/03/16 2002 10/03/16 2351 10/04/16 0356 10/04/16 0716  GLUCAP 123* 107* 124* 121* 123*   Lipid Profile: No results for input(s): CHOL, HDL, LDLCALC, TRIG, CHOLHDL, LDLDIRECT in the last 72 hours. Thyroid Function Tests: No results for input(s): TSH, T4TOTAL, FREET4, T3FREE, THYROIDAB in the last 72 hours. Anemia Panel: No results for input(s): VITAMINB12, FOLATE, FERRITIN, TIBC, IRON, RETICCTPCT in the last 72 hours. Sepsis Labs: No results for input(s): PROCALCITON, LATICACIDVEN in the last 168 hours.  No results found for this or any previous visit (from the past 240 hour(s)).     Radiology Studies: Dg Abd Portable 1v  Result Date: 10/02/2016 CLINICAL DATA:  Small bowel obstruction EXAM: PORTABLE ABDOMEN - 1 VIEW COMPARISON:  Earlier today FINDINGS: Nasogastric tube tip and side-port over the stomach. Bowel gas pattern is normal. Some high density near the left diaphragm could be within the fundus of the stomach. Previous hernia repair using mesh. No concerning mass effect or calcification. IMPRESSION: 1. Normal bowel gas pattern. 2. If enteric contrast was administered it has diluted and is not seen in small bowel or colon. 3. Nasogastric tube in good position. Electronically Signed   By: Marnee Spring M.D.   On: 10/02/2016 14:56      Scheduled Meds: . enoxaparin (LOVENOX) injection  40 mg Subcutaneous Q24H  . insulin aspart  0-9 Units Subcutaneous Q4H  . levothyroxine   50 mcg Intravenous Daily  . pantoprazole (PROTONIX) IV  40 mg Intravenous Q24H   Continuous Infusions: . magnesium sulfate 1 - 4 g bolus IVPB    . methocarbamol (ROBAXIN)  IV       LOS: 2 days    Time spent: 30 minutes   Noralee Stain, DO [facility] [email or web address] Password [email or web address] 10/04/2016, 12:00 PM

## 2016-10-04 NOTE — Progress Notes (Signed)
Patient ID: [name], female   DOB: May 13, 1955, 61 y.o.   MRN: [id number]  [facility] Surgery Progress Note     Subjective: CC- SBO Patient states that she had 2 BMs yesterday and 1 this morning. She continues to pass flatus. States that he abdominal pain is much improved. Denies n/v. Walked twice yesterday in halls.  Objective: Vital signs in last 24 hours: Temp:  [98.4 F (36.9 C)-98.9 F (37.2 C)] 98.4 F (36.9 C) (08/30 0401) Pulse Rate:  [67-90] 90 (08/30 0401) Resp:  [18] 18 (08/30 0401) BP: (149-164)/(71-82) 155/81 (08/30 0401) SpO2:  [96 %] 96 % (08/30 0401) Last BM Date: 10/03/16  Intake/Output from previous day: 08/29 0701 - 08/30 0700 In: 1751.3 [I.V.:1751.3] Out: 270 [Emesis/NG output:270] Intake/Output this shift: No intake/output data recorded.  PE: Gen:  Alert, NAD, pleasant HEENT: EOM's intact, pupils equal and round Card:  RRR, no M/G/R heard Pulm:  CTAB, no W/R/R, effort normal Abd: obese, soft, mild distension, few BS heard, no HSM, no hernia, nontender abdomen Ext:  No erythema, edema, or tenderness BUE/BLE  Psych: A&Ox3  Skin: no rashes noted, warm and dry  Lab Results:   Recent Labs  10/02/16 0331 10/04/16 0324  WBC 4.3 5.0  HGB 14.5 13.9  HCT 42.9 40.8  PLT 208 204   BMET  Recent Labs  10/03/16 0333 10/04/16 0324  NA 136 138  K 3.4* 3.5  CL 104 104  CO2 22 24  GLUCOSE 135* 118*  BUN 11 8  CREATININE 0.95 0.91  CALCIUM 8.6* 8.8*   PT/INR No results for input(s): LABPROT, INR in the last 72 hours. CMP     Component Value Date/Time   NA 138 10/04/2016 0324   K 3.5 10/04/2016 0324   CL 104 10/04/2016 0324   CO2 24 10/04/2016 0324   GLUCOSE 118 (H) 10/04/2016 0324   BUN 8 10/04/2016 0324   CREATININE 0.91 10/04/2016 0324   CALCIUM 8.8 (L) 10/04/2016 0324   PROT 8.1 10/01/2016 0557   ALBUMIN 4.0 10/01/2016 0557   AST 39 10/01/2016 0557   ALT 38 10/01/2016 0557   ALKPHOS 79 10/01/2016 0557   BILITOT 1.1  10/01/2016 0557   GFRNONAA >60 10/04/2016 0324   GFRAA >60 10/04/2016 0324   Lipase     Component Value Date/Time   LIPASE 25 10/01/2016 0557       Studies/Results: Dg Abd Portable 1v  Result Date: 10/02/2016 CLINICAL DATA:  Small bowel obstruction EXAM: PORTABLE ABDOMEN - 1 VIEW COMPARISON:  Earlier today FINDINGS: Nasogastric tube tip and side-port over the stomach. Bowel gas pattern is normal. Some high density near the left diaphragm could be within the fundus of the stomach. Previous hernia repair using mesh. No concerning mass effect or calcification. IMPRESSION: 1. Normal bowel gas pattern. 2. If enteric contrast was administered it has diluted and is not seen in small bowel or colon. 3. Nasogastric tube in good position. Electronically Signed   By: Marnee Spring M.D.   On: 10/02/2016 14:56    Anti-infectives: Anti-infectives    None       Assessment/Plan HTN DM HLD Obesity  SBO - history of multiple abdominal surgeries - CT 8/27 concerning for early small bowel obstruction at the mid abdomen and right hemipelvis just proximal to the small bowel anastomosis - XR 8/28 showed normal bowel gast pattern, no contrast seen - +BMx3 and flatus  ID - none FEN - IVF, clear liquids VTE - SCDs, lovenox  Foley - none Follow up - TBD  Plan - Bowel function returning. D/c NG tube and advance to clear liquids. encouraged patient to keep ambulating. Ok to advance to fulls for dinner if patient is tolerating clears.   LOS: 2 days    [name] , [facility] 10/04/2016, 7:48 AM Pager: [phone] Consults: [phone] Mon-Fri 7:00 am-4:30 pm Sat-Sun 7:00 am-11:30 am

## 2016-10-05 LAB — BASIC METABOLIC PANEL
Anion gap: 9 (ref 5–15)
CO2: 27 mmol/L (ref 22–32)
Calcium: 8.9 mg/dL (ref 8.9–10.3)
Chloride: 99 mmol/L — ABNORMAL LOW (ref 101–111)
Creatinine, Ser: 0.87 mg/dL (ref 0.44–1.00)
GFR calc Af Amer: >60 mL/min (ref >60)
GFR calc non Af Amer: >60 mL/min (ref >60)
GLUCOSE: 154 mg/dL — AB (ref 65–99)
POTASSIUM: 3 mmol/L — AB (ref 3.5–5.1)
Sodium: 135 mmol/L (ref 135–145)

## 2016-10-05 LAB — GLUCOSE, CAPILLARY
Glucose-Capillary: 159 mg/dL — ABNORMAL HIGH (ref 65–99)
Glucose-Capillary: 175 mg/dL — ABNORMAL HIGH (ref 65–99)

## 2016-10-05 LAB — MAGNESIUM: Magnesium: 1.7 mg/dL (ref 1.7–2.4)

## 2016-10-05 NOTE — Discharge Instructions (Signed)
Stay on SOFT DIET for 2 weeks     Soft-Food Meal Plan A soft-food meal plan includes foods that are safe and easy to swallow. This meal plan typically is used:  If you are having trouble chewing or swallowing foods.  As a transition meal plan after only having had liquid meals for a long period.  What do I need to know about the soft-food meal plan? A soft-food meal plan includes tender foods that are soft and easy to chew and swallow. In most cases, bite-sized pieces of food are easier to swallow. A bite-sized piece is about  inch or smaller. Foods in this plan do not need to be ground or pureed. Foods that are very hard, crunchy, or sticky should be avoided. Also, breads, cereals, yogurts, and desserts with nuts, seeds, or fruits should be avoided. What foods can I eat? Grains Rice and wild rice. Moist bread, dressing, pasta, and noodles. Well-moistened dry or cooked cereals, such as farina (cooked wheat cereal), oatmeal, or grits. Biscuits, breads, muffins, pancakes, and waffles that have been well moistened. Vegetables Shredded lettuce. Cooked, tender vegetables, including potatoes without skins. Vegetable juices. Broths or creamed soups made with vegetables that are not stringy or chewy. Strained tomatoes (without seeds). Fruits Canned or well-cooked fruits. Soft (ripe), peeled fresh fruits, such as peaches, nectarines, kiwi, cantaloupe, honeydew melon, and watermelon (without seeds). Soft berries with small seeds, such as strawberries. Fruit juices (without pulp). Meats and Other Protein Sources Moist, tender, lean beef. Mutton. Lamb. Veal. Chicken. [place]. Liver. Ham. Fish without bones. Eggs. Dairy Milk, milk drinks, and cream. Plain cream cheese and cottage cheese. Plain yogurt. Sweets/Desserts Flavored gelatin desserts. Custard. Plain ice cream, frozen yogurt, sherbet, milk shakes, and malts. Plain cakes and cookies. Plain hard candy. Other Butter, margarine (without trans  fat), and cooking oils. Mayonnaise. Cream sauces. Mild spices, salt, and sugar. Syrup, molasses, honey, and jelly. The items listed above may not be a complete list of recommended foods or beverages. Contact your dietitian for more options. What foods are not recommended? Grains Dry bread, toast, crackers that have not been moistened. Coarse or dry cereals, such as bran, granola, and shredded wheat. Tough or chewy crusty breads, such as [place] bread or baguettes. Vegetables Corn. Raw vegetables except shredded lettuce. Cooked vegetables that are tough or stringy. Tough, crisp, fried potatoes and potato skins. Fruits Fresh fruits with skins or seeds or both, such as apples, pears, or grapes. Stringy, high-pulp fruits, such as papaya, pineapple, coconut, or mango. Fruit leather, fruit roll-ups, and all dried fruits. Meats and Other Protein Sources Sausages and hot dogs. Meats with gristle. Fish with bones. Nuts, seeds, and chunky peanut or other nut butters. Sweets/Desserts Cakes or cookies that are very dry or chewy. The items listed above may not be a complete list of foods and beverages to avoid. Contact your dietitian for more information. This information is not intended to replace advice given to you by your health care provider. Make sure you discuss any questions you have with your health care provider. Document Released: 05/01/2007 Document Revised: 06/30/2015 Document Reviewed: 12/19/2012 Elsevier Interactive Patient Education  2017 [employer]

## 2016-10-05 NOTE — Discharge Summary (Signed)
Physician Discharge Summary  [name] ZOX:[id number] DOB: [birth year] DOA: 10/01/2016  PCP: Mila Palmer, MD  Admit date: 10/01/2016 Discharge date: 10/05/2016  Admitted From: Home Disposition:  Home   Recommendations for Outpatient Follow-up:  1. Follow up with PCP in 2 weeks 2. Please obtain BMP, Mg in 1 week   Discharge Condition: Stable CODE STATUS: Full  Diet recommendation: Soft diet for 2 weeks   Brief/Interim Summary: Amanda Shuffield Jamisonis a 61 y.o.femalewith medical history significant of previous episodes of SBO, hx of hysterectomy, previous SBO requiring surgical intervention by Dr. Michaell Cowing in 2009, ventral hernia repair, with most recent SBO episode in May 2018 with hospitalization at [facility]. She presents with nausea, vomiting 3 and abdominal pain starting at 3 AM this morning. CT abd/pelvis with early small bowel obstruction at mid abdomen and right hemipelvis just proximal to small bowel anastomosis. NGT placed. General surgery consulted. She was treated conservatively with bowel rest and IV fluids. She continued to have slow improvement. NG tube was removed, diet, advance and tolerating well. She had multiple bowel movements, passing gas, tolerating diet prior to discharge. She was recommended to continue soft diet for 2 weeks and follow-up with her primary care physician.  Discharge Diagnoses:  Principal Problem:   SBO (small bowel obstruction) (HCC)  SBO -CT abd/pelvis showed early small bowel obstruction at mid abdomen and right hemipelvis just proximal to small bowel anastomosis  -Abdominal series showed normal bowel gas pattern, not seen in small bowel or colon  -Improved with conservative management -Continue soft diet for 2 weeks   HTN -Resume home tenoretic   DM type 2 with polyneuropathy  -Resume home insulin, neurontin  Hypothyroidism -Resume synthroid   HLD -Resume crestor   Class II Obesity -Body mass index is 37.44 kg/m.   Discharge  Instructions  Discharge Instructions    Call MD for:  difficulty breathing, headache or visual disturbances    Complete by:  As directed    Call MD for:  extreme fatigue    Complete by:  As directed    Call MD for:  hives    Complete by:  As directed    Call MD for:  persistant dizziness or light-headedness    Complete by:  As directed    Call MD for:  persistant nausea and vomiting    Complete by:  As directed    Call MD for:  severe uncontrolled pain    Complete by:  As directed    Call MD for:  temperature >100.4    Complete by:  As directed    Discharge instructions    Complete by:  As directed    You were cared for by a hospitalist during your hospital stay. If you have any questions about your discharge medications or the care you received while you were in the hospital after you are discharged, you can call the unit and asked to speak with the hospitalist on call if the hospitalist that took care of you is not available. Once you are discharged, your primary care physician will handle any further medical issues. Please note that NO REFILLS for any discharge medications will be authorized once you are discharged, as it is imperative that you return to your primary care physician (or establish a relationship with a primary care physician if you do not have one) for your aftercare needs so that they can reassess your need for medications and monitor your lab values.   Increase activity slowly  Complete by:  As directed      Allergies as of 10/05/2016      Reactions   Strawberry Extract Anaphylaxis   Cymbalta [duloxetine Hcl] Other (See Comments)   Dizzy   Lisinopril Itching, Swelling   Angioedema   Lunesta [eszopiclone] Other (See Comments)   Bad taste in mouth    Metformin And Related Other (See Comments)   Damaged patient's kidneys   Other Other (See Comments)   Pt is a Jehovah Witness. No blood products.   Sulfa Antibiotics Itching, Swelling   Angioedema   Tramadol  Other (See Comments)   Hallucinations   Trazodone And Nefazodone Other (See Comments)   Shakes   Penicillins Rash, Other (See Comments)   "Paralyzed" (per patient) Has patient had a PCN reaction causing immediate rash, facial/tongue/throat swelling, SOB or lightheadedness with hypotension: Yes Has patient had a PCN reaction causing severe rash involving mucus membranes or skin necrosis: No Has patient had a PCN reaction that required hospitalization Yes Has patient had a PCN reaction occurring within the last 10 years: No If all of the above answers are "NO", then may proceed with Cephalosporin use.      Medication List    STOP taking these medications   levofloxacin 500 MG tablet Commonly known as:  LEVAQUIN     TAKE these medications   amLODipine 10 MG tablet Commonly known as:  NORVASC Take 10 mg by mouth daily.   aspirin EC 81 MG tablet Take 81 mg by mouth daily.   atenolol-chlorthalidone 100-25 MG tablet Commonly known as:  TENORETIC Take 1 tablet by mouth daily.   diphenhydrAMINE 25 mg capsule Commonly known as:  BENADRYL Take 1 capsule (25 mg total) by mouth every 6 (six) hours. What changed:  when to take this  reasons to take this   insulin NPH Human 100 UNIT/ML injection Commonly known as:  HUMULIN N,NOVOLIN N Inject 10 Units into the skin 3 (three) times daily.   levothyroxine 112 MCG tablet Commonly known as:  SYNTHROID, LEVOTHROID Take 112 mcg by mouth daily before breakfast.   LYRICA 50 MG capsule Generic drug:  pregabalin Take 50 mg by mouth 3 (three) times daily.   NOVOLOG MIX 70/30 FLEXPEN (70-30) 100 UNIT/ML FlexPen Generic drug:  insulin aspart protamine - aspart Inject 20-30 Units into the skin 2 (two) times daily. 30 units in the morning and 20 units at night.   PROAIR HFA 108 (90 Base) MCG/ACT inhaler Generic drug:  albuterol Inhale 1-2 puffs into the lungs every 6 (six) hours as needed for shortness of breath.   rosuvastatin 20 MG  tablet Commonly known as:  CRESTOR Take 20 mg by mouth daily with breakfast.   zolpidem 10 MG tablet Commonly known as:  AMBIEN Take 10 mg by mouth at bedtime as needed for sleep.            Discharge Care Instructions        Start     Ordered   10/05/16 0000  Increase activity slowly     10/05/16 0920   10/05/16 0000  Discharge instructions    Comments:  You were cared for by a hospitalist during your hospital stay. If you have any questions about your discharge medications or the care you received while you were in the hospital after you are discharged, you can call the unit and asked to speak with the hospitalist on call if the hospitalist that took care of you is not available. Once  you are discharged, your primary care physician will handle any further medical issues. Please note that NO REFILLS for any discharge medications will be authorized once you are discharged, as it is imperative that you return to your primary care physician (or establish a relationship with a primary care physician if you do not have one) for your aftercare needs so that they can reassess your need for medications and monitor your lab values.   10/05/16 0920   10/05/16 0000  Call MD for:  temperature >100.4     10/05/16 0920   10/05/16 0000  Call MD for:  persistant nausea and vomiting     10/05/16 0920   10/05/16 0000  Call MD for:  severe uncontrolled pain     10/05/16 0920   10/05/16 0000  Call MD for:  extreme fatigue     10/05/16 0920   10/05/16 0000  Call MD for:  persistant dizziness or light-headedness     10/05/16 0920   10/05/16 0000  Call MD for:  hives     10/05/16 0920   10/05/16 0000  Call MD for:  difficulty breathing, headache or visual disturbances     10/05/16 0920     Follow-up Information    Mila Palmer, MD. Schedule an appointment as soon as possible for a visit in 2 week(s).   Specialty:  Family Medicine Contact information: [address] [place] [address] [phone]          Allergies  Allergen Reactions  . Strawberry Extract Anaphylaxis  . Cymbalta [Duloxetine Hcl] Other (See Comments)    Dizzy   . Lisinopril Itching and Swelling    Angioedema   . Lunesta [Eszopiclone] Other (See Comments)    Bad taste in mouth   . Metformin And Related Other (See Comments)    Damaged patient's kidneys  . Other Other (See Comments)    Pt is a Jehovah Witness. No blood products.  . Sulfa Antibiotics Itching and Swelling    Angioedema   . Tramadol Other (See Comments)    Hallucinations   . Trazodone And Nefazodone Other (See Comments)    Shakes   . Penicillins Rash and Other (See Comments)    "Paralyzed" (per patient) Has patient had a PCN reaction causing immediate rash, facial/tongue/throat swelling, SOB or lightheadedness with hypotension: Yes Has patient had a PCN reaction causing severe rash involving mucus membranes or skin necrosis: No Has patient had a PCN reaction that required hospitalization Yes Has patient had a PCN reaction occurring within the last 10 years: No If all of the above answers are "NO", then may proceed with Cephalosporin use.     Consultations:  General Surgery    Procedures/Studies: Ct Abdomen Pelvis W Contrast  Result Date: 10/01/2016 CLINICAL DATA:  61 year old female with vomiting and diffuse abdominal pain for several hours. History of bowel obstruction. EXAM: CT ABDOMEN AND PELVIS WITH CONTRAST TECHNIQUE: Multidetector CT imaging of the abdomen and pelvis was performed using the standard protocol following bolus administration of intravenous contrast. CONTRAST:  100 mL Isovue-300 COMPARISON:  05/27/2013, 02/08/2012 FINDINGS: Lower chest: No acute abnormality. Hepatobiliary: No focal liver abnormality is seen. No gallstones, gallbladder wall thickening, or biliary dilatation. Pancreas: Unremarkable. No pancreatic ductal dilatation or surrounding inflammatory changes. Spleen:  Normal in size without focal abnormality. Adrenals/Urinary Tract: Adrenal glands are unremarkable. Kidneys are normal, without renal calculi, focal lesion, or hydronephrosis. Bladder is unremarkable. Stomach/Bowel: Stomach is within normal limits. Appendix is surgically  absent. Dilated, fluid-filled loops of small bowel with diffuse localization are identified in the mid abdomen and right hemipelvis. This is just proximal to the area of the small bowel anastomosis in the right pelvis. There is focal dilatation up to 4.6 cm with fecalization of the contents at the anastomosis (PACs axial slice 66 of 88). The small and large bowel distal to the anastomosis tapers to a normal caliber. Vascular/Lymphatic: No significant vascular findings are present. No enlarged abdominal or pelvic lymph nodes. Reproductive: Status post hysterectomy. No adnexal masses. Other: Status post hernia mesh repair. No abdominopelvic ascites. Musculoskeletal: Stable degenerative changes, most prominent at L5-S1 with grade 1 anterolisthesis. No acute or significant osseous findings. IMPRESSION: 1. Findings concerning for early small bowel obstruction at the mid abdomen and right hemipelvis just proximal to the small bowel anastomosis. There is focal dilatation up to 4.6 cm and fecalization at the level of the anastomosis which tapers distally. 2. Other stable and chronic findings as detailed. Electronically Signed   By: Sande Brothers M.D.   On: 10/01/2016 08:30   Mr Foot Left Wo Contrast  Result Date: 09/23/2016 CLINICAL DATA:  Patient stepped on broken glass 3 weeks ago with pain and discoloration at the fourth/ fifth metatarsal heads. History diabetes. EXAM: MRI OF THE LEFT FOOT WITHOUT CONTRAST TECHNIQUE: Multiplanar, multisequence MR imaging of the left forefoot was performed. No intravenous contrast was administered. COMPARISON:  08/16/2016 radiographs of the left foot FINDINGS: Bones/Joint/Cartilage Nonspecific marrow edema is noted  across the fourth and fifth MTP articulations involving the distal fourth and fifth metatarsal head and necks as well as base of the fourth and fifth proximal phalanges. This may represent reactive marrow edema secondary to recent trauma. Early changes of osteomyelitis in particular the fifth metatarsal head and neck juncture are not entirely excluded. No joint effusion or focal chondral defects. Ligaments Negative Muscles and Tendons No pyomyositis. The extensor and flexor tendons crossing the forefoot are of normal signal intensity morphology. Soft tissues Mild periarticular soft tissue edema seen about fourth and fifth MTP joints more so dorsally. No focal abscess collection. No focal soft tissue foreign body is apparent. IMPRESSION: 1. Soft tissue edema consistent with cellulitis about the fourth and fifth metatarsal phalangeal joints. 2. Nonspecific marrow edema involving the distal fourth and fifth metatarsals and base of their respective proximal phalanges. Findings may be secondary to prior trauma and reactive marrow edema. However, early changes of an osteomyelitis in particular the fifth metatarsal head- neck junction are not entirely excluded. Electronically Signed   By: Tollie Eth M.D.   On: 09/23/2016 19:06   Dg Abd Portable 1v  Result Date: 10/02/2016 CLINICAL DATA:  Small bowel obstruction EXAM: PORTABLE ABDOMEN - 1 VIEW COMPARISON:  Earlier today FINDINGS: Nasogastric tube tip and side-port over the stomach. Bowel gas pattern is normal. Some high density near the left diaphragm could be within the fundus of the stomach. Previous hernia repair using mesh. No concerning mass effect or calcification. IMPRESSION: 1. Normal bowel gas pattern. 2. If enteric contrast was administered it has diluted and is not seen in small bowel or colon. 3. Nasogastric tube in good position. Electronically Signed   By: Marnee Spring M.D.   On: 10/02/2016 14:56   Dg Abd Portable 1v  Result Date:  10/02/2016 CLINICAL DATA:  Small bowel obstruction, follow-up study. History of diabetes, peripheral vascular disease. EXAM: PORTABLE ABDOMEN - 1 VIEW COMPARISON:  Lower chest and upper abdominal radiograph of October 01, 2016 FINDINGS:  The colonic stool burden is moderate. There is no small or large bowel obstructive pattern. There is stool and gas in the rectum. There numerous coils present over the pelvis from previous hernia repair. The esophagogastric tube tip and proximal port project below the GE junction. There are degenerative changes of the lower lumbar spine. IMPRESSION: No evidence of small bowel obstruction. There is a moderate amount of stool within the colon and rectum. Electronically Signed   By: David  SwazilandJordan M.D.   On: 10/02/2016 07:15   Dg Abd Portable 1 View  Result Date: 10/01/2016 CLINICAL DATA:  Nasogastric tube placement. EXAM: PORTABLE ABDOMEN - 1 VIEW COMPARISON:  Abdomen and pelvis CT dated 10/01/2016. FINDINGS: Nasogastric tube the tip at the gastroesophageal junction and side hole in the distal esophagus. Paucity of intestinal gas with no dilated bowel loops or free peritoneal air seen. Excreted contrast in the renal collecting systems. Enlarged heart and prominent interstitial lung markings. Mild levoconvex lumbar rotary scoliosis and mild thoracic spine degenerative changes. IMPRESSION: 1. Nasogastric tube tip at the gastroesophageal junction and side hole in the distal esophagus. Recommend this be advanced 14 cm. 2. Cardiomegaly and chronic interstitial lung disease with possible mild interstitial pulmonary edema. Electronically Signed   By: Beckie SaltsSteven  Reid M.D.   On: 10/01/2016 10:26       Discharge Exam: Vitals:   10/04/16 2001 10/05/16 0453  BP: (!) 152/80 (!) 151/75  Pulse: 79 77  Resp: 16 16  Temp: 98 F (36.7 C) 98.7 F (37.1 C)  SpO2: 95% 98%   Vitals:   10/04/16 0401 10/04/16 1416 10/04/16 2001 10/05/16 0453  BP: (!) 155/81 140/81 (!) 152/80 (!) 151/75   Pulse: 90 76 79 77  Resp: 18 17 16 16   Temp: 98.4 F (36.9 C) 98.6 F (37 C) 98 F (36.7 C) 98.7 F (37.1 C)  TempSrc: Oral Oral Oral Oral  SpO2: 96% 93% 95% 98%  Weight:      Height:        General: Pt is alert, awake, not in acute distress Cardiovascular: RRR, S1/S2 +, no rubs, no gallops Respiratory: CTA bilaterally, no wheezing, no rhonchi Abdominal: Soft, NT, ND, bowel sounds + Extremities: no edema, no cyanosis    The results of significant diagnostics from this hospitalization (including imaging, microbiology, ancillary and laboratory) are listed below for reference.     Microbiology: No results found for this or any previous visit (from the past 240 hour(s)).   Labs: BNP (last 3 results) No results for input(s): BNP in the last 8760 hours. Basic Metabolic Panel:  Recent Labs Lab 10/01/16 0557 10/01/16 1519 10/02/16 0331 10/03/16 0333 10/04/16 0324 10/05/16 0348  NA 133*  --  134* 136 138 135  K 2.9*  --  3.5 3.4* 3.5 3.0*  CL 98*  --  103 104 104 99*  CO2 24  --  23 22 24 27   GLUCOSE 216*  --  126* 135* 118* 154*  BUN 16  --  15 11 8  <5*  CREATININE 0.93 0.92 1.00 0.95 0.91 0.87  CALCIUM 9.5  --  8.7* 8.6* 8.8* 8.9  MG 1.7  --   --  1.6* 1.6* 1.7   Liver Function Tests:  Recent Labs Lab 10/01/16 0557  AST 39  ALT 38  ALKPHOS 79  BILITOT 1.1  PROT 8.1  ALBUMIN 4.0    Recent Labs Lab 10/01/16 0557  LIPASE 25   No results for input(s): AMMONIA in the last 168  hours. CBC:  Recent Labs Lab 10/01/16 0557 10/02/16 0331 10/04/16 0324  WBC 4.5 4.3 5.0  NEUTROABS  --   --  2.5  HGB 15.5* 14.5 13.9  HCT 44.3 42.9 40.8  MCV 81.1 82.3 82.3  PLT 216 208 204   Cardiac Enzymes: No results for input(s): CKTOTAL, CKMB, CKMBINDEX, TROPONINI in the last 168 hours. BNP: Invalid input(s): POCBNP CBG:  Recent Labs Lab 10/04/16 1822 10/04/16 1959 10/05/16 0001 10/05/16 0429 10/05/16 0731  GLUCAP 177* 185* 165* 159* 175*   D-Dimer No  results for input(s): DDIMER in the last 72 hours. Hgb A1c No results for input(s): HGBA1C in the last 72 hours. Lipid Profile No results for input(s): CHOL, HDL, LDLCALC, TRIG, CHOLHDL, LDLDIRECT in the last 72 hours. Thyroid function studies No results for input(s): TSH, T4TOTAL, T3FREE, THYROIDAB in the last 72 hours.  Invalid input(s): FREET3 Anemia work up No results for input(s): VITAMINB12, FOLATE, FERRITIN, TIBC, IRON, RETICCTPCT in the last 72 hours. Urinalysis    Component Value Date/Time   COLORURINE YELLOW 04/09/2014 1505   APPEARANCEUR CLEAR 04/09/2014 1505   LABSPEC 1.027 04/09/2014 1505   PHURINE 5.0 04/09/2014 1505   GLUCOSEU >1000 (A) 04/09/2014 1505   HGBUR NEGATIVE 04/09/2014 1505   BILIRUBINUR NEGATIVE 04/09/2014 1505   KETONESUR NEGATIVE 04/09/2014 1505   PROTEINUR NEGATIVE 04/09/2014 1505   UROBILINOGEN 0.2 04/09/2014 1505   NITRITE NEGATIVE 04/09/2014 1505   LEUKOCYTESUR NEGATIVE 04/09/2014 1505   Sepsis Labs Invalid input(s): PROCALCITONIN,  WBC,  LACTICIDVEN Microbiology No results found for this or any previous visit (from the past 240 hour(s)).   Time coordinating discharge: 40 minutes  SIGNED:  Noralee Stain, DO [facility] Pager [phone]  If 7PM-7AM, please contact night-coverage [email or web address] Password [facility] 10/05/2016, 12:18 PM

## 2016-10-05 NOTE — Progress Notes (Signed)
Assessment Principal Problem:   SBO (small bowel obstruction) (HCC)-resolved   Plan:  Advance to soft diet.  May discharge from our standpoint if tolerating diet and follow up with PCP.  Should stay on soft diet for 2 weeks.   LOS: 3 days        Chief Complaint/Subjective: Tolerating liquids.  Passing a lot of gas.  Ng tube fell out.  Objective: Vital signs in last 24 hours: Temp:  [98 F (36.7 C)-98.7 F (37.1 C)] 98.7 F (37.1 C) (08/31 0453) Pulse Rate:  [76-79] 77 (08/31 0453) Resp:  [16-17] 16 (08/31 0453) BP: (140-152)/(75-81) 151/75 (08/31 0453) SpO2:  [93 %-98 %] 98 % (08/31 0453) Last BM Date: 10/04/16  Intake/Output from previous day: 08/30 0701 - 08/31 0700 In: 1120 [P.O.:1070; IV Piggyback:50] Out: 1000 [Urine:1000] Intake/Output this shift: No intake/output data recorded.  PE: General- In NAD.  Awake and alert. Abdomen-soft, not tender  Lab Results:   Recent Labs  10/04/16 0324  WBC 5.0  HGB 13.9  HCT 40.8  PLT 204   BMET  Recent Labs  10/03/16 0333 10/04/16 0324  NA 136 138  K 3.4* 3.5  CL 104 104  CO2 22 24  GLUCOSE 135* 118*  BUN 11 8  CREATININE 0.95 0.91  CALCIUM 8.6* 8.8*   PT/INR No results for input(s): LABPROT, INR in the last 72 hours. Comprehensive Metabolic Panel:    Component Value Date/Time   NA 138 10/04/2016 0324   NA 136 10/03/2016 0333   K 3.5 10/04/2016 0324   K 3.4 (L) 10/03/2016 0333   CL 104 10/04/2016 0324   CL 104 10/03/2016 0333   CO2 24 10/04/2016 0324   CO2 22 10/03/2016 0333   BUN 8 10/04/2016 0324   BUN 11 10/03/2016 0333   CREATININE 0.91 10/04/2016 0324   CREATININE 0.95 10/03/2016 0333   GLUCOSE 118 (H) 10/04/2016 0324   GLUCOSE 135 (H) 10/03/2016 0333   CALCIUM 8.8 (L) 10/04/2016 0324   CALCIUM 8.6 (L) 10/03/2016 0333   AST 39 10/01/2016 0557   AST 60 (H) 04/09/2014 1537   ALT 38 10/01/2016 0557   ALT 53 (H) 04/09/2014 1537   ALKPHOS 79 10/01/2016 0557   ALKPHOS 135 (H) 04/09/2014  1537   BILITOT 1.1 10/01/2016 0557   BILITOT 0.8 04/09/2014 1537   PROT 8.1 10/01/2016 0557   PROT 7.6 04/09/2014 1537   ALBUMIN 4.0 10/01/2016 0557   ALBUMIN 4.0 04/09/2014 1537     Studies/Results: No results found.  Anti-infectives: Anti-infectives    None       Iqra Rotundo J 10/05/2016

## 2016-10-05 NOTE — Progress Notes (Signed)
[id number]: Patient's NG tube is out. Pt stated it "slipped" no s/s of distress. NG tube intact. Patient currently tolerating diet per order. Will continue to monitor. Floor Coverage MD X. Blount notified.

## 2016-10-05 NOTE — Care Management Note (Signed)
Case Management Note  Patient Details  Name: [name] MRN: [id number] Date of Birth: 03/01/1955  Subjective/Objective:           61 yo admitted with SBO. Hx of SBO in past.         Action/Plan: Home  Expected Discharge Date:  10/05/16               Expected Discharge Plan:  Home/Self Care  In-House Referral:     Discharge planning Services  CM Consult  Post Acute Care Choice:    Choice offered to:     DME Arranged:    DME Agency:     HH Arranged:    HH Agency:     Status of Service:  Completed, signed off  If discussed at [employer] Length of Stay Meetings, dates discussed:    Additional CommentsBartholome Bill:  Sayan Aldava H, RN 10/05/2016, 9:54 AM  [phone]

## 2017-01-03 ENCOUNTER — Encounter (INDEPENDENT_AMBULATORY_CARE_PROVIDER_SITE_OTHER): Payer: Self-pay | Admitting: Orthopedic Surgery

## 2017-01-03 ENCOUNTER — Ambulatory Visit (INDEPENDENT_AMBULATORY_CARE_PROVIDER_SITE_OTHER): Payer: BLUE CROSS/BLUE SHIELD

## 2017-01-03 ENCOUNTER — Ambulatory Visit (INDEPENDENT_AMBULATORY_CARE_PROVIDER_SITE_OTHER): Payer: BLUE CROSS/BLUE SHIELD | Admitting: Orthopedic Surgery

## 2017-01-03 VITALS — Ht 65.0 in | Wt 220.0 lb

## 2017-01-03 DIAGNOSIS — M79604 Pain in right leg: Secondary | ICD-10-CM | POA: Insufficient documentation

## 2017-01-03 DIAGNOSIS — M4316 Spondylolisthesis, lumbar region: Secondary | ICD-10-CM
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
## 2017-01-04 ENCOUNTER — Other Ambulatory Visit (INDEPENDENT_AMBULATORY_CARE_PROVIDER_SITE_OTHER): Payer: Self-pay

## 2017-01-04 DIAGNOSIS — M5441 Lumbago with sciatica, right side: Principal | ICD-10-CM

## 2017-01-04 DIAGNOSIS — G8929 Other chronic pain: Secondary | ICD-10-CM

## 2017-01-23 ENCOUNTER — Other Ambulatory Visit: Payer: Self-pay | Admitting: Neurological Surgery

## 2017-01-25 ENCOUNTER — Telehealth: Payer: Self-pay | Admitting: Vascular Surgery

## 2017-01-25 NOTE — Telephone Encounter (Signed)
TFE approved 02/19/17 4:15 appt for pt. Lm on hm# to inform pt of appt.

## 2017-01-25 NOTE — Telephone Encounter (Signed)
Office appointment needed for the following patients.  Received: 2 days ago  Message Contents  Retta MacRoberts, Rebecca J, RN  P [facility] Admin Pool        Please schedule office appointments and call the following patients with Dr. Arbie CookeyEarly for ALIF.   Cherie DarkMary Starkes [id number]/12/25/1955/ surgery date is 02/25/17   Thank you, Kriste BasqueBecky

## 2017-01-31 ENCOUNTER — Other Ambulatory Visit (HOSPITAL_COMMUNITY): Payer: Self-pay | Admitting: Neurological Surgery

## 2017-01-31 DIAGNOSIS — M4317 Spondylolisthesis, lumbosacral region: Secondary | ICD-10-CM

## 2017-02-13 ENCOUNTER — Other Ambulatory Visit: Payer: Self-pay | Admitting: *Deleted

## 2017-02-19 ENCOUNTER — Encounter: Payer: Self-pay | Admitting: Vascular Surgery

## 2017-02-19 ENCOUNTER — Ambulatory Visit: Payer: BLUE CROSS/BLUE SHIELD | Admitting: Vascular Surgery

## 2017-02-19 VITALS — BP 145/82 | HR 62 | Resp 18 | Ht 65.0 in | Wt 235.6 lb

## 2017-02-19 DIAGNOSIS — M5137 Other intervertebral disc degeneration, lumbosacral region: Secondary | ICD-10-CM | POA: Diagnosis not present

## 2017-02-19 NOTE — Progress Notes (Signed)
Vascular and Vein Specialist of [facility]  Patient name: [name] MRN: [id number] DOB: 1955-03-01 Sex: female  REASON FOR CONSULT: Discuss anterior exposure for L5-S1 surgery  HPI: [name] is a 62 y.o. female, who is here today for discussion of anterior exposure for L5-S1 surgery.  She has been seen by Dr.Ditty who has recommended spine surgery for her degenerative disc disease and an anterior approach for fusion.  She works as a Financial risk analyst and has had increasingly severe back pain extending down into her legs.  She reports that the end of the day she is barely able to lift her leg to get into her vehicle.  He had seen a chiropractor on multiple occasions with no relief and is undergoing further imaging and is been recommended to have surgery.  She has had prior cesarean section through a low Pfannenstiel incision.  No other intra-abdominal surgery.  She does have a prior history of carotid endarterectomy in her left carotid with Dr. Myra Gianotti several years ago.  Is done well  Past Medical History:  Diagnosis Date  . Anxiety   . CAD (coronary artery disease)   . Carotid artery occlusion   . Chronic pain following surgery or procedure 2008   Abdominal pain  . Diabetes mellitus   . Headache(784.0)   . Hypertension    stress test- scheduled for 12/30/2012  . Hypothyroidism   . Myocardial infarction (HCC) 2000  . Shortness of breath   . Stroke (HCC) 11/2012   tingling in L arm , slurred speech- came to ER    Family History  Problem Relation Age of Onset  . CAD Brother         X 2  . Diabetes Brother   . Heart disease Brother        before age 2  . Hyperlipidemia Brother   . Hypertension Brother   . Heart attack Brother   . CAD Sister         X 1  . Cancer Sister   . Diabetes Sister   . Heart disease Sister        before age 90  . Hyperlipidemia Sister   . Hypertension Sister   . Hypertension Unknown        ALL  . Diabetes Mother     . Heart disease Mother   . Hyperlipidemia Mother   . Hypertension Mother   . Heart disease Father   . Hyperlipidemia Father   . Hypertension Father   . Hypertension Daughter     SOCIAL HISTORY: Social History   Socioeconomic History  . Marital status: Widowed    Spouse name: Not on file  . Number of children: Not on file  . Years of education: Not on file  . Highest education level: Not on file  Social Needs  . Financial resource strain: Not on file  . Food insecurity - worry: Not on file  . Food insecurity - inability: Not on file  . Transportation needs - medical: Not on file  . Transportation needs - non-medical: Not on file  Occupational History  . Not on file  Tobacco Use  . Smoking status: Former Smoker    Types: Cigarettes    Last attempt to quit: 02/05/2001    Years since quitting: 16.0  . Smokeless tobacco: Never Used  Substance and Sexual Activity  . Alcohol use: Yes    Alcohol/week: 1.2 oz    Types: 2 Cans of beer per week  Comment: rarely  . Drug use: No  . Sexual activity: Not on file  Other Topics Concern  . Not on file  Social History Narrative  . Not on file    Allergies  Allergen Reactions  . Strawberry Extract Anaphylaxis  . Cymbalta [Duloxetine Hcl] Other (See Comments)    Dizzy   . Lisinopril Itching, Swelling and Other (See Comments)    Angioedema   . Lunesta [Eszopiclone] Other (See Comments)    Bad taste in mouth   . Metformin And Related Other (See Comments)    Damaged patient's kidneys  . Other Other (See Comments)    Pt is a Jehovah Witness. No blood products.  . Sulfa Antibiotics Itching, Swelling and Other (See Comments)    Angioedema   . Tramadol Other (See Comments)    Hallucinations   . Trazodone And Nefazodone Other (See Comments)    Shakes   . Penicillins Rash and Other (See Comments)    "Paralyzed" (per patient) Has patient had a PCN reaction causing immediate rash, facial/tongue/throat swelling, SOB or  lightheadedness with hypotension: Yes Has patient had a PCN reaction causing severe rash involving mucus membranes or skin necrosis: No Has patient had a PCN reaction that required hospitalization Yes Has patient had a PCN reaction occurring within the last 10 years: No If all of the above answers are "NO", then may proceed with Cephalosporin use.     Current Outpatient Medications  Medication Sig Dispense Refill  . aspirin EC 81 MG tablet Take 81 mg by mouth daily.    Marland Kitchen. atenolol-chlorthalidone (TENORETIC) 100-25 MG per tablet Take 1 tablet by mouth daily.    . diphenhydrAMINE (BENADRYL) 25 mg capsule Take 1 capsule (25 mg total) by mouth every 6 (six) hours. (Patient taking differently: Take 25 mg by mouth every 6 (six) hours as needed for itching. ) 4 capsule 0  . insulin aspart protamine - aspart (NOVOLOG MIX 70/30 FLEXPEN) (70-30) 100 UNIT/ML FlexPen Inject 20-30 Units into the skin See admin instructions. 30 units in the morning and 20 units at night.     . levothyroxine (SYNTHROID, LEVOTHROID) 112 MCG tablet Take 112 mcg by mouth daily before breakfast.    . NOVOLOG 100 UNIT/ML injection Inject 10 Units into the skin 3 (three) times daily.  12  . PROAIR HFA 108 (90 Base) MCG/ACT inhaler Inhale 2-3 puffs into the lungs every 6 (six) hours as needed for shortness of breath.   11  . rosuvastatin (CRESTOR) 20 MG tablet Take 20 mg by mouth daily with breakfast.     . zolpidem (AMBIEN) 10 MG tablet Take 10 mg by mouth at bedtime.      No current facility-administered medications for this visit.     REVIEW OF SYSTEMS:  [X]  denotes positive finding, [ ]  denotes negative finding Cardiac  Comments:  Chest pain or chest pressure:     Shortness of breath upon exertion:    Short of breath when lying flat:    Irregular heart rhythm:        Vascular    Pain in calf, thigh, or hip brought on by ambulation:    Pain in feet at night that wakes you up from your sleep:     Blood clot in your  veins:    Leg swelling:         Pulmonary    Oxygen at home:    Productive cough:     Wheezing:  Neurologic    Sudden weakness in arms or legs:     Sudden numbness in arms or legs:     Sudden onset of difficulty speaking or slurred speech:    Temporary loss of vision in one eye:     Problems with dizziness:         Gastrointestinal    Blood in stool:     Vomited blood:         Genitourinary    Burning when urinating:     Blood in urine:        Psychiatric    Major depression:         Hematologic    Bleeding problems:    Problems with blood clotting too easily:        Skin    Rashes or ulcers:        Constitutional    Fever or chills:      PHYSICAL EXAM: Vitals:   02/19/17 1600 02/19/17 1603  BP: (!) 143/79 (!) 145/82  Pulse: 62   Resp: 18   SpO2: 98%   Weight: 235 lb 9.6 oz (106.9 kg)   Height: 5\' 5"  (1.651 m)     GENERAL: The patient is a well-nourished female, in no acute distress. The vital signs are documented above. CARDIOVASCULAR: Carotid arteries without bruit bilaterally.  Well-healed left neck incision.  Easily palpable radial pulses bilaterally. PULMONARY: There is good air exchange  ABDOMEN: Soft and non-tender.  Obese. MUSCULOSKELETAL: There are no major deformities or cyanosis. NEUROLOGIC: No focal weakness or paresthesias are detected. SKIN: There are no ulcers or rashes noted. PSYCHIATRIC: The patient has a normal affect.  DATA:  Reviewed a CT scan from 2018 when she was having difficulty with partial small bowel obstruction.  This reveals minimal calcific plaque in her aorta and iliac segments  MEDICAL ISSUES: I discussed my role for anterior exposure to include mobilization of the rectus muscle, intra-peritoneal contents, left ureter, arterial and venous structures overlying the spine.  Also explained the potential for injury to all of these.  She is obese and has had prior cesarean section but I do not feel that she is a  prohibitive risk for surgery.  We will be prepared as scheduled for 02/25/2017   Larina Earthly, MD [facility] Vascular and Vein Specialists of [facility] Tel [phone] Pager [phone]

## 2017-02-21 ENCOUNTER — Encounter (HOSPITAL_COMMUNITY): Payer: Self-pay | Admitting: *Deleted

## 2017-02-21 ENCOUNTER — Encounter (HOSPITAL_COMMUNITY)
Admission: RE | Admit: 2017-02-21 | Discharge: 2017-02-21 | Disposition: A | Discharge status: Home / Self Care | Payer: BLUE CROSS/BLUE SHIELD | Source: Ambulatory Visit | Attending: Neurological Surgery | Admitting: Neurological Surgery

## 2017-02-21 ENCOUNTER — Encounter (HOSPITAL_COMMUNITY): Payer: Self-pay

## 2017-02-21 ENCOUNTER — Ambulatory Visit (HOSPITAL_COMMUNITY)
Admission: RE | Admit: 2017-02-21 | Discharge: 2017-02-21 | Disposition: A | Discharge status: Home / Self Care | Payer: BLUE CROSS/BLUE SHIELD | Source: Ambulatory Visit | Attending: Neurological Surgery | Admitting: Neurological Surgery

## 2017-02-21 ENCOUNTER — Encounter: Payer: Self-pay | Admitting: Physical Medicine and Rehabilitation

## 2017-02-21 DIAGNOSIS — Z01812 Encounter for preprocedural laboratory examination: Secondary | ICD-10-CM | POA: Insufficient documentation

## 2017-02-21 DIAGNOSIS — Z01818 Encounter for other preprocedural examination: Secondary | ICD-10-CM | POA: Insufficient documentation

## 2017-02-21 DIAGNOSIS — M48061 Spinal stenosis, lumbar region without neurogenic claudication: Secondary | ICD-10-CM | POA: Diagnosis not present

## 2017-02-21 DIAGNOSIS — M5126 Other intervertebral disc displacement, lumbar region: Secondary | ICD-10-CM | POA: Diagnosis not present

## 2017-02-21 DIAGNOSIS — M4686 Other specified inflammatory spondylopathies, lumbar region: Secondary | ICD-10-CM | POA: Insufficient documentation

## 2017-02-21 DIAGNOSIS — M4317 Spondylolisthesis, lumbosacral region: Secondary | ICD-10-CM | POA: Insufficient documentation

## 2017-02-21 HISTORY — DX: Unspecified osteoarthritis, unspecified site: M19.90

## 2017-02-21 LAB — SURGICAL PCR SCREEN
MRSA, PCR: NEGATIVE
STAPHYLOCOCCUS AUREUS: NEGATIVE

## 2017-02-21 MED ORDER — CHLORHEXIDINE GLUCONATE CLOTH 2 % EX PADS: 6.0000 | MEDICATED_PAD | Freq: Once | CUTANEOUS | Status: DC

## 2017-02-21 NOTE — Progress Notes (Signed)
Informed by preadmission phlebotomist that blood sample for CBC BMET HgA1C were placed into the sharps container by mistake. Message left for patient  To contact us regarding redraw on 1/18 or the DOS. Awaiting patient response.

## 2017-02-21 NOTE — Pre-Procedure Instructions (Signed)
Amanda SaltsMary A Hood  02/21/2017      [id number] - [place], [place] - [address] [facility] [id number] [place] [address] Phone: [phone] Fax: [phone]    Your procedure is scheduled on 02/25/17.  [facility] at 530 A.M.  Call this number if you have problems the morning of surgery:  [phone]    How to Manage Your Diabetes Before and After Surgery  Why is it important to control my blood sugar before and after surgery? . Improving blood sugar levels before and after surgery helps healing and can limit problems. . A way of improving blood sugar control is eating a healthy diet by: o  Eating less sugar and carbohydrates o  Increasing activity/exercise o  Talking with your doctor about reaching your blood sugar goals . High blood sugars (greater than 180 mg/dL) can raise your risk of infections and slow your recovery, so you will need to focus on controlling your diabetes during the weeks before surgery. . Make sure that the doctor who takes care of your diabetes knows about your planned surgery including the date and location.  How do I manage my blood sugar before surgery? . Check your blood sugar at least 4 times a day, starting 2 days before surgery, to make sure that the level is not too high or low. o Check your blood sugar the morning of your surgery when you wake up and every 2 hours until you get to the [name] unit. . If your blood sugar is less than 70 mg/dL, you will need to treat for low blood sugar: o Do not take insulin. o Treat a low blood sugar (less than 70 mg/dL) with  cup of clear juice (cranberry or apple), 4 glucose tablets, OR glucose gel. Recheck blood sugar in 15 minutes after treatment (to make sure it is greater than 70 mg/dL). If your blood sugar is not greater than 70 mg/dL on recheck, call [phone] o  for further instructions. . Report your blood  sugar to the [name] nurse when you get to [name].  . If you are admitted to the hospital after surgery: o Your blood sugar will be checked by the staff and you will probably be given insulin after surgery (instead of oral diabetes medicines) to make sure you have good blood sugar levels. o The goal for blood sugar control after surgery is 80-180 mg/dL.              WHAT DO I DO ABOUT MY DIABETES MEDICATION?   Marland Kitchen. Do not take oral diabetes medicines (pills) the morning of surgery.  . THE NIGHT BEFORE SURGERY, take ______14_____ units of _____novolog mix______insulin.       . THE MORNING OF SURGERY, take ____none_________ units of __________insulin.  . The day of surgery, do not take other diabetes injectables, including Byetta (exenatide), Bydureon (exenatide ER), Victoza (liraglutide), or Trulicity (dulaglutide).  . If your CBG is greater than 220 mg/dL, you may take  of your sliding scale (correction) dose of insulin.  Other Instructions:          Patient Signature:  Date:   Nurse Signature:  Date:   Reviewed and Endorsed by [facility] Patient Education Committee, August 2015   Remember:  Do not eat food or drink liquids after midnight.  Take these medicines the morning of surgery with A SIP OF WATER --tenoretic,synthroid,all inhalers Do  not wear jewelry, make-up or nail polish.  Do not wear lotions, powders, or perfumes, or deodorant.  Do not shave 48 hours prior to surgery.  Men may shave face and neck.  Do not bring valuables to the hospital.  [facility] is not responsible for any belongings or valuables.  Contacts, dentures or bridgework may not be worn into surgery.  Leave your suitcase in the car.  After surgery it may be brought to your room.  For patients admitted to the hospital, discharge time will be determined by your treatment team.  Patients discharged the day of surgery will not be allowed to drive home.   Name and phone number of  your driver:   Do not take any aspirin,anti-inflammatories,vitamins,or herbal supplements 5-7 days prior to surgery. Special instructions:    Please read over the following fact sheets that you were given. MRSA Information

## 2017-02-22 ENCOUNTER — Encounter (HOSPITAL_COMMUNITY)
Admission: RE | Admit: 2017-02-22 | Discharge: 2017-02-22 | Disposition: A | Discharge status: Home / Self Care | Payer: BLUE CROSS/BLUE SHIELD | Source: Ambulatory Visit | Attending: Neurological Surgery | Admitting: Neurological Surgery

## 2017-02-22 ENCOUNTER — Encounter (HOSPITAL_COMMUNITY): Payer: Self-pay | Admitting: Emergency Medicine

## 2017-02-22 DIAGNOSIS — M79604 Pain in right leg: Secondary | ICD-10-CM | POA: Insufficient documentation

## 2017-02-22 DIAGNOSIS — Z8679 Personal history of other diseases of the circulatory system: Secondary | ICD-10-CM | POA: Diagnosis not present

## 2017-02-22 DIAGNOSIS — M4316 Spondylolisthesis, lumbar region: Secondary | ICD-10-CM | POA: Diagnosis not present

## 2017-02-22 DIAGNOSIS — I739 Peripheral vascular disease, unspecified: Secondary | ICD-10-CM | POA: Insufficient documentation

## 2017-02-22 DIAGNOSIS — Z8673 Personal history of transient ischemic attack (TIA), and cerebral infarction without residual deficits: Secondary | ICD-10-CM | POA: Diagnosis not present

## 2017-02-22 DIAGNOSIS — Z01812 Encounter for preprocedural laboratory examination: Secondary | ICD-10-CM | POA: Diagnosis not present

## 2017-02-22 DIAGNOSIS — E119 Type 2 diabetes mellitus without complications: Secondary | ICD-10-CM | POA: Insufficient documentation

## 2017-02-22 DIAGNOSIS — E669 Obesity, unspecified: Secondary | ICD-10-CM | POA: Diagnosis not present

## 2017-02-22 DIAGNOSIS — E785 Hyperlipidemia, unspecified: Secondary | ICD-10-CM | POA: Diagnosis not present

## 2017-02-22 DIAGNOSIS — I160 Hypertensive urgency: Secondary | ICD-10-CM | POA: Insufficient documentation

## 2017-02-22 DIAGNOSIS — I959 Hypotension, unspecified: Secondary | ICD-10-CM | POA: Insufficient documentation

## 2017-02-22 DIAGNOSIS — E039 Hypothyroidism, unspecified: Secondary | ICD-10-CM | POA: Insufficient documentation

## 2017-02-22 DIAGNOSIS — I10 Essential (primary) hypertension: Secondary | ICD-10-CM | POA: Insufficient documentation

## 2017-02-22 LAB — CBC
HCT: 42.7 % (ref 36.0–46.0)
Hemoglobin: 14.6 g/dL (ref 12.0–15.0)
MCH: 27.7 pg (ref 26.0–34.0)
MCHC: 34.2 g/dL (ref 30.0–36.0)
MCV: 80.9 fL (ref 78.0–100.0)
PLATELETS: 209 10*3/uL (ref 150–400)
RBC: 5.28 MIL/uL — ABNORMAL HIGH (ref 3.87–5.11)
RDW: 13.4 % (ref 11.5–15.5)
WBC: 4.6 10*3/uL (ref 4.0–10.5)

## 2017-02-22 LAB — BASIC METABOLIC PANEL
Anion gap: 12 (ref 5–15)
BUN: 12 mg/dL (ref 6–20)
CALCIUM: 9.4 mg/dL (ref 8.9–10.3)
CO2: 25 mmol/L (ref 22–32)
CREATININE: 0.82 mg/dL (ref 0.44–1.00)
Chloride: 99 mmol/L — ABNORMAL LOW (ref 101–111)
GFR calc Af Amer: >60 mL/min (ref >60)
GLUCOSE: 400 mg/dL — AB (ref 65–99)
Potassium: 3.4 mmol/L — ABNORMAL LOW (ref 3.5–5.1)
Sodium: 136 mmol/L (ref 135–145)

## 2017-02-22 LAB — HEMOGLOBIN A1C
Hgb A1c MFr Bld: 7.7 % — ABNORMAL HIGH (ref 4.8–5.6)
Mean Plasma Glucose: 174.29 mg/dL

## 2017-02-22 NOTE — Progress Notes (Signed)
Called pt and let her know that her blood sugar was 400 today. Instructed her to watch her carb and sugar intake this weekend. I told to check her blood sugar 4 times a day this weekend to make sure it's not staying to high. She voiced understanding and stated that she had just eaten prior to having her blood drawn.

## 2017-02-22 NOTE — Progress Notes (Signed)
Anesthesia Chart Review:  Pt is a Jehovah's Witness  Pt is a 62 year old female scheduled for L5-S1 ALIF with plate fixation, L5-S1 percutaneous pedicle screw fixation with posterolateral fusion vs L5-S1 laminectomy with gill procedure, cortical screw fixation and posterior lateral fusion on 02/25/2017 with Cherrie DistanceBenjamin Ditty, MD and Gretta Beganodd Early, MD  - PCP is Mila PalmerSharon Wolters, MD  PMH includes: CAD, HTN, DM, carotid artery occlusion (s/p L CEA), stroke, hypothyroidism.  Former smoker.  BMI 39.  - Hospitalized 8/27 - 10/05/16 for SBO  Medications include: ASA 81 mg, atenolol-chlorthalidone, NovoLog 70/30, levothyroxine, NovoLog, albuterol, rosuvastatin  BP 135/62   Pulse 64   Temp 36.6 C   Resp 18   Ht 5\' 5"  (1.651 m)   Wt 233 lb 14.4 oz (106.1 kg)   SpO2 96%   BMI 38.92 kg/m   Preoperative labs reviewed.   - HbA1c 7.7. Glucose 400.   EKG 04/16/16: NSR. Possible LA enlargement  Cardiac cath 01/26/14:  - LM: normal - LAD: 60% after first septal at D1 takeoff.  Diffuse 50-60% mid to distal vessel disease.  D1 is a large branching vessel, 40-50% multiple discrete lesions. - CX: Normal proximally.  OM1 50% tubular disease proximally.  OM2 50% tubular disease proximally. -RCA: 100% distal occlusion with left-to-right collaterals. - Final conclusions: Concern for how tight mid LAD disease was.  Appeared moderate History of old MI involving distal RCA.  Initial medical Rx  Can consider FFR LAD and CTO of RCA in future but right now she has dyspnea and not chest pain.  Myovue Would be positive inferiorly with collaterals and not likely LAD.  LV normal indicating viability of inferior wall  - Recommendations: Medical Rx    Echo 01/20/14:  - Left ventricle: The cavity size was normal. Wall thickness wasnormal. Systolic function was normal. The estimated ejectionfraction was in the range of 60% to 65%. Wall motion was normal;there were no regional wall motion abnormalities. Dopplerparameters are  consistent with abnormal left ventricularrelaxation (grade 1 diastolic dysfunction).  Carotid duplex 08/31/13:  1.  <40% stenosis R ICA. 2.  Widely patent L CEA without evidence of restenosis or hyperplasia.  Velocities are elevated but this is likely due to a caliber change; native internal carotid artery appears small 3.  Bilateral vertebral artery antegrade  Records indicate pt was started on plavix after 01/26/14 cath but by 04/2014 admission, she had taken herself off of it. I spoke with pt by telephone. She has not seen cardiology since her cardiac cath over 3 years ago; she reports she didn't know she was supposed to f/u with cardiology.  Pt works in [employer], and is on her feet for 8 hours per day.  Denies CP or SOB, only has to take breaks at work due to back pain.  Does not exercise.   Reviewed case with Dr. Okey Dupreose. Pt will need to see cardiology prior to surgery. I notified Erie NoeVanessa in Dr. Mervyn Gayitty's office.   Rica Mastngela Breunna Nordmann, FNP-BC [facility] Phone: [phone] 02/22/2017 4:29 PM

## 2017-02-25 ENCOUNTER — Encounter (HOSPITAL_COMMUNITY): Admission: RE | Payer: Self-pay | Source: Ambulatory Visit

## 2017-02-25 ENCOUNTER — Inpatient Hospital Stay (HOSPITAL_COMMUNITY)
Admission: RE | Admit: 2017-02-25 | Payer: BLUE CROSS/BLUE SHIELD | Source: Ambulatory Visit | Admitting: Neurological Surgery

## 2017-02-25 SURGERY — ANTERIOR LUMBAR FUSION 1 LEVEL
Anesthesia: General
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
## 2017-03-26 ENCOUNTER — Telehealth: Payer: Self-pay

## 2017-03-26 DIAGNOSIS — I2510 Atherosclerotic heart disease of native coronary artery without angina pectoris: Secondary | ICD-10-CM

## 2017-03-26 DIAGNOSIS — I10 Essential (primary) hypertension: Secondary | ICD-10-CM

## 2017-03-26 DIAGNOSIS — Z01818 Encounter for other preprocedural examination: Secondary | ICD-10-CM

## 2017-03-26 NOTE — Telephone Encounter (Signed)
Called patient and left message. Called patient's daughter, unable to leave message and no answer. Called patient's son Genevie CheshireBilly ([place]), informed him to please have his mother call our office to set up testing before her appointment on 04/01/17. Patient's son will let his mother know and have her call our office.

## 2017-03-26 NOTE — Telephone Encounter (Signed)
Pt called and was given instructions for Lexiscan Myoview test. Pt stat that she understooded and wood be here at 10 am on 03-27-2017. S.Lilyanne Mcquown EMTP

## 2017-03-26 NOTE — Telephone Encounter (Signed)
F/U call:  Patient calling, states that she is returning a call

## 2017-03-26 NOTE — Telephone Encounter (Signed)
-----   Message from [name], MD sent at 03/25/2017  3:27 PM EST ----- My 3:30 and 3:45 patients on 04/01/17 are both new and listed as f/u office visits [name] needs lexiscan myovue to clear for surgery I tried To call her but got voicemail. I am assuming this based on review of chart but appointment referral doesn't have any info Please try to get myovue this week after talking with her

## 2017-03-26 NOTE — Telephone Encounter (Signed)
Called patient about scheduling myoview. Patient is scheduled for test tomorrow. Sent instructions through MyChart.

## 2017-03-27 ENCOUNTER — Ambulatory Visit (HOSPITAL_COMMUNITY): Payer: BLUE CROSS/BLUE SHIELD | Attending: Cardiovascular Disease

## 2017-03-27 DIAGNOSIS — Z01818 Encounter for other preprocedural examination: Secondary | ICD-10-CM

## 2017-03-27 DIAGNOSIS — I2510 Atherosclerotic heart disease of native coronary artery without angina pectoris: Secondary | ICD-10-CM

## 2017-03-27 DIAGNOSIS — I10 Essential (primary) hypertension: Secondary | ICD-10-CM | POA: Diagnosis not present

## 2017-03-27 LAB — MYOCARDIAL PERFUSION IMAGING
CHL CUP NUCLEAR SDS: 1
CHL CUP NUCLEAR SSS: 7
CSEPPHR: 68 {beats}/min
LHR: 0.27
LV dias vol: 103 mL (ref 46–106)
LVSYSVOL: 39 mL
Rest HR: 54 {beats}/min
SRS: 6
TID: 0.93

## 2017-03-27 MED ORDER — TECHNETIUM TC 99M TETROFOSMIN IV KIT
30.8000 | PACK | Freq: Once | INTRAVENOUS | Status: AC | PRN
  Administered 2017-03-27: 30.8 via INTRAVENOUS
  Filled 2017-03-27: qty 31

## 2017-03-27 MED ORDER — REGADENOSON 0.4 MG/5ML IV SOLN
0.4000 mg | Freq: Once | INTRAVENOUS | Status: AC
Start: 2017-03-27 — End: 2017-03-27
  Administered 2017-03-27: 0.4 mg via INTRAVENOUS

## 2017-03-27 MED ORDER — TECHNETIUM TC 99M TETROFOSMIN IV KIT
10.5000 | PACK | Freq: Once | INTRAVENOUS | Status: AC | PRN
  Administered 2017-03-27: 10.5 via INTRAVENOUS
  Filled 2017-03-27: qty 11

## 2017-04-01 ENCOUNTER — Ambulatory Visit: Payer: BLUE CROSS/BLUE SHIELD | Admitting: Cardiovascular Disease

## 2017-04-01 ENCOUNTER — Encounter: Payer: Self-pay | Admitting: Cardiovascular Disease

## 2017-04-01 VITALS — BP 136/86 | HR 66 | Ht 65.0 in | Wt 235.0 lb

## 2017-04-01 DIAGNOSIS — R0989 Other specified symptoms and signs involving the circulatory and respiratory systems: Secondary | ICD-10-CM | POA: Diagnosis not present

## 2017-04-01 DIAGNOSIS — R011 Cardiac murmur, unspecified: Secondary | ICD-10-CM | POA: Diagnosis not present

## 2017-04-01 NOTE — Patient Instructions (Signed)
Medication Instructions:  Your physician recommends that you continue on your current medications as directed. Please refer to the Current Medication list given to you today.  Labwork: NONE  Testing/Procedures: Your physician has requested that you have an echocardiogram. Echocardiography is a painless test that uses sound waves to create images of your heart. It provides your doctor with information about the size and shape of your heart and how well your heart's chambers and valves are working. This procedure takes approximately one hour. There are no restrictions for this procedure.  Your physician has requested that you have a carotid duplex. This test is an ultrasound of the carotid arteries in your neck. It looks at blood flow through these arteries that supply the brain with blood. Allow one hour for this exam. There are no restrictions or special instructions.  Follow-Up: Your physician wants you to follow-up in: 12 months with Dr. Nishan. You will receive a reminder letter in the mail two months in advance. If you don't receive a letter, please call our office to schedule the follow-up appointment.   If you need a refill on your cardiac medications before your next appointment, please call your pharmacy.    

## 2017-05-28 ENCOUNTER — Other Ambulatory Visit: Payer: Self-pay | Admitting: Family Medicine

## 2017-05-28 DIAGNOSIS — Z1231 Encounter for screening mammogram for malignant neoplasm of breast: Secondary | ICD-10-CM

## 2017-06-05 ENCOUNTER — Other Ambulatory Visit: Payer: Self-pay | Admitting: Family Medicine

## 2017-06-05 DIAGNOSIS — I739 Peripheral vascular disease, unspecified: Principal | ICD-10-CM

## 2017-06-05 DIAGNOSIS — I779 Disorder of arteries and arterioles, unspecified: Secondary | ICD-10-CM

## 2017-06-12 ENCOUNTER — Telehealth: Payer: Self-pay | Admitting: Cardiovascular Disease

## 2017-06-12 NOTE — Telephone Encounter (Signed)
° °  [facility] Pre-operative Risk Assessment    Request for surgical clearance:  1. What type of surgery is being performed? Left Wrist: left carpal tunnel release  2. When is this surgery scheduled? 06/26/2017   3. What type of clearance is required (medical clearance vs. Pharmacy clearance to hold med vs. Both)? Both  4. Are there any medications that need to be held prior to surgery and how long? Aspirin and how does pt need to hold medication prior to surgery.   5. Practice name and name of physician performing surgery? [facility]/ [employer], Dr. Linna Hoff   6. What is your office phone number [phone]   7.   What is your office fax number [phone] ATTN: Santiago Bur  8.   Anesthesia type (None, local, MAC, general) ? Local with IV Sedation    [name] 06/12/2017, 12:52 PM  _________________________________________________________________   (provider comments below)

## 2017-06-13 NOTE — Telephone Encounter (Signed)
Dr Eden Emms, this pt is planned for carpel tunnel release. Can she hold aspirin and for how long?  Please route response back to CV DIV PREOP

## 2017-06-20 ENCOUNTER — Ambulatory Visit
Admission: RE | Admit: 2017-06-20 | Discharge: 2017-06-20 | Disposition: A | Discharge status: Home / Self Care | Payer: BLUE CROSS/BLUE SHIELD | Source: Ambulatory Visit | Attending: Family Medicine | Admitting: Family Medicine

## 2017-06-20 DIAGNOSIS — Z1231 Encounter for screening mammogram for malignant neoplasm of breast: Secondary | ICD-10-CM

## 2017-06-20 NOTE — Telephone Encounter (Signed)
   Primary Cardiologist: Charlton Haws, MD  Chart reviewed as part of pre-operative protocol coverage. Patient was contacted 06/20/2017 in reference to pre-operative risk assessment for pending surgery as outlined below.  [name] was last seen on 04/01/17 by Dr. Eden Emms who provided surgical clearance.  Since that day, [name] has done well and can complete more than 4.0 METS.  OK to hold ASA for 5 days prior to procedure.  Therefore, based on ACC/AHA guidelines, the patient would be at acceptable risk for the planned procedure without further cardiovascular testing.   I will route this recommendation to the requesting party via Epic fax function and remove from pre-op pool.  Please call with questions.  Roe Rutherford Ashleah Valtierra, PA 06/20/2017, 1:23 PM

## 2017-06-20 NOTE — Telephone Encounter (Signed)
Can hold 5 days before surgery

## 2017-07-08 ENCOUNTER — Ambulatory Visit
Admission: RE | Admit: 2017-07-08 | Discharge: 2017-07-08 | Disposition: A | Discharge status: Home / Self Care | Payer: BLUE CROSS/BLUE SHIELD | Source: Ambulatory Visit | Attending: Family Medicine | Admitting: Family Medicine

## 2017-07-08 DIAGNOSIS — I739 Peripheral vascular disease, unspecified: Principal | ICD-10-CM

## 2017-07-08 DIAGNOSIS — I779 Disorder of arteries and arterioles, unspecified: Secondary | ICD-10-CM

## 2017-10-10 DIAGNOSIS — G5602 Carpal tunnel syndrome, left upper limb: Secondary | ICD-10-CM | POA: Diagnosis not present

## 2017-10-10 DIAGNOSIS — G5601 Carpal tunnel syndrome, right upper limb: Secondary | ICD-10-CM | POA: Diagnosis not present

## 2017-10-10 DIAGNOSIS — M65312 Trigger thumb, left thumb: Secondary | ICD-10-CM | POA: Diagnosis not present

## 2017-10-21 DIAGNOSIS — E1121 Type 2 diabetes mellitus with diabetic nephropathy: Secondary | ICD-10-CM | POA: Diagnosis not present

## 2017-10-21 DIAGNOSIS — I129 Hypertensive chronic kidney disease with stage 1 through stage 4 chronic kidney disease, or unspecified chronic kidney disease: Secondary | ICD-10-CM | POA: Diagnosis not present

## 2017-10-21 DIAGNOSIS — G4700 Insomnia, unspecified: Secondary | ICD-10-CM | POA: Diagnosis not present

## 2017-10-21 DIAGNOSIS — Z794 Long term (current) use of insulin: Secondary | ICD-10-CM | POA: Diagnosis not present

## 2017-10-21 DIAGNOSIS — N182 Chronic kidney disease, stage 2 (mild): Secondary | ICD-10-CM | POA: Diagnosis not present

## 2017-11-07 DIAGNOSIS — G5601 Carpal tunnel syndrome, right upper limb: Secondary | ICD-10-CM | POA: Diagnosis not present

## 2017-11-07 DIAGNOSIS — G5602 Carpal tunnel syndrome, left upper limb: Secondary | ICD-10-CM | POA: Diagnosis not present

## 2017-11-07 DIAGNOSIS — M65312 Trigger thumb, left thumb: Secondary | ICD-10-CM | POA: Diagnosis not present

## 2017-11-14 ENCOUNTER — Ambulatory Visit: Payer: BLUE CROSS/BLUE SHIELD | Admitting: Skilled Nursing Facility1

## 2017-11-26 DIAGNOSIS — S62357D Nondisplaced fracture of shaft of fifth metacarpal bone, left hand, subsequent encounter for fracture with routine healing: Secondary | ICD-10-CM | POA: Diagnosis not present

## 2017-11-26 DIAGNOSIS — M79642 Pain in left hand: Secondary | ICD-10-CM | POA: Diagnosis not present

## 2017-12-05 ENCOUNTER — Encounter: Payer: Medicare Other | Attending: Family Medicine | Admitting: Registered"

## 2017-12-05 ENCOUNTER — Encounter: Payer: Self-pay | Admitting: Registered"

## 2017-12-05 DIAGNOSIS — E1121 Type 2 diabetes mellitus with diabetic nephropathy: Secondary | ICD-10-CM | POA: Insufficient documentation

## 2017-12-05 DIAGNOSIS — E119 Type 2 diabetes mellitus without complications: Secondary | ICD-10-CM

## 2017-12-05 NOTE — Patient Instructions (Signed)
-   Aim to increase physical activity of 20 minutes a day at least 3 days a week.   - Aim to increase fiber intake with non-starchy vegetables to lunch and dinner.

## 2017-12-05 NOTE — Progress Notes (Signed)
Diabetes Self-Management Education  Visit Type:  First/Initial  Appt. Start Time: 11:07 Appt. End Time: 12:07  12/05/2017  [name], identified by name and date of birth, is a 62 y.o. female with a diagnosis of Diabetes: Type 2.   ASSESSMENT  Pt expectations: none stated  Pt states she does not want to go on dialysis. Pt states she had a lot of family members on dialysis. Pt states she is trying to take care of her kidneys.   Pt states she feels symptoms of hypoglycemia when BS is < 92.Pt states she notices hypoglycemia most often in the morning after taking insulin and eating breakfast. Pt states she cannot tell when her blood sugar is high.   Next visit: carbohydrate counting, chronic complications, etc.   There were no vitals taken for this visit. There is no height or weight on file to calculate BMI.   Diabetes Self-Management Education - 12/05/17 1114      Health Coping   How would you rate your overall health?  Fair      Psychosocial Assessment   Patient Belief/Attitude about Diabetes  (S) Other (comment)   Pt states she hates it   Self-care barriers  None    Self-management support  Family;Doctor's office    Special Needs  None    Preferred Learning Style  No preference indicated    Learning Readiness  Contemplating      Complications   Last HgB A1C per patient/outside source  10 %    How often do you check your blood sugar?  3-4 times/day    Fasting Blood glucose range (mg/dL)  161-096   045   Postprandial Blood glucose range (mg/dL)  40-981;191-478   295-621   Number of hypoglycemic episodes per month  7    Can you tell when your blood sugar is low?  Yes    What do you do if your blood sugar is low?  eats candy or snack cake    Number of hyperglycemic episodes per week  0   Pt states she is unable to tell when blood sugar is high   Have you had a dilated eye exam in the past 12 months?  No    Have you had a dental exam in the past 12 months?  No    Are you checking your feet?  Yes    How many days per week are you checking your feet?  7      Dietary Intake   Breakfast  coffee + 2 sausage patties + 2 eggs    Snack (morning)  none    Lunch  leftovers (chicken + fried potatoes w/ onions)    Snack (afternoon)  none    Dinner  beef neck bones + fried potatoes with onions or pork chops    Snack (evening)  none    Beverage(s)  water, ginger ale (sometimes), coffee      Exercise   Exercise Type  ADL's    How many days per week to you exercise?  0    How many minutes per day do you exercise?  0    Total minutes per week of exercise  0      Patient Education   Previous Diabetes Education  No    Disease state   Definition of diabetes, type 1 and 2, and the diagnosis of diabetes;Factors that contribute to the development of diabetes    Nutrition management   Role of diet in  the treatment of diabetes and the relationship between the three main macronutrients and blood glucose level;Reviewed blood glucose goals for pre and post meals and how to evaluate the patients' food intake on their blood glucose level.    Physical activity and exercise   Role of exercise on diabetes management, blood pressure control and cardiac health.    Monitoring  Purpose and frequency of SMBG.;Taught/discussed recording of test results and interpretation of SMBG.;Identified appropriate SMBG and/or A1C goals.;Interpreting lab values - A1C, lipid, urine microalbumina.    Acute complications  Taught treatment of hypoglycemia - the 15 rule.;Discussed and identified patients' treatment of hyperglycemia.    Psychosocial adjustment  Role of stress on diabetes      Individualized Goals (developed by patient)   Nutrition  Follow meal plan discussed;General guidelines for healthy choices and portions discussed    Physical Activity  Exercise 3-5 times per week    Medications  take my medication as prescribed    Monitoring   test my blood glucose as discussed;test blood glucose  pre and post meals as discussed    Reducing Risk  treat hypoglycemia with 15 grams of carbs if blood glucose less than 70mg /dL    Health Coping  Not Applicable      Post-Education Assessment   Patient understands the diabetes disease and treatment process.  Demonstrates understanding / competency    Patient understands incorporating nutritional management into lifestyle.  Demonstrates understanding / competency    Patient undertands incorporating physical activity into lifestyle.  Demonstrates understanding / competency    Patient understands using medications safely.  Demonstrates understanding / competency    Patient understands monitoring blood glucose, interpreting and using results  Needs Review    Patient understands prevention, detection, and treatment of acute complications.  Demonstrates understanding / competency    Patient understands prevention, detection, and treatment of chronic complications.  Needs Instruction    Patient understands how to develop strategies to address psychosocial issues.  Needs Instruction    Patient understands how to develop strategies to promote health/change behavior.  Needs Instruction      Outcomes   Program Status  Not Completed       Learning Objective:  Patient will have a greater understanding of diabetes self-management. Patient education plan is to attend individual and/or group sessions per assessed needs and concerns.   Plan:   Patient Instructions  - Aim to increase physical activity of 20 minutes a day at least 3 days a week.   - Aim to increase fiber intake with non-starchy vegetables to lunch and dinner.     Expected Outcomes:  Demonstrated interest in learning. Expect positive outcomes  Education material provided: ADA Diabetes: Your Take Control Guide  If problems or questions, patient to contact team via:  Phone and Email  Future DSME appointment: - 2 wks

## 2017-12-16 ENCOUNTER — Encounter: Payer: Medicare HMO | Attending: Family Medicine | Admitting: Registered"

## 2017-12-16 ENCOUNTER — Encounter: Payer: Self-pay | Admitting: Registered"

## 2017-12-16 DIAGNOSIS — E1121 Type 2 diabetes mellitus with diabetic nephropathy: Secondary | ICD-10-CM | POA: Diagnosis not present

## 2017-12-16 DIAGNOSIS — E119 Type 2 diabetes mellitus without complications: Secondary | ICD-10-CM

## 2017-12-16 NOTE — Progress Notes (Signed)
Diabetes Self-Management Education  Visit Type:  Follow-up  Appt. Start Time: 10:00 Appt. End Time: 10:56  12/16/2017  [name], identified by name and date of birth, is a 62 y.o. female with a diagnosis of Diabetes: Type 2.   ASSESSMENT  Pt states she has been implementing changes and adding in more vegetables into her meals. Pt states she her mom is telling her she is eating too many vegetables and she takes it as a grain of salt. Pt states she continues to increase vegetable intake. Pt states she  Experienced hypoglycemia once where  BS dropped to 64, ate 2 coconut bars, and checked it a few minutes later, BS had increased to 100. Pt states she took a nap afterwards. Pt states her her son eats all of her sweets that she keeps at home. Pt states she may be able to increase physical activity by walking to the mailbox.   Pt states she tried diet ginger ale and does not like it. Pt states she has been doing some physical activity with arm exercises, not routine. Pt states she quit smoking, replaced with food, and gained weight. Pt states she then started diets and lost weight temporarily. Pt states she began taking some medications that increased weight gain which she has not been able to lose since then.   Next visit: carbohydrate counting  There were no vitals taken for this visit. There is no height or weight on file to calculate BMI.   Diabetes Self-Management Education - 12/16/17 0956      Complications   How often do you check your blood sugar?  3-4 times/day    Fasting Blood glucose range (mg/dL)  161-096    Are you checking your feet?  Yes    How many days per week are you checking your feet?  7      Dietary Intake   Breakfast  2 sausage patties + eggs + toast + coffee    Snack (morning)  none    Lunch  fried chicken + fried potatoes + onions + bell peppers    Snack (afternoon)  none    Dinner  roast + carrots +  potatoes + onions    Snack (evening)  none    Beverage(s)  coffee, water, diet ginger ale      Exercise   Exercise Type  ADL's    How many days per week to you exercise?  0    How many minutes per day do you exercise?  0    Total minutes per week of exercise  0      Patient Education   Previous Diabetes Education  Yes (please comment)      Subsequent Visit   Since your last visit have you continued or begun to take your medications as prescribed?  Yes  (Pended)     Since your last visit have you had your blood pressure checked?  Yes  (Pended)     Since your last visit, are you checking your blood glucose at least once a day?  Yes  (Pended)        Learning Objective:  Patient will have a greater understanding of diabetes self-management. Patient education plan is to attend individual and/or group sessions per assessed needs and concerns.   Plan:   Patient Instructions  - Aim to increase physical activity to at least 15 minutes, 4 days/week.   - Continue increasing vegetable intake with meals. Make 1/2 plate vegetables.   -  Continue checking blood sugars 3-4 times a day.   - Remember 15-15 rule with low blood sugars. If blood sugar is less than 70:  1. Consume 15 grams of carbohydrates  2. Check blood sugar 15 minutes later  3. If greater 70, eat meal or snack. If still less than 70, repeat steps 1 and 2.     Expected Outcomes:     Education material provided: none  If problems or questions, patient to contact team via:  Phone and Email  Future DSME appointment: -  1 month

## 2017-12-16 NOTE — Patient Instructions (Addendum)
-   Aim to increase physical activity to at least 15 minutes, 4 days/week.   - Continue increasing vegetable intake with meals. Make 1/2 plate vegetables.   - Continue checking blood sugars 3-4 times a day.   - Remember 15-15 rule with low blood sugars. If blood sugar is less than 70:  1. Consume 15 grams of carbohydrates  2. Check blood sugar 15 minutes later  3. If greater 70, eat meal or snack. If still less than 70, repeat steps 1 and 2.

## 2017-12-19 DIAGNOSIS — S62357D Nondisplaced fracture of shaft of fifth metacarpal bone, left hand, subsequent encounter for fracture with routine healing: Secondary | ICD-10-CM | POA: Diagnosis not present

## 2017-12-19 DIAGNOSIS — G5602 Carpal tunnel syndrome, left upper limb: Secondary | ICD-10-CM | POA: Diagnosis not present

## 2018-01-14 DIAGNOSIS — N182 Chronic kidney disease, stage 2 (mild): Secondary | ICD-10-CM | POA: Diagnosis not present

## 2018-01-14 DIAGNOSIS — Z8639 Personal history of other endocrine, nutritional and metabolic disease: Secondary | ICD-10-CM | POA: Diagnosis not present

## 2018-01-14 DIAGNOSIS — Z794 Long term (current) use of insulin: Secondary | ICD-10-CM | POA: Diagnosis not present

## 2018-01-14 DIAGNOSIS — Z6841 Body Mass Index (BMI) 40.0 and over, adult: Secondary | ICD-10-CM | POA: Diagnosis not present

## 2018-01-14 DIAGNOSIS — E1122 Type 2 diabetes mellitus with diabetic chronic kidney disease: Secondary | ICD-10-CM | POA: Diagnosis not present

## 2018-01-14 DIAGNOSIS — E1165 Type 2 diabetes mellitus with hyperglycemia: Secondary | ICD-10-CM | POA: Diagnosis not present

## 2018-01-14 DIAGNOSIS — G4700 Insomnia, unspecified: Secondary | ICD-10-CM | POA: Diagnosis not present

## 2018-01-15 ENCOUNTER — Ambulatory Visit: Payer: Medicare HMO | Admitting: Registered"

## 2018-01-20 DIAGNOSIS — E119 Type 2 diabetes mellitus without complications: Secondary | ICD-10-CM | POA: Diagnosis not present

## 2018-03-17 ENCOUNTER — Other Ambulatory Visit (HOSPITAL_COMMUNITY): Payer: Self-pay

## 2018-03-21 ENCOUNTER — Encounter: Payer: Self-pay | Admitting: Cardiovascular Disease

## 2018-03-24 ENCOUNTER — Inpatient Hospital Stay (HOSPITAL_COMMUNITY): Admission: RE | Admit: 2018-03-24 | Payer: Medicare HMO | Source: Ambulatory Visit
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
## 2018-04-01 ENCOUNTER — Telehealth: Payer: Self-pay

## 2018-04-01 NOTE — Telephone Encounter (Signed)
New message     Just an FYI. We have made several attempts to contact this patient including sending a letter to schedule or reschedule their echocardiogram. We will be removing the patient from the echo WQ.  2.24.2020 @ 2:20pm mail box is full [name]  2.17.2020 @ 3:45pm mail box is full - [name] 2.14.20 mail reminder letter  patient NO show appt on 2/10- [name]  Thank you

## 2018-04-03 ENCOUNTER — Ambulatory Visit: Payer: Medicare HMO | Admitting: Cardiovascular Disease
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
## 2018-04-11 ENCOUNTER — Ambulatory Visit: Payer: Medicare HMO | Admitting: Cardiovascular Disease

## 2018-04-14 DIAGNOSIS — E1140 Type 2 diabetes mellitus with diabetic neuropathy, unspecified: Secondary | ICD-10-CM | POA: Diagnosis not present

## 2018-04-14 DIAGNOSIS — J019 Acute sinusitis, unspecified: Secondary | ICD-10-CM | POA: Diagnosis not present

## 2018-04-14 DIAGNOSIS — E1165 Type 2 diabetes mellitus with hyperglycemia: Secondary | ICD-10-CM | POA: Diagnosis not present

## 2018-05-06 ENCOUNTER — Telehealth: Payer: Self-pay

## 2018-05-06 NOTE — Telephone Encounter (Signed)
   Primary Cardiologist:  Charlton Haws, MD   Patient contacted.  History reviewed.  No symptoms to suggest any unstable cardiac conditions.  Based on discussion, with current pandemic situation, we will be postponing this appointment for [name] with a plan for f/u in 6 months or sooner if feasible/necessary.  If symptoms change, she has been instructed to contact our office.   Routing to C19 CANCEL pool for tracking (P CV DIV CV19 CANCEL - reason for visit "other.") and assigning priority 3 = >12 wks).   Michaelle Copas, CMA  05/06/2018 3:13 PM

## 2018-05-08 NOTE — Telephone Encounter (Signed)
Patient refused virtual visit, patient stated she wants to be seen in person in about 6 months. Put in recall for 6 month f/u.     Virtual Visit Pre-Appointment Phone Call  Steps For Call:  1. Confirm consent - "In the setting of the current Covid19 crisis, you are scheduled for a (phone or video) visit with your provider on (date) at (time).  Just as we do with many in-office visits, in order for you to participate in this visit, we must obtain consent.  If you'd like, I can send this to your mychart (if signed up) or email for you to review.  Otherwise, I can obtain your verbal consent now.  All virtual visits are billed to your insurance company just like a normal visit would be.  By agreeing to a virtual visit, we'd like you to understand that the technology does not allow for your provider to perform an examination, and thus may limit your provider's ability to fully assess your condition.  Finally, though the technology is pretty good, we cannot assure that it will always work on either your or our end, and in the setting of a video visit, we may have to convert it to a phone-only visit.  In either situation, we cannot ensure that we have a secure connection.  Are you willing to proceed?"  2. Give patient instructions for WebEx download to smartphone as below if video visit  3. Advise patient to be prepared with any vital sign or heart rhythm information, their current medicines, and a piece of paper and pen handy for any instructions they may receive the day of their visit  4. Inform patient they will receive a phone call 15 minutes prior to their appointment time (may be from unknown caller ID) so they should be prepared to answer  5. Confirm that appointment type is correct in Epic appointment notes (video vs telephone)    TELEPHONE CALL NOTE  [name] has been deemed a candidate for a follow-up tele-health visit to limit community exposure during the Covid-19 pandemic. I spoke  with the patient via phone to ensure availability of phone/video source, confirm preferred email & phone number, and discuss instructions and expectations.  I reminded [name] to be prepared with any vital sign and/or heart rhythm information that could potentially be obtained via home monitoring, at the time of her visit. I reminded [name] to expect a phone call at the time of her visit if her visit.  Did the patient verbally acknowledge consent to treatment? No, patient refused.  Cindi Carbon [place], RN 05/08/2018 3:09 PM   DOWNLOADING THE WEBEX SOFTWARE TO SMARTPHONE  - If Apple, go to [employer] and type in WebEx in the search bar. Download [employer], the blue/green circle. The app is free but as with any other app downloads, their phone may require them to verify saved payment information or Apple password. The patient does NOT have to create an account.  - If Android, ask patient to go to [employer] and type in WebEx in the search bar. Download [employer], the blue/green circle. The app is free but as with any other app downloads, their phone may require them to verify saved payment information or Android password. The patient does NOT have to create an account.   CONSENT FOR TELE-HEALTH VISIT - PLEASE REVIEW  I hereby voluntarily request, consent and authorize [facility] and its employed or contracted physicians, physician  assistants, nurse practitioners or other licensed health care professionals (the Practitioner), to provide me with telemedicine health care services (the "Services") as deemed necessary by the treating Practitioner. I acknowledge and consent to receive the Services by the Practitioner via telemedicine. I understand that the telemedicine visit will involve communicating with the Practitioner through live audiovisual communication technology and the disclosure of certain medical information by electronic transmission. I acknowledge  that I have been given the opportunity to request an in-person assessment or other available alternative prior to the telemedicine visit and am voluntarily participating in the telemedicine visit.  I understand that I have the right to withhold or withdraw my consent to the use of telemedicine in the course of my care at any time, without affecting my right to future care or treatment, and that the Practitioner or I may terminate the telemedicine visit at any time. I understand that I have the right to inspect all information obtained and/or recorded in the course of the telemedicine visit and may receive copies of available information for a reasonable fee.  I understand that some of the potential risks of receiving the Services via telemedicine include:  Marland Kitchen Delay or interruption in medical evaluation due to technological equipment failure or disruption; . Information transmitted may not be sufficient (e.g. poor resolution of images) to allow for appropriate medical decision making by the Practitioner; and/or  . In rare instances, security protocols could fail, causing a breach of personal health information.  Furthermore, I acknowledge that it is my responsibility to provide information about my medical history, conditions and care that is complete and accurate to the best of my ability. I acknowledge that Practitioner's advice, recommendations, and/or decision may be based on factors not within their control, such as incomplete or inaccurate data provided by me or distortions of diagnostic images or specimens that may result from electronic transmissions. I understand that the practice of medicine is not an exact science and that Practitioner makes no warranties or guarantees regarding treatment outcomes. I acknowledge that I will receive a copy of this consent concurrently upon execution via email to the email address I last provided but may also request a printed copy by calling the office of [facility].    I understand that my insurance will be billed for this visit.   I have read or had this consent read to me. . I understand the contents of this consent, which adequately explains the benefits and risks of the Services being provided via telemedicine.  . I have been provided ample opportunity to ask questions regarding this consent and the Services and have had my questions answered to my satisfaction. . I give my informed consent for the services to be provided through the use of telemedicine in my medical care  By participating in this telemedicine visit I agree to the above.

## 2018-05-15 ENCOUNTER — Ambulatory Visit: Payer: Medicare HMO | Admitting: Cardiovascular Disease

## 2018-07-14 DIAGNOSIS — I10 Essential (primary) hypertension: Secondary | ICD-10-CM | POA: Diagnosis not present

## 2018-07-14 DIAGNOSIS — Z0000 Encounter for general adult medical examination without abnormal findings: Secondary | ICD-10-CM | POA: Diagnosis not present

## 2018-07-14 DIAGNOSIS — E1121 Type 2 diabetes mellitus with diabetic nephropathy: Secondary | ICD-10-CM | POA: Diagnosis not present

## 2018-07-14 DIAGNOSIS — N182 Chronic kidney disease, stage 2 (mild): Secondary | ICD-10-CM | POA: Diagnosis not present

## 2018-07-14 DIAGNOSIS — E039 Hypothyroidism, unspecified: Secondary | ICD-10-CM | POA: Diagnosis not present

## 2018-07-14 DIAGNOSIS — Z794 Long term (current) use of insulin: Secondary | ICD-10-CM | POA: Diagnosis not present

## 2018-07-14 DIAGNOSIS — E7800 Pure hypercholesterolemia, unspecified: Secondary | ICD-10-CM | POA: Diagnosis not present

## 2018-07-14 DIAGNOSIS — E559 Vitamin D deficiency, unspecified: Secondary | ICD-10-CM | POA: Diagnosis not present

## 2018-07-14 DIAGNOSIS — Z79899 Other long term (current) drug therapy: Secondary | ICD-10-CM | POA: Diagnosis not present

## 2018-08-11 DIAGNOSIS — E876 Hypokalemia: Secondary | ICD-10-CM | POA: Diagnosis not present

## 2018-08-11 DIAGNOSIS — E039 Hypothyroidism, unspecified: Secondary | ICD-10-CM | POA: Diagnosis not present

## 2018-08-11 DIAGNOSIS — Z1211 Encounter for screening for malignant neoplasm of colon: Secondary | ICD-10-CM | POA: Diagnosis not present

## 2018-08-14 DIAGNOSIS — E876 Hypokalemia: Secondary | ICD-10-CM | POA: Diagnosis not present

## 2018-08-14 DIAGNOSIS — Z6841 Body Mass Index (BMI) 40.0 and over, adult: Secondary | ICD-10-CM | POA: Diagnosis not present

## 2018-08-14 DIAGNOSIS — E039 Hypothyroidism, unspecified: Secondary | ICD-10-CM | POA: Diagnosis not present

## 2018-08-14 DIAGNOSIS — E1165 Type 2 diabetes mellitus with hyperglycemia: Secondary | ICD-10-CM | POA: Diagnosis not present

## 2018-08-14 DIAGNOSIS — Z794 Long term (current) use of insulin: Secondary | ICD-10-CM | POA: Diagnosis not present

## 2018-08-14 DIAGNOSIS — M25569 Pain in unspecified knee: Secondary | ICD-10-CM | POA: Diagnosis not present

## 2018-09-03 ENCOUNTER — Other Ambulatory Visit: Payer: Self-pay | Admitting: Family Medicine

## 2018-09-03 DIAGNOSIS — Z1231 Encounter for screening mammogram for malignant neoplasm of breast: Secondary | ICD-10-CM

## 2018-09-11 DIAGNOSIS — M170 Bilateral primary osteoarthritis of knee: Secondary | ICD-10-CM | POA: Diagnosis not present

## 2018-09-11 DIAGNOSIS — M25562 Pain in left knee: Secondary | ICD-10-CM | POA: Diagnosis not present

## 2018-09-11 DIAGNOSIS — M25561 Pain in right knee: Secondary | ICD-10-CM | POA: Diagnosis not present

## 2018-09-16 DIAGNOSIS — G5602 Carpal tunnel syndrome, left upper limb: Secondary | ICD-10-CM | POA: Diagnosis not present

## 2018-09-16 DIAGNOSIS — G5603 Carpal tunnel syndrome, bilateral upper limbs: Secondary | ICD-10-CM | POA: Diagnosis not present

## 2018-09-16 DIAGNOSIS — M65332 Trigger finger, left middle finger: Secondary | ICD-10-CM | POA: Diagnosis not present

## 2018-09-16 DIAGNOSIS — G5601 Carpal tunnel syndrome, right upper limb: Secondary | ICD-10-CM | POA: Diagnosis not present

## 2018-09-22 DIAGNOSIS — M79651 Pain in right thigh: Secondary | ICD-10-CM | POA: Diagnosis not present

## 2018-09-22 DIAGNOSIS — M79604 Pain in right leg: Secondary | ICD-10-CM | POA: Diagnosis not present

## 2018-09-22 DIAGNOSIS — M79605 Pain in left leg: Secondary | ICD-10-CM | POA: Diagnosis not present

## 2018-09-22 DIAGNOSIS — M79652 Pain in left thigh: Secondary | ICD-10-CM | POA: Diagnosis not present

## 2018-09-25 DIAGNOSIS — M170 Bilateral primary osteoarthritis of knee: Secondary | ICD-10-CM | POA: Diagnosis not present

## 2018-09-25 DIAGNOSIS — M1711 Unilateral primary osteoarthritis, right knee: Secondary | ICD-10-CM | POA: Diagnosis not present

## 2018-09-25 DIAGNOSIS — E1165 Type 2 diabetes mellitus with hyperglycemia: Secondary | ICD-10-CM | POA: Diagnosis not present

## 2018-09-25 DIAGNOSIS — Z794 Long term (current) use of insulin: Secondary | ICD-10-CM | POA: Diagnosis not present

## 2018-09-25 DIAGNOSIS — E876 Hypokalemia: Secondary | ICD-10-CM | POA: Diagnosis not present

## 2018-09-25 DIAGNOSIS — E039 Hypothyroidism, unspecified: Secondary | ICD-10-CM | POA: Diagnosis not present

## 2018-11-10 ENCOUNTER — Ambulatory Visit: Payer: Medicare HMO

## 2018-11-21 DIAGNOSIS — Z794 Long term (current) use of insulin: Secondary | ICD-10-CM | POA: Diagnosis not present

## 2018-11-21 DIAGNOSIS — E1121 Type 2 diabetes mellitus with diabetic nephropathy: Secondary | ICD-10-CM | POA: Diagnosis not present

## 2018-11-21 DIAGNOSIS — E039 Hypothyroidism, unspecified: Secondary | ICD-10-CM | POA: Diagnosis not present

## 2018-11-21 DIAGNOSIS — E876 Hypokalemia: Secondary | ICD-10-CM | POA: Diagnosis not present

## 2018-11-21 DIAGNOSIS — Z6841 Body Mass Index (BMI) 40.0 and over, adult: Secondary | ICD-10-CM | POA: Diagnosis not present

## 2018-11-21 DIAGNOSIS — Z23 Encounter for immunization: Secondary | ICD-10-CM | POA: Diagnosis not present

## 2018-11-24 ENCOUNTER — Ambulatory Visit: Payer: Medicare HMO

## 2018-11-26 DIAGNOSIS — I8311 Varicose veins of right lower extremity with inflammation: Secondary | ICD-10-CM | POA: Diagnosis not present

## 2018-11-28 DIAGNOSIS — E876 Hypokalemia: Secondary | ICD-10-CM | POA: Diagnosis not present

## 2018-11-29 DIAGNOSIS — E119 Type 2 diabetes mellitus without complications: Secondary | ICD-10-CM | POA: Diagnosis not present

## 2018-11-29 DIAGNOSIS — Z0100 Encounter for examination of eyes and vision without abnormal findings: Secondary | ICD-10-CM | POA: Diagnosis not present

## 2018-12-08 DIAGNOSIS — L309 Dermatitis, unspecified: Secondary | ICD-10-CM | POA: Diagnosis not present

## 2018-12-11 DIAGNOSIS — I7381 Erythromelalgia: Secondary | ICD-10-CM | POA: Diagnosis not present

## 2018-12-11 DIAGNOSIS — I83812 Varicose veins of left lower extremities with pain: Secondary | ICD-10-CM | POA: Diagnosis not present

## 2019-01-13 ENCOUNTER — Other Ambulatory Visit: Payer: Self-pay

## 2019-01-13 ENCOUNTER — Ambulatory Visit
Admission: RE | Admit: 2019-01-13 | Discharge: 2019-01-13 | Disposition: A | Discharge status: Home / Self Care | Payer: Medicare HMO | Source: Ambulatory Visit | Attending: Family Medicine | Admitting: Family Medicine

## 2019-01-13 DIAGNOSIS — Z1231 Encounter for screening mammogram for malignant neoplasm of breast: Secondary | ICD-10-CM | POA: Diagnosis not present

## 2019-01-14 ENCOUNTER — Other Ambulatory Visit: Payer: Self-pay | Admitting: Family Medicine

## 2019-01-14 DIAGNOSIS — R928 Other abnormal and inconclusive findings on diagnostic imaging of breast: Secondary | ICD-10-CM

## 2019-01-16 ENCOUNTER — Other Ambulatory Visit: Payer: Self-pay

## 2019-01-16 ENCOUNTER — Ambulatory Visit
Admission: RE | Admit: 2019-01-16 | Discharge: 2019-01-16 | Disposition: A | Discharge status: Home / Self Care | Payer: Medicare HMO | Source: Ambulatory Visit | Attending: Family Medicine | Admitting: Family Medicine

## 2019-01-16 ENCOUNTER — Other Ambulatory Visit: Payer: Self-pay | Admitting: Family Medicine

## 2019-01-16 DIAGNOSIS — R921 Mammographic calcification found on diagnostic imaging of breast: Secondary | ICD-10-CM

## 2019-01-16 DIAGNOSIS — R928 Other abnormal and inconclusive findings on diagnostic imaging of breast: Secondary | ICD-10-CM

## 2019-01-17 IMAGING — DX DG FOOT COMPLETE 3+V*L*
4 series · 4 of 4 positions shown · non-contrast
Comparison: None.

CLINICAL DATA: Glass in the left foot.

EXAM:
LEFT FOOT - COMPLETE 3+ VIEW

[foot ap (1 of 2)]
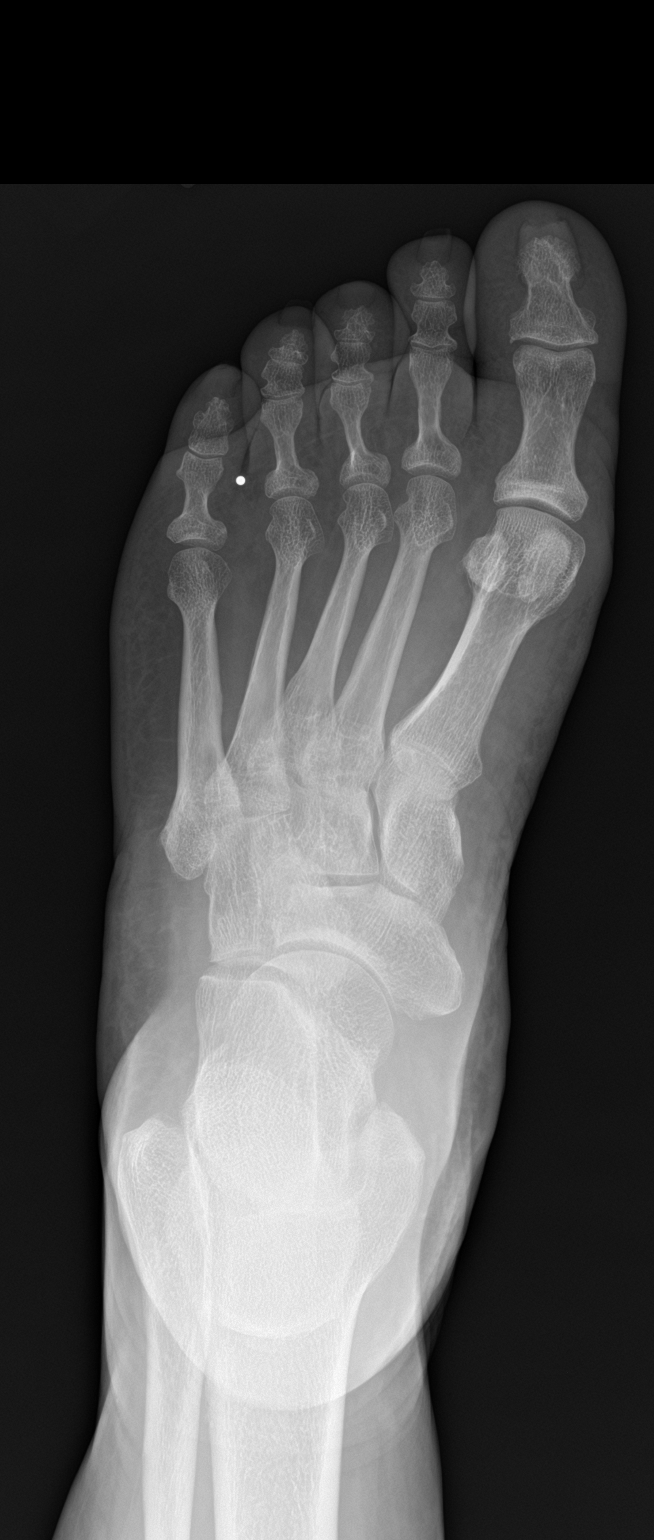

[foot obl]
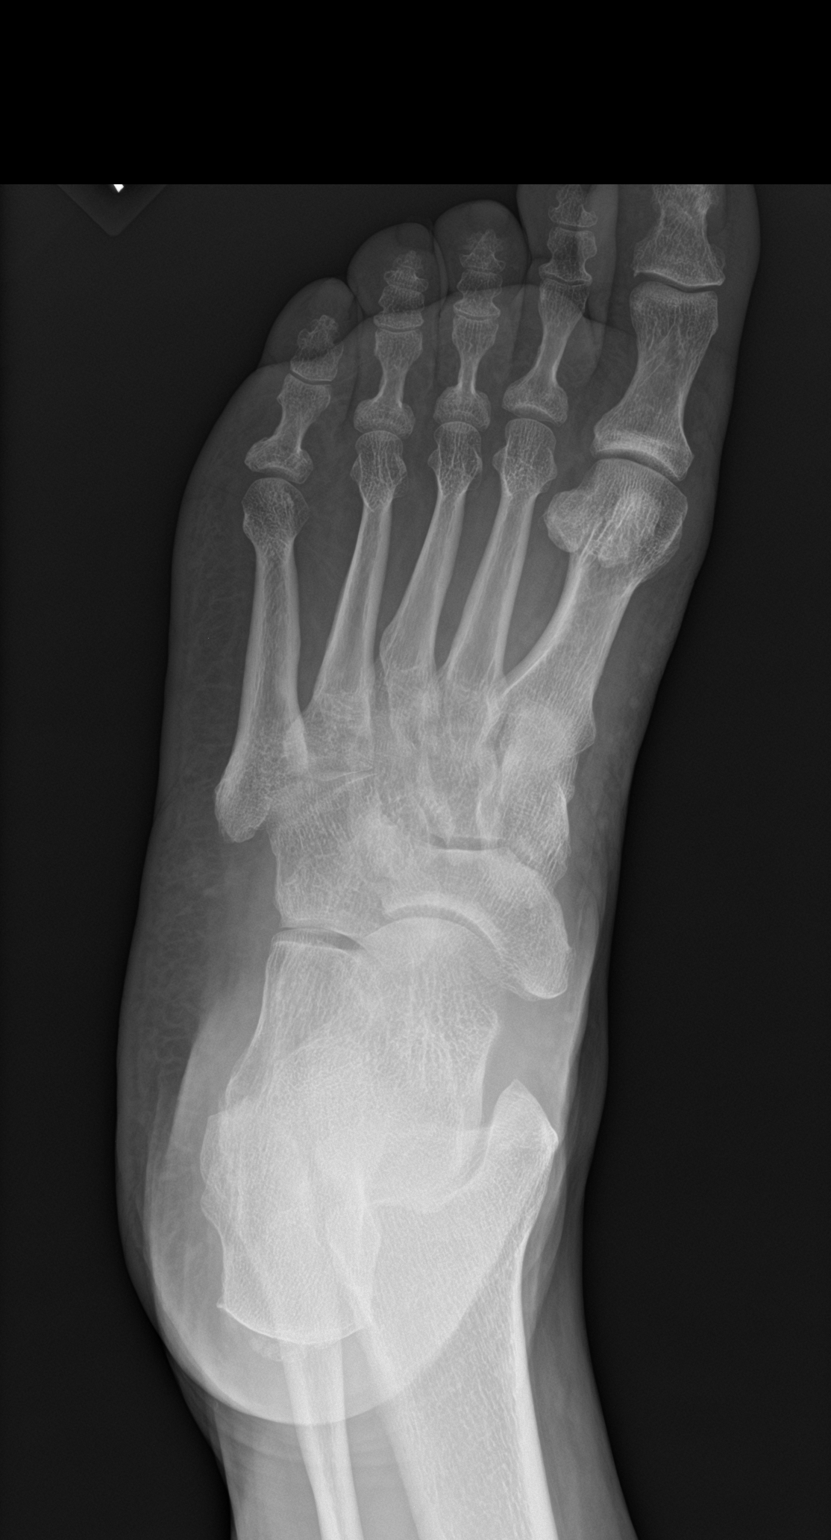

[foot lat]
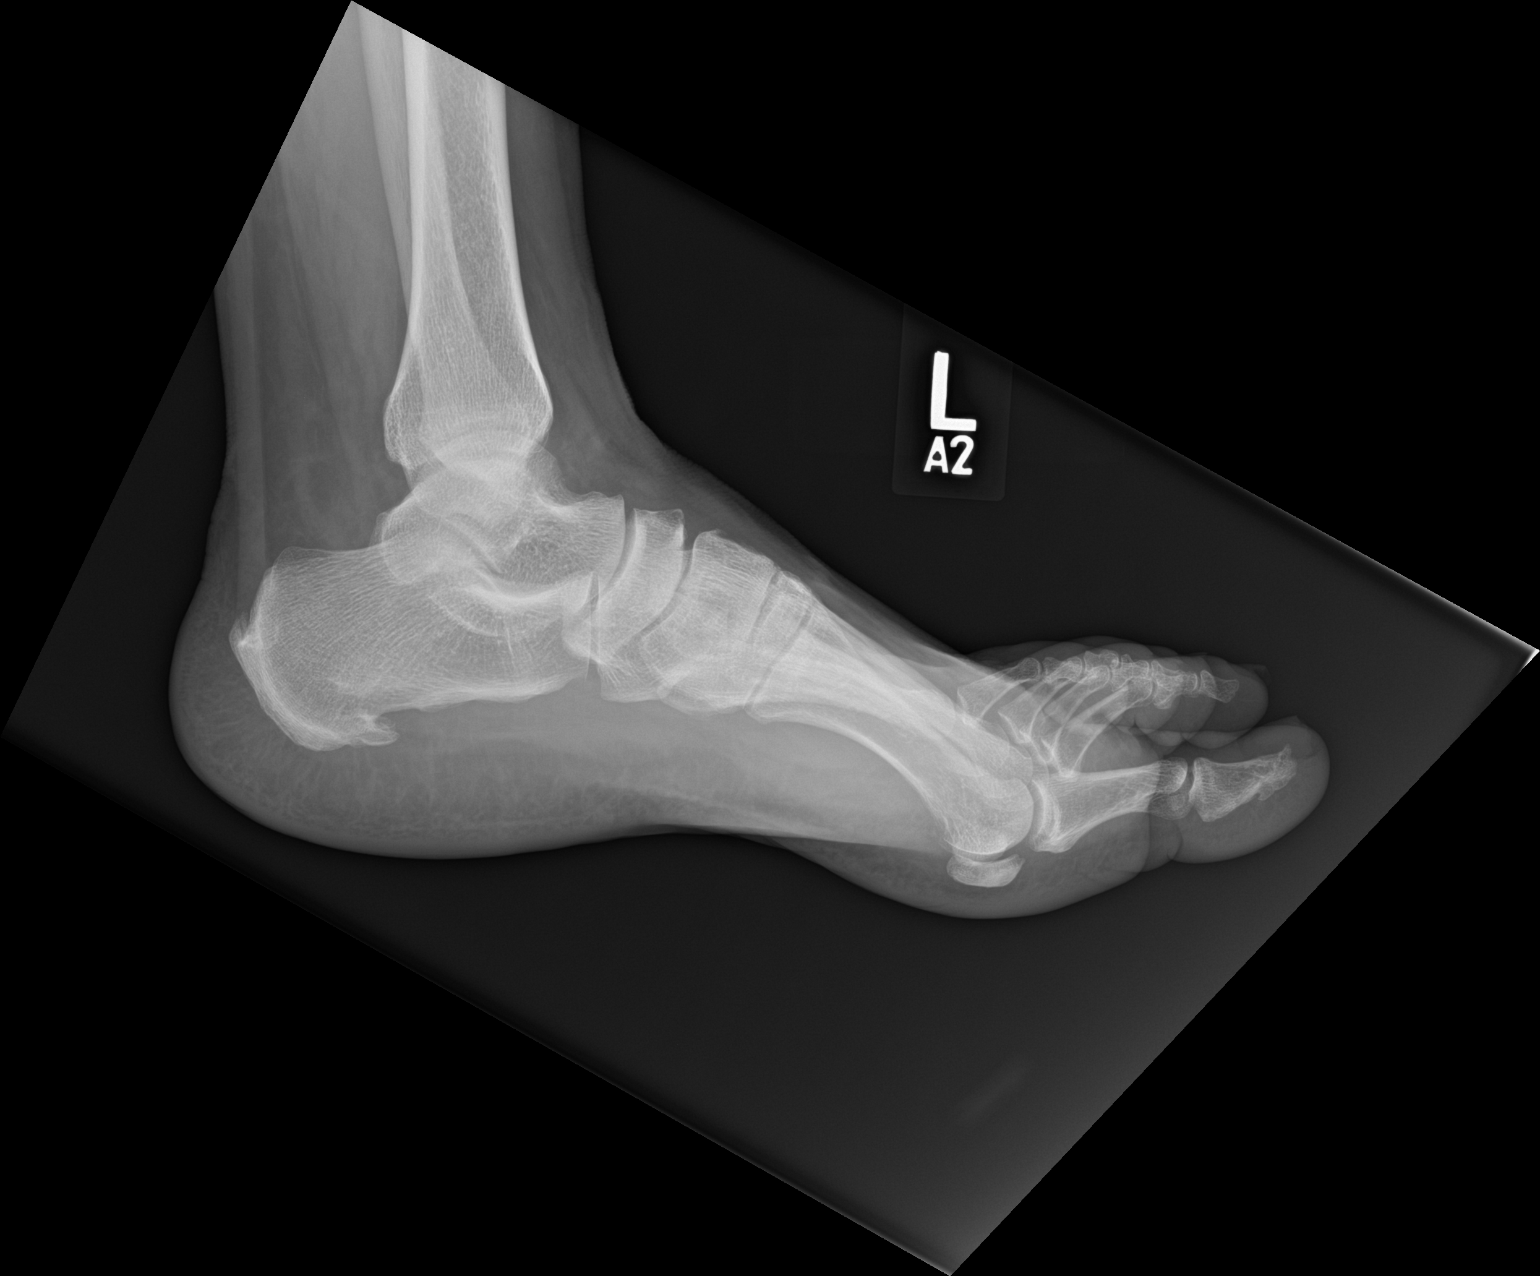

[foot ap (2 of 2)]
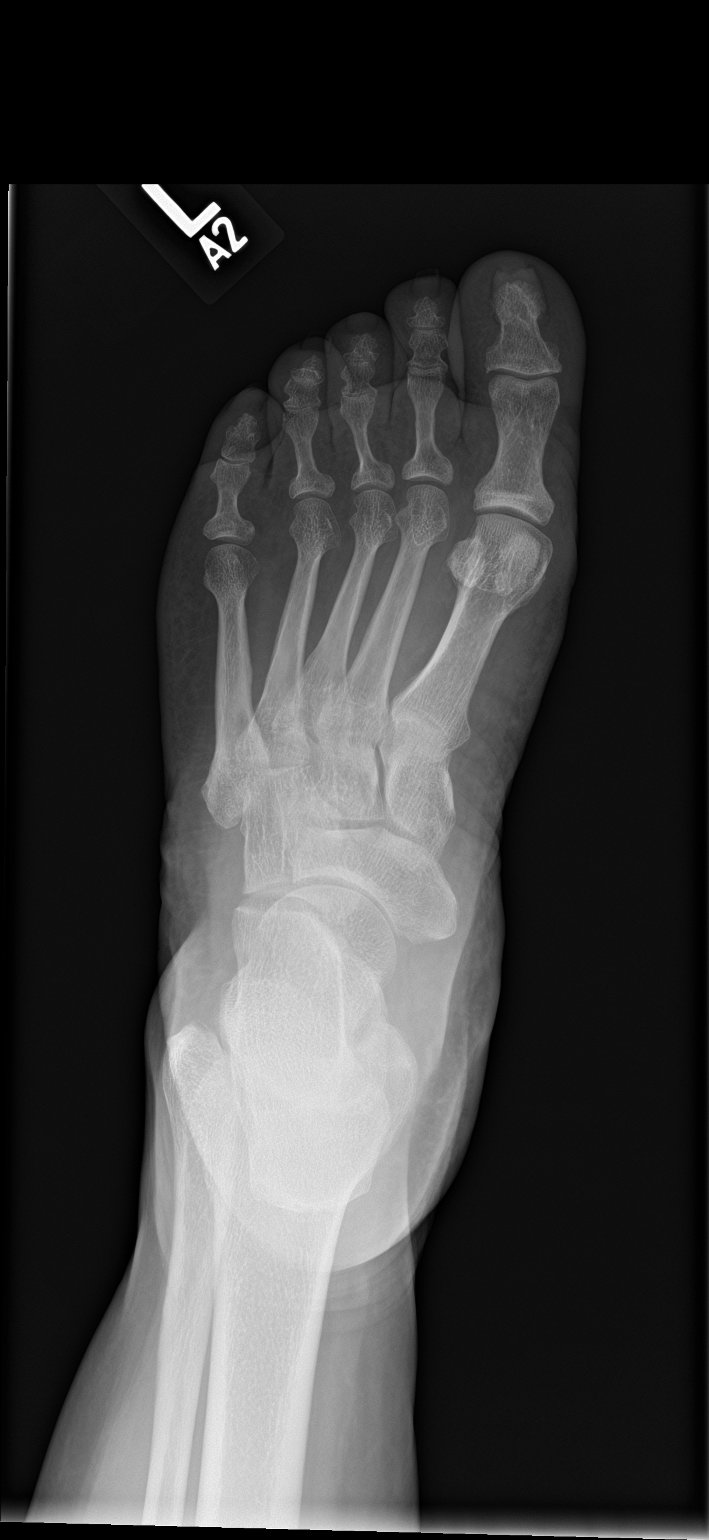

[4 of 4 positions shown; findings below may reference images not displayed]

FINDINGS: Three-view exam the left foot was obtained. Radio-opaque marker has
been placed on the skin at the site of patient concern. No evidence
for an acute fracture. No subluxation or dislocation. No radiopaque
soft tissue foreign body is evident in the region of the marker.
Glass can be occult on x-ray.
IMPRESSION: 1. No acute bony abnormality.
2. No retained radiopaque soft tissue foreign body. Glass can be
occult on x-ray.

## 2019-01-20 ENCOUNTER — Ambulatory Visit
Admission: RE | Admit: 2019-01-20 | Discharge: 2019-01-20 | Disposition: A | Discharge status: Home / Self Care | Payer: Medicare HMO | Source: Ambulatory Visit | Attending: Family Medicine | Admitting: Family Medicine

## 2019-01-20 ENCOUNTER — Other Ambulatory Visit: Payer: Self-pay

## 2019-01-20 DIAGNOSIS — N6011 Diffuse cystic mastopathy of right breast: Secondary | ICD-10-CM | POA: Diagnosis not present

## 2019-01-20 DIAGNOSIS — R921 Mammographic calcification found on diagnostic imaging of breast: Secondary | ICD-10-CM

## 2019-01-20 HISTORY — PX: BREAST BIOPSY: SHX20

## 2019-01-26 DIAGNOSIS — R05 Cough: Secondary | ICD-10-CM | POA: Diagnosis not present

## 2019-01-26 DIAGNOSIS — Z9189 Other specified personal risk factors, not elsewhere classified: Secondary | ICD-10-CM | POA: Diagnosis not present

## 2019-01-26 DIAGNOSIS — Z03818 Encounter for observation for suspected exposure to other biological agents ruled out: Secondary | ICD-10-CM | POA: Diagnosis not present

## 2019-01-28 DIAGNOSIS — R05 Cough: Secondary | ICD-10-CM | POA: Diagnosis not present

## 2019-02-07 ENCOUNTER — Other Ambulatory Visit: Payer: Self-pay

## 2019-02-07 ENCOUNTER — Ambulatory Visit
Admission: EM | Admit: 2019-02-07 | Discharge: 2019-02-07 | Disposition: A | Discharge status: Home / Self Care | Payer: Medicare HMO | Attending: Emergency Medicine | Admitting: Emergency Medicine

## 2019-02-07 ENCOUNTER — Encounter: Payer: Self-pay | Admitting: Emergency Medicine

## 2019-02-07 DIAGNOSIS — Z20828 Contact with and (suspected) exposure to other viral communicable diseases: Secondary | ICD-10-CM

## 2019-02-07 DIAGNOSIS — R05 Cough: Secondary | ICD-10-CM

## 2019-02-07 DIAGNOSIS — R059 Cough, unspecified: Secondary | ICD-10-CM

## 2019-02-07 DIAGNOSIS — I10 Essential (primary) hypertension: Secondary | ICD-10-CM | POA: Diagnosis not present

## 2019-02-07 DIAGNOSIS — J4531 Mild persistent asthma with (acute) exacerbation: Secondary | ICD-10-CM | POA: Diagnosis not present

## 2019-02-07 MED ORDER — PREDNISONE 20 MG PO TABS: 40.0000 mg | ORAL_TABLET | Freq: Every day | ORAL | 0 refills | Status: DC

## 2019-02-07 MED ORDER — PROAIR HFA 108 (90 BASE) MCG/ACT IN AERS: 2.0000 | INHALATION_SPRAY | Freq: Four times a day (QID) | RESPIRATORY_TRACT | 0 refills | Status: DC | PRN

## 2019-02-07 MED ORDER — AEROCHAMBER PLUS FLO-VU MEDIUM MISC: 1.0000 | Freq: Once | 0 refills | Status: AC

## 2019-02-07 NOTE — Discharge Instructions (Signed)
Your COVID test is pending - it is important to quarantine / isolate at home until your results are back. °If you test positive and would like further evaluation for persistent or worsening symptoms, you may schedule an E-visit or virtual (video) visit throughout the [facility] MyChart app or website. ° °PLEASE NOTE: If you develop severe chest pain or shortness of breath please go to the ER or call 9-1-1 for further evaluation --> DO NOT schedule electronic or virtual visits for this. °Please call our office for further guidance / recommendations as needed. °
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
## 2019-02-07 NOTE — ED Triage Notes (Addendum)
Pt presents to [facility] for assessment of 2-3 weeks of shortness of breath, wheezing, cough.  Pt states she ran out of her inhaler 3 days ago.  Patient states she was up all last night due to coughing, and she vomits from coughing so hard sometimes.

## 2019-02-08 LAB — NOVEL CORONAVIRUS, NAA: SARS-CoV-2, NAA: NOT DETECTED

## 2019-02-14 ENCOUNTER — Encounter: Payer: Self-pay | Admitting: Emergency Medicine

## 2019-02-14 ENCOUNTER — Other Ambulatory Visit: Payer: Self-pay

## 2019-02-14 ENCOUNTER — Ambulatory Visit (INDEPENDENT_AMBULATORY_CARE_PROVIDER_SITE_OTHER): Payer: Medicare HMO

## 2019-02-14 ENCOUNTER — Ambulatory Visit
Admission: EM | Admit: 2019-02-14 | Discharge: 2019-02-14 | Disposition: A | Discharge status: Home / Self Care | Payer: Medicare HMO | Attending: Physician Assistant | Admitting: Physician Assistant

## 2019-02-14 DIAGNOSIS — J4541 Moderate persistent asthma with (acute) exacerbation: Secondary | ICD-10-CM | POA: Diagnosis not present

## 2019-02-14 DIAGNOSIS — R062 Wheezing: Secondary | ICD-10-CM | POA: Diagnosis not present

## 2019-02-14 DIAGNOSIS — R0602 Shortness of breath: Secondary | ICD-10-CM

## 2019-02-14 DIAGNOSIS — J209 Acute bronchitis, unspecified: Secondary | ICD-10-CM | POA: Diagnosis not present

## 2019-02-14 DIAGNOSIS — R911 Solitary pulmonary nodule: Secondary | ICD-10-CM | POA: Diagnosis not present

## 2019-02-14 DIAGNOSIS — R05 Cough: Secondary | ICD-10-CM | POA: Diagnosis not present

## 2019-02-14 MED ORDER — DEXAMETHASONE SODIUM PHOSPHATE 10 MG/ML IJ SOLN
10.0000 mg | Freq: Once | INTRAMUSCULAR | Status: AC
  Administered 2019-02-14: 10 mg via INTRAMUSCULAR

## 2019-02-14 MED ORDER — DOXYCYCLINE HYCLATE 100 MG PO CAPS: 100.0000 mg | ORAL_CAPSULE | Freq: Two times a day (BID) | ORAL | 0 refills | Status: DC

## 2019-02-14 MED ORDER — IPRATROPIUM-ALBUTEROL 0.5-2.5 (3) MG/3ML IN SOLN: 3.0000 mL | RESPIRATORY_TRACT | 0 refills | Status: DC | PRN

## 2019-02-14 MED ORDER — ALBUTEROL SULFATE (2.5 MG/3ML) 0.083% IN NEBU: 2.5000 mg | INHALATION_SOLUTION | Freq: Four times a day (QID) | RESPIRATORY_TRACT | 0 refills | Status: DC | PRN

## 2019-02-14 NOTE — ED Notes (Signed)
Patient able to ambulate independently  
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
## 2019-02-14 NOTE — ED Triage Notes (Addendum)
Pt presents to [facility] for assessment of continued cough, shortness of breath since her visit last Saturday.  Pt states she had been using the prescribed inhaler 3 times a day without relief of symptoms.  RR 22 at triage, coughing, able to speak in full sentences.  Pt states she woke up gasping for air this morning.  Pt is requesting a chest xray.  Denies swelling in her legs.

## 2019-02-14 NOTE — Discharge Instructions (Addendum)
As discussed, will cover you for pneumonia/bronchitis with doxycycline.  When you get home, do 1 dose of DuoNeb solution.  If needed, you can do the second DuoNeb in 4 to 6 hours.  Albuterol nebulizer solution and inhaler as needed.  If continues to cough, please give me a call, we may add on more steroids.  Given x-ray shows lung nodule, please follow-up with PCP for further evaluation and CT scan as recommended.  I have attached some information about lung nodule.

## 2019-02-14 NOTE — ED Notes (Signed)
Nebulizer machine provided, appropriate forms signed, and education done.  Patient verbalized understanding, and number for [employer] reviewed.

## 2019-02-27 DIAGNOSIS — J189 Pneumonia, unspecified organism: Secondary | ICD-10-CM | POA: Diagnosis not present

## 2019-02-27 DIAGNOSIS — R0602 Shortness of breath: Secondary | ICD-10-CM | POA: Diagnosis not present

## 2019-02-27 DIAGNOSIS — Z20822 Contact with and (suspected) exposure to covid-19: Secondary | ICD-10-CM | POA: Diagnosis not present

## 2019-02-27 DIAGNOSIS — E1165 Type 2 diabetes mellitus with hyperglycemia: Secondary | ICD-10-CM | POA: Diagnosis not present

## 2019-02-27 DIAGNOSIS — R9431 Abnormal electrocardiogram [ECG] [EKG]: Secondary | ICD-10-CM | POA: Diagnosis not present

## 2019-02-27 DIAGNOSIS — E871 Hypo-osmolality and hyponatremia: Secondary | ICD-10-CM | POA: Diagnosis not present

## 2019-02-27 DIAGNOSIS — R0902 Hypoxemia: Secondary | ICD-10-CM | POA: Diagnosis not present

## 2019-02-27 DIAGNOSIS — I340 Nonrheumatic mitral (valve) insufficiency: Secondary | ICD-10-CM | POA: Diagnosis not present

## 2019-02-27 DIAGNOSIS — J156 Pneumonia due to other aerobic Gram-negative bacteria: Secondary | ICD-10-CM | POA: Diagnosis not present

## 2019-02-27 DIAGNOSIS — J159 Unspecified bacterial pneumonia: Secondary | ICD-10-CM | POA: Diagnosis not present

## 2019-02-27 DIAGNOSIS — R918 Other nonspecific abnormal finding of lung field: Secondary | ICD-10-CM | POA: Diagnosis not present

## 2019-02-27 DIAGNOSIS — E038 Other specified hypothyroidism: Secondary | ICD-10-CM | POA: Diagnosis not present

## 2019-02-27 DIAGNOSIS — I10 Essential (primary) hypertension: Secondary | ICD-10-CM | POA: Diagnosis not present

## 2019-02-27 DIAGNOSIS — Z794 Long term (current) use of insulin: Secondary | ICD-10-CM | POA: Diagnosis not present

## 2019-02-27 DIAGNOSIS — J9601 Acute respiratory failure with hypoxia: Secondary | ICD-10-CM | POA: Diagnosis not present

## 2019-02-27 DIAGNOSIS — Z6841 Body Mass Index (BMI) 40.0 and over, adult: Secondary | ICD-10-CM | POA: Diagnosis not present

## 2019-02-27 DIAGNOSIS — E876 Hypokalemia: Secondary | ICD-10-CM | POA: Diagnosis not present

## 2019-02-27 DIAGNOSIS — E782 Mixed hyperlipidemia: Secondary | ICD-10-CM | POA: Diagnosis not present

## 2019-03-12 DIAGNOSIS — R21 Rash and other nonspecific skin eruption: Secondary | ICD-10-CM | POA: Diagnosis not present

## 2019-03-12 DIAGNOSIS — E1165 Type 2 diabetes mellitus with hyperglycemia: Secondary | ICD-10-CM | POA: Diagnosis not present

## 2019-03-12 DIAGNOSIS — E876 Hypokalemia: Secondary | ICD-10-CM | POA: Diagnosis not present

## 2019-03-12 DIAGNOSIS — J189 Pneumonia, unspecified organism: Secondary | ICD-10-CM | POA: Diagnosis not present

## 2019-03-17 DIAGNOSIS — M170 Bilateral primary osteoarthritis of knee: Secondary | ICD-10-CM | POA: Diagnosis not present

## 2019-03-17 DIAGNOSIS — M1712 Unilateral primary osteoarthritis, left knee: Secondary | ICD-10-CM | POA: Diagnosis not present

## 2019-03-17 DIAGNOSIS — M1711 Unilateral primary osteoarthritis, right knee: Secondary | ICD-10-CM | POA: Diagnosis not present

## 2019-03-18 DIAGNOSIS — E876 Hypokalemia: Secondary | ICD-10-CM | POA: Diagnosis not present

## 2019-04-08 DIAGNOSIS — E1169 Type 2 diabetes mellitus with other specified complication: Secondary | ICD-10-CM | POA: Diagnosis not present

## 2019-04-08 DIAGNOSIS — Z794 Long term (current) use of insulin: Secondary | ICD-10-CM | POA: Diagnosis not present

## 2019-04-08 DIAGNOSIS — R519 Headache, unspecified: Secondary | ICD-10-CM | POA: Diagnosis not present

## 2019-04-08 DIAGNOSIS — E1165 Type 2 diabetes mellitus with hyperglycemia: Secondary | ICD-10-CM | POA: Diagnosis not present

## 2019-04-08 DIAGNOSIS — M316 Other giant cell arteritis: Secondary | ICD-10-CM | POA: Diagnosis not present

## 2019-04-14 ENCOUNTER — Other Ambulatory Visit: Payer: Self-pay | Admitting: Family Medicine

## 2019-04-14 DIAGNOSIS — R911 Solitary pulmonary nodule: Secondary | ICD-10-CM

## 2019-04-18 ENCOUNTER — Other Ambulatory Visit: Payer: Self-pay | Admitting: Family Medicine

## 2019-04-18 DIAGNOSIS — R42 Dizziness and giddiness: Secondary | ICD-10-CM

## 2019-04-18 DIAGNOSIS — R519 Headache, unspecified: Secondary | ICD-10-CM

## 2019-04-22 DIAGNOSIS — R700 Elevated erythrocyte sedimentation rate: Secondary | ICD-10-CM | POA: Diagnosis not present

## 2019-04-24 ENCOUNTER — Ambulatory Visit
Admission: RE | Admit: 2019-04-24 | Discharge: 2019-04-24 | Disposition: A | Discharge status: Home / Self Care | Payer: Medicare HMO | Source: Ambulatory Visit | Attending: Family Medicine | Admitting: Family Medicine

## 2019-04-24 DIAGNOSIS — R918 Other nonspecific abnormal finding of lung field: Secondary | ICD-10-CM | POA: Diagnosis not present

## 2019-04-24 DIAGNOSIS — R911 Solitary pulmonary nodule: Secondary | ICD-10-CM

## 2019-04-24 MED ORDER — IOPAMIDOL (ISOVUE-300) INJECTION 61%
75.0000 mL | Freq: Once | INTRAVENOUS | Status: AC | PRN
  Administered 2019-04-24: 75 mL via INTRAVENOUS

## 2019-05-07 ENCOUNTER — Ambulatory Visit
Admission: RE | Admit: 2019-05-07 | Discharge: 2019-05-07 | Disposition: A | Discharge status: Home / Self Care | Payer: Medicare HMO | Source: Ambulatory Visit | Attending: Family Medicine | Admitting: Family Medicine

## 2019-05-07 DIAGNOSIS — R519 Headache, unspecified: Secondary | ICD-10-CM | POA: Diagnosis not present

## 2019-05-07 DIAGNOSIS — R42 Dizziness and giddiness: Secondary | ICD-10-CM

## 2019-05-07 MED ORDER — GADOBENATE DIMEGLUMINE 529 MG/ML IV SOLN
20.0000 mL | Freq: Once | INTRAVENOUS | Status: AC | PRN
  Administered 2019-05-07: 20 mL via INTRAVENOUS

## 2019-05-26 ENCOUNTER — Other Ambulatory Visit: Payer: Medicare HMO

## 2019-05-29 ENCOUNTER — Encounter: Payer: Self-pay | Admitting: Physician Assistant
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
## 2019-06-01 ENCOUNTER — Telehealth: Payer: Self-pay | Admitting: *Deleted

## 2019-06-01 ENCOUNTER — Encounter: Payer: Self-pay | Admitting: Physician Assistant

## 2019-06-01 ENCOUNTER — Other Ambulatory Visit: Payer: Self-pay

## 2019-06-01 ENCOUNTER — Ambulatory Visit (INDEPENDENT_AMBULATORY_CARE_PROVIDER_SITE_OTHER): Payer: Medicare HMO | Admitting: Physician Assistant

## 2019-06-01 VITALS — BP 126/80 | HR 82 | Ht 65.0 in | Wt 276.8 lb

## 2019-06-01 DIAGNOSIS — I2510 Atherosclerotic heart disease of native coronary artery without angina pectoris: Secondary | ICD-10-CM

## 2019-06-01 DIAGNOSIS — R9431 Abnormal electrocardiogram [ECG] [EKG]: Secondary | ICD-10-CM

## 2019-06-01 DIAGNOSIS — E785 Hyperlipidemia, unspecified: Secondary | ICD-10-CM

## 2019-06-01 DIAGNOSIS — I10 Essential (primary) hypertension: Secondary | ICD-10-CM

## 2019-06-01 DIAGNOSIS — I6523 Occlusion and stenosis of bilateral carotid arteries: Secondary | ICD-10-CM

## 2019-06-01 DIAGNOSIS — I739 Peripheral vascular disease, unspecified: Secondary | ICD-10-CM | POA: Diagnosis not present

## 2019-06-01 LAB — COMPREHENSIVE METABOLIC PANEL
ALT: 44 IU/L — ABNORMAL HIGH (ref 0–32)
AST: 44 IU/L — ABNORMAL HIGH (ref 0–40)
Albumin/Globulin Ratio: 1.3 (ref 1.2–2.2)
Albumin: 4.3 g/dL (ref 3.8–4.8)
Alkaline Phosphatase: 88 IU/L (ref 39–117)
BUN/Creatinine Ratio: 11 — ABNORMAL LOW (ref 12–28)
BUN: 10 mg/dL (ref 8–27)
Bilirubin Total: 0.8 mg/dL (ref 0.0–1.2)
CO2: 22 mmol/L (ref 20–29)
Calcium: 9.9 mg/dL (ref 8.7–10.3)
Chloride: 97 mmol/L (ref 96–106)
Creatinine, Ser: 0.87 mg/dL (ref 0.57–1.00)
GFR calc Af Amer: 81 mL/min/{1.73_m2} (ref >59)
GFR calc non Af Amer: 71 mL/min/{1.73_m2} (ref >59)
Globulin, Total: 3.3 g/dL (ref 1.5–4.5)
Glucose: 190 mg/dL — ABNORMAL HIGH (ref 65–99)
Potassium: 3.5 mmol/L (ref 3.5–5.2)
Sodium: 136 mmol/L (ref 134–144)
Total Protein: 7.6 g/dL (ref 6.0–8.5)

## 2019-06-01 LAB — MAGNESIUM: Magnesium: 1.8 mg/dL (ref 1.6–2.3)

## 2019-06-01 MED ORDER — ROSUVASTATIN CALCIUM 40 MG PO TABS: 40.0000 mg | ORAL_TABLET | Freq: Every day | ORAL | 3 refills | Status: DC

## 2019-06-01 NOTE — Telephone Encounter (Signed)
  Patient Consent for Virtual Visit         [name] has provided verbal consent on 06/01/2019 for a virtual visit (video or telephone).   CONSENT FOR VIRTUAL VISIT FOR:  [name]  By participating in this virtual visit I agree to the following:  I hereby voluntarily request, consent and authorize [facility] and its employed or contracted physicians, physician assistants, nurse practitioners or other licensed health care professionals (the Practitioner), to provide me with telemedicine health care services (the "Services") as deemed necessary by the treating Practitioner. I acknowledge and consent to receive the Services by the Practitioner via telemedicine. I understand that the telemedicine visit will involve communicating with the Practitioner through live audiovisual communication technology and the disclosure of certain medical information by electronic transmission. I acknowledge that I have been given the opportunity to request an in-person assessment or other available alternative prior to the telemedicine visit and am voluntarily participating in the telemedicine visit.  I understand that I have the right to withhold or withdraw my consent to the use of telemedicine in the course of my care at any time, without affecting my right to future care or treatment, and that the Practitioner or I may terminate the telemedicine visit at any time. I understand that I have the right to inspect all information obtained and/or recorded in the course of the telemedicine visit and may receive copies of available information for a reasonable fee.  I understand that some of the potential risks of receiving the Services via telemedicine include:  Marland Kitchen Delay or interruption in medical evaluation due to technological equipment failure or disruption; . Information transmitted may not be sufficient (e.g. poor resolution of images) to allow for appropriate medical decision making by the Practitioner;  and/or  . In rare instances, security protocols could fail, causing a breach of personal health information.  Furthermore, I acknowledge that it is my responsibility to provide information about my medical history, conditions and care that is complete and accurate to the best of my ability. I acknowledge that Practitioner's advice, recommendations, and/or decision may be based on factors not within their control, such as incomplete or inaccurate data provided by me or distortions of diagnostic images or specimens that may result from electronic transmissions. I understand that the practice of medicine is not an exact science and that Practitioner makes no warranties or guarantees regarding treatment outcomes. I acknowledge that a copy of this consent can be made available to me via my patient portal [facility] MyChart), or I can request a printed copy by calling the office of [facility].    I understand that my insurance will be billed for this visit.   I have read or had this consent read to me. . I understand the contents of this consent, which adequately explains the benefits and risks of the Services being provided via telemedicine.  . I have been provided ample opportunity to ask questions regarding this consent and the Services and have had my questions answered to my satisfaction. . I give my informed consent for the services to be provided through the use of telemedicine in my medical care

## 2019-06-01 NOTE — Patient Instructions (Signed)
Medication Instructions:  Your physician has recommended you make the following change in your medication:  1.  INCREASE the Crestor to 40 mg daily   *If you need a refill on your cardiac medications before your next appointment, please call your pharmacy*   Lab Work: TODAY:  CMET & MAG 07/13/19:  COME TO THE OFFICE FOR FASTING LABS LFT & LIPID  If you have labs (blood work) drawn today and your tests are completely normal, you will receive your results only by: Marland Kitchen MyChart Message (if you have MyChart) OR . A paper copy in the mail If you have any lab test that is abnormal or we need to change your treatment, we will call you to review the results.   Testing/Procedures: Your physician has requested that you have a carotid duplex. This test is an ultrasound of the carotid arteries in your neck. It looks at blood flow through these arteries that supply the brain with blood. Allow one hour for this exam. There are no restrictions or special instructions.  Your physician has requested that you have a lower or upper extremity arterial duplex. This test is an ultrasound of the arteries in the legs or arms. It looks at arterial blood flow in the legs and arms. Allow one hour for Lower and Upper Arterial scans. There are no restrictions or special instructions    Follow-Up: At [facility], you and your health needs are our priority.  As part of our continuing mission to provide you with exceptional heart care, we have created designated Provider Care Teams.  These Care Teams include your primary Cardiologist (physician) and Advanced Practice Providers (APPs -  Physician Assistants and Nurse Practitioners) who all work together to provide you with the care you need, when you need it.  We recommend signing up for the patient portal called "MyChart".  Sign up information is provided on this After Visit Summary.  MyChart is used to connect with patients for Virtual Visits (Telemedicine).  Patients  are able to view lab/test results, encounter notes, upcoming appointments, etc.  Non-urgent messages can be sent to your provider as well.   To learn more about what you can do with MyChart, go to [email or web address].    Your next appointment:   2 months   07/27/2019 3:45.  Someone will call you at 3:30 to get you ready for the visit.  See instructions below for virtual visit.   The format for your next appointment:   Virtual  Provider:   You may see Jenkins Rouge, MD   or one of the following Advanced Practice Providers on your designated Care Team:    Truitt Merle, NP  Cecilie Kicks, NP  Kathyrn Drown, NP    Other Instructions Your [facility] team (Cardiologist [facility] Doctor] and Advanced Practice Provider [phone] Assistant; Nurse Practitioner]) has arranged for your next office appointment to be a virtual visit (also known as "Telehealth", "Telemedicine", "E-Visit").   We now offer virtual visits for all our patients.  This helps [place] to expand our ability to see patients in a timely and safe manner.  These visits are billed to your insurance just like traditional, in person, appointments.  Please review this IMPORTANT information about your upcoming appointment.   **PLEASE READ THE SECTION BELOW LABELED "CONSENT".**   **CALL OUR OFFICE WITH QUESTIONS.**   WHAT YOU NEED FOR YOUR VIRTUAL VISIT:  You will need a SmartPhone with microphone and video capability.  [If you are using MyChart to connect to  your visit, it is also possible to use a desktop/laptop computer (with an Internet connection), as long as you have microphone and video capability.]  You will need to use [employer], [employer] or [employer] as Medical sales representative.  We highly recommend that you have a MyChart account as this will make connecting to your visit seamless.  A MyChart account not only allows you to connect to your provider for a virtual visit, but also allows you to see the results of all your tests,  provider notes, medications and upcoming appointments.    A MyChart account is not absolutely necessary.  We can still complete your visit if you do not have one.    If you do not have a computer or SmartPhone with video/microphone capability or your Internet/cell service is weak on the day of your visit, we will do your visit by telephone.    A blood pressure cuff and scale are essential to collect your vital signs at home.  If you do not have these and you are unable to obtain them, please contact our office so that we can make arrangements for you.  If you have a pulse oximeter, Apple watch, Kardia mobile device, etc, you can collect data from these devices as well to share with the provider for your visit.  These devices are not required for a virtual visit.    WHAT TO DO ON THE DAY OF YOUR APPOINTMENT: 30 minutes before your appointment:  Take your blood pressure, pulse or heart rate (if your blood pressure machine is able to collect it) and weight.  Write all these numbers down so you can give it to the nurse or medical assistant that calls.  Get all of the medications you currently take and put them where you will be sitting for the appointment.  The nurse/medical assistant will go over these with you when he/she calls.  15 minutes before your appointment:  You will receive a phone call from a nurse or medical assistant from our office.  The caller ID on your phone may indicate that the caller is either "[facility]" or "[place]".  However, the number may come across as spam.  Please turn off any spam blocker so you do not miss the call.  The nurse will:  Ask for your blood pressure, pulse, weight, height.  Go over all of your medications to make sure your chart is correct.  Review your allergies, smoking history, reason for appointment, etc.   Give you instructions on how to connect with the video platform or telephone.  IF YOUR VISIT IS BY TELEPHONE ONLY: After the  nurse finishes getting you ready for the visit, your provider will call you on the phone number you provide to [place].   TO CONNECT WITH YOUR PROVIDER FOR YOUR APPOINTMENT (BY VIDEO): You will either connect with the provider with your MyChart account or (if you are not using MyChart) with a link sent to your SmartPhone by text message.  If you are using MyChart, see below.  If you are not using MyChart, the nurse will send you the text message during the phone call.     If you are connecting with your MyChart account:   (The nurse that calls you will tell you when to do this):  You will log into your account. At the top of your home screen, you should see the following prompt that tells you to "Begin your video visit with . . . ".  Click the green button (BEGIN VISIT).    The next screen will say "It's time to start your video visit!" Click the green button (BEGIN VIDEO VISIT)    There may be a screen that appears that asks for permission to use your camera and/or microphone.  Click ALLOW.  This will open the browser where your appointment will take place.   You may see the message: "Welcome.  Waiting for the call to begin."  Or, you will see that the nurse or your provider are already "in" the room waiting on you.   If you are connecting with a link sent to your SmartPhone via text: The nurse that calls you will send the link by text message. Click on the link (it should look something like this):     If asked to give permission to use the camera and or microphone, click ALLOW This will open the browser where your appointment will take place.      You may see the message: "Welcome.  Waiting for the call to begin."  Or, you will see that the nurse or your provider are already "in" the room waiting on you.    The controls for your visit look like the picture below.  Please note that this is what the microphone and camera look like when they are ON.  If muted, they will have a line  through them.    After the appointment: Once your provider leaves the appointment, he/she will go over instructions with the nurse/medical assistant.  If needed, this person will call you with any instructions, appointments, etc.   A copy of your After Visit Summary (AVS) will be available later that day in your MyChart account.  This document will have all of your instructions, medications, appointments, etc.  If you are not using MyChart, we will mail it to your home.     *CONSENT FOR TELE-HEALTH VISIT - PLEASE REVIEW* By participating in the scheduled virtual visit (and any virtual visit scheduled within 365 days of the printing of this document), I agree to the following:   I hereby voluntarily request, consent and authorize [facility] and its employed or contracted physicians, physician assistants, nurse practitioners or other licensed health care professionals (the Practitioner), to provide me with telemedicine health care services (the "Services") as deemed necessary by the treating Practitioner. I acknowledge and consent to receive the Services by the Practitioner via telemedicine. I understand that the telemedicine visit will involve communicating with the Practitioner through live audiovisual communication technology and the disclosure of certain medical information by electronic transmission. I acknowledge that I have been given the opportunity to request an in-person assessment or other available alternative prior to the telemedicine visit and am voluntarily participating in the telemedicine visit.  I understand that I have the right to withhold or withdraw my consent to the use of telemedicine in the course of my care at any time, without affecting my right to future care or treatment, and that the Practitioner or I may terminate the telemedicine visit at any time. I understand that I have the right to inspect all information obtained and/or recorded in the course of the telemedicine  visit and may receive copies of available information for a reasonable fee.  I understand that some of the potential risks of receiving the Services via telemedicine include:  Marland Kitchen Delay or interruption in medical evaluation due to technological equipment failure or disruption; . Information transmitted may not be sufficient (e.g. poor resolution  of images) to allow for appropriate medical decision making by the Practitioner; and/or  . In rare instances, security protocols could fail, causing a breach of personal health information.  Furthermore, I acknowledge that it is my responsibility to provide information about my medical history, conditions and care that is complete and accurate to the best of my ability. I acknowledge that Practitioner's advice, recommendations, and/or decision may be based on factors not within their control, such as incomplete or inaccurate data provided by me or distortions of diagnostic images or specimens that may result from electronic transmissions. I understand that the practice of medicine is not an exact science and that Practitioner makes no warranties or guarantees regarding treatment outcomes. I acknowledge that a copy of this consent can be made available to me via my patient portal [facility] MyChart), or I can request a printed copy by calling the office of [facility].    I understand that my insurance will be billed for this visit.   I have read or had this consent read to me. . I understand the contents of this consent, which adequately explains the benefits and risks of the Services being provided via telemedicine.  . I have been provided ample opportunity to ask questions regarding this consent and the Services and have had my questions answered to my satisfaction. . I give my informed consent for the services to be provided through the use of telemedicine in my medical care

## 2019-06-02 ENCOUNTER — Other Ambulatory Visit: Payer: Self-pay | Admitting: *Deleted

## 2019-06-02 ENCOUNTER — Telehealth: Payer: Self-pay | Admitting: *Deleted

## 2019-06-02 DIAGNOSIS — I2510 Atherosclerotic heart disease of native coronary artery without angina pectoris: Secondary | ICD-10-CM

## 2019-06-02 DIAGNOSIS — I10 Essential (primary) hypertension: Secondary | ICD-10-CM

## 2019-06-02 NOTE — Telephone Encounter (Signed)
Pt notified. She checked her medication bottles and reports she has been taking Klor Con M10. She takes 2 tablets twice daily. I told patient I would make Ronie Spies, PA aware and we would call her back with recommendations.  Order for follow up CMET placed when vascular studies done. Lab results forwarded to patient's PCP

## 2019-06-02 NOTE — Telephone Encounter (Signed)
-----   Message from Laurann [place], [place] sent at 06/02/2019 11:51 AM EDT ----- Please let pt know labs are stable except a few abnormalities: 1) her liver function numbers are mildly elevated. This looks to be a frequent finding back to even 2014. Her prior CT scan showed fatty liver which should be followed by her primary care doctor. I'd like her to make sure she's seeing them and touch base about elevated liver function. However, since they are relatively stable over the years even with Crestor therapy, will still plan to increase as planned with close lab f/u as we outlined in June. Her potassium level is borderline low. Start KCl daily. Repeat CMET when she returns for her vascular testing. Please increase dietary intake of healthy sources of potassium/magnesium including leafy greens, nuts, seeds, fish, beans, whole grains, avocados, yogurt, and bananas. Dayna Dunn PA-C

## 2019-06-03 ENCOUNTER — Other Ambulatory Visit: Payer: Self-pay | Admitting: Physician Assistant

## 2019-06-03 DIAGNOSIS — I739 Peripheral vascular disease, unspecified: Secondary | ICD-10-CM

## 2019-06-03 MED ORDER — POTASSIUM CHLORIDE CRYS ER 10 MEQ PO TBCR: 30.0000 meq | EXTENDED_RELEASE_TABLET | Freq: Two times a day (BID) | ORAL | 1 refills | Status: DC

## 2019-06-03 NOTE — Telephone Encounter (Signed)
Pt has been made aware to increase her Potassium to 10 meq taking 3 tablets bid. I have added med to pt's MAR and sent in refill to [facility] [address] per pt request.

## 2019-06-03 NOTE — Telephone Encounter (Signed)
Please increase to BID in that case and make sure medication gets added to [facility]. Thanks!

## 2019-06-04 ENCOUNTER — Ambulatory Visit (INDEPENDENT_AMBULATORY_CARE_PROVIDER_SITE_OTHER): Payer: Medicare HMO | Admitting: Neurology

## 2019-06-04 ENCOUNTER — Encounter: Payer: Self-pay | Admitting: Neurology

## 2019-06-04 ENCOUNTER — Other Ambulatory Visit: Payer: Self-pay

## 2019-06-04 VITALS — BP 122/72 | HR 74 | Temp 97.8°F | Ht 65.0 in | Wt 275.0 lb

## 2019-06-04 DIAGNOSIS — G479 Sleep disorder, unspecified: Secondary | ICD-10-CM

## 2019-06-04 DIAGNOSIS — G43019 Migraine without aura, intractable, without status migrainosus: Secondary | ICD-10-CM | POA: Insufficient documentation

## 2019-06-04 HISTORY — DX: Migraine without aura, intractable, without status migrainosus: G43.019

## 2019-06-04 MED ORDER — TOPIRAMATE 25 MG PO TABS: ORAL_TABLET | ORAL | 3 refills | Status: DC

## 2019-06-04 NOTE — Patient Instructions (Addendum)
We will start Topamax for the headache.   Topamax (topiramate) is a seizure medication that has an FDA approval for seizures and for migraine headache. Potential side effects of this medication include weight loss, cognitive slowing, tingling in the fingers and toes, and carbonated drinks will taste bad. If any significant side effects are noted on this drug, please contact our office.  
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
## 2019-06-05 ENCOUNTER — Encounter: Payer: Self-pay | Admitting: Emergency Medicine

## 2019-06-05 ENCOUNTER — Other Ambulatory Visit: Payer: Self-pay

## 2019-06-05 ENCOUNTER — Ambulatory Visit (INDEPENDENT_AMBULATORY_CARE_PROVIDER_SITE_OTHER): Payer: Medicare HMO | Admitting: Emergency Medicine

## 2019-06-05 DIAGNOSIS — R0602 Shortness of breath: Secondary | ICD-10-CM

## 2019-06-05 DIAGNOSIS — R918 Other nonspecific abnormal finding of lung field: Secondary | ICD-10-CM

## 2019-06-05 HISTORY — DX: Other nonspecific abnormal finding of lung field: R91.8

## 2019-06-05 LAB — SEDIMENTATION RATE: Sed Rate: 61 mm/hr — ABNORMAL HIGH (ref 0–40)

## 2019-06-05 LAB — C-REACTIVE PROTEIN: CRP: 4 mg/L (ref 0–10)

## 2019-06-05 NOTE — Progress Notes (Signed)
Subjective:    Patient ID: [name], female    DOB: 26-Jan-1956, 64 y.o.   MRN: [id number]  HPI 64 year old former smoker (15 pack years) with a history of CAD, carotid disease, diabetes, hypertension, hyperlipidemia, hypothyroidism, GERD, morning headaches.  She carries a history of asthma that was made years ago, but she isn't sure whether this was a solid diagnosis.   She is referred today for dyspnea and also a history of pulmonary nodular disease on CT scan of the chest. She does not have exertional SOB, wheeze. She has had PNA and was admitted to [facility] for PNA, COVID negative. She had a CT-PA during that admit, showed basilar GGI and scattered nodules.   CT scan of the chest from 04/24/2019 reviewed by me, shows multiple small pulmonary nodules bilaterally, the largest 1.2 cm in the right upper lobe, some patchy ill-defined areas of groundglass attenuation and septal thickening as well with some associated cylindrical bronchiectatic change and bronchiolectasis most evident in the right upper lobe. Some are stable going back to 2015.    Review of Systems As per HPI  Past Medical History:  Diagnosis Date  . Acid reflux   . Anxiety   . Arthritis   . Asthma   . CAD (coronary artery disease)    a. Cath 2015 - moderate LAD, diagonal, and OM disease with 100% dRCA with L-R collaterals. b. 2019 nuc normal.  . Carotid artery occlusion   . Chronic pain following surgery or procedure 2008   Abdominal pain  . Common migraine with intractable migraine 06/04/2019  . Diabetes mellitus   . Headache(784.0)   . Hyperlipidemia   . Hypertension    stress test- scheduled for 12/30/2012  . Hypothyroidism   . Myocardial infarction (HCC) 2000  . Prolonged QT interval   . Shortness of breath   . Stroke (HCC) 11/2012   tingling in L arm , slurred speech- came to ER     Family History  Problem Relation Age of Onset  . CAD Brother         X 2  . Diabetes Brother   . Heart disease Brother         before age 29  . Hyperlipidemia Brother   . Hypertension Brother   . Heart attack Brother   . CAD Sister         X 1  . Cancer Sister   . Diabetes Sister   . Heart disease Sister        before age 74  . Hyperlipidemia Sister   . Hypertension Sister   . Hypertension Other        ALL  . Diabetes Mother   . Heart disease Mother   . Hyperlipidemia Mother   . Hypertension Mother   . Heart disease Father   . Hyperlipidemia Father   . Hypertension Father   . Hypertension Daughter   . Asthma Other   . Stroke Other      Social History   Socioeconomic History  . Marital status: Widowed    Spouse name: Not on file  . Number of children: Not on file  . Years of education: Not on file  . Highest education level: Not on file  Occupational History  . Not on file  Tobacco Use  . Smoking status: Former Smoker    Packs/day: 0.25    Years: 29.00    Pack years: 7.25    Types: Cigarettes    Quit date:  02/05/2001    Years since quitting: 18.3  . Smokeless tobacco: Never Used  Substance and Sexual Activity  . Alcohol use: Not Currently    Alcohol/week: 2.0 standard drinks    Types: 2 Cans of beer per week    Comment: rarely  . Drug use: No  . Sexual activity: Not on file  Other Topics Concern  . Not on file  Social History Narrative  . Not on file   Social Determinants of Health   Financial Resource Strain:   . Difficulty of Paying Living Expenses:   Food Insecurity:   . Worried About Programme researcher, broadcasting/film/video in the Last Year:   . Barista in the Last Year:   Transportation Needs:   . Freight forwarder (Medical):   Marland Kitchen Lack of Transportation (Non-Medical):   Physical Activity:   . Days of Exercise per Week:   . Minutes of Exercise per Session:   Stress:   . Feeling of Stress :   Social Connections:   . Frequency of Communication with Friends and Family:   . Frequency of Social Gatherings with Friends and Family:   . Attends Religious Services:   . Active  Member of Clubs or Organizations:   . Attends Banker Meetings:   Marland Kitchen Marital Status:   Intimate Partner Violence:   . Fear of Current or Ex-Partner:   . Emotionally Abused:   Marland Kitchen Physically Abused:   . 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  No known exposure to TB  Allergies  Allergen Reactions  . Strawberry Extract Anaphylaxis  . Cymbalta [Duloxetine Hcl] Other (See Comments)    Dizzy   . Lisinopril Itching, Swelling and Other (See Comments)    Angioedema   . Lunesta [Eszopiclone] Other (See Comments)    Bad taste in mouth   . Metformin And Related Other (See Comments)    Damaged patient's kidneys  . Other Other (See Comments)    Pt is a Jehovah Witness. No blood products.  . Sulfa Antibiotics Itching, Swelling and Other (See Comments)    Angioedema   . Tramadol Other (See Comments)    Hallucinations   . Trazodone And Nefazodone Other (See Comments)    Shakes   . Penicillins Rash and Other (See Comments)    "Paralyzed" (per patient) Has patient had a PCN reaction causing immediate rash, facial/tongue/throat swelling, SOB or lightheadedness with hypotension: Yes Has patient had a PCN reaction causing severe rash involving mucus membranes or skin necrosis: No Has patient had a PCN reaction that required hospitalization Yes Has patient had a PCN reaction occurring within the last 10 years: No If all of the above answers are "NO", then may proceed with Cephalosporin use.      Outpatient Medications Prior to Visit  Medication Sig Dispense Refill  . albuterol (PROVENTIL) (2.5 MG/3ML) 0.083% nebulizer solution Take 3 mLs (2.5 mg total) by nebulization every 6 (six) hours as needed for wheezing or shortness of breath. 75 mL 0  . amLODipine (NORVASC) 10 MG tablet Take 10 mg by mouth.    Marland Kitchen aspirin EC 81 MG tablet Take 81 mg by mouth daily.    Marland Kitchen  atenolol-chlorthalidone (TENORETIC) 100-25 MG per tablet Take 1 tablet by mouth daily.    . diclofenac Sodium (VOLTAREN) 1 % GEL Apply 2 g topically  2 (two) times daily.    . diphenhydrAMINE (BENADRYL) 25 MG tablet Take 25 mg by mouth as directed.    . insulin aspart protamine - aspart (NOVOLOG MIX 70/30 FLEXPEN) (70-30) 100 UNIT/ML FlexPen Inject 20-30 Units into the skin See admin instructions. 30 units in the morning and 20 units at night.     Marland Kitchen ipratropium-albuterol (DUONEB) 0.5-2.5 (3) MG/3ML SOLN Take 3 mLs by nebulization every 4 (four) hours as needed. 6 mL 0  . levothyroxine (SYNTHROID) 175 MCG tablet Take 175 mcg by mouth daily before breakfast.    . NOVOLOG 100 UNIT/ML injection Inject 10 Units into the skin 3 (three) times daily.  12  . potassium chloride (KLOR-CON) 10 MEQ tablet Take 3 tablets (30 mEq total) by mouth 2 (two) times daily. 180 tablet 1  . PROAIR HFA 108 (90 Base) MCG/ACT inhaler Inhale 2-3 puffs into the lungs every 6 (six) hours as needed for shortness of breath. 18 g 0  . rosuvastatin (CRESTOR) 40 MG tablet Take 1 tablet (40 mg total) by mouth daily. 90 tablet 3  . topiramate (TOPAMAX) 25 MG tablet Take one tablet at night for one week, then take 2 tablets at night for one week, then take 3 tablets at night. 90 tablet 3  . zolpidem (AMBIEN) 10 MG tablet Take 10 mg by mouth at bedtime.      No facility-administered medications prior to visit.         Objective:   Physical Exam Vitals:   06/05/19 1417  BP: 116/70  Pulse: 77  Temp: 98.2 F (36.8 C)  SpO2: 93%   Gen: Pleasant, obese woman, in no distress,  normal affect  ENT: No lesions,  mouth clear,  oropharynx clear, no postnasal drip  Neck: No JVD, no stridor  Lungs: No use of accessory muscles, no crackles or wheezing on normal respiration, no wheeze on forced expiration  Cardiovascular: RRR, heart sounds normal, no murmur or gallops, no peripheral edema  Musculoskeletal: No deformities, no  cyanosis or clubbing  Neuro: alert, awake, non focal  Skin: Warm, no lesions or rash      Assessment & Plan:  Pulmonary nodules/lesions, multiple Etiology unclear.  Some are longstanding, going back to 2015.  There are some new foci.  Especially an area in the right upper lobe with some emphysematous change associated with groundglass and micronodular disease.  Unclear significance.  Plan for repeat CT scan in 6 months to look for interval stability.  If any significant change then can discuss diagnostics including possible bronchoscopy  SOB (shortness of breath) Carries a possible history of asthma.  At some risk for obstructive disease also from smoking.  She needs pulmonary function testing.  We will arrange for this at her next office visit.  Defer bronchodilators at this time since she is clinically stable.  Baltazar Apo, MD, PhD 06/05/2019, 3:01 PM [facility] [phone] or if no answer [phone]

## 2019-06-05 NOTE — Assessment & Plan Note (Signed)
Etiology unclear.  Some are longstanding, going back to 2015.  There are some new foci.  Especially an area in the right upper lobe with some emphysematous change associated with groundglass and micronodular disease.  Unclear significance.  Plan for repeat CT scan in 6 months to look for interval stability.  If any significant change then can discuss diagnostics including possible bronchoscopy

## 2019-06-05 NOTE — Patient Instructions (Signed)
We will repeat your CT scan of the chest in September 2021 to compare with priors. We will perform pulmonary function testing on the same day as your next visit Follow with Dr. Delton Coombes in September with PFT on the same day so that we can review that test and also your CT scan.

## 2019-06-05 NOTE — Assessment & Plan Note (Signed)
Carries a possible history of asthma.  At some risk for obstructive disease also from smoking.  She needs pulmonary function testing.  We will arrange for this at her next office visit.  Defer bronchodilators at this time since she is clinically stable.

## 2019-06-05 NOTE — Addendum Note (Signed)
Addended by: Delrae Rend on: 06/05/2019 03:52 PM   Modules accepted: Orders

## 2019-06-11 ENCOUNTER — Ambulatory Visit (HOSPITAL_COMMUNITY)
Admission: RE | Admit: 2019-06-11 | Discharge: 2019-06-11 | Disposition: A | Discharge status: Home / Self Care | Payer: Medicare HMO | Source: Ambulatory Visit | Attending: Cardiology | Admitting: Cardiology

## 2019-06-11 ENCOUNTER — Other Ambulatory Visit: Payer: Self-pay

## 2019-06-11 ENCOUNTER — Ambulatory Visit (HOSPITAL_BASED_OUTPATIENT_CLINIC_OR_DEPARTMENT_OTHER)
Admission: RE | Admit: 2019-06-11 | Discharge: 2019-06-11 | Disposition: A | Discharge status: Home / Self Care | Payer: Medicare HMO | Source: Ambulatory Visit | Attending: Physician Assistant | Admitting: Physician Assistant

## 2019-06-11 DIAGNOSIS — I739 Peripheral vascular disease, unspecified: Secondary | ICD-10-CM | POA: Diagnosis not present

## 2019-06-11 DIAGNOSIS — I6523 Occlusion and stenosis of bilateral carotid arteries: Secondary | ICD-10-CM | POA: Diagnosis not present

## 2019-06-12 ENCOUNTER — Telehealth: Payer: Self-pay

## 2019-06-12 DIAGNOSIS — I739 Peripheral vascular disease, unspecified: Secondary | ICD-10-CM

## 2019-06-12 NOTE — Telephone Encounter (Signed)
-----   Message from Laurann Montana, [place] sent at 06/12/2019  7:52 AM EDT ----- See also carotid duplex. Please let pt know her lower extremity testing suggests she has blockages in her leg arteries. I suspect this causes her leg pain. Please arrange a PV consult with Dr. Kirke Corin or Dr. Allyson Sabal next available. Thanks.

## 2019-06-12 NOTE — Telephone Encounter (Signed)
-----   Message from Laurann Montana, [place] sent at 06/12/2019  7:51 AM EDT ----- Gillis Ends is out. Please let pt know carotid duplex is stable, mild plaque noted, no significant progression. But please see second result note on lower extremity testing.

## 2019-06-12 NOTE — Telephone Encounter (Signed)
Spoke with the pt and she verbalized understanding of her Vasc US of her lower extremities and agrees to referral to PV.. Dr. Kirke Corin or Dr. Allyson Sabal.

## 2019-06-12 NOTE — Telephone Encounter (Signed)
Pt verbalized understanding of her Carotid artery [place].

## 2019-06-16 ENCOUNTER — Other Ambulatory Visit: Payer: Self-pay

## 2019-06-16 ENCOUNTER — Encounter: Payer: Self-pay | Admitting: Neurology

## 2019-06-16 ENCOUNTER — Ambulatory Visit (INDEPENDENT_AMBULATORY_CARE_PROVIDER_SITE_OTHER): Payer: Medicare HMO | Admitting: Neurology

## 2019-06-16 VITALS — BP 119/72 | HR 73 | Temp 97.5°F | Ht 65.0 in | Wt 273.0 lb

## 2019-06-16 DIAGNOSIS — I6529 Occlusion and stenosis of unspecified carotid artery: Secondary | ICD-10-CM | POA: Diagnosis not present

## 2019-06-16 DIAGNOSIS — R519 Headache, unspecified: Secondary | ICD-10-CM

## 2019-06-16 DIAGNOSIS — R351 Nocturia: Secondary | ICD-10-CM

## 2019-06-16 DIAGNOSIS — Z6841 Body Mass Index (BMI) 40.0 and over, adult: Secondary | ICD-10-CM | POA: Diagnosis not present
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
## 2019-06-16 NOTE — Patient Instructions (Signed)
  Based on your symptoms and your exam I believe you are at risk for obstructive sleep apnea (aka OSA), and I think we should proceed with a sleep study to determine whether you do or do not have OSA and how severe it is. Even, if you have mild OSA, I may want you to consider treatment with CPAP, as treatment of even borderline or mild sleep apnea can result and improvement of symptoms such as sleep disruption, daytime sleepiness, nighttime bathroom breaks, restless leg symptoms, improvement of headache syndromes, even improved mood disorder.   Please remember, the long-term risks and ramifications of untreated moderate to severe obstructive sleep apnea are: increased Cardiovascular disease, including congestive heart failure, stroke, difficult to control hypertension, treatment resistant obesity, arrhythmias, especially irregular heartbeat commonly known as A. Fib. (atrial fibrillation); even type 2 diabetes has been linked to untreated OSA.   Sleep apnea can cause disruption of sleep and sleep deprivation in most cases, which, in turn, can cause recurrent headaches, problems with memory, mood, concentration, focus, and vigilance. Most people with untreated sleep apnea report excessive daytime sleepiness, which can affect their ability to drive. Please do not drive if you feel sleepy. Patients with sleep apnea developed difficulty initiating and maintaining sleep (aka insomnia).   Having sleep apnea may increase your risk for other sleep disorders, including involuntary behaviors sleep such as sleep terrors, sleep talking, sleepwalking.    Having sleep apnea can also increase your risk for restless leg syndrome and leg movements at night.   Please note that untreated obstructive sleep apnea may carry additional perioperative morbidity. Patients with significant obstructive sleep apnea (typically, in the moderate to severe degree) should receive, if possible, perioperative PAP (positive airway pressure)  therapy and the surgeons and particularly the anesthesiologists should be informed of the diagnosis and the severity of the sleep disordered breathing.   I will likely see you back after your sleep study to go over the test results and where to go from there. We will call you after your sleep study to advise about the results (most likely, you will hear from Kristen, my nurse) and to set up an appointment at the time, as necessary.    Our sleep lab administrative assistant will call you to schedule your sleep study and give you further instructions, regarding the check in process for the sleep study, arrival time, what to bring, when you can expect to leave after the study, etc., and to answer any other logistical questions you may have. If you don't hear back from her by about 2 weeks from now, please feel free to call her direct line at [phone] or you can call our general clinic number, or email us through My Chart.   

## 2019-06-24 ENCOUNTER — Telehealth: Payer: Self-pay | Admitting: Neurology

## 2019-06-24 NOTE — Telephone Encounter (Signed)
She can speed up the titration:   Go up to 3 pills now and after 3 days go up to 4

## 2019-06-24 NOTE — Telephone Encounter (Signed)
Pt called to report her Topmax 25 mg tapering dosage has not been beneficial in treating her h/a. She reports daily h/a and extreme light/noise sensitivity. She will start her 3 tabs nightly next week. Pt feels she cannot wait for the h/a to be treated with this med and wanted to know if another or additional medication could be sent to help? Pt was advised Dr. Anne Hahn is out of the office this week and I would fwd to [facility] to review during his absence. Pt was agreeable.

## 2019-06-24 NOTE — Telephone Encounter (Signed)
I contacted the pt and left a vm ( ok per dpr) advising of Dr. Ledon Snare recommendation. Pt was advised to call me back tomorrow if she had any questions.

## 2019-06-24 NOTE — Telephone Encounter (Signed)
Pt called stating the topiramate (TOPAMAX) 25 MG tablet Is not working for her and her head still hurts. Pt states she has tried taking Excedrin as well but it didn't work for her either. Pt requested a CB from RN to discuss

## 2019-06-25 ENCOUNTER — Other Ambulatory Visit: Payer: Self-pay | Admitting: Physician Assistant

## 2019-06-30 DIAGNOSIS — M170 Bilateral primary osteoarthritis of knee: Secondary | ICD-10-CM | POA: Diagnosis not present

## 2019-07-07 ENCOUNTER — Ambulatory Visit (INDEPENDENT_AMBULATORY_CARE_PROVIDER_SITE_OTHER): Payer: Medicare HMO | Admitting: Cardiovascular Disease

## 2019-07-07 ENCOUNTER — Other Ambulatory Visit: Payer: Self-pay

## 2019-07-07 ENCOUNTER — Encounter: Payer: Self-pay | Admitting: Cardiovascular Disease

## 2019-07-07 VITALS — BP 128/84 | HR 71 | Temp 97.0°F | Ht 65.0 in | Wt 270.0 lb

## 2019-07-07 DIAGNOSIS — I779 Disorder of arteries and arterioles, unspecified: Secondary | ICD-10-CM | POA: Diagnosis not present

## 2019-07-07 DIAGNOSIS — E785 Hyperlipidemia, unspecified: Secondary | ICD-10-CM

## 2019-07-07 DIAGNOSIS — I739 Peripheral vascular disease, unspecified: Secondary | ICD-10-CM

## 2019-07-07 DIAGNOSIS — I10 Essential (primary) hypertension: Secondary | ICD-10-CM | POA: Diagnosis not present

## 2019-07-07 DIAGNOSIS — I2510 Atherosclerotic heart disease of native coronary artery without angina pectoris: Secondary | ICD-10-CM | POA: Diagnosis not present

## 2019-07-07 MED ORDER — CILOSTAZOL 50 MG PO TABS: 50.0000 mg | ORAL_TABLET | Freq: Two times a day (BID) | ORAL | 1 refills | Status: DC

## 2019-07-07 NOTE — Patient Instructions (Signed)
Medication Instructions:  Start Pletal 50 mg twice daily   *If you need a refill on your cardiac medications before your next appointment, please call your pharmacy*    Follow-Up: At [facility], you and your health needs are our priority.  As part of our continuing mission to provide you with exceptional heart care, we have created designated Provider Care Teams.  These Care Teams include your primary Cardiologist (physician) and Advanced Practice Providers (APPs -  Physician Assistants and Nurse Practitioners) who all work together to provide you with the care you need, when you need it.  We recommend signing up for the patient portal called "MyChart".  Sign up information is provided on this After Visit Summary.  MyChart is used to connect with patients for Virtual Visits (Telemedicine).  Patients are able to view lab/test results, encounter notes, upcoming appointments, etc.  Non-urgent messages can be sent to your provider as well.   To learn more about what you can do with MyChart, go to [email or web address].    Your next appointment:   4 month(s)  The format for your next appointment:   In Person  Provider:   Lorine Bears, MD
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
## 2019-07-09 ENCOUNTER — Telehealth: Payer: Self-pay

## 2019-07-09 NOTE — Telephone Encounter (Signed)
LVM for pt to call me back to schedule sleep study  

## 2019-07-13 ENCOUNTER — Other Ambulatory Visit: Payer: Self-pay

## 2019-07-13 ENCOUNTER — Other Ambulatory Visit: Payer: Medicare HMO

## 2019-07-13 DIAGNOSIS — I10 Essential (primary) hypertension: Secondary | ICD-10-CM

## 2019-07-13 DIAGNOSIS — I2510 Atherosclerotic heart disease of native coronary artery without angina pectoris: Secondary | ICD-10-CM

## 2019-07-13 DIAGNOSIS — E785 Hyperlipidemia, unspecified: Secondary | ICD-10-CM

## 2019-07-13 DIAGNOSIS — I6523 Occlusion and stenosis of bilateral carotid arteries: Secondary | ICD-10-CM | POA: Diagnosis not present

## 2019-07-13 DIAGNOSIS — I739 Peripheral vascular disease, unspecified: Secondary | ICD-10-CM | POA: Diagnosis not present

## 2019-07-13 LAB — LIPID PANEL
Chol/HDL Ratio: 2.3 ratio (ref 0.0–4.4)
Cholesterol, Total: 170 mg/dL (ref 100–199)
HDL: 74 mg/dL (ref >39)
LDL Chol Calc (NIH): 82 mg/dL (ref 0–99)
Triglycerides: 77 mg/dL (ref 0–149)
VLDL Cholesterol Cal: 14 mg/dL (ref 5–40)

## 2019-07-13 LAB — HEPATIC FUNCTION PANEL
ALT: 26 IU/L (ref 0–32)
AST: 27 IU/L (ref 0–40)
Albumin: 4.4 g/dL (ref 3.8–4.8)
Alkaline Phosphatase: 104 IU/L (ref 48–121)
Bilirubin Total: 0.6 mg/dL (ref 0.0–1.2)
Bilirubin, Direct: 0.22 mg/dL (ref 0.00–0.40)
Total Protein: 7.3 g/dL (ref 6.0–8.5)

## 2019-07-14 ENCOUNTER — Telehealth: Payer: Self-pay | Admitting: Physician Assistant

## 2019-07-14 DIAGNOSIS — E785 Hyperlipidemia, unspecified: Secondary | ICD-10-CM

## 2019-07-14 NOTE — Telephone Encounter (Signed)
-----   Message from Laurann Montana, PA-C sent at 07/14/2019  9:23 AM EDT ----- LFTs normal but LDL remains above goal. Please refer to lipid clinic to consider Zetia vs PCSK9 injections. Thanks.

## 2019-07-14 NOTE — Telephone Encounter (Signed)
New message ° ° °Patient is returning call for lab results. Please call. °

## 2019-07-14 NOTE — Telephone Encounter (Signed)
Returned call to pt and she has been made aware of her lab results and recommendations.  She has been referred to lipid clinic.

## 2019-07-15 DIAGNOSIS — Z1159 Encounter for screening for other viral diseases: Secondary | ICD-10-CM | POA: Diagnosis not present

## 2019-07-15 DIAGNOSIS — E039 Hypothyroidism, unspecified: Secondary | ICD-10-CM | POA: Diagnosis not present

## 2019-07-15 DIAGNOSIS — Z0000 Encounter for general adult medical examination without abnormal findings: Secondary | ICD-10-CM | POA: Diagnosis not present

## 2019-07-15 DIAGNOSIS — I679 Cerebrovascular disease, unspecified: Secondary | ICD-10-CM | POA: Diagnosis not present

## 2019-07-15 DIAGNOSIS — Z23 Encounter for immunization: Secondary | ICD-10-CM | POA: Diagnosis not present

## 2019-07-15 DIAGNOSIS — Z79899 Other long term (current) drug therapy: Secondary | ICD-10-CM | POA: Diagnosis not present

## 2019-07-15 DIAGNOSIS — E7800 Pure hypercholesterolemia, unspecified: Secondary | ICD-10-CM | POA: Diagnosis not present

## 2019-07-15 DIAGNOSIS — E559 Vitamin D deficiency, unspecified: Secondary | ICD-10-CM | POA: Diagnosis not present

## 2019-07-15 DIAGNOSIS — I10 Essential (primary) hypertension: Secondary | ICD-10-CM | POA: Diagnosis not present

## 2019-07-15 DIAGNOSIS — M4726 Other spondylosis with radiculopathy, lumbar region: Secondary | ICD-10-CM | POA: Diagnosis not present

## 2019-07-15 DIAGNOSIS — E1121 Type 2 diabetes mellitus with diabetic nephropathy: Secondary | ICD-10-CM | POA: Diagnosis not present

## 2019-07-15 DIAGNOSIS — N182 Chronic kidney disease, stage 2 (mild): Secondary | ICD-10-CM | POA: Diagnosis not present

## 2019-07-20 ENCOUNTER — Other Ambulatory Visit: Payer: Self-pay

## 2019-07-20 ENCOUNTER — Ambulatory Visit (INDEPENDENT_AMBULATORY_CARE_PROVIDER_SITE_OTHER): Payer: Medicare HMO | Admitting: Pharmacist

## 2019-07-20 DIAGNOSIS — I739 Peripheral vascular disease, unspecified: Secondary | ICD-10-CM | POA: Diagnosis not present

## 2019-07-20 DIAGNOSIS — E785 Hyperlipidemia, unspecified: Secondary | ICD-10-CM | POA: Diagnosis not present

## 2019-07-20 NOTE — Progress Notes (Signed)
Patient ID: [name]                 DOB: 1955/11/19                    MRN: [id number]     HPI: [name] is a 64 y.o. female patient of Dr. Fletcher Anon and Dr. Johnsie Cancel referred to lipid clinic by Melina Copa. PMH is significant for coronary artery disease managed medically, carotid artery disease status post left carotid endarterectomy, Jehovah's Witness, anxiety, diabetes mellitus, essential hypertension, hyperlipidemia, hypothyroidism, previous stroke, GERD, prolonged QT interval and PAD.  Patient recently saw Dr. Fletcher Anon for PAD. Wound to have severe bilateral calf claudication. LDL 82 on rosuvastatin 71m daily. Patient referred to lipid clinic to discuss zetia vs PCSK9i.   Patient presents today in good spirits. She is very excited that she walked to her mailbox this Saturday without pain. She is states she doesn't have much appetite for food. Only eats dinner. Mostly chicken. Has lost weight. She is on an GLP-1. Eats [place] twice a week. Doesn't eat the rice, but does eat the noodles. He daughter bought her boots that massage and heat her legs. Help with the pain.  Current Medications: rosuvastatin 490mdaily Intolerances:  Risk Factors: CAD,PAD, CVA, DM LDL goal: <70 (or <55 per AACE/ACE guidelines)  Diet: breakfast: doesn't eat breakfast Lunch: doesn't eat lunch Dinner:chicken, mixed vegetables, dujorno pizza, fish (chineese food twice a week) Gatorade (zero calorie), coffee (sometimes), water  Exercise: has started walking some  Family History:The patient's family history includes Asthma in an other family member; CAD in her brother and sister; Cancer in her sister; Diabetes in her brother, mother, and sister; Heart attack in her brother; Heart disease in her brother, father, mother, and sister; Hyperlipidemia in her brother, father, mother, and sister; Hypertension in her brother, daughter, father, mother, sister, and another family member; Stroke in an other family member.      Social History: The patient  reports that she quit smoking about 18 years ago. Her smoking use included cigarettes. She has a 7.25 pack-year smoking history. She has never used smokeless tobacco. She reports previous alcohol use of about 2.0 standard drinks of alcohol per week. She reports that she does not use drugs.   Labs: 07/13/19 TC 170, TG 77, HDL 74, LDL 82  Past Medical History:  Diagnosis Date  . Acid reflux   . Anxiety   . ARF (acute renal failure) ([facility]/09[place]  . Arthritis   . Asthma   . CAD (coronary artery disease)    a. Cath 2015 - moderate LAD, diagonal, and OM disease with 100% dRCA with L-R collaterals. b. 2019 nuc normal.  . Carotid artery occlusion   . Chronic pain following surgery or procedure 2008   Abdominal pain  . Common migraine with intractable migraine 06/04/2019  . Dehydration 02/09/2012  . Diabetes mellitus   . Diabetic hyperosmolar non-ketotic state ([facility]/05/2014  . Headache(784.0)   . Hyperlipidemia   . Hypertension    stress test- scheduled for 12/30/2012  . Hypothyroidism   . Left carotid bruit 12/15/2012  . Myocardial infarction ([facility]  . Pleuritic chest pain 12/14/2011  . Prolonged QT interval   . Pulmonary nodules/lesions, multiple 06/05/2019  . SBO (small bowel obstruction) ([facility]/04/2012  . Shortness of breath   . Stroke ([facility]/2014   tingling in L arm , slurred speech- came to ER    Current Outpatient  Medications on File Prior to Visit  Medication Sig Dispense Refill  . ACCU-CHEK GUIDE test strip     . Accu-Chek Softclix Lancets lancets     . albuterol (PROVENTIL) (2.5 MG/3ML) 0.083% nebulizer solution Take 3 mLs (2.5 mg total) by nebulization every 6 (six) hours as needed for wheezing or shortness of breath. 75 mL 0  . amLODipine (NORVASC) 10 MG tablet Take 10 mg by mouth.    Marland Kitchen aspirin EC 81 MG tablet Take 81 mg by mouth daily.    Marland Kitchen atenolol-chlorthalidone (TENORETIC) 100-25 MG per tablet Take 1 tablet by mouth daily.    .  Blood Glucose Monitoring Suppl (ACCU-CHEK GUIDE) w/Device KIT     . cilostazol (PLETAL) 50 MG tablet Take 1 tablet (50 mg total) by mouth 2 (two) times daily. 180 tablet 1  . diclofenac Sodium (VOLTAREN) 1 % GEL Apply 2 g topically 2 (two) times daily.    . diphenhydrAMINE (BENADRYL) 25 MG tablet Take 25 mg by mouth as directed.    . insulin aspart protamine - aspart (NOVOLOG MIX 70/30 FLEXPEN) (70-30) 100 UNIT/ML FlexPen Inject 20-30 Units into the skin See admin instructions. 30 units in the morning and 20 units at night.     Marland Kitchen ipratropium-albuterol (DUONEB) 0.5-2.5 (3) MG/3ML SOLN Take 3 mLs by nebulization every 4 (four) hours as needed. 6 mL 0  . KLOR-CON M10 10 MEQ tablet TAKE 3 TABLETS (30 MEQ TOTAL) BY MOUTH 2 (TWO) TIMES DAILY. 180 tablet 1  . levothyroxine (SYNTHROID) 175 MCG tablet Take 175 mcg by mouth daily before breakfast.    . NOVOLOG 100 UNIT/ML injection Inject 10 Units into the skin 3 (three) times daily.  12  . predniSONE (DELTASONE) 20 MG tablet prednisone 20 mg tablet  PLEASE SEE ATTACHED FOR DETAILED DIRECTIONS    . PROAIR HFA 108 (90 Base) MCG/ACT inhaler Inhale 2-3 puffs into the lungs every 6 (six) hours as needed for shortness of breath. 18 g 0  . rosuvastatin (CRESTOR) 40 MG tablet Take 1 tablet (40 mg total) by mouth daily. 90 tablet 3  . topiramate (TOPAMAX) 25 MG tablet Take one tablet at night for one week, then take 2 tablets at night for one week, then take 3 tablets at night. 90 tablet 3  . zolpidem (AMBIEN) 10 MG tablet Take 10 mg by mouth at bedtime.      No current facility-administered medications on file prior to visit.    Allergies  Allergen Reactions  . Strawberry Extract Anaphylaxis  . Cymbalta [Duloxetine Hcl] Other (See Comments)    Dizzy   . Lisinopril Itching, Swelling and Other (See Comments)    Angioedema   . Lunesta [Eszopiclone] Other (See Comments)    Bad taste in mouth   . Metformin And Related Other (See Comments)    Damaged  patient's kidneys  . Other Other (See Comments)    Pt is a Jehovah Witness. No blood products.  . Sulfa Antibiotics Itching, Swelling and Other (See Comments)    Angioedema   . Tramadol Other (See Comments)    Hallucinations   . Trazodone And Nefazodone Other (See Comments)    Shakes   . Penicillins Rash and Other (See Comments)    "Paralyzed" (per patient) Has patient had a PCN reaction causing immediate rash, facial/tongue/throat swelling, SOB or lightheadedness with hypotension: Yes Has patient had a PCN reaction causing severe rash involving mucus membranes or skin necrosis: No Has patient had a PCN reaction that required  hospitalization Yes Has patient had a PCN reaction occurring within the last 10 years: No If all of the above answers are "NO", then may proceed with Cephalosporin use.     Assessment/Plan:  1. Hyperlipidemia - LDL is above goal of <55. Patient and I had a long discussion on the development and progression of arthrosclerosis. Discussed controlling risk factors such as LDL, BP and blood sugar. Discussed PCSK9i vs zetia. Patient is agreeable to PCSK9i. Will submit prior authorization for PCSK9i. Patient is dual medicare/medicaid eligible. Encouraged patient to eat a low carb diet, limit chinese food and to increase her walking. I congratulated her on walking to her mailbox and encouraged her to increase her walking as tolerated. Stopping when she has pain, allowing to resolve and then walking again. I will call patient once PCSK9i approved. Stressed that she is to continue rosuvastatin.   Thank you,  Ramond Dial, Pharm.D, BCPS, CPP [place]  [address] [place], [place] [address]  Phone: [phone]; Fax: [phone]

## 2019-07-20 NOTE — Patient Instructions (Signed)
It was a pleasure to meet you!  I will submit a prior authorization for Repatha.  I will call you once its approved  Please increase your walking as tolerated. Goal would be 30 min walk, 5 days a week  Please limit your chinese food intake.  Call us at [phone] with any questions

## 2019-07-21 ENCOUNTER — Telehealth: Payer: Self-pay | Admitting: Pharmacist

## 2019-07-21 DIAGNOSIS — E785 Hyperlipidemia, unspecified: Secondary | ICD-10-CM

## 2019-07-21 MED ORDER — REPATHA SURECLICK 140 MG/ML ~~LOC~~ SOAJ: 1.0000 "pen " | SUBCUTANEOUS | 11 refills | Status: DC

## 2019-07-21 NOTE — Telephone Encounter (Signed)
Prior auth approved through 01/16/20. Patient made aware. Rx sent to [facility] and labs scheduled for 9/7

## 2019-07-26 ENCOUNTER — Encounter: Payer: Self-pay | Admitting: Physician Assistant
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
## 2019-07-27 ENCOUNTER — Telehealth (INDEPENDENT_AMBULATORY_CARE_PROVIDER_SITE_OTHER): Payer: Medicare HMO | Admitting: Physician Assistant

## 2019-07-27 ENCOUNTER — Encounter: Payer: Self-pay | Admitting: Physician Assistant

## 2019-07-27 ENCOUNTER — Other Ambulatory Visit: Payer: Self-pay

## 2019-07-27 VITALS — BP 123/70 | HR 71 | Ht 65.0 in | Wt 269.0 lb

## 2019-07-27 DIAGNOSIS — I6523 Occlusion and stenosis of bilateral carotid arteries: Secondary | ICD-10-CM | POA: Diagnosis not present

## 2019-07-27 DIAGNOSIS — I739 Peripheral vascular disease, unspecified: Secondary | ICD-10-CM

## 2019-07-27 DIAGNOSIS — I2510 Atherosclerotic heart disease of native coronary artery without angina pectoris: Secondary | ICD-10-CM | POA: Diagnosis not present

## 2019-07-27 DIAGNOSIS — E785 Hyperlipidemia, unspecified: Secondary | ICD-10-CM | POA: Diagnosis not present

## 2019-07-27 DIAGNOSIS — R9431 Abnormal electrocardiogram [ECG] [EKG]: Secondary | ICD-10-CM | POA: Diagnosis not present

## 2019-07-27 NOTE — Patient Instructions (Addendum)
Medication Instructions:  Your physician recommends that you continue on your current medications as directed. Please refer to the Current Medication list given to you today.  *If you need a refill on your cardiac medications before your next appointment, please call your pharmacy*   Lab Work: 07/30/19 COME TO THE OFFICE FOR LAB WORK ANYTIME BETWEEN 7:30 AND 4:30.  If you have labs (blood work) drawn today and your tests are completely normal, you will receive your results only by: Marland Kitchen MyChart Message (if you have MyChart) OR . A paper copy in the mail If you have any lab test that is abnormal or we need to change your treatment, we will call you to review the results.   Testing/Procedures: None ordered   Follow-Up: At [facility], you and your health needs are our priority.  As part of our continuing mission to provide you with exceptional heart care, we have created designated Provider Care Teams.  These Care Teams include your primary Cardiologist (physician) and Advanced Practice Providers (APPs -  Physician Assistants and Nurse Practitioners) who all work together to provide you with the care you need, when you need it.  We recommend signing up for the patient portal called "MyChart".  Sign up information is provided on this After Visit Summary.  MyChart is used to connect with patients for Virtual Visits (Telemedicine).  Patients are able to view lab/test results, encounter notes, upcoming appointments, etc.  Non-urgent messages can be sent to your provider as well.   To learn more about what you can do with MyChart, go to [email or web address].    Your next appointment:   6 month(s)  The format for your next appointment:   In Person  Provider:   You may see Ronie Spies, PA-C or one of the following Advanced Practice Providers on your designated Care Team:       Other Instructions

## 2019-07-28 DIAGNOSIS — Z0000 Encounter for general adult medical examination without abnormal findings: Secondary | ICD-10-CM | POA: Diagnosis not present

## 2019-07-29 ENCOUNTER — Other Ambulatory Visit: Payer: Self-pay

## 2019-07-29 ENCOUNTER — Ambulatory Visit (INDEPENDENT_AMBULATORY_CARE_PROVIDER_SITE_OTHER): Payer: Medicare HMO | Admitting: Neurology

## 2019-07-29 DIAGNOSIS — G4733 Obstructive sleep apnea (adult) (pediatric): Secondary | ICD-10-CM | POA: Diagnosis not present

## 2019-07-29 DIAGNOSIS — G4720 Circadian rhythm sleep disorder, unspecified type: Secondary | ICD-10-CM

## 2019-07-29 DIAGNOSIS — R351 Nocturia: Secondary | ICD-10-CM

## 2019-07-29 DIAGNOSIS — I6529 Occlusion and stenosis of unspecified carotid artery: Secondary | ICD-10-CM

## 2019-07-29 DIAGNOSIS — R519 Headache, unspecified: Secondary | ICD-10-CM

## 2019-07-29 DIAGNOSIS — Z6841 Body Mass Index (BMI) 40.0 and over, adult: Secondary | ICD-10-CM

## 2019-07-30 ENCOUNTER — Other Ambulatory Visit: Payer: Medicare HMO

## 2019-07-30 DIAGNOSIS — R9431 Abnormal electrocardiogram [ECG] [EKG]: Secondary | ICD-10-CM | POA: Diagnosis not present

## 2019-07-31 ENCOUNTER — Telehealth: Payer: Self-pay | Admitting: *Deleted

## 2019-07-31 DIAGNOSIS — Z79899 Other long term (current) drug therapy: Secondary | ICD-10-CM

## 2019-07-31 LAB — BASIC METABOLIC PANEL
BUN/Creatinine Ratio: 14 (ref 12–28)
BUN: 12 mg/dL (ref 8–27)
CO2: 24 mmol/L (ref 20–29)
Calcium: 9.6 mg/dL (ref 8.7–10.3)
Chloride: 98 mmol/L (ref 96–106)
Creatinine, Ser: 0.88 mg/dL (ref 0.57–1.00)
GFR calc Af Amer: 80 mL/min/{1.73_m2} (ref >59)
GFR calc non Af Amer: 69 mL/min/{1.73_m2} (ref >59)
Glucose: 167 mg/dL — ABNORMAL HIGH (ref 65–99)
Potassium: 3.2 mmol/L — ABNORMAL LOW (ref 3.5–5.2)
Sodium: 138 mmol/L (ref 134–144)

## 2019-07-31 LAB — MAGNESIUM: Magnesium: 2.1 mg/dL (ref 1.6–2.3)

## 2019-07-31 MED ORDER — POTASSIUM CHLORIDE ER 10 MEQ PO TBCR
40.0000 meq | EXTENDED_RELEASE_TABLET | Freq: Three times a day (TID) | ORAL | 3 refills | Status: DC
Start: 2019-07-31 — End: 2019-08-04

## 2019-07-31 NOTE — Telephone Encounter (Signed)
-----   Message from Laurann Montana, [place] sent at 07/31/2019 11:07 AM EDT ----- Please let pt know her potassium level remains low. Per our recent discussion she was taking BID. Please confirm this is the case. If so, would increase KCl to TID (breakfast, lunch, dinner) and recheck BMET on Monday or Tuesday. I think her low potassium is from her chlorthalidone but since she's been doing well on her current med regimen I otherwise wouldn't make any changes aside from supplementing potassium. Would also increase dietary intake of healthy sources of potassium including bananas, squash, yogurt, white beans, sweet potatoes, leafy greens, and avocados.  Dayna Dunn PA-C

## 2019-08-03 ENCOUNTER — Other Ambulatory Visit: Payer: Medicare HMO | Admitting: *Deleted

## 2019-08-03 ENCOUNTER — Other Ambulatory Visit: Payer: Self-pay

## 2019-08-03 ENCOUNTER — Telehealth: Payer: Self-pay | Admitting: Physician Assistant

## 2019-08-03 DIAGNOSIS — I2510 Atherosclerotic heart disease of native coronary artery without angina pectoris: Secondary | ICD-10-CM | POA: Diagnosis not present

## 2019-08-03 DIAGNOSIS — I10 Essential (primary) hypertension: Secondary | ICD-10-CM | POA: Diagnosis not present

## 2019-08-03 DIAGNOSIS — E876 Hypokalemia: Secondary | ICD-10-CM

## 2019-08-03 DIAGNOSIS — Z79899 Other long term (current) drug therapy: Secondary | ICD-10-CM

## 2019-08-03 LAB — COMPREHENSIVE METABOLIC PANEL
ALT: 27 IU/L (ref 0–32)
AST: 29 IU/L (ref 0–40)
Albumin/Globulin Ratio: 1.3 (ref 1.2–2.2)
Albumin: 4.3 g/dL (ref 3.8–4.8)
Alkaline Phosphatase: 101 IU/L (ref 48–121)
BUN/Creatinine Ratio: 11 — ABNORMAL LOW (ref 12–28)
BUN: 11 mg/dL (ref 8–27)
Bilirubin Total: 0.7 mg/dL (ref 0.0–1.2)
CO2: 24 mmol/L (ref 20–29)
Calcium: 9.5 mg/dL (ref 8.7–10.3)
Chloride: 97 mmol/L (ref 96–106)
Creatinine, Ser: 1.01 mg/dL — ABNORMAL HIGH (ref 0.57–1.00)
GFR calc Af Amer: 68 mL/min/{1.73_m2} (ref >59)
GFR calc non Af Amer: 59 mL/min/{1.73_m2} — ABNORMAL LOW (ref >59)
Globulin, Total: 3.3 g/dL (ref 1.5–4.5)
Glucose: 149 mg/dL — ABNORMAL HIGH (ref 65–99)
Potassium: 3.3 mmol/L — ABNORMAL LOW (ref 3.5–5.2)
Sodium: 139 mmol/L (ref 134–144)
Total Protein: 7.6 g/dL (ref 6.0–8.5)

## 2019-08-03 MED ORDER — ATENOLOL 100 MG PO TABS
100.0000 mg | ORAL_TABLET | Freq: Every day | ORAL | 6 refills | Status: DC
Start: 2019-08-03 — End: 2019-08-27

## 2019-08-03 NOTE — Telephone Encounter (Signed)
   Labs reviewed from today. Although BMET order was in, somehow lab drew CMET - please route to lab.  She remains hypokalemic at 3.3 despite aggressive supplementation with potassium chloride TID. She affirms she is taking this as prescribed. Historically she appears to have had a tendency for intermittently low potassium level even back to 2014. Mag level was normal. Of note, prior aldosterone level reported to be normal. She has also been normotensive in most recent OVs.  At this point I think we need a trial off chlorthalidone to see if her hypokalemia improves, with an eye towards starting spironolactone. I'd prefer to do this once we know potassium level has normalized, to avoid confusion or potential hyperkalemia. I called [name] to discuss further instructions. She takes her chlorthalidone in the form of atenolol/chlorthalidone combo. She took this today, as well as 2 doses of her KCl already.  Per our discussion she will enact the following - no further atenolol/chlorthalidone - start atenolol 100mg  daily tomorrow AM (rx sent in) - take KCl tonight as scheduled, then drop down to BID tomorrow & Wednesday, then needs BMET on Thursday without potassium at that time - please obtain in AM/stat so that we can act on results that afternoon - I did tell her she may have slightly elevated BP during this time which is why it's important to pick up the atenolol to keep the beta blocker going - if Cr and K are stable on Thursday, plan to initiate spironolactone 25mg  daily at that time (potentially off potassium altogether) with close lab follow-up  Patient verbalized understanding of plan with teachback method. I will route to Friday to help arrange the BMET on Thursday. Amanda Nonaka PA-C

## 2019-08-04 ENCOUNTER — Encounter: Payer: Self-pay | Admitting: *Deleted

## 2019-08-04 MED ORDER — POTASSIUM CHLORIDE ER 10 MEQ PO TBCR
20.0000 meq | EXTENDED_RELEASE_TABLET | Freq: Two times a day (BID) | ORAL | 3 refills | Status: DC
Start: 2019-08-04 — End: 2019-08-11

## 2019-08-06 ENCOUNTER — Other Ambulatory Visit: Payer: Self-pay

## 2019-08-06 ENCOUNTER — Other Ambulatory Visit: Payer: Medicare HMO | Admitting: *Deleted

## 2019-08-06 DIAGNOSIS — Z79899 Other long term (current) drug therapy: Secondary | ICD-10-CM | POA: Diagnosis not present

## 2019-08-06 DIAGNOSIS — E876 Hypokalemia: Secondary | ICD-10-CM

## 2019-08-06 LAB — BASIC METABOLIC PANEL
BUN/Creatinine Ratio: 13 (ref 12–28)
BUN: 11 mg/dL (ref 8–27)
CO2: 27 mmol/L (ref 20–29)
Calcium: 9.8 mg/dL (ref 8.7–10.3)
Chloride: 101 mmol/L (ref 96–106)
Creatinine, Ser: 0.86 mg/dL (ref 0.57–1.00)
GFR calc Af Amer: 82 mL/min/{1.73_m2} (ref >59)
GFR calc non Af Amer: 71 mL/min/{1.73_m2} (ref >59)
Glucose: 126 mg/dL — ABNORMAL HIGH (ref 65–99)
Potassium: 3.1 mmol/L — ABNORMAL LOW (ref 3.5–5.2)
Sodium: 139 mmol/L (ref 134–144)

## 2019-08-07 ENCOUNTER — Telehealth: Payer: Self-pay

## 2019-08-07 DIAGNOSIS — Z79899 Other long term (current) drug therapy: Secondary | ICD-10-CM

## 2019-08-07 DIAGNOSIS — E876 Hypokalemia: Secondary | ICD-10-CM

## 2019-08-07 NOTE — Telephone Encounter (Signed)
-----   Message from Laurann Montana, [place] sent at 08/06/2019  5:36 PM EDT ----- Sending to triage because it looks like Victorino Dike is out. Potassium remains low, may still be seeing some effect from her chlorthalidone we stopped (last dose Monday). Prior phone note outlines the changes we made the other day. She was doing KCl BID at time these labs were obtained. I called Ms. Fischbach this evening and instructed her to take tonight, increase to BID tomorrow, then BID thereafter - needs repeat BMET and also add renin/aldosterone level on either Monday or Tuesday. I told her the office would call her to arrange this. Thanks!

## 2019-08-07 NOTE — Telephone Encounter (Signed)
Spoke with the patient and reviewed instructions for taking KCl per Dayna. Patient verbalized understanding. She will come in on Tuesday for repeat BMET plus aldosterone/renin

## 2019-08-11 ENCOUNTER — Other Ambulatory Visit: Payer: Self-pay

## 2019-08-11 ENCOUNTER — Other Ambulatory Visit: Payer: Self-pay | Admitting: Physician Assistant

## 2019-08-11 ENCOUNTER — Other Ambulatory Visit: Payer: Medicare HMO

## 2019-08-11 DIAGNOSIS — E876 Hypokalemia: Secondary | ICD-10-CM | POA: Diagnosis not present

## 2019-08-11 DIAGNOSIS — Z79899 Other long term (current) drug therapy: Secondary | ICD-10-CM | POA: Diagnosis not present

## 2019-08-11 MED ORDER — SPIRONOLACTONE 25 MG PO TABS
25.0000 mg | ORAL_TABLET | Freq: Every day | ORAL | 3 refills | Status: DC
Start: 2019-08-11 — End: 2019-11-04

## 2019-08-12 ENCOUNTER — Telehealth: Payer: Self-pay | Admitting: *Deleted

## 2019-08-12 DIAGNOSIS — Z79899 Other long term (current) drug therapy: Secondary | ICD-10-CM

## 2019-08-12 NOTE — Telephone Encounter (Signed)
-----   Message from Laurann Montana, [place] sent at 08/11/2019  5:19 PM EDT ----- Spoke with patient - great news - potassium has normalized to 4.2 off chlorthalidone (with continued KCl supplementation). At this point I think we can trial her off KCl, and onto spironolactone 25mg  daily - I will send in rx. (As expected, BP running slightly higher off chlorthalidone at around 150 systolic this morning per patient.) Will route to MA to arrange BMET in 1 week for recheck at which time we'll touch base with her about her BP so please ask her to keep checking it daily, but do so about 3-4 hours after her AM meds. Will await renin/aldosterone level but I am still suspicious the low K level was all due to the chlorthalidone (hopefully). Dayna Dunn PA-C

## 2019-08-13 ENCOUNTER — Telehealth: Payer: Self-pay

## 2019-08-13 NOTE — Telephone Encounter (Signed)
-----   Message from [name], MD sent at 08/13/2019  7:23 AM EDT ----- Patient referred by Dr. Anne Hahn, seen by me on 06/16/19, diagnostic PSG on 07/29/19.   Please call and notify the patient that the recent sleep study showed severe obstructive sleep apnea (total AHI of 39.8/hour, REM AHI of 48.8/hour, supine AHI of 58.6/hour and O2 nadir of 80%). I recommend treatment for this in the form of CPAP. This will require a repeat sleep study for proper titration and mask fitting and correct monitoring of the oxygen saturations. Please explain to patient. I have placed an order in the chart. Thanks.  [name], MD, PhD [facility])

## 2019-08-13 NOTE — Telephone Encounter (Signed)
I called pt. I advised pt that Dr. Athar reviewed their sleep study results and found that severe osa and recommends that pt be treated with a cpap. Dr. Athar recommends that pt return for a repeat sleep study in order to properly titrate the cpap and ensure a good mask fit. Pt is agreeable to returning for a titration study. I advised pt that our sleep lab will file with pt's insurance and call pt to schedule the sleep study when we hear back from the pt's insurance regarding coverage of this sleep study. Pt verbalized understanding of results. Pt had no questions at this time but was encouraged to call back if questions arise.   

## 2019-08-13 NOTE — Procedures (Signed)
PATIENT'S NAME:  [name], [name] DOB:      08/11/1955      MR#:    [id number]     DATE OF RECORDING: 07/29/2019 REFERRING M.D.:  Stephanie Acre, MD Study Performed:   Baseline Polysomnogram HISTORY: 64 year old woman with a history of hypertension, hyperlipidemia, diabetes, stroke, carotid artery occlusion, coronary artery disease with history of MI,, asthma, arthritis, anxiety, reflux disease, and morbid obesity with a BMI of over 45, who reports sleep disruption, non-restorative sleep, morning headaches and significant nocturia. The patient endorsed the Epworth Sleepiness Scale at 1 points. The patient's weight 273 pounds with a height of 65 (inches), resulting in a BMI of 45.5 kg/m2. The patient's neck circumference measured 17 inches.  CURRENT MEDICATIONS: Proventil, Norvasc, Aspirin, Tenoretic, Voltaren, Benadryl, Novolog, Duoneb, Synthroid, Klor-Con, ProAir, Crestor, Ambien   PROCEDURE:  This is a multichannel digital polysomnogram utilizing the Somnostar 11.2 system.  Electrodes and sensors were applied and monitored per AASM Specifications.   EEG, EOG, Chin and Limb EMG, were sampled at 200 Hz.  ECG, Snore and Nasal Pressure, Thermal Airflow, Respiratory Effort, CPAP Flow and Pressure, Oximetry was sampled at 50 Hz. Digital video and audio were recorded.      BASELINE STUDY  Lights Out was at 21:37 and Lights On at 04:49.  Total recording time (TRT) was 433 minutes, with a total sleep time (TST) of 301.5 minutes.   The patient's sleep latency to persistent sleep was 45.5 minutes, which is delayed.  REM latency was 248 minutes, which is markedly delayed. The sleep efficiency was 69.6%, which is reduced.     SLEEP ARCHITECTURE: WASO (Wake after sleep onset) was 110 minutes with mild to moderate sleep fragmentation noted.  There were 37.5 minutes in Stage N1, 181.5 minutes Stage N2, 66.5 minutes Stage N3 and 16 minutes in Stage REM.  The percentage of Stage N1 was 12.4%, which is markedly increased,  Stage N2 was 60.2%, which is increased, Stage N3 was 22.1% and Stage R (REM sleep) was 5.3%, which is markedly reduced. The arousals were noted as: 62 were spontaneous, 0 were associated with PLMs, 115 were associated with respiratory events.  RESPIRATORY ANALYSIS:  There were a total of 199 respiratory events:  81 obstructive apneas, 11 central apneas and 0 mixed apneas with a total of 92 apneas and an apnea index (AI) of 18.3 /hour. There were 107 hypopneas with a hypopnea index of 21.3 /hour. The patient also had 0 respiratory event related arousals (RERAs).      The total APNEA/HYPOPNEA INDEX (AHI) was 39.6/hour and the total RESPIRATORY DISTURBANCE INDEX was  39.6 /hour.  13 events occurred in REM sleep and 211 events in NREM. The REM AHI was  48.8 /hour, versus a non-REM AHI of 39.1. The patient spent 85 minutes of total sleep time in the supine position and 217 minutes in non-supine.. The supine AHI was 58.6 versus a non-supine AHI of 32.1.  OXYGEN SATURATION & C02:  The Wake baseline 02 saturation was 94%, with the lowest being 80%. Time spent below 89% saturation equaled 126 minutes.  PERIODIC LIMB MOVEMENTS: The patient had a total of 0 Periodic Limb Movements.  The Periodic Limb Movement (PLM) index was 0 and the PLM Arousal index was 0/hour.  Audio and video analysis did not show any abnormal or unusual movements, behaviors, phonations or vocalizations. The patient took 1 bathroom break. Mild to moderate snoring was noted. The EKG was in keeping with normal sinus rhythm (NSR).  Post-study,  the patient indicated that sleep was the same as usual.   IMPRESSION: 1. Obstructive Sleep Apnea (OSA) 2. Dysfunctions associated with sleep stages or arousal from sleep  RECOMMENDATIONS: 1. This study demonstrates severe obstructive sleep apnea, with a total AHI of 39.8/hour, REM AHI of 48.8/hour, supine AHI of 58.6/hour and O2 nadir of 80%. Treatment with positive airway pressure in the form of  CPAP is recommended. This will require a full night titration study to optimize therapy. Other treatment options are limited, due to the severity of her sleep apnea, but, generally speaking, may include avoidance of supine sleep position along with weight loss, upper airway or jaw surgery in selected patients or the use of an oral appliance in certain patients. ENT evaluation and/or consultation with a maxillofacial surgeon or dentist may be feasible in some instances.    2. Please note that untreated obstructive sleep apnea may carry additional perioperative morbidity. Patients with significant obstructive sleep apnea should receive perioperative PAP therapy and the surgeons and particularly the anesthesiologist should be informed of the diagnosis and the severity of the sleep disordered breathing. 3. This study shows sleep fragmentation and abnormal sleep stage percentages; these are nonspecific findings and per se do not signify an intrinsic sleep disorder or a cause for the patient's sleep-related symptoms. Causes include (but are not limited to) the first night effect of the sleep study, circadian rhythm disturbances, medication effect or an underlying mood disorder or medical problem.  4. The patient should be cautioned not to drive, work at heights, or operate dangerous or heavy equipment when tired or sleepy. Review and reiteration of good sleep hygiene measures should be pursued with any patient. 5. The patient will be seen in follow-up in the sleep clinic at [facility] for discussion of the test results, symptom and treatment compliance review, further management strategies, etc. The referring provider will be notified of the test results.  I certify that I have reviewed the entire raw data recording prior to the issuance of this report in accordance with the Standards of Accreditation of the American Academy of Sleep Medicine (AASM)  [name], MD, PhD Diplomat, American Board of Neurology and Sleep  Medicine (Neurology and Sleep Medicine)

## 2019-08-13 NOTE — Progress Notes (Signed)
Patient referred by Dr. Anne Hahn, seen by me on 06/16/19, diagnostic PSG on 07/29/19.   Please call and notify the patient that the recent sleep study showed severe obstructive sleep apnea (total AHI of 39.8/hour, REM AHI of 48.8/hour, supine AHI of 58.6/hour and O2 nadir of 80%). I recommend treatment for this in the form of CPAP. This will require a repeat sleep study for proper titration and mask fitting and correct monitoring of the oxygen saturations. Please explain to patient. I have placed an order in the chart. Thanks.  [name], MD, PhD [facility])

## 2019-08-13 NOTE — Addendum Note (Signed)
Addended by: [name] on: 08/13/2019 07:23 AM   Modules accepted: Orders

## 2019-08-14 LAB — BASIC METABOLIC PANEL
BUN/Creatinine Ratio: 13 (ref 12–28)
BUN: 12 mg/dL (ref 8–27)
CO2: 20 mmol/L (ref 20–29)
Calcium: 9.6 mg/dL (ref 8.7–10.3)
Chloride: 102 mmol/L (ref 96–106)
Creatinine, Ser: 0.95 mg/dL (ref 0.57–1.00)
GFR calc Af Amer: 73 mL/min/{1.73_m2} (ref >59)
GFR calc non Af Amer: 63 mL/min/{1.73_m2} (ref >59)
Glucose: 215 mg/dL — ABNORMAL HIGH (ref 65–99)
Potassium: 4.2 mmol/L (ref 3.5–5.2)
Sodium: 139 mmol/L (ref 134–144)

## 2019-08-14 LAB — ALDOSTERONE + RENIN ACTIVITY W/ RATIO
ALDOS/RENIN RATIO: 47.1 — ABNORMAL HIGH (ref 0.0–30.0)
ALDOSTERONE: 15.5 ng/dL (ref 0.0–30.0)
Renin: 0.329 ng/mL/hr (ref 0.167–5.380)

## 2019-08-19 ENCOUNTER — Other Ambulatory Visit: Payer: Self-pay

## 2019-08-19 ENCOUNTER — Other Ambulatory Visit: Payer: Medicare HMO | Admitting: *Deleted

## 2019-08-19 DIAGNOSIS — Z79899 Other long term (current) drug therapy: Secondary | ICD-10-CM

## 2019-08-20 ENCOUNTER — Telehealth: Payer: Self-pay | Admitting: Cardiovascular Disease

## 2019-08-20 LAB — BASIC METABOLIC PANEL
BUN/Creatinine Ratio: 11 — ABNORMAL LOW (ref 12–28)
BUN: 11 mg/dL (ref 8–27)
CO2: 22 mmol/L (ref 20–29)
Calcium: 9.4 mg/dL (ref 8.7–10.3)
Chloride: 102 mmol/L (ref 96–106)
Creatinine, Ser: 1.03 mg/dL — ABNORMAL HIGH (ref 0.57–1.00)
GFR calc Af Amer: 66 mL/min/{1.73_m2} (ref >59)
GFR calc non Af Amer: 57 mL/min/{1.73_m2} — ABNORMAL LOW (ref >59)
Glucose: 210 mg/dL — ABNORMAL HIGH (ref 65–99)
Potassium: 4.2 mmol/L (ref 3.5–5.2)
Sodium: 137 mmol/L (ref 134–144)

## 2019-08-20 NOTE — Telephone Encounter (Signed)
-----   Message from Laurann Montana, [place] sent at 08/20/2019  7:54 AM EDT ----- Please let pt know labs are stable!  Blood sugar is somewhat elevated so make sure she continues to follow with PCP for this. Potassium level is normal. Continue spironolactone WITHOUT potassium supplementation. Find out how BPs have been running the last few days. Please let her also know I got her renin/aldosterone level back that we checked the previous lab value. The actual levels are normal but the ratio is a little skewed - I do not suspect an acute issue but plan to review with Dr. Duke Salvia, our secondary HTN expert, next time we cross paths. Will keep her posted if anything else needs to happen.

## 2019-08-20 NOTE — Telephone Encounter (Signed)
Patient is returning call to discuss results from lab work completed on 08/19/19. Please call.

## 2019-08-20 NOTE — Telephone Encounter (Signed)
Returned call to pt and she has been made aware of her lab results.  

## 2019-08-26 DIAGNOSIS — E119 Type 2 diabetes mellitus without complications: Secondary | ICD-10-CM | POA: Diagnosis not present

## 2019-08-27 ENCOUNTER — Other Ambulatory Visit: Payer: Self-pay

## 2019-08-27 ENCOUNTER — Other Ambulatory Visit: Payer: Self-pay | Admitting: Neurology

## 2019-08-27 MED ORDER — CILOSTAZOL 50 MG PO TABS: 50.0000 mg | ORAL_TABLET | Freq: Two times a day (BID) | ORAL | 3 refills | Status: DC

## 2019-08-27 MED ORDER — ATENOLOL 100 MG PO TABS: 100.0000 mg | ORAL_TABLET | Freq: Every day | ORAL | 3 refills | Status: DC

## 2019-08-28 ENCOUNTER — Other Ambulatory Visit: Payer: Self-pay | Admitting: Neurology

## 2019-08-28 MED ORDER — TOPIRAMATE 50 MG PO TABS: 100.0000 mg | ORAL_TABLET | Freq: Every day | ORAL | 3 refills | Status: DC

## 2019-08-31 DIAGNOSIS — H5203 Hypermetropia, bilateral: Secondary | ICD-10-CM | POA: Diagnosis not present

## 2019-08-31 DIAGNOSIS — H52209 Unspecified astigmatism, unspecified eye: Secondary | ICD-10-CM | POA: Diagnosis not present

## 2019-08-31 DIAGNOSIS — H524 Presbyopia: Secondary | ICD-10-CM | POA: Diagnosis not present

## 2019-09-01 ENCOUNTER — Ambulatory Visit (INDEPENDENT_AMBULATORY_CARE_PROVIDER_SITE_OTHER): Payer: Medicare HMO | Admitting: Neurology

## 2019-09-01 ENCOUNTER — Telehealth: Payer: Self-pay | Admitting: Physician Assistant

## 2019-09-01 ENCOUNTER — Other Ambulatory Visit: Payer: Self-pay

## 2019-09-01 DIAGNOSIS — R351 Nocturia: Secondary | ICD-10-CM

## 2019-09-01 DIAGNOSIS — G4720 Circadian rhythm sleep disorder, unspecified type: Secondary | ICD-10-CM

## 2019-09-01 DIAGNOSIS — G4733 Obstructive sleep apnea (adult) (pediatric): Secondary | ICD-10-CM

## 2019-09-01 DIAGNOSIS — I6529 Occlusion and stenosis of unspecified carotid artery: Secondary | ICD-10-CM

## 2019-09-01 DIAGNOSIS — R519 Headache, unspecified: Secondary | ICD-10-CM

## 2019-09-01 NOTE — Telephone Encounter (Signed)
[name], RMA Called pt and pt's pharmacy to inform them per phone note that pt medication potassium has been D/C by provider. Pharmacy and pt verbalized understanding.       08/20/19 10:52 AM Note ----- Message from Laurann Montana, PA-C sent at 08/20/2019  7:54 AM EDT ----- Please let pt know labs are stable!  Blood sugar is somewhat elevated so make sure she continues to follow with PCP for this. Potassium level is normal. Continue spironolactone WITHOUT potassium supplementation. Find out how BPs have been running the last few days. Please let her also know I got her renin/aldosterone level back that we checked the previous lab value. The actual levels are normal but the ratio is a little skewed - I do not suspect an acute issue but plan to review with Dr. Duke Salvia, our secondary HTN expert, next time we cross paths. Will keep her posted if anything else needs to happen.

## 2019-09-01 NOTE — Telephone Encounter (Signed)
*  STAT* If patient is at the pharmacy, call can be transferred to refill team.   1. Which medications need to be refilled? (please list name of each medication and dose if known) Potassium  2. Which pharmacy/location (including street and city if local pharmacy) is medication to be sent to? [facility]  3. Do they need a 30 day or 90 day supply? 90 days and refills

## 2019-09-01 NOTE — Telephone Encounter (Signed)
*  STAT* If patient is at the pharmacy, call can be transferred to refill team.   1. Which medications need to be refilled? (please list name of each medication and dose if known) Cilostazol  2. Which pharmacy/location (including street and city if local pharmacy) is medication to be sent to? [facility]  3. Do they need a 30 day or 90 day supply? 90

## 2019-09-01 NOTE — Telephone Encounter (Signed)
Refill request

## 2019-09-02 MED ORDER — CILOSTAZOL 50 MG PO TABS: 50.0000 mg | ORAL_TABLET | Freq: Two times a day (BID) | ORAL | 3 refills | Status: DC

## 2019-09-21 ENCOUNTER — Telehealth: Payer: Self-pay

## 2019-09-21 NOTE — Telephone Encounter (Signed)
I called pt. I advised pt that Dr. Frances Furbish reviewed their sleep study results and found that pt did well during the titration study on cpap at a pressure of 14. Dr. Frances Furbish recommends that pt start cpap treatment at home with a pressure of 14. I reviewed PAP compliance expectations with the pt. Pt is agreeable to starting an auto-PAP. I advised pt that an order will be sent to a DME, [facility], and [facility] will call the pt within about one week after they file with the pt's insurance. [facility] will show the pt how to use the machine, fit for masks, and troubleshoot the auto-PAP if needed. A follow up appt was made for insurance purposes with Dr. Frances Furbish on 11/26/2019 at 1130 am. Pt verbalized understanding to arrive 15 minutes early and bring their auto-PAP. A letter with all of this information in it will be mailed to the pt as a reminder. I verified with the pt that the address we have on file is correct. Pt verbalized understanding of results. Pt had no questions at this time but was encouraged to call back if questions arise. I have sent the order to [facility] and have received confirmation that they have received the order.

## 2019-09-21 NOTE — Addendum Note (Signed)
Addended by: [name] on: 09/21/2019 12:18 PM   Modules accepted: Orders

## 2019-09-21 NOTE — Procedures (Signed)
PATIENT'S NAME:  [name], [name] DOB:      November 02, 1955      MR#:    [id number]     DATE OF RECORDING: 09/01/2019 REFERRING M.D.:  Stephanie Acre, MD Study Performed:   CPAP  Titration HISTORY:  64 year old woman with a history of hypertension, hyperlipidemia, diabetes, stroke, carotid artery occlusion, coronary artery disease with history of MI,, asthma, arthritis, anxiety, reflux disease, and morbid obesity with a BMI of over 45, who presents for a full night titration study. Her baseline sleep study from 07/29/19 showed severe obstructive sleep apnea, with a total AHI of 39.8/hour, REM AHI of 48.8/hour, supine AHI of 58.6/hour and O2 nadir of 80%. The patient endorsed the Epworth Sleepiness Scale at 1/24 points. The patient's weight 273 pounds with a height of 65 (inches), resulting in a BMI of 45.5 kg/m2. The patient's neck circumference measured 17 inches.  CURRENT MEDICATIONS: Proventil, Norvasc, Aspirin, Tenoretic, Voltaren, Benadryl, Novolog, Duoneb, Synthroid, Klor-Con, ProAir, Crestor, Ambien   PROCEDURE:  This is a multichannel digital polysomnogram utilizing the SomnoStar 11.2 system.  Electrodes and sensors were applied and monitored per AASM Specifications.   EEG, EOG, Chin and Limb EMG, were sampled at 200 Hz.  ECG, Snore and Nasal Pressure, Thermal Airflow, Respiratory Effort, CPAP Flow and Pressure, Oximetry was sampled at 50 Hz. Digital video and audio were recorded.      The patient was fitted with a small Quattro FFM. CPAP was initiated at 5 cmH20 with heated humidity per AASM standards and pressure was advanced to 14 cmH20 because of hypopneas, apneas and desaturations.  At a PAP pressure of 14 cmH20, there was a reduction of the AHI to 0/hour with supine REM sleep achieved and O2 nadir of 92%.   Lights Out was at 22:14 and Lights On at 05:00. Total recording time (TRT) was 406.5 minutes, with a total sleep time (TST) of 289 minutes. The patient's sleep latency was 22 minutes. REM latency  was 55.5 minutes.  The sleep efficiency was 71.1 %.    SLEEP ARCHITECTURE: WASO (Wake after sleep onset) was 101.5 minutes with moderate sleep fragmentation noted. There were 19 minutes in Stage N1, 153 minutes Stage N2, 82 minutes Stage N3 and 35 minutes in Stage REM.  The percentage of Stage N1 was 6.6%, Stage N2 was 52.9%, Stage N3 was 28.4%, which is increased, and Stage R (REM sleep) was 12.1%, which is reduced. The arousals were noted as: 51 were spontaneous, 0 were associated with PLMs, 32 were associated with respiratory events.  RESPIRATORY ANALYSIS:  There was a total of 67 respiratory events: 9 obstructive apneas, 3 central apneas and 0 mixed apneas with a total of 12 apneas and an apnea index (AI) of 2.5 /hour. There were 55 hypopneas with a hypopnea index of 11.4/hour. The patient also had 0 respiratory event related arousals (RERAs).      The total APNEA/HYPOPNEA INDEX  (AHI) was 13.9 /hour and the total RESPIRATORY DISTURBANCE INDEX was 13.9 /hour  3 events occurred in REM sleep and 64 events in NREM. The REM AHI was 5.1 /hour versus a non-REM AHI of 15.1 /hour.  The patient spent 60.5 minutes of total sleep time in the supine position and 229 minutes in non-supine. The supine AHI was 24.8, versus a non-supine AHI of 11.1.  OXYGEN SATURATION & C02:  The baseline 02 saturation was 92%, with the lowest being 86%. Time spent below 89% saturation equaled 8 minutes.  PERIODIC LIMB MOVEMENTS:  The patient  had a total of 0 Periodic Limb Movements. The Periodic Limb Movement (PLM) index was 0 and the PLM Arousal index was 0 /hour.  Audio and video analysis did not show any abnormal or unusual movements, behaviors, phonations or vocalizations. The patient took no bathroom breaks. The EKG was in keeping with normal sinus rhythm (NSR).  Post-study, the patient indicated that sleep was better than usual.   IMPRESSION: 1. Obstructive Sleep Apnea (OSA) 2. Dysfunctions associated with sleep stages  or arousal from sleep   RECOMMENDATIONS: 1. This study demonstrates resolution of the patient's obstructive sleep apnea with CPAP therapy. I will, therefore, start the patient on home CPAP treatment at a pressure of 14 cm via small FFM (ResMed Quattro), with (heated) humidity. The patient should be reminded to be fully compliant with PAP therapy to improve sleep related symptoms and decrease long term cardiovascular risks. The patient should be reminded, that it may take up to 3 months to get fully used to using PAP with all planned sleep. The earlier full compliance is achieved, the better long term compliance tends to be. Please note that untreated obstructive sleep apnea may carry additional perioperative morbidity. Patients with significant obstructive sleep apnea should receive perioperative PAP therapy and the surgeons and particularly the anesthesiologist should be informed of the diagnosis and the severity of the sleep disordered breathing. 2. This study shows sleep fragmentation and abnormal sleep stage percentages; these are nonspecific findings and per se do not signify an intrinsic sleep disorder or a cause for the patient's sleep-related symptoms. Causes include (but are not limited to) the first night effect of the sleep study, circadian rhythm disturbances, medication effect or an underlying mood disorder or medical problem.  3. The patient should be cautioned not to drive, work at heights, or operate dangerous or heavy equipment when tired or sleepy. Review and reiteration of good sleep hygiene measures should be pursued with any patient. 4. The patient will be seen in follow-up in the sleep clinic at [facility] for discussion of the test results, symptom and treatment compliance review, further management strategies, etc. The referring provider will be notified of the test results.   I certify that I have reviewed the entire raw data recording prior to the issuance of this report in accordance with  the Standards of Accreditation of the American Academy of Sleep Medicine (AASM)   [name], MD, PhD Diplomat, American Board of Neurology and Sleep Medicine (Neurology and Sleep Medicine)

## 2019-09-21 NOTE — Progress Notes (Signed)
Patient referred by Dr. Anne Hahn, seen by me on 06/16/19, diagnostic PSG on 07/29/19. Patient had a CPAP titration study on 09/01/19.  Please call and inform patient that I have entered an order for treatment with positive airway pressure (PAP) treatment for obstructive sleep apnea (OSA). She did well during the latest sleep study with CPAP. We will, therefore, arrange for a machine for home use through a DME (durable medical equipment) company of Her choice; and I will see the patient back in follow-up in about 10 weeks. Please also explain to the patient that I will be looking out for compliance data, which can be downloaded from the machine (stored on an SD card, that is inserted in the machine) or via remote access through a modem, that is built into the machine. At the time of the followup appointment we will discuss sleep study results and how it is going with PAP treatment at home. Please advise patient to bring Her machine at the time of the first FU visit, even though this is cumbersome. Bringing the machine for every visit after that will likely not be needed, but often helps for the first visit to troubleshoot if needed. Please re-enforce the importance of compliance with treatment and the need for [place] to monitor compliance data - often an insurance requirement and actually good feedback for the patient as far as how they are doing.  Also remind patient, that any interim PAP machine or mask issues should be first addressed with the DME company, as they can often help better with technical and mask fit issues. Please ask if patient has a preference regarding DME company.  Please also make sure, the patient has a follow-up appointment with me in about 10 weeks from the setup date, thanks. May see one of our nurse practitioners if needed for proper timing of the FU appointment.  Please fax or rout report to the referring provider. Thanks,   [name], MD, PhD [facility])

## 2019-09-21 NOTE — Telephone Encounter (Signed)
-----   Message from [name], MD sent at 09/21/2019 12:18 PM EDT ----- Patient referred by Dr. Anne Hahn, seen by me on 06/16/19, diagnostic PSG on 07/29/19. Patient had a CPAP titration study on 09/01/19.  Please call and inform patient that I have entered an order for treatment with positive airway pressure (PAP) treatment for obstructive sleep apnea (OSA). She did well during the latest sleep study with CPAP. We will, therefore, arrange for a machine for home use through a DME (durable medical equipment) company of Her choice; and I will see the patient back in follow-up in about 10 weeks. Please also explain to the patient that I will be looking out for compliance data, which can be downloaded from the machine (stored on an SD card, that is inserted in the machine) or via remote access through a modem, that is built into the machine. At the time of the followup appointment we will discuss sleep study results and how it is going with PAP treatment at home. Please advise patient to bring Her machine at the time of the first FU visit, even though this is cumbersome. Bringing the machine for every visit after that will likely not be needed, but often helps for the first visit to troubleshoot if needed. Please re-enforce the importance of compliance with treatment and the need for [place] to monitor compliance data - often an insurance requirement and actually good feedback for the patient as far as how they are doing.  Also remind patient, that any interim PAP machine or mask issues should be first addressed with the DME company, as they can often help better with technical and mask fit issues. Please ask if patient has a preference regarding DME company.  Please also make sure, the patient has a follow-up appointment with me in about 10 weeks from the setup date, thanks. May see one of our nurse practitioners if needed for proper timing of the FU appointment.  Please fax or rout report to the referring provider.  Thanks,   [name], MD, PhD [facility])

## 2019-09-23 DIAGNOSIS — M25562 Pain in left knee: Secondary | ICD-10-CM | POA: Diagnosis not present

## 2019-09-23 DIAGNOSIS — M25561 Pain in right knee: Secondary | ICD-10-CM | POA: Diagnosis not present

## 2019-10-13 ENCOUNTER — Other Ambulatory Visit: Payer: Self-pay

## 2019-10-13 ENCOUNTER — Other Ambulatory Visit: Payer: Medicare HMO | Admitting: *Deleted

## 2019-10-13 DIAGNOSIS — E785 Hyperlipidemia, unspecified: Secondary | ICD-10-CM

## 2019-10-13 DIAGNOSIS — E1140 Type 2 diabetes mellitus with diabetic neuropathy, unspecified: Secondary | ICD-10-CM | POA: Diagnosis not present

## 2019-10-13 LAB — LIPID PANEL
Chol/HDL Ratio: 2.3 ratio (ref 0.0–4.4)
Cholesterol, Total: 149 mg/dL (ref 100–199)
HDL: 66 mg/dL (ref >39)
LDL Chol Calc (NIH): 65 mg/dL (ref 0–99)
Triglycerides: 96 mg/dL (ref 0–149)
VLDL Cholesterol Cal: 18 mg/dL (ref 5–40)

## 2019-10-13 LAB — HEPATIC FUNCTION PANEL
ALT: 26 IU/L (ref 0–32)
AST: 21 IU/L (ref 0–40)
Albumin: 4.5 g/dL (ref 3.8–4.8)
Alkaline Phosphatase: 104 IU/L (ref 48–121)
Bilirubin Total: 0.7 mg/dL (ref 0.0–1.2)
Bilirubin, Direct: 0.22 mg/dL (ref 0.00–0.40)
Total Protein: 7.6 g/dL (ref 6.0–8.5)

## 2019-10-22 DIAGNOSIS — G4733 Obstructive sleep apnea (adult) (pediatric): Secondary | ICD-10-CM | POA: Diagnosis not present

## 2019-10-26 ENCOUNTER — Ambulatory Visit
Admission: RE | Admit: 2019-10-26 | Discharge: 2019-10-26 | Disposition: A | Discharge status: Home / Self Care | Payer: Medicare HMO | Source: Ambulatory Visit | Attending: Emergency Medicine | Admitting: Emergency Medicine

## 2019-10-26 DIAGNOSIS — J439 Emphysema, unspecified: Secondary | ICD-10-CM | POA: Diagnosis not present

## 2019-10-26 DIAGNOSIS — I2510 Atherosclerotic heart disease of native coronary artery without angina pectoris: Secondary | ICD-10-CM | POA: Diagnosis not present

## 2019-10-26 DIAGNOSIS — J984 Other disorders of lung: Secondary | ICD-10-CM | POA: Diagnosis not present

## 2019-10-26 DIAGNOSIS — I700 Atherosclerosis of aorta: Secondary | ICD-10-CM | POA: Diagnosis not present

## 2019-10-26 DIAGNOSIS — R918 Other nonspecific abnormal finding of lung field: Secondary | ICD-10-CM

## 2019-11-01 ENCOUNTER — Other Ambulatory Visit: Payer: Self-pay | Admitting: Physician Assistant

## 2019-11-04 ENCOUNTER — Ambulatory Visit (INDEPENDENT_AMBULATORY_CARE_PROVIDER_SITE_OTHER): Payer: Medicare HMO | Admitting: Emergency Medicine

## 2019-11-04 ENCOUNTER — Other Ambulatory Visit: Payer: Self-pay | Admitting: Physician Assistant

## 2019-11-04 ENCOUNTER — Other Ambulatory Visit: Payer: Self-pay

## 2019-11-04 ENCOUNTER — Encounter: Payer: Self-pay | Admitting: Emergency Medicine

## 2019-11-04 DIAGNOSIS — R0602 Shortness of breath: Secondary | ICD-10-CM

## 2019-11-04 DIAGNOSIS — J849 Interstitial pulmonary disease, unspecified: Secondary | ICD-10-CM | POA: Insufficient documentation

## 2019-11-04 DIAGNOSIS — R918 Other nonspecific abnormal finding of lung field: Secondary | ICD-10-CM

## 2019-11-04 LAB — PULMONARY FUNCTION TEST
DL/VA % pred: 119 %
DL/VA: 4.97 ml/min/mmHg/L
DLCO cor % pred: 81 %
DLCO cor: 16.11 ml/min/mmHg
DLCO unc % pred: 81 %
DLCO unc: 16.11 ml/min/mmHg
FEF 25-75 Post: 1.74 L/sec
FEF 25-75 Pre: 1.47 L/sec
FEF2575-%Change-Post: 18 %
FEF2575-%Pred-Post: 92 %
FEF2575-%Pred-Pre: 77 %
FEV1-%Change-Post: 3 %
FEV1-%Pred-Post: 93 %
FEV1-%Pred-Pre: 89 %
FEV1-Post: 1.84 L
FEV1-Pre: 1.77 L
FEV1FVC-%Change-Post: 5 %
FEV1FVC-%Pred-Pre: 99 %
FEV6-%Change-Post: 0 %
FEV6-%Pred-Post: 92 %
FEV6-%Pred-Pre: 92 %
FEV6-Post: 2.24 L
FEV6-Pre: 2.25 L
FEV6FVC-%Change-Post: 1 %
FEV6FVC-%Pred-Post: 103 %
FEV6FVC-%Pred-Pre: 102 %
FVC-%Change-Post: -1 %
FVC-%Pred-Post: 88 %
FVC-%Pred-Pre: 90 %
FVC-Post: 2.24 L
FVC-Pre: 2.28 L
Post FEV1/FVC ratio: 82 %
Post FEV6/FVC ratio: 100 %
Pre FEV1/FVC ratio: 78 %
Pre FEV6/FVC Ratio: 99 %
RV % pred: 57 %
RV: 1.19 L
TLC % pred: 70 %
TLC: 3.55 L

## 2019-11-04 NOTE — Patient Instructions (Addendum)
Your pulmonary function testing is normal Your CT scan of the chest shows that the areas of old inflammatory change and pulmonary nodules are all stable compared with 04/2019.  This is good news.  We will need to repeat your CT scan of the chest without contrast in September 2022. Discuss your CPAP and your CPAP mask difficulties with Dr. Frances Furbish Follow Dr. Delton Coombes in 1 year to review your CT scan, sooner if your breathing changes in any way.

## 2019-11-04 NOTE — Progress Notes (Signed)
Subjective:    Patient ID: [name], female    DOB: 1955-07-30, 64 y.o.   MRN: [id number]  HPI 64 year old former smoker (15 pack years) with a history of CAD, carotid disease, diabetes, hypertension, hyperlipidemia, hypothyroidism, GERD, morning headaches.  She carries a history of asthma that was made years ago, but she isn't sure whether this was a solid diagnosis.   She is referred today for dyspnea and also a history of pulmonary nodular disease on CT scan of the chest. She does not have exertional SOB, wheeze. She has had PNA and was admitted to [facility] for PNA, COVID negative. She had a CT-PA during that admit, showed basilar GGI and scattered nodules.   CT scan of the chest from 04/24/2019 reviewed by me, shows multiple small pulmonary nodules bilaterally, the largest 1.2 cm in the right upper lobe, some patchy ill-defined areas of groundglass attenuation and septal thickening as well with some associated cylindrical bronchiectatic change and bronchiolectasis most evident in the right upper lobe. Some are stable going back to 2015.   ROV 11/04/19 64 year old woman, former smoker who follows up today for shortness of breath and pulmonary nodular disease by CT scan of the chest.   We repeated her CT scan of the chest 10/26/2019 and I have reviewed.  This shows no significant change in multiple bilateral pulmonary nodules, largest is 1.0 x 0.6 cm in the peripheral right upper lobe.  There is some bandlike bibasilar scar with some associated irregular groundglass opacity, right upper lobe peripheral emphysematous change surrounded by some mild groundglass.  All unchanged.   Pulmonary function testing done today, reviewed by me, shows normal spirometry without a bronchodilator response, restricted lung volumes and a normal diffusion capacity.  There is some slight curve to her flow volume loop that could suggest mild obstruction. She was titrated to CPAP 14 cmH2O by Dr. Frances Furbish. She is having  trouble tolerating her mask - has had itching, no rash Today she reports that she feels well. No SOB, no cough or sputum.    Review of Systems As per HPI     Objective:   Physical Exam Vitals:   11/04/19 1601  BP: 116/70  Pulse: 80  Temp: 98.9 F (37.2 C)  SpO2: 98%   Gen: Pleasant, obese woman, in no distress,  normal affect  ENT: No lesions,  mouth clear,  oropharynx clear, no postnasal drip  Neck: No JVD, no stridor  Lungs: No use of accessory muscles, no crackles or wheezing on normal respiration, no wheeze on forced expiration  Cardiovascular: RRR, heart sounds normal, no murmur or gallops, no peripheral edema  Musculoskeletal: No deformities, no cyanosis or clubbing  Neuro: alert, awake, non focal  Skin: Warm, no lesions or rash      Assessment & Plan:  ILD (interstitial lung disease) (HCC) She has some old interstitial changes that are not in a UIP or NSIP pattern, some at both bases where she has been treated for pneumonia.  Also surrounding some emphysematous change in the right apex.  Significance of this is unclear, not clearly progressive ILD.  We are following her CT to follow her pulmonary nodules.  If the interstitial changes evolve then she may merit bronchoscopy for culture data, possibly even a more extensive ILD work-up with serologies, etc.  SOB (shortness of breath) Normal PFT on 11/04/2019.  She denies any dyspnea currently.  Feels better.  Pulmonary nodules/lesions, multiple Nodules are stable, now for 6 months.  Plan for  a repeat scan in 1 year.  Levy Pupa, MD, PhD 11/04/2019, 4:20 PM  Pulmonary and Critical Care [phone] or if no answer [phone]

## 2019-11-04 NOTE — Addendum Note (Signed)
Addended by: Dorisann Frames R on: 11/04/2019 04:46 PM   Modules accepted: Orders

## 2019-11-04 NOTE — Assessment & Plan Note (Signed)
Nodules are stable, now for 6 months.  Plan for a repeat scan in 1 year.

## 2019-11-04 NOTE — Assessment & Plan Note (Addendum)
Normal PFT on 11/04/2019.  She denies any dyspnea currently.  Feels better.

## 2019-11-04 NOTE — Progress Notes (Signed)
PFT done today. 

## 2019-11-04 NOTE — Assessment & Plan Note (Signed)
She has some old interstitial changes that are not in a UIP or NSIP pattern, some at both bases where she has been treated for pneumonia.  Also surrounding some emphysematous change in the right apex.  Significance of this is unclear, not clearly progressive ILD.  We are following her CT to follow her pulmonary nodules.  If the interstitial changes evolve then she may merit bronchoscopy for culture data, possibly even a more extensive ILD work-up with serologies, etc.

## 2019-11-10 ENCOUNTER — Other Ambulatory Visit: Payer: Self-pay

## 2019-11-10 ENCOUNTER — Ambulatory Visit (INDEPENDENT_AMBULATORY_CARE_PROVIDER_SITE_OTHER): Payer: Medicare HMO | Admitting: Cardiovascular Disease

## 2019-11-10 ENCOUNTER — Encounter: Payer: Self-pay | Admitting: Cardiovascular Disease

## 2019-11-10 VITALS — BP 122/72 | HR 72 | Ht 64.0 in | Wt 266.0 lb

## 2019-11-10 DIAGNOSIS — I2510 Atherosclerotic heart disease of native coronary artery without angina pectoris: Secondary | ICD-10-CM | POA: Diagnosis not present

## 2019-11-10 DIAGNOSIS — I779 Disorder of arteries and arterioles, unspecified: Secondary | ICD-10-CM

## 2019-11-10 DIAGNOSIS — E785 Hyperlipidemia, unspecified: Secondary | ICD-10-CM

## 2019-11-10 DIAGNOSIS — I10 Essential (primary) hypertension: Secondary | ICD-10-CM | POA: Diagnosis not present

## 2019-11-10 DIAGNOSIS — I739 Peripheral vascular disease, unspecified: Secondary | ICD-10-CM

## 2019-11-10 MED ORDER — CILOSTAZOL 100 MG PO TABS: 100.0000 mg | ORAL_TABLET | Freq: Two times a day (BID) | ORAL | 1 refills | Status: DC

## 2019-11-10 NOTE — Patient Instructions (Signed)
Medication Instructions:  INCREASE the Cilostazol (Pletal) to 100 mg twice daily  *If you need a refill on your cardiac medications before your next appointment, please call your pharmacy*   Lab Work: None ordered If you have labs (blood work) drawn today and your tests are completely normal, you will receive your results only by: . MyChart Message (if you have MyChart) OR . A paper copy in the mail If you have any lab test that is abnormal or we need to change your treatment, we will call you to review the results.   Testing/Procedures: None ordered   Follow-Up: At [facility], you and your health needs are our priority.  As part of our continuing mission to provide you with exceptional heart care, we have created designated Provider Care Teams.  These Care Teams include your primary Cardiologist (physician) and Advanced Practice Providers (APPs -  Physician Assistants and Nurse Practitioners) who all work together to provide you with the care you need, when you need it.  We recommend signing up for the patient portal called "MyChart".  Sign up information is provided on this After Visit Summary.  MyChart is used to connect with patients for Virtual Visits (Telemedicine).  Patients are able to view lab/test results, encounter notes, upcoming appointments, etc.  Non-urgent messages can be sent to your provider as well.   To learn more about what you can do with MyChart, go to [email or web address].    Your next appointment:   6 month(s)  The format for your next appointment:   In Person  Provider:   Muhammad Arida, MD   
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
## 2019-11-17 DIAGNOSIS — G4733 Obstructive sleep apnea (adult) (pediatric): Secondary | ICD-10-CM | POA: Diagnosis not present

## 2019-11-21 ENCOUNTER — Other Ambulatory Visit: Payer: Self-pay | Admitting: Neurology

## 2019-11-21 DIAGNOSIS — G4733 Obstructive sleep apnea (adult) (pediatric): Secondary | ICD-10-CM | POA: Diagnosis not present

## 2019-11-26 ENCOUNTER — Ambulatory Visit: Payer: Self-pay | Admitting: Neurology

## 2019-11-27 DIAGNOSIS — Z23 Encounter for immunization: Secondary | ICD-10-CM | POA: Diagnosis not present

## 2019-11-27 DIAGNOSIS — N3000 Acute cystitis without hematuria: Secondary | ICD-10-CM | POA: Diagnosis not present

## 2019-12-02 ENCOUNTER — Other Ambulatory Visit: Payer: Self-pay | Admitting: Neurology

## 2019-12-22 DIAGNOSIS — G4733 Obstructive sleep apnea (adult) (pediatric): Secondary | ICD-10-CM | POA: Diagnosis not present

## 2019-12-30 DIAGNOSIS — N3000 Acute cystitis without hematuria: Secondary | ICD-10-CM | POA: Diagnosis not present

## 2020-01-03 ENCOUNTER — Other Ambulatory Visit: Payer: Self-pay | Admitting: Physician Assistant

## 2020-01-13 ENCOUNTER — Other Ambulatory Visit: Payer: Self-pay | Admitting: Family Medicine

## 2020-01-13 DIAGNOSIS — Z1231 Encounter for screening mammogram for malignant neoplasm of breast: Secondary | ICD-10-CM

## 2020-01-13 DIAGNOSIS — K3184 Gastroparesis: Secondary | ICD-10-CM | POA: Diagnosis not present

## 2020-01-13 DIAGNOSIS — E1165 Type 2 diabetes mellitus with hyperglycemia: Secondary | ICD-10-CM | POA: Diagnosis not present

## 2020-01-14 ENCOUNTER — Ambulatory Visit
Admission: RE | Admit: 2020-01-14 | Discharge: 2020-01-14 | Disposition: A | Discharge status: Home / Self Care | Payer: Medicare HMO | Source: Ambulatory Visit | Attending: Family Medicine | Admitting: Family Medicine

## 2020-01-14 ENCOUNTER — Other Ambulatory Visit: Payer: Self-pay

## 2020-01-14 DIAGNOSIS — Z1231 Encounter for screening mammogram for malignant neoplasm of breast: Secondary | ICD-10-CM

## 2020-01-19 ENCOUNTER — Other Ambulatory Visit: Payer: Self-pay | Admitting: Family Medicine

## 2020-01-19 DIAGNOSIS — R928 Other abnormal and inconclusive findings on diagnostic imaging of breast: Secondary | ICD-10-CM

## 2020-01-19 DIAGNOSIS — G4733 Obstructive sleep apnea (adult) (pediatric): Secondary | ICD-10-CM | POA: Diagnosis not present

## 2020-01-21 DIAGNOSIS — G4733 Obstructive sleep apnea (adult) (pediatric): Secondary | ICD-10-CM | POA: Diagnosis not present

## 2020-01-28 ENCOUNTER — Other Ambulatory Visit: Payer: Self-pay

## 2020-01-28 ENCOUNTER — Ambulatory Visit: Payer: Medicare HMO

## 2020-01-28 ENCOUNTER — Ambulatory Visit
Admission: RE | Admit: 2020-01-28 | Discharge: 2020-01-28 | Disposition: A | Discharge status: Home / Self Care | Payer: Medicare HMO | Source: Ambulatory Visit | Attending: Family Medicine | Admitting: Family Medicine

## 2020-01-28 DIAGNOSIS — R928 Other abnormal and inconclusive findings on diagnostic imaging of breast: Secondary | ICD-10-CM

## 2020-02-15 DIAGNOSIS — Z7984 Long term (current) use of oral hypoglycemic drugs: Secondary | ICD-10-CM | POA: Diagnosis not present

## 2020-02-15 DIAGNOSIS — M79606 Pain in leg, unspecified: Secondary | ICD-10-CM | POA: Diagnosis not present

## 2020-02-15 DIAGNOSIS — G4700 Insomnia, unspecified: Secondary | ICD-10-CM | POA: Diagnosis not present

## 2020-02-15 DIAGNOSIS — E1165 Type 2 diabetes mellitus with hyperglycemia: Secondary | ICD-10-CM | POA: Diagnosis not present

## 2020-02-21 DIAGNOSIS — G4733 Obstructive sleep apnea (adult) (pediatric): Secondary | ICD-10-CM | POA: Diagnosis not present

## 2020-03-05 ENCOUNTER — Other Ambulatory Visit: Payer: Self-pay | Admitting: Physician Assistant
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
## 2020-03-11 ENCOUNTER — Encounter: Payer: Self-pay | Admitting: Physician Assistant

## 2020-03-11 ENCOUNTER — Telehealth: Payer: Self-pay | Admitting: *Deleted

## 2020-03-11 ENCOUNTER — Telehealth (INDEPENDENT_AMBULATORY_CARE_PROVIDER_SITE_OTHER): Payer: Medicare HMO | Admitting: Physician Assistant

## 2020-03-11 ENCOUNTER — Other Ambulatory Visit: Payer: Self-pay

## 2020-03-11 VITALS — BP 122/87 | HR 92 | Ht 64.0 in | Wt 242.0 lb

## 2020-03-11 DIAGNOSIS — E876 Hypokalemia: Secondary | ICD-10-CM | POA: Diagnosis not present

## 2020-03-11 DIAGNOSIS — I10 Essential (primary) hypertension: Secondary | ICD-10-CM

## 2020-03-11 DIAGNOSIS — I2510 Atherosclerotic heart disease of native coronary artery without angina pectoris: Secondary | ICD-10-CM | POA: Diagnosis not present

## 2020-03-11 DIAGNOSIS — E785 Hyperlipidemia, unspecified: Secondary | ICD-10-CM

## 2020-03-11 DIAGNOSIS — I739 Peripheral vascular disease, unspecified: Secondary | ICD-10-CM | POA: Diagnosis not present

## 2020-03-11 DIAGNOSIS — Z87898 Personal history of other specified conditions: Secondary | ICD-10-CM

## 2020-03-11 DIAGNOSIS — I6523 Occlusion and stenosis of bilateral carotid arteries: Secondary | ICD-10-CM | POA: Diagnosis not present

## 2020-03-11 NOTE — Patient Instructions (Signed)
Medication Instructions:  Your physician recommends that you continue on your current medications as directed. Please refer to the Current Medication list given to you today.   *If you need a refill on your cardiac medications before your next appointment, please call your pharmacy*   Lab Work: None ordered  If you have labs (blood work) drawn today and your tests are completely normal, you will receive your results only by: Marland Kitchen MyChart Message (if you have MyChart) OR . A paper copy in the mail If you have any lab test that is abnormal or we need to change your treatment, we will call you to review the results.   Testing/Procedures: None ordered   Follow-Up: At [facility], you and your health needs are our priority.  As part of our continuing mission to provide you with exceptional heart care, we have created designated Provider Care Teams.  These Care Teams include your primary Cardiologist (physician) and Advanced Practice Providers (APPs -  Physician Assistants and Nurse Practitioners) who all work together to provide you with the care you need, when you need it.  We recommend signing up for the patient portal called "MyChart".  Sign up information is provided on this After Visit Summary.  MyChart is used to connect with patients for Virtual Visits (Telemedicine).  Patients are able to view lab/test results, encounter notes, upcoming appointments, etc.  Non-urgent messages can be sent to your provider as well.   To learn more about what you can do with MyChart, go to [email or web address].    Your next appointment:   6 month(s)  The format for your next appointment:   In Person  Provider:   Ronie Spies, PA-C   Other Instructions Keep up the good work! We are so proud of your progress!   Please continue to monitor your blood pressure like you are doing. Call us if you tend to get readings of greater than 130 on the top number or 80 on the bottom number.

## 2020-03-11 NOTE — Telephone Encounter (Signed)
  Patient Consent for Virtual Visit         [name] has provided verbal consent on 03/11/2020 for a virtual visit (video or telephone).   CONSENT FOR VIRTUAL VISIT FOR:  [name]  By participating in this virtual visit I agree to the following:  I hereby voluntarily request, consent and authorize [facility] and its employed or contracted physicians, physician assistants, nurse practitioners or other licensed health care professionals (the Practitioner), to provide me with telemedicine health care services (the "Services") as deemed necessary by the treating Practitioner. I acknowledge and consent to receive the Services by the Practitioner via telemedicine. I understand that the telemedicine visit will involve communicating with the Practitioner through live audiovisual communication technology and the disclosure of certain medical information by electronic transmission. I acknowledge that I have been given the opportunity to request an in-person assessment or other available alternative prior to the telemedicine visit and am voluntarily participating in the telemedicine visit.  I understand that I have the right to withhold or withdraw my consent to the use of telemedicine in the course of my care at any time, without affecting my right to future care or treatment, and that the Practitioner or I may terminate the telemedicine visit at any time. I understand that I have the right to inspect all information obtained and/or recorded in the course of the telemedicine visit and may receive copies of available information for a reasonable fee.  I understand that some of the potential risks of receiving the Services via telemedicine include:  Marland Kitchen Delay or interruption in medical evaluation due to technological equipment failure or disruption; . Information transmitted may not be sufficient (e.g. poor resolution of images) to allow for appropriate medical decision making by the Practitioner;  and/or  . In rare instances, security protocols could fail, causing a breach of personal health information.  Furthermore, I acknowledge that it is my responsibility to provide information about my medical history, conditions and care that is complete and accurate to the best of my ability. I acknowledge that Practitioner's advice, recommendations, and/or decision may be based on factors not within their control, such as incomplete or inaccurate data provided by me or distortions of diagnostic images or specimens that may result from electronic transmissions. I understand that the practice of medicine is not an exact science and that Practitioner makes no warranties or guarantees regarding treatment outcomes. I acknowledge that a copy of this consent can be made available to me via my patient portal [facility] MyChart), or I can request a printed copy by calling the office of [facility].    I understand that my insurance will be billed for this visit.   I have read or had this consent read to me. . I understand the contents of this consent, which adequately explains the benefits and risks of the Services being provided via telemedicine.  . I have been provided ample opportunity to ask questions regarding this consent and the Services and have had my questions answered to my satisfaction. . I give my informed consent for the services to be provided through the use of telemedicine in my medical care

## 2020-03-23 DIAGNOSIS — G4733 Obstructive sleep apnea (adult) (pediatric): Secondary | ICD-10-CM | POA: Diagnosis not present

## 2020-03-28 DIAGNOSIS — M4726 Other spondylosis with radiculopathy, lumbar region: Secondary | ICD-10-CM | POA: Diagnosis not present

## 2020-03-28 DIAGNOSIS — N182 Chronic kidney disease, stage 2 (mild): Secondary | ICD-10-CM | POA: Diagnosis not present

## 2020-03-28 DIAGNOSIS — Z794 Long term (current) use of insulin: Secondary | ICD-10-CM | POA: Diagnosis not present

## 2020-03-28 DIAGNOSIS — N3000 Acute cystitis without hematuria: Secondary | ICD-10-CM | POA: Diagnosis not present

## 2020-03-28 DIAGNOSIS — E1121 Type 2 diabetes mellitus with diabetic nephropathy: Secondary | ICD-10-CM | POA: Diagnosis not present

## 2020-03-28 DIAGNOSIS — M9983 Other biomechanical lesions of lumbar region: Secondary | ICD-10-CM | POA: Diagnosis not present

## 2020-04-11 DIAGNOSIS — Z6841 Body Mass Index (BMI) 40.0 and over, adult: Secondary | ICD-10-CM | POA: Diagnosis not present

## 2020-04-11 DIAGNOSIS — M4316 Spondylolisthesis, lumbar region: Secondary | ICD-10-CM | POA: Diagnosis not present

## 2020-04-19 DIAGNOSIS — N3000 Acute cystitis without hematuria: Secondary | ICD-10-CM | POA: Diagnosis not present

## 2020-04-20 DIAGNOSIS — G4733 Obstructive sleep apnea (adult) (pediatric): Secondary | ICD-10-CM | POA: Diagnosis not present

## 2020-04-27 ENCOUNTER — Other Ambulatory Visit: Payer: Self-pay | Admitting: Family Medicine

## 2020-04-27 DIAGNOSIS — N3001 Acute cystitis with hematuria: Secondary | ICD-10-CM

## 2020-04-27 DIAGNOSIS — N390 Urinary tract infection, site not specified: Secondary | ICD-10-CM

## 2020-05-10 ENCOUNTER — Other Ambulatory Visit: Payer: Self-pay

## 2020-05-10 ENCOUNTER — Ambulatory Visit (INDEPENDENT_AMBULATORY_CARE_PROVIDER_SITE_OTHER): Payer: Medicare HMO | Admitting: Cardiovascular Disease

## 2020-05-10 ENCOUNTER — Encounter: Payer: Self-pay | Admitting: Cardiovascular Disease

## 2020-05-10 VITALS — BP 112/60 | HR 78 | Ht 64.0 in | Wt 238.8 lb

## 2020-05-10 DIAGNOSIS — I739 Peripheral vascular disease, unspecified: Secondary | ICD-10-CM

## 2020-05-10 DIAGNOSIS — I2510 Atherosclerotic heart disease of native coronary artery without angina pectoris: Secondary | ICD-10-CM

## 2020-05-10 DIAGNOSIS — E785 Hyperlipidemia, unspecified: Secondary | ICD-10-CM

## 2020-05-10 DIAGNOSIS — I10 Essential (primary) hypertension: Secondary | ICD-10-CM

## 2020-05-10 DIAGNOSIS — I779 Disorder of arteries and arterioles, unspecified: Secondary | ICD-10-CM
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
## 2020-05-10 NOTE — Patient Instructions (Signed)

## 2020-05-12 ENCOUNTER — Other Ambulatory Visit: Payer: Self-pay

## 2020-05-12 ENCOUNTER — Ambulatory Visit
Admission: RE | Admit: 2020-05-12 | Discharge: 2020-05-12 | Disposition: A | Discharge status: Home / Self Care | Payer: Medicare HMO | Source: Ambulatory Visit | Attending: Family Medicine | Admitting: Family Medicine

## 2020-05-12 DIAGNOSIS — N390 Urinary tract infection, site not specified: Secondary | ICD-10-CM

## 2020-05-12 DIAGNOSIS — R319 Hematuria, unspecified: Secondary | ICD-10-CM | POA: Diagnosis not present

## 2020-05-12 DIAGNOSIS — N3001 Acute cystitis with hematuria: Secondary | ICD-10-CM

## 2020-05-21 DIAGNOSIS — G4733 Obstructive sleep apnea (adult) (pediatric): Secondary | ICD-10-CM | POA: Diagnosis not present

## 2020-05-26 DIAGNOSIS — Z794 Long term (current) use of insulin: Secondary | ICD-10-CM | POA: Diagnosis not present

## 2020-05-26 DIAGNOSIS — E1121 Type 2 diabetes mellitus with diabetic nephropathy: Secondary | ICD-10-CM | POA: Diagnosis not present

## 2020-06-05 ENCOUNTER — Other Ambulatory Visit: Payer: Self-pay | Admitting: Physician Assistant

## 2020-06-05 ENCOUNTER — Other Ambulatory Visit: Payer: Self-pay | Admitting: Cardiovascular Disease

## 2020-06-20 ENCOUNTER — Emergency Department (HOSPITAL_COMMUNITY): Payer: Medicare HMO

## 2020-06-20 ENCOUNTER — Observation Stay (HOSPITAL_COMMUNITY): Payer: Medicare HMO

## 2020-06-20 ENCOUNTER — Encounter (HOSPITAL_COMMUNITY): Payer: Self-pay | Admitting: *Deleted

## 2020-06-20 ENCOUNTER — Observation Stay (HOSPITAL_COMMUNITY)
Admission: EM | Admit: 2020-06-20 | Discharge: 2020-06-23 | Disposition: A | Discharge status: Home / Self Care | Payer: Medicare HMO | Attending: Internal Medicine | Admitting: Internal Medicine

## 2020-06-20 DIAGNOSIS — E039 Hypothyroidism, unspecified: Secondary | ICD-10-CM | POA: Diagnosis present

## 2020-06-20 DIAGNOSIS — I10 Essential (primary) hypertension: Secondary | ICD-10-CM | POA: Insufficient documentation

## 2020-06-20 DIAGNOSIS — R4781 Slurred speech: Secondary | ICD-10-CM | POA: Diagnosis not present

## 2020-06-20 DIAGNOSIS — Z794 Long term (current) use of insulin: Secondary | ICD-10-CM | POA: Diagnosis not present

## 2020-06-20 DIAGNOSIS — Z79899 Other long term (current) drug therapy: Secondary | ICD-10-CM | POA: Insufficient documentation

## 2020-06-20 DIAGNOSIS — Z87891 Personal history of nicotine dependence: Secondary | ICD-10-CM | POA: Diagnosis not present

## 2020-06-20 DIAGNOSIS — K753 Granulomatous hepatitis, not elsewhere classified: Secondary | ICD-10-CM | POA: Diagnosis not present

## 2020-06-20 DIAGNOSIS — J45909 Unspecified asthma, uncomplicated: Secondary | ICD-10-CM | POA: Diagnosis not present

## 2020-06-20 DIAGNOSIS — N3000 Acute cystitis without hematuria: Secondary | ICD-10-CM | POA: Diagnosis not present

## 2020-06-20 DIAGNOSIS — R1011 Right upper quadrant pain: Secondary | ICD-10-CM | POA: Diagnosis present

## 2020-06-20 DIAGNOSIS — K7689 Other specified diseases of liver: Secondary | ICD-10-CM | POA: Diagnosis not present

## 2020-06-20 DIAGNOSIS — G459 Transient cerebral ischemic attack, unspecified: Secondary | ICD-10-CM | POA: Diagnosis not present

## 2020-06-20 DIAGNOSIS — R1084 Generalized abdominal pain: Secondary | ICD-10-CM

## 2020-06-20 DIAGNOSIS — G43909 Migraine, unspecified, not intractable, without status migrainosus: Secondary | ICD-10-CM

## 2020-06-20 DIAGNOSIS — R29818 Other symptoms and signs involving the nervous system: Secondary | ICD-10-CM | POA: Diagnosis not present

## 2020-06-20 DIAGNOSIS — G4733 Obstructive sleep apnea (adult) (pediatric): Secondary | ICD-10-CM | POA: Diagnosis not present

## 2020-06-20 DIAGNOSIS — R42 Dizziness and giddiness: Secondary | ICD-10-CM | POA: Diagnosis not present

## 2020-06-20 DIAGNOSIS — R933 Abnormal findings on diagnostic imaging of other parts of digestive tract: Secondary | ICD-10-CM

## 2020-06-20 DIAGNOSIS — I2510 Atherosclerotic heart disease of native coronary artery without angina pectoris: Secondary | ICD-10-CM | POA: Diagnosis present

## 2020-06-20 DIAGNOSIS — K2900 Acute gastritis without bleeding: Secondary | ICD-10-CM | POA: Diagnosis not present

## 2020-06-20 DIAGNOSIS — Z7982 Long term (current) use of aspirin: Secondary | ICD-10-CM | POA: Insufficient documentation

## 2020-06-20 DIAGNOSIS — J849 Interstitial pulmonary disease, unspecified: Secondary | ICD-10-CM | POA: Diagnosis present

## 2020-06-20 DIAGNOSIS — R109 Unspecified abdominal pain: Secondary | ICD-10-CM | POA: Diagnosis not present

## 2020-06-20 DIAGNOSIS — Z20822 Contact with and (suspected) exposure to covid-19: Secondary | ICD-10-CM | POA: Diagnosis not present

## 2020-06-20 DIAGNOSIS — E119 Type 2 diabetes mellitus without complications: Secondary | ICD-10-CM | POA: Diagnosis not present

## 2020-06-20 DIAGNOSIS — M4319 Spondylolisthesis, multiple sites in spine: Secondary | ICD-10-CM | POA: Diagnosis not present

## 2020-06-20 LAB — COMPREHENSIVE METABOLIC PANEL
ALT: 21 U/L (ref 0–44)
AST: 27 U/L (ref 15–41)
Albumin: 4 g/dL (ref 3.5–5.0)
Alkaline Phosphatase: 121 U/L (ref 38–126)
Anion gap: 9 (ref 5–15)
BUN: 12 mg/dL (ref 8–23)
CO2: 22 mmol/L (ref 22–32)
Calcium: 9.8 mg/dL (ref 8.9–10.3)
Chloride: 104 mmol/L (ref 98–111)
Creatinine, Ser: 0.84 mg/dL (ref 0.44–1.00)
GFR, Estimated: >60 mL/min (ref >60)
Glucose, Bld: 107 mg/dL — ABNORMAL HIGH (ref 70–99)
Potassium: 4 mmol/L (ref 3.5–5.1)
Sodium: 135 mmol/L (ref 135–145)
Total Bilirubin: 0.9 mg/dL (ref 0.3–1.2)
Total Protein: 8.7 g/dL — ABNORMAL HIGH (ref 6.5–8.1)

## 2020-06-20 LAB — CBC
HCT: 46.7 % — ABNORMAL HIGH (ref 36.0–46.0)
Hemoglobin: 15.5 g/dL — ABNORMAL HIGH (ref 12.0–15.0)
MCH: 27.3 pg (ref 26.0–34.0)
MCHC: 33.2 g/dL (ref 30.0–36.0)
MCV: 82.4 fL (ref 80.0–100.0)
Platelets: 242 10*3/uL (ref 150–400)
RBC: 5.67 MIL/uL — ABNORMAL HIGH (ref 3.87–5.11)
RDW: 14.4 % (ref 11.5–15.5)
WBC: 6.2 10*3/uL (ref 4.0–10.5)
nRBC: 0 % (ref 0.0–0.2)

## 2020-06-20 LAB — URINALYSIS, ROUTINE W REFLEX MICROSCOPIC
Bilirubin Urine: NEGATIVE
Glucose, UA: NEGATIVE mg/dL
Hgb urine dipstick: NEGATIVE
Ketones, ur: NEGATIVE mg/dL
Nitrite: NEGATIVE
Protein, ur: 30 mg/dL — AB
Specific Gravity, Urine: 1.018 (ref 1.005–1.030)
WBC, UA: >50 WBC/hpf — ABNORMAL HIGH (ref 0–5)
pH: 6 (ref 5.0–8.0)

## 2020-06-20 LAB — LIPASE, BLOOD: Lipase: 24 U/L (ref 11–51)

## 2020-06-20 LAB — CBG MONITORING, ED: Glucose-Capillary: 141 mg/dL — ABNORMAL HIGH (ref 70–99)

## 2020-06-20 LAB — RESP PANEL BY RT-PCR (FLU A&B, COVID) ARPGX2
Influenza A by PCR: NEGATIVE
Influenza B by PCR: NEGATIVE
SARS Coronavirus 2 by RT PCR: NEGATIVE

## 2020-06-20 MED ORDER — ONDANSETRON HCL 4 MG/2ML IJ SOLN
4.0000 mg | Freq: Once | INTRAMUSCULAR | Status: AC
  Administered 2020-06-20: 4 mg via INTRAVENOUS
  Filled 2020-06-20: qty 2

## 2020-06-20 MED ORDER — ROSUVASTATIN CALCIUM 20 MG PO TABS
40.0000 mg | ORAL_TABLET | Freq: Every day | ORAL | Status: DC
  Administered 2020-06-21 – 2020-06-23 (×3): 40 mg via ORAL
  Filled 2020-06-20 (×3): qty 2

## 2020-06-20 MED ORDER — ACETAMINOPHEN 325 MG PO TABS: 650.0000 mg | ORAL_TABLET | ORAL | Status: DC | PRN

## 2020-06-20 MED ORDER — ACETAMINOPHEN 160 MG/5ML PO SOLN: 650.0000 mg | ORAL | Status: DC | PRN

## 2020-06-20 MED ORDER — ASPIRIN 300 MG RE SUPP: 300.0000 mg | Freq: Every day | RECTAL | Status: DC

## 2020-06-20 MED ORDER — INSULIN ASPART PROT & ASPART (70-30 MIX) 100 UNIT/ML PEN
30.0000 [IU] | PEN_INJECTOR | Freq: Every day | SUBCUTANEOUS | Status: DC
  Administered 2020-06-22: 30 [IU] via SUBCUTANEOUS
  Filled 2020-06-20 (×2): qty 3

## 2020-06-20 MED ORDER — LEVOTHYROXINE SODIUM 75 MCG PO TABS
175.0000 ug | ORAL_TABLET | Freq: Every day | ORAL | Status: DC
  Administered 2020-06-21 – 2020-06-23 (×3): 175 ug via ORAL
  Filled 2020-06-20 (×3): qty 1

## 2020-06-20 MED ORDER — ENOXAPARIN SODIUM 40 MG/0.4ML IJ SOSY
40.0000 mg | PREFILLED_SYRINGE | INTRAMUSCULAR | Status: DC
  Administered 2020-06-20 – 2020-06-22 (×3): 40 mg via SUBCUTANEOUS
  Filled 2020-06-20 (×3): qty 0.4

## 2020-06-20 MED ORDER — SODIUM CHLORIDE 0.9 % IV BOLUS
1000.0000 mL | Freq: Once | INTRAVENOUS | Status: AC
  Administered 2020-06-20: 1000 mL via INTRAVENOUS

## 2020-06-20 MED ORDER — INSULIN ASPART PROT & ASPART (70-30 MIX) 100 UNIT/ML ~~LOC~~ SUSP
50.0000 [IU] | Freq: Every day | SUBCUTANEOUS | Status: DC
  Administered 2020-06-21 – 2020-06-22 (×2): 50 [IU] via SUBCUTANEOUS

## 2020-06-20 MED ORDER — FENTANYL CITRATE (PF) 100 MCG/2ML IJ SOLN
50.0000 ug | Freq: Once | INTRAMUSCULAR | Status: AC
Start: 2020-06-20 — End: 2020-06-20
  Administered 2020-06-20: 50 ug via INTRAVENOUS
  Filled 2020-06-20: qty 2

## 2020-06-20 MED ORDER — ASPIRIN 325 MG PO TABS
325.0000 mg | ORAL_TABLET | Freq: Every day | ORAL | Status: DC
  Administered 2020-06-21 – 2020-06-22 (×2): 325 mg via ORAL
  Filled 2020-06-20 (×3): qty 1

## 2020-06-20 MED ORDER — ACETAMINOPHEN 650 MG RE SUPP: 650.0000 mg | RECTAL | Status: DC | PRN

## 2020-06-20 MED ORDER — INSULIN ASPART 100 UNIT/ML IJ SOLN
0.0000 [IU] | Freq: Three times a day (TID) | INTRAMUSCULAR | Status: DC
  Administered 2020-06-21: 1 [IU] via SUBCUTANEOUS
  Administered 2020-06-21 – 2020-06-23 (×4): 2 [IU] via SUBCUTANEOUS
  Filled 2020-06-20: qty 0.09

## 2020-06-20 MED ORDER — SODIUM CHLORIDE 0.9 % IV SOLN: INTRAVENOUS | Status: AC

## 2020-06-20 MED ORDER — ATENOLOL 25 MG PO TABS
100.0000 mg | ORAL_TABLET | Freq: Every day | ORAL | Status: DC
  Administered 2020-06-21 – 2020-06-23 (×3): 100 mg via ORAL
  Filled 2020-06-20 (×2): qty 4
  Filled 2020-06-20: qty 1

## 2020-06-20 MED ORDER — DICYCLOMINE HCL 10 MG PO CAPS
10.0000 mg | ORAL_CAPSULE | Freq: Three times a day (TID) | ORAL | Status: DC
  Administered 2020-06-21 – 2020-06-23 (×6): 10 mg via ORAL
  Filled 2020-06-20 (×9): qty 1

## 2020-06-20 MED ORDER — STROKE: EARLY STAGES OF RECOVERY BOOK
Freq: Once | Status: AC
  Filled 2020-06-20 (×2): qty 1

## 2020-06-20 MED ORDER — ALBUTEROL SULFATE HFA 108 (90 BASE) MCG/ACT IN AERS
2.0000 | INHALATION_SPRAY | Freq: Four times a day (QID) | RESPIRATORY_TRACT | Status: DC | PRN
  Filled 2020-06-20: qty 6.7

## 2020-06-20 MED ORDER — FENTANYL CITRATE (PF) 100 MCG/2ML IJ SOLN
50.0000 ug | Freq: Once | INTRAMUSCULAR | Status: AC
  Administered 2020-06-20: 50 ug via INTRAVENOUS
  Filled 2020-06-20: qty 2

## 2020-06-20 MED ORDER — CILOSTAZOL 100 MG PO TABS
100.0000 mg | ORAL_TABLET | Freq: Two times a day (BID) | ORAL | Status: DC
  Administered 2020-06-20 – 2020-06-23 (×6): 100 mg via ORAL
  Filled 2020-06-20 (×7): qty 1
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
## 2020-06-20 NOTE — ED Notes (Signed)
Patient transported MRI with MRI tech

## 2020-06-20 NOTE — ED Triage Notes (Addendum)
Pt complains of pain in right abdomen x 2 days. She reports a knot where most of the pain is located. Pt also reports dizziness/off balanced when standing up since waking up this morning. Pt also reported slurred speech at 1130, lasting approximately 5 minutes then resolving. No arm drift, facial droop, slurred speech in triage.

## 2020-06-21 ENCOUNTER — Observation Stay (HOSPITAL_BASED_OUTPATIENT_CLINIC_OR_DEPARTMENT_OTHER): Payer: Medicare HMO

## 2020-06-21 ENCOUNTER — Observation Stay (HOSPITAL_COMMUNITY): Payer: Medicare HMO

## 2020-06-21 DIAGNOSIS — I10 Essential (primary) hypertension: Secondary | ICD-10-CM | POA: Diagnosis not present

## 2020-06-21 DIAGNOSIS — Z7982 Long term (current) use of aspirin: Secondary | ICD-10-CM | POA: Diagnosis not present

## 2020-06-21 DIAGNOSIS — G459 Transient cerebral ischemic attack, unspecified: Secondary | ICD-10-CM

## 2020-06-21 DIAGNOSIS — Z20822 Contact with and (suspected) exposure to covid-19: Secondary | ICD-10-CM | POA: Diagnosis not present

## 2020-06-21 DIAGNOSIS — J45909 Unspecified asthma, uncomplicated: Secondary | ICD-10-CM | POA: Diagnosis not present

## 2020-06-21 DIAGNOSIS — E039 Hypothyroidism, unspecified: Secondary | ICD-10-CM | POA: Diagnosis not present

## 2020-06-21 DIAGNOSIS — E1059 Type 1 diabetes mellitus with other circulatory complications: Secondary | ICD-10-CM | POA: Diagnosis not present

## 2020-06-21 DIAGNOSIS — Z794 Long term (current) use of insulin: Secondary | ICD-10-CM | POA: Diagnosis not present

## 2020-06-21 DIAGNOSIS — Z79899 Other long term (current) drug therapy: Secondary | ICD-10-CM | POA: Diagnosis not present

## 2020-06-21 DIAGNOSIS — K760 Fatty (change of) liver, not elsewhere classified: Secondary | ICD-10-CM | POA: Diagnosis not present

## 2020-06-21 DIAGNOSIS — R109 Unspecified abdominal pain: Secondary | ICD-10-CM | POA: Diagnosis present

## 2020-06-21 DIAGNOSIS — K2900 Acute gastritis without bleeding: Secondary | ICD-10-CM | POA: Diagnosis not present

## 2020-06-21 DIAGNOSIS — I6509 Occlusion and stenosis of unspecified vertebral artery: Secondary | ICD-10-CM | POA: Diagnosis not present

## 2020-06-21 DIAGNOSIS — E119 Type 2 diabetes mellitus without complications: Secondary | ICD-10-CM | POA: Diagnosis not present

## 2020-06-21 DIAGNOSIS — N3000 Acute cystitis without hematuria: Secondary | ICD-10-CM | POA: Diagnosis not present

## 2020-06-21 DIAGNOSIS — Z87891 Personal history of nicotine dependence: Secondary | ICD-10-CM | POA: Diagnosis not present

## 2020-06-21 DIAGNOSIS — I2510 Atherosclerotic heart disease of native coronary artery without angina pectoris: Secondary | ICD-10-CM | POA: Diagnosis not present

## 2020-06-21 LAB — ECHOCARDIOGRAM COMPLETE
Area-P 1/2: 2.48 cm2
Height: 65 in
S' Lateral: 3.5 cm
Weight: 3728 oz

## 2020-06-21 LAB — CBC
HCT: 43.2 % (ref 36.0–46.0)
Hemoglobin: 14.3 g/dL (ref 12.0–15.0)
MCH: 27.4 pg (ref 26.0–34.0)
MCHC: 33.1 g/dL (ref 30.0–36.0)
MCV: 82.8 fL (ref 80.0–100.0)
Platelets: 211 10*3/uL (ref 150–400)
RBC: 5.22 MIL/uL — ABNORMAL HIGH (ref 3.87–5.11)
RDW: 14.3 % (ref 11.5–15.5)
WBC: 5 10*3/uL (ref 4.0–10.5)
nRBC: 0 % (ref 0.0–0.2)

## 2020-06-21 LAB — LIPID PANEL
Cholesterol: 125 mg/dL (ref 0–200)
HDL: 47 mg/dL (ref >40)
LDL Cholesterol: 61 mg/dL (ref 0–99)
Total CHOL/HDL Ratio: 2.7 RATIO
Triglycerides: 83 mg/dL (ref <150)
VLDL: 17 mg/dL (ref 0–40)

## 2020-06-21 LAB — HIV ANTIBODY (ROUTINE TESTING W REFLEX): HIV Screen 4th Generation wRfx: NONREACTIVE

## 2020-06-21 LAB — CREATININE, SERUM
Creatinine, Ser: 0.81 mg/dL (ref 0.44–1.00)
GFR, Estimated: >60 mL/min (ref >60)

## 2020-06-21 LAB — CBG MONITORING, ED
Glucose-Capillary: 164 mg/dL — ABNORMAL HIGH (ref 70–99)
Glucose-Capillary: 164 mg/dL — ABNORMAL HIGH (ref 70–99)

## 2020-06-21 LAB — GLUCOSE, CAPILLARY
Glucose-Capillary: 130 mg/dL — ABNORMAL HIGH (ref 70–99)
Glucose-Capillary: 175 mg/dL — ABNORMAL HIGH (ref 70–99)

## 2020-06-21 LAB — HEMOGLOBIN A1C
Hgb A1c MFr Bld: 7.2 % — ABNORMAL HIGH (ref 4.8–5.6)
Mean Plasma Glucose: 159.94 mg/dL

## 2020-06-21 MED ORDER — INSULIN ASPART PROT & ASPART (70-30 MIX) 100 UNIT/ML ~~LOC~~ SUSP
30.0000 [IU] | Freq: Every day | SUBCUTANEOUS | Status: DC
  Administered 2020-06-21 – 2020-06-23 (×3): 30 [IU] via SUBCUTANEOUS
  Filled 2020-06-21: qty 10

## 2020-06-21 MED ORDER — KETOROLAC TROMETHAMINE 15 MG/ML IJ SOLN: 15.0000 mg | Freq: Four times a day (QID) | INTRAMUSCULAR | Status: DC | PRN

## 2020-06-21 MED ORDER — SODIUM CHLORIDE 0.9 % IV SOLN: 1.0000 g | INTRAVENOUS | Status: DC

## 2020-06-21 MED ORDER — SODIUM CHLORIDE 0.9 % IV SOLN
1.0000 g | INTRAVENOUS | Status: DC
  Administered 2020-06-21 – 2020-06-23 (×3): 1 g via INTRAVENOUS
  Filled 2020-06-21 (×3): qty 10

## 2020-06-21 NOTE — Progress Notes (Signed)
Carotid artery duplex has been completed. Preliminary results can be found in CV Proc through chart review.   06/21/20 8:30 AM Olen Cordial RVT

## 2020-06-21 NOTE — ED Notes (Signed)
[facility] called for transport to [facility].

## 2020-06-21 NOTE — Plan of Care (Signed)
Standby assist  with adls.

## 2020-06-21 NOTE — Progress Notes (Signed)
  Echocardiogram 2D Echocardiogram has been performed.  Kasson Lamere G Destan Franchini 06/21/2020, 2:35 PM

## 2020-06-21 NOTE — Progress Notes (Addendum)
PROGRESS NOTE    [name]  WUJ:[id number] DOB: October 21, 1955 DOA: 06/20/2020 PCP: Mila Palmer, MD   Chief Complain: Slurred speech, right flank/upper quadrant pain  Brief Narrative: Patient is a 65 year old female with history of coronary artery  disease, hypertension, diabetes type 2, peripheral vascular disease, hypothyroidism who presented to the emergency department with complaints of pain on her right upper quadrant and right flank which has been going for 2 days.  No history of nausea, vomiting or diarrhea or fever.  She also developed slurred speech which lasted for 15 minutes and she presented to the emergency department.  Stroke work-up initiated.  MRI of the brain did not show any acute intracranial abnormality.  MRA of the head showed decreased left vertebral artery flow.  She will be  transferred to [facility] for neurology evaluation. Imagings did not show any acute intra-abdominal findings to explain abdominal pain.  She has been started on antibiotic for suspicion of UTI  Assessment & Plan:   Principal Problem:   TIA (transient ischemic attack) Active Problems:   Diabetes (HCC)   CAD (coronary artery disease)   Hypothyroidism   ILD (interstitial lung disease) (HCC)   RUQ pain  TIA: Presented with slurred speech.  Her speech has significantly improved while in the emergency department but she still has some component of dysarthria.  She is alert and oriented.  She does not have any focal neuroogical deficits.  MRI of the brain did not show any acute intracranial abnormality.  MRA showed decreased flow in the left vertebral artery.  Neurology consulted. Stroke work-up initiated.  PT/OT/speech evaluation pending. Echo has been ordered.  Carotid Doppler did not show significant stenosis in bilateral carotid arteries.  She has been started on aspirin 325(takes  at home) which we will continue for now. Hemoglobin A1c of 7.2, LDL of 61.  Abdominal pain: Unclear etiology.  Her  abdomen is nondistended , soft.  She has some tenderness on the right upper quadrant.  Extensive studies done.  CT abdomen/pelvis showed some perihepatic fluid collection but MRI did not show any significant acute findings.  Continue supportive care. During my evaluation in the emergency department, her abdomen pain has already improved.  She denies any nausea or vomiting.  LFTs are normal, lipase normal  UTI: She complains of dysuria/burning sensation while passing urine.  She has history of UTIs in the past.  Urine culture has been sent.  UA suspicious for UTI.  Started on ceftriaxone.  She is afebrile, no leukocytosis.  Diabetes type 2: Takes insulin at home.  Continue current insulin regimen.  Hemoglobin A1c 7.2.  Monitor blood sugars  History of coronary artery disease: Denies any chest pain.  On beta-blockers, statin at home  Hypothyroidism: On Synthyroid  History of peripheral vascular disease: On cilostazol  Hypertension:Currently blood pressure stable.  Takes amlodipine at home.  Antihypertensives on hold  Lung nodule:CT showed Slight interval increase in size of left lower lobe pulmonary nodule, now measuring approximately 8 mm.  Patient aware about this finding.  We recommend PET/CT as an outpatient.  As per patient,she has an appointment  with pulmonology.  She is a past smoker  Morbid obesity: BMI of 38.7           DVT prophylaxis:Lovenox Code Status: Full Family Communication:  Status is: Observation  The patient remains OBS appropriate and will d/c before 2 midnights.  Dispo: The patient is from: Home  Anticipated d/c is to: Home              Patient currently is not medically stable to d/c.   Difficult to place patient No     Consultants: Neurology  Procedures: None  Antimicrobials:  Anti-infectives (From admission, onward)   Start     Dose/Rate Route Frequency Ordered Stop   06/21/20 0930  cefTRIAXone (ROCEPHIN) 1 g in sodium chloride 0.9  % 100 mL IVPB        1 g 200 mL/hr over 30 Minutes Intravenous Every 24 hours 06/21/20 0916     06/21/20 0430  cefTRIAXone (ROCEPHIN) 1 g in sodium chloride 0.9 % 100 mL IVPB  Status:  Discontinued        1 g 200 mL/hr over 30 Minutes Intravenous Every 24 hours 06/21/20 0429 06/21/20 0439      Subjective:  Patient seen and examined at the bedside this morning.  Hemodynamically stable during my evaluation.  She said her abdomen pain is better than last night.  No nausea or vomiting.  Complains of dysuria.   Objective: Vitals:   06/21/20 0826 06/21/20 0930 06/21/20 1039 06/21/20 1230  BP: 118/74 117/70 113/65 108/88  Pulse: 76 74 72 78  Resp: 20 17 18 20   Temp:      TempSrc:      SpO2: 92% 94% 94% 94%  Weight:      Height:        Intake/Output Summary (Last 24 hours) at 06/21/2020 1300 Last data filed at 06/20/2020 1726 Gross per 24 hour  Intake 1000 ml  Output --  Net 1000 ml   Filed Weights   06/20/20 1157  Weight: 105.7 kg    Examination:  General exam: Not in distress, morbidly obese HEENT:PERRL,Oral mucosa moist, Ear/Nose normal on gross exam Respiratory system: Bilateral equal air entry, normal vesicular breath sounds, no wheezes or crackles  Cardiovascular system: S1 & S2 heard, RRR. No JVD, murmurs, rubs, gallops or clicks. No pedal edema. Gastrointestinal system: Abdomen is nondistended, soft and mild tenderness in the right upper quadrant. No organomegaly or masses felt. Normal bowel sounds heard. Central nervous system: Alert and oriented. No focal neurological deficits.  Mild dysarthria Extremities: No edema, no clubbing ,no cyanosis Skin: No rashes, lesions or ulcers,no icterus ,no pallor   Data Reviewed: I have personally reviewed following labs and imaging studies  CBC: Recent Labs  Lab 06/20/20 1210 06/21/20 0507  WBC 6.2 5.0  HGB 15.5* 14.3  HCT 46.7* 43.2  MCV 82.4 82.8  PLT 242 211   Basic Metabolic Panel: Recent Labs  Lab  06/20/20 1210 06/21/20 0507  NA 135  --   K 4.0  --   CL 104  --   CO2 22  --   GLUCOSE 107*  --   BUN 12  --   CREATININE 0.84 0.81  CALCIUM 9.8  --    GFR: Estimated Creatinine Clearance: 83.6 mL/min (by C-G formula based on SCr of 0.81 mg/dL). Liver Function Tests: Recent Labs  Lab 06/20/20 1210  AST 27  ALT 21  ALKPHOS 121  BILITOT 0.9  PROT 8.7*  ALBUMIN 4.0   Recent Labs  Lab 06/20/20 1210  LIPASE 24   No results for input(s): AMMONIA in the last 168 hours. Coagulation Profile: No results for input(s): INR, PROTIME in the last 168 hours. Cardiac Enzymes: No results for input(s): CKTOTAL, CKMB, CKMBINDEX, TROPONINI in the last 168 hours. BNP (last 3 results) No results  for input(s): PROBNP in the last 8760 hours. HbA1C: Recent Labs    06/21/20 0507  HGBA1C 7.2*   CBG: Recent Labs  Lab 06/20/20 2147 06/21/20 0755 06/21/20 1218  GLUCAP 141* 164* 164*   Lipid Profile: Recent Labs    06/21/20 0507  CHOL 125  HDL 47  LDLCALC 61  TRIG 83  CHOLHDL 2.7   Thyroid Function Tests: No results for input(s): TSH, T4TOTAL, FREET4, T3FREE, THYROIDAB in the last 72 hours. Anemia Panel: No results for input(s): VITAMINB12, FOLATE, FERRITIN, TIBC, IRON, RETICCTPCT in the last 72 hours. Sepsis Labs: No results for input(s): PROCALCITON, LATICACIDVEN in the last 168 hours.  Recent Results (from the past 240 hour(s))  Resp Panel by RT-PCR (Flu A&B, Covid) Nasopharyngeal Swab     Status: None   Collection Time: 06/20/20  7:16 PM   Specimen: Nasopharyngeal Swab; Nasopharyngeal(NP) swabs in vial transport medium  Result Value Ref Range Status   SARS Coronavirus 2 by RT PCR NEGATIVE NEGATIVE Final    Comment: (NOTE) SARS-CoV-2 target nucleic acids are NOT DETECTED.  The SARS-CoV-2 RNA is generally detectable in upper respiratory specimens during the acute phase of infection. The lowest concentration of SARS-CoV-2 viral copies this assay can detect is 138  copies/mL. A negative result does not preclude SARS-Cov-2 infection and should not be used as the sole basis for treatment or other patient management decisions. A negative result may occur with  improper specimen collection/handling, submission of specimen other than nasopharyngeal swab, presence of viral mutation(s) within the areas targeted by this assay, and inadequate number of viral copies(<138 copies/mL). A negative result must be combined with clinical observations, patient history, and epidemiological information. The expected result is Negative.  Fact Sheet for Patients:  BloggerCourse.com  Fact Sheet for Healthcare Providers:  SeriousBroker.it  This test is no t yet approved or cleared by the [place] FDA and  has been authorized for detection and/or diagnosis of SARS-CoV-2 by FDA under an Emergency Use Authorization (EUA). This EUA will remain  in effect (meaning this test can be used) for the duration of the COVID-19 declaration under Section 564(b)(1) of the Act, 21 U.S.C.section 360bbb-3(b)(1), unless the authorization is terminated  or revoked sooner.       Influenza A by PCR NEGATIVE NEGATIVE Final   Influenza B by PCR NEGATIVE NEGATIVE Final    Comment: (NOTE) The Xpert Xpress SARS-CoV-2/FLU/RSV plus assay is intended as an aid in the diagnosis of influenza from Nasopharyngeal swab specimens and should not be used as a sole basis for treatment. Nasal washings and aspirates are unacceptable for Xpert Xpress SARS-CoV-2/FLU/RSV testing.  Fact Sheet for Patients: BloggerCourse.com  Fact Sheet for Healthcare Providers: SeriousBroker.it  This test is not yet approved or cleared by the [place] FDA and has been authorized for detection and/or diagnosis of SARS-CoV-2 by FDA under an Emergency Use Authorization (EUA). This EUA will remain in effect (meaning  this test can be used) for the duration of the COVID-19 declaration under Section 564(b)(1) of the Act, 21 U.S.C. section 360bbb-3(b)(1), unless the authorization is terminated or revoked.  Performed at [facility], [address] [place], [place] [address]          Radiology Studies: CT ABDOMEN PELVIS WO CONTRAST  Result Date: 06/20/2020 CLINICAL DATA:  Right-sided abdominal pain for 2 days EXAM: CT ABDOMEN AND PELVIS WITHOUT CONTRAST TECHNIQUE: Multidetector CT imaging of the abdomen and pelvis was performed following the standard protocol without IV contrast.  COMPARISON:  05/12/2020, 05/27/2013 FINDINGS: Lower chest: 8 mm left lower lobe pulmonary nodule, slightly increased in size from the previous study (series 5, image 4). Coarsened bibasilar interstitial markings. Heart size within normal limits. Coronary artery calcifications are present. Prominent right cardiophrenic lymph nodes are unchanged dating back to 2015. Hepatobiliary: Scattered punctate calcified granulomas within the liver. No focal liver lesion evident on unenhanced CT. No hyperdense gallstone. No biliary dilatation. Pancreas: Unremarkable. No pancreatic ductal dilatation or surrounding inflammatory changes. Spleen: Normal in size without focal abnormality. Adrenals/Urinary Tract: Adrenal glands are unremarkable. Kidneys are normal, without renal calculi, focal lesion, or hydronephrosis. Bladder is unremarkable. Stomach/Bowel: Stomach is within normal limits. Appendix is surgically absent. Minimal scattered colonic diverticulosis no evidence of bowel wall thickening, distention, or inflammatory changes. Vascular/Lymphatic: Prominent atherosclerotic calcifications throughout the aortoiliac axis. No aneurysm. No abdominopelvic lymphadenopathy. Reproductive: Status post hysterectomy. No adnexal masses. Other: Postsurgical changes from prior ventral hernia repair. Trace fluid inferior to the tip of the right  hepatic lobe. No organized abdominopelvic fluid collection. No pneumoperitoneum. Musculoskeletal: Chronic bilateral L5 pars interarticularis defects with grade 1 anterolisthesis of L5 on S1. No new or acute osseous findings. IMPRESSION: 1. No acute abdominopelvic findings. 2. Trace fluid inferior to the tip of the right hepatic lobe, nonspecific. 3. Slight interval increase in size of left lower lobe pulmonary nodule, now measuring approximately 8 mm. Consider follow-up PET-CT for further characterization. 4. Aortic atherosclerosis (ICD10-I70.0). Electronically Signed   By: Duanne Guess D.O.   On: 06/20/2020 16:35   CT Head Wo Contrast  Result Date: 06/20/2020 CLINICAL DATA:  Dizziness and balance issues while standing up since waking this morning. Slurred speech. EXAM: CT HEAD WITHOUT CONTRAST TECHNIQUE: Contiguous axial images were obtained from the base of the skull through the vertex without intravenous contrast. COMPARISON:  Brain MRI 05/07/2019 FINDINGS: Brain: No evidence of acute infarction, hemorrhage, hydrocephalus, extra-axial collection or mass lesion/mass effect. There is mild diffuse low-attenuation within the subcortical and periventricular white matter compatible with chronic microvascular disease. Similar to previous brain MRI. Vascular: No hyperdense vessel or unexpected calcification. Skull: Normal. Negative for fracture or focal lesion. Sinuses/Orbits: The paranasal sinuses and mastoid air cells are clear. Other: None IMPRESSION: 1. No acute intracranial abnormalities. 2. Chronic small vessel ischemic change. Electronically Signed   By: Signa Kell M.D.   On: 06/20/2020 19:00   MR ANGIO HEAD WO CONTRAST  Addendum Date: 06/21/2020   ADDENDUM REPORT: 06/21/2020 12:55 ADDENDUM: These results were called by telephone at the time of interpretation on 06/21/2020 at 12:55 pm to Dr. Burnadette Pop, who verbally acknowledged these results. Electronically Signed   By: Baldemar Lenis M.D.   On: 06/21/2020 12:55   Result Date: 06/21/2020 CLINICAL DATA:  Neuro deficit, acute, stroke suspected. EXAM: MRA HEAD WITHOUT CONTRAST TECHNIQUE: Angiographic images of the Circle of Willis were acquired using MRA technique without intravenous contrast. COMPARISON:  Brain MRI Jun 20, 2020. FINDINGS: Anterior circulation: Luminal irregularity at the cavernous segment of the bilateral internal carotid arteries suggesting intracranial atherosclerotic disease with mild stenosis at the bilateral distal cavernous segment. There is also mild stenosis at the origin of the left A1 and left M1 segments (series 101, image 6) and moderate stenosis at the origin of a left M3/MCA superior division branch (series 101, image 3). The MCA and ACA vascular trees are otherwise maintained. Posterior circulation: Diminished flow related enhancement of the proximal V4 segment of the left vertebral artery. This may represent slow  flow versus occlusion. However, recent brain MRI showed normal flow void in the segment, raising suspicion for artifact. Mild stenosis of the bilateral vertebral arteries at the vertebrobasilar junction. Mild luminal irregularity of the basilar artery and proximal posterior cerebral arteries without hemodynamically significant stenosis. Other: None. IMPRESSION: 1. Diminished flow related enhancement at the proximal intracranial left vertebral artery may represent slow flow/occlusion versus artifact. 2. Mild stenosis of the bilateral intracranial vertebral arteries at the vertebrobasilar junction. 3. Atherosclerotic changes in the bilateral anterior circulation with mild stenosis at the bilateral cavernous ICAs, mild stenosis at the origin of the left A1 and M1 segments and moderate stenosis at the origin of a left M3/MCA superior division branch. Electronically Signed: By: Baldemar Lenis M.D. On: 06/21/2020 12:40   MR BRAIN WO CONTRAST  Result Date: 06/20/2020 CLINICAL DATA:   Acute neurologic deficit. Dizziness. Slurred speech. EXAM: MRI HEAD WITHOUT CONTRAST TECHNIQUE: Multiplanar, multiecho pulse sequences of the brain and surrounding structures were obtained without intravenous contrast. COMPARISON:  05/07/2019 FINDINGS: Brain: No acute infarct, mass effect or extra-axial collection. No acute or chronic hemorrhage. There is multifocal hyperintense T2-weighted signal within the white matter. Parenchymal volume and CSF spaces are normal. the midline structures are normal. Vascular: Major flow voids are preserved. Skull and upper cervical spine: Normal calvarium and skull base. Visualized upper cervical spine and soft tissues are normal. Sinuses/Orbits:No paranasal sinus fluid levels or advanced mucosal thickening. No mastoid or middle ear effusion. Normal orbits. IMPRESSION: 1. No acute intracranial abnormality. 2. Findings of mild chronic small vessel ischemic disease. Electronically Signed   By: Deatra Robinson M.D.   On: 06/20/2020 21:47   MR ABDOMEN MRCP WO CONTRAST  Result Date: 06/21/2020 CLINICAL DATA:  Abdominal pain, biliary obstruction suspected EXAM: MRI ABDOMEN WITHOUT CONTRAST  (INCLUDING MRCP) TECHNIQUE: Multiplanar multisequence MR imaging of the abdomen was performed. Heavily T2-weighted images of the biliary and pancreatic ducts were obtained, and three-dimensional MRCP images were rendered by post processing. COMPARISON:  CT abdomen pelvis, 06/20/2020 FINDINGS: Lower chest: Heterogeneous airspace disease in the included bilateral lung bases, poorly assessed by MRI. Hepatobiliary: Mild hepatic steatosis. No mass or other parenchymal abnormality identified. No gallstones or gallbladder wall thickening. No biliary ductal dilatation. Normal contour and caliber of the common bile duct to the ampulla. No filling defect or other obstruction. Pancreas: No mass, inflammatory changes, or other parenchymal abnormality identified. No pancreatic ductal dilatation. Spleen:  Within  normal limits in size and appearance. Adrenals/Urinary Tract: No masses identified. No evidence of hydronephrosis. Stomach/Bowel: Visualized portions within the abdomen are unremarkable. Vascular/Lymphatic: No pathologically enlarged lymph nodes identified. No abdominal aortic aneurysm demonstrated. Other:  None. Musculoskeletal: No suspicious bone lesions identified. IMPRESSION: 1. Normal contour and caliber of the common bile duct to the ampulla. No filling defect or other obstruction. No gallstones or gallbladder wall thickening. 2. Mild hepatic steatosis. 3. Heterogeneous airspace disease in the included bilateral lung bases, poorly assessed by MRI. Electronically Signed   By: Lauralyn Primes M.D.   On: 06/21/2020 11:57   MR 3D Recon At Scanner  Result Date: 06/21/2020 CLINICAL DATA:  Abdominal pain, biliary obstruction suspected EXAM: MRI ABDOMEN WITHOUT CONTRAST  (INCLUDING MRCP) TECHNIQUE: Multiplanar multisequence MR imaging of the abdomen was performed. Heavily T2-weighted images of the biliary and pancreatic ducts were obtained, and three-dimensional MRCP images were rendered by post processing. COMPARISON:  CT abdomen pelvis, 06/20/2020 FINDINGS: Lower chest: Heterogeneous airspace disease in the included bilateral lung bases, poorly assessed by  MRI. Hepatobiliary: Mild hepatic steatosis. No mass or other parenchymal abnormality identified. No gallstones or gallbladder wall thickening. No biliary ductal dilatation. Normal contour and caliber of the common bile duct to the ampulla. No filling defect or other obstruction. Pancreas: No mass, inflammatory changes, or other parenchymal abnormality identified. No pancreatic ductal dilatation. Spleen:  Within normal limits in size and appearance. Adrenals/Urinary Tract: No masses identified. No evidence of hydronephrosis. Stomach/Bowel: Visualized portions within the abdomen are unremarkable. Vascular/Lymphatic: No pathologically enlarged lymph nodes  identified. No abdominal aortic aneurysm demonstrated. Other:  None. Musculoskeletal: No suspicious bone lesions identified. IMPRESSION: 1. Normal contour and caliber of the common bile duct to the ampulla. No filling defect or other obstruction. No gallstones or gallbladder wall thickening. 2. Mild hepatic steatosis. 3. Heterogeneous airspace disease in the included bilateral lung bases, poorly assessed by MRI. Electronically Signed   By: Lauralyn Primes M.D.   On: 06/21/2020 11:57   VAS US CAROTID (at [facility] [address] [address] and [facility] only)  Result Date: 06/21/2020 Carotid Arterial Duplex Study Patient Name:  [name]  Date of Exam:   06/21/2020 Medical Rec #: [id number]       Accession #:    [id number] Date of Birth: January 05, 1956        Patient Gender: F Patient Age:   64Y Exam Location:  [facility] Procedure:      VAS US CAROTID Referring Phys: [id number] Meryle Ready [facility] --------------------------------------------------------------------------------  Indications:       TIA. Risk Factors:      Hypertension, hyperlipidemia, Diabetes, coronary artery                    disease, PAD. Comparison Study:  06/11/2019 - Bi 1-39% ICA stenosis Performing Technologist: Chanda Busing RVT  Examination Guidelines: A complete evaluation includes B-mode imaging, spectral Doppler, color Doppler, and power Doppler as needed of all accessible portions of each vessel. Bilateral testing is considered an integral part of a complete examination. Limited examinations for reoccurring indications may be performed as noted.  Right Carotid Findings: +----------+--------+--------+--------+--------------------------+--------+           PSV cm/sEDV cm/sStenosisPlaque Description        Comments +----------+--------+--------+--------+--------------------------+--------+ CCA Prox  101     17              smooth and heterogenous   tortuous +----------+--------+--------+--------+--------------------------+--------+ CCA Distal62      13               smooth and heterogenous            +----------+--------+--------+--------+--------------------------+--------+ ICA Prox  62      14              irregular and heterogenous         +----------+--------+--------+--------+--------------------------+--------+ ICA Distal76      29                                        tortuous +----------+--------+--------+--------+--------------------------+--------+ ECA       83      12                                                 +----------+--------+--------+--------+--------------------------+--------+ +----------+--------+-------+--------+-------------------+  PSV cm/sEDV cmsDescribeArm Pressure (mmHG) +----------+--------+-------+--------+-------------------+ Subclavian116                                        +----------+--------+-------+--------+-------------------+ +---------+--------+--+--------+--+---------+ VertebralPSV cm/s80EDV cm/s21Antegrade +---------+--------+--+--------+--+---------+  Left Carotid Findings: +----------+--------+--------+--------+--------------------------+--------+           PSV cm/sEDV cm/sStenosisPlaque Description        Comments +----------+--------+--------+--------+--------------------------+--------+ CCA Prox  79      15              smooth and heterogenous            +----------+--------+--------+--------+--------------------------+--------+ CCA Mid   96      17              irregular and heterogenous         +----------+--------+--------+--------+--------------------------+--------+ CCA Distal64      11              smooth and heterogenous            +----------+--------+--------+--------+--------------------------+--------+ ICA Prox  49      17              smooth and heterogenous            +----------+--------+--------+--------+--------------------------+--------+ ICA Distal56      18                                        tortuous  +----------+--------+--------+--------+--------------------------+--------+ ECA       88      11                                                 +----------+--------+--------+--------+--------------------------+--------+ +----------+--------+--------+--------+-------------------+           PSV cm/sEDV cm/sDescribeArm Pressure (mmHG) +----------+--------+--------+--------+-------------------+ Subclavian115                                         +----------+--------+--------+--------+-------------------+ +---------+--------+--+--------+-+---------+ VertebralPSV cm/s20EDV cm/s7Antegrade +---------+--------+--+--------+-+---------+   Summary: Right Carotid: Velocities in the right ICA are consistent with a 1-39% stenosis. Left Carotid: Velocities in the left ICA are consistent with a 1-39% stenosis. Vertebrals: Bilateral vertebral arteries demonstrate antegrade flow. *See table(s) above for measurements and observations.     Preliminary         Scheduled Meds: .  stroke: mapping our early stages of recovery book   Does not apply Once  . aspirin  300 mg Rectal Daily   Or  . aspirin  325 mg Oral Daily  . atenolol  100 mg Oral Daily  . cilostazol  100 mg Oral BID  . dicyclomine  10 mg Oral TID AC  . enoxaparin (LOVENOX) injection  40 mg Subcutaneous Q24H  . insulin aspart  0-9 Units Subcutaneous TID WC  . insulin aspart protamine - aspart  30 Units Subcutaneous QPC supper  . insulin aspart protamine- aspart  50 Units Subcutaneous Q breakfast  . levothyroxine  175 mcg Oral Q0600  . rosuvastatin  40 mg Oral Daily   Continuous Infusions: . sodium chloride 50 mL/hr at 06/20/20 2210  . cefTRIAXone (ROCEPHIN)  IV 1 g (06/21/20 1203)     LOS: 0 days    Time spent: 35 mins.More than 50% of that time was spent in counseling and/or coordination of care.      Burnadette Pop, MD [facility] P5/17/2022, 1:00 PM

## 2020-06-21 NOTE — ED Notes (Signed)
MRI will come get pt this morning, must be NPO 4hrs for MRCP scan
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
## 2020-06-21 NOTE — ED Notes (Signed)
Patient transported to MRI 

## 2020-06-21 NOTE — ED Notes (Signed)
Report called and given to Illene Regulus, RN at [facility]

## 2020-06-21 NOTE — ED Notes (Signed)
Pt returned from MRI at this time

## 2020-06-21 NOTE — Progress Notes (Signed)
PT Cancellation Note  Patient Details Name: DAVANEE KLINKNER MRN: [id number] DOB: 01/24/1956   Cancelled Treatment:    Reason Eval/Treat Not Completed: Patient not medically ready, awaiting further tests for TIA and abdominal pain. Will check back another after completed.   Rada Hay 06/21/2020, 7:00 AM Blanchard Kelch PT Acute Rehabilitation Services Pager [phone] Office [phone]

## 2020-06-21 NOTE — ED Notes (Signed)
Provided pt with sandwich. 

## 2020-06-22 DIAGNOSIS — E1059 Type 1 diabetes mellitus with other circulatory complications: Secondary | ICD-10-CM | POA: Diagnosis not present

## 2020-06-22 DIAGNOSIS — E7800 Pure hypercholesterolemia, unspecified: Secondary | ICD-10-CM

## 2020-06-22 DIAGNOSIS — K2900 Acute gastritis without bleeding: Secondary | ICD-10-CM | POA: Diagnosis not present

## 2020-06-22 DIAGNOSIS — R1011 Right upper quadrant pain: Secondary | ICD-10-CM | POA: Diagnosis not present

## 2020-06-22 DIAGNOSIS — G43909 Migraine, unspecified, not intractable, without status migrainosus: Secondary | ICD-10-CM | POA: Diagnosis not present

## 2020-06-22 DIAGNOSIS — I2510 Atherosclerotic heart disease of native coronary artery without angina pectoris: Secondary | ICD-10-CM

## 2020-06-22 DIAGNOSIS — E039 Hypothyroidism, unspecified: Secondary | ICD-10-CM | POA: Diagnosis not present

## 2020-06-22 DIAGNOSIS — Z8673 Personal history of transient ischemic attack (TIA), and cerebral infarction without residual deficits: Secondary | ICD-10-CM

## 2020-06-22 LAB — RAPID URINE DRUG SCREEN, HOSP PERFORMED
Amphetamines: NOT DETECTED
Barbiturates: NOT DETECTED
Benzodiazepines: NOT DETECTED
Cocaine: NOT DETECTED
Opiates: NOT DETECTED
Tetrahydrocannabinol: NOT DETECTED

## 2020-06-22 LAB — URINE CULTURE

## 2020-06-22 LAB — GLUCOSE, CAPILLARY
Glucose-Capillary: 120 mg/dL — ABNORMAL HIGH (ref 70–99)
Glucose-Capillary: 107 mg/dL — ABNORMAL HIGH (ref 70–99)
Glucose-Capillary: 155 mg/dL — ABNORMAL HIGH (ref 70–99)
Glucose-Capillary: 100 mg/dL — ABNORMAL HIGH (ref 70–99)
Glucose-Capillary: 81 mg/dL (ref 70–99)

## 2020-06-22 MED ORDER — ASPIRIN EC 81 MG PO TBEC
81.0000 mg | DELAYED_RELEASE_TABLET | Freq: Every day | ORAL | Status: DC
  Administered 2020-06-23: 81 mg via ORAL
  Filled 2020-06-22: qty 1

## 2020-06-22 NOTE — Care Management Obs Status (Signed)
MEDICARE OBSERVATION STATUS NOTIFICATION   Patient Details  Name: [name] MRN: [id number] Date of Birth: 1955/02/27   Medicare Observation Status Notification Given:  Yes    Kermit Balo, RN 06/22/2020, 2:00 PM

## 2020-06-22 NOTE — Progress Notes (Addendum)
STROKE TEAM PROGRESS NOTE   INTERVAL HISTORY  65yoF with a history of CAD, carotid artery occlusion, DM, hypertension, hyperlipidemia, peripheral artery disease, and remote CVA in 2014 who presents to the ED with right upper quadrant/flank pain, dizziness when getting out of bed, and dysarthria lasting approximately 15 minutes.  She has had speech difficulties intermittently for the past 6 months but most recently been increasing in frequency.  She notices her speech slowing and slurred occurring daily.  She is also having issues with short term memory.      Vitals:   06/21/20 1950 06/21/20 2332 06/22/20 0325 06/22/20 0801  BP: 109/66 123/74 127/65 135/66  Pulse: 61 79 78 78  Resp: 17 18 17 18   Temp: 98.1 F (36.7 C) 97.7 F (36.5 C) 98.4 F (36.9 C) 97.9 F (36.6 C)  TempSrc: Oral Oral Oral Oral  SpO2: 98% 95% 96% 96%  Weight:      Height:       CBC:  Recent Labs  Lab 06/20/20 1210 06/21/20 0507  WBC 6.2 5.0  HGB 15.5* 14.3  HCT 46.7* 43.2  MCV 82.4 82.8  PLT 242 211   Basic Metabolic Panel:  Recent Labs  Lab 06/20/20 1210 06/21/20 0507  NA 135  --   K 4.0  --   CL 104  --   CO2 22  --   GLUCOSE 107*  --   BUN 12  --   CREATININE 0.84 0.81  CALCIUM 9.8  --    Lipid Panel:  Recent Labs  Lab 06/21/20 0507  CHOL 125  TRIG 83  HDL 47  CHOLHDL 2.7  VLDL 17  LDLCALC 61   HgbA1c:  Recent Labs  Lab 06/21/20 0507  HGBA1C 7.2*    MR ANGIO HEAD WO CONTRAST Result Date: 06/21/2020 IMPRESSION:  1. Diminished flow related enhancement at the proximal intracranial left vertebral artery may represent slow flow/occlusion versus artifact.  2. Mild stenosis of the bilateral intracranial vertebral arteries at the vertebrobasilar junction.  3. Atherosclerotic changes in the bilateral anterior circulation with mild stenosis at the bilateral cavernous ICAs, mild stenosis at the origin of the left A1 and M1 segments and moderate stenosis at the origin of a left M3/MCA  superior division branch.   MR ABDOMEN MRCP WO CONTRAST Result Date: 06/21/2020 IMPRESSION:  1. Normal contour and caliber of the common bile duct to the ampulla. No filling defect or other obstruction. No gallstones or gallbladder wall thickening.  2. Mild hepatic steatosis.  3. Heterogeneous airspace disease in the included bilateral lung bases, poorly assessed by MRI.   MR 3D Recon At Scanner Result Date: 06/21/2020 IMPRESSION:  1. Normal contour and caliber of the common bile duct to the ampulla. No filling defect or other obstruction. No gallstones or gallbladder wall thickening.  2. Mild hepatic steatosis.  3. Heterogeneous airspace disease in the included bilateral lung bases, poorly assessed by MRI.    ECHOCARDIOGRAM Result Date: 06/21/2020    IMPRESSIONS   1. Left ventricular ejection fraction, by estimation, is 55 to 60%. The left ventricle has normal function. The left ventricle has no regional wall motion abnormalities. There is mild concentric left ventricular hypertrophy. Left ventricular diastolic parameters are consistent with Grade I diastolic dysfunction (impaired relaxation).   2. Right ventricular systolic function is normal. The right ventricular size is normal.   3. The mitral valve is grossly normal. No evidence of mitral valve regurgitation. No evidence of mitral stenosis.   4. The  aortic valve is tricuspid. There is mild thickening of the aortic valve. Aortic valve regurgitation is not visualized. No aortic stenosis is present.   5. The inferior vena cava is normal in size with greater than 50% respiratory variability, suggesting right atrial pressure of 3 mmHg.   Carotid Arterial Duplex Study Result Date: 06/21/2020  Summary: Right Carotid: Velocities in the right ICA are consistent with a 1-39% stenosis. Left Carotid: Velocities in the left ICA are consistent with a 1-39% stenosis. Vertebrals: Bilateral vertebral arteries demonstrate antegrade flow. *See table(s)  above for measurements and observations.     PHYSICAL EXAM GENERAL: Awake, alert, in no acute distress Psych: Affect appropriate for situation, calm and cooperative with examination Head: Normocephalic and atraumatic, dry mm EENT: Arcus senilis present bilaterally, normal conjunctivae, wears eyeglasses at baseline, no OP obstruction LUNGS: Normal respiratory effort. Non-labored breathing CV: Extremities warm, without edema ABDOMEN: Soft, non-distended Ext: warm, without obvious abnormality  NEURO:  Mental Status: Awake, alert, and oriented to person, age, month, place, and situation. She is able to provide a clear and coherent history of present illness. Mild dysarthria She is not aphasic. Naming, repetition, fluency, and comprehension are intact.  No neglect is noted.  Cranial Nerves:  II: PERRL 3 mm/brisk. Visual fields full.  III, IV, VI: EOMI without ptosis V: Sensation is intact to light touch and symmetrical to face VII: Face is symmetric resting and smiling VIII: Hearing is intact to voice IX, X: Palate elevation is symmetric. Phonation normal.  XI: Normal sternocleidomastoid and trapezius muscle strength XII: Tongue protrudes midline without fasciculations.   Motor: 5/5 strength is all muscle groups without drift Tone and bulk are normal.  Sensation: Intact to light touch bilaterally in all four extremities.   Coordination: FTN intact bilaterally left side slower. HKS intact bilaterally.  Gait: deferred  ASSESSMENT/PLAN [name] is a 65 y.o. female with history of CAD, carotid artery occlusion, DM, hypertension, hyperlipidemia, peripheral artery disease, and remote CVA in 2014 who presented to the ED 5/16 for evaluation of approximately 3 days of right upper quadrant / flank pain and dizziness when getting out of bed.  Anxiety vs complicated migraine, less likely TIA   CT head:  No acute abnormality.   MRI  No acute intracranial abnormality.  MRA: mild  stenosis of the bilateral intracranial vertebral arteries at the vertebrobasilar junction. Atherosclerotic changes in the bilateral anterior circulation with mild stenosis at the bilateral cavernous ICAs, mild stenosis at the origin of the left A1 and M1 segments and moderate stenosis at the origin of a left M3/MCA superior division branch.   Carotid Doppler: 1-39% stenosis  2D Echo EF: 55 to 60%, mild concentric LVH  LDL 61  HgbA1c 7.2  VTE prophylaxis - Lovenox  Diet: heart healthy  aspirin 81 mg daily and Pletal 100 mg bid prior to admission, will continue on discharge.  Therapy recommendations:  OT home health   Disposition:  Pending  No stroke identified on imaging.  Will sign off for now.  Please call with any further questions.    Hypertension  Home meds:  Amlodipine 10mg  daily, atenolol 100mg  daily, spironolactone 25mg  daily  Stable . Permissive hypertension (OK if < 220/120) but gradually normalize in 5-7 days . Long-term BP goal normotensive  Hyperlipidemia  Home meds:  Crestor 40mg  daily, resumed in hospital  LDL 61, goal < 70  Continue statin at discharge  Diabetes type II Controlled  Home meds:  trulicity 4.5mg  weekly, Aspart  HgbA1c 7.2, goal < 7.0  CBGs  SSI  UTI  UA: large LEs  Urine culture pending  Ceftriaxone 1g q24 hours  Other Stroke Risk Factors  Advanced Age >/= 25   Cigarette smoker remote smoke, quit 19 years ago.  She has a 7.25 pack-year smoking history.  ETOH use, alcohol level: 2 standard drinks per week.  advised to drink no more than 1 drink a day.  Obesity, Body mass index is 38.77 kg/m., BMI >/= 30 associated with increased stroke risk, recommend weight loss, diet and exercise as appropriate   Hx stroke: Stroke in October 2014. Symptoms tingling in left arm, slurred speech  Family hx stroke (unsure)  Coronary artery disease: Cath 2015 moderate LAD, diagonal, and OM disease with 100% dRCA and L-R collaterals, patch  angioplasty (01/2013)prolonged QT interval  Common migraines with intractable migreaine  Other Active Problems  Anxiety  Chronic renal failure  Arthritis  Asthma: Proair HFA inhaler, albuterol  Hypothyroidism: synthroid daily  Pulmonary nodules/lesions, multiple  Irritable bowel syndrome: Bentyl 10mg  TID  Hospital day # 0  [name], ACNP-BC Stroke NP  ATTENDING NOTE: I reviewed above note and agree with the assessment and plan. Pt was seen and examined.   65 year old female with history of CAD, left CEA in 2014, diabetes, hypertension, hyperlipidemia, PAD, stroke in 11/2012 admitted for right upper quadrant/flank pain, intermittent dizziness and slurred speech for several months.  She stated that when she stands up quickly she may have dizziness and unsteadiness briefly, this has been going on for several months from time to time.  She also has slurred speech lasting 50 minutes, average once a month for several months.  Last time was on Monday.  CT no acute abnormality, MRI negative, carotid Doppler negative.  MRI showed left V4 occlusion, left M3 moderate stenosis.  EF 55 to 60%.  LDL 61, A1c 7.2.  UDS negative.  Creatinine 0.84.  UA WBC more than 50.  Patient had a stroke in 11/2012 with right semiovale infarct, found to have left ICA 80 to 99% stenosis on carotid Doppler.  LDL 143, A1c 7.0, EF 60 to 65%.  She had left CEA in 01/2013.  On exam, patient neurologically intact, unknown focal deficit.  Patient dizziness symptoms on standing up likely orthostatic hypotension, recommend check orthostatic vitals which I have ordered.  Her slurred speech symptoms likely due to anxiety or complicated migraine, less likely for TIA.  Recommend continue home medication aspirin 81 in the plateau 100 twice daily.  Continue Crestor 40.  Currently on Rocephin for UTI treatment.  PT/OT recommend home health.  For detailed assessment and plan, please refer to above as I have  made changes wherever appropriate.   Neurology will sign off. Please call with questions.  No neuro follow-up needed at this time. Thanks for the consult.   02/2013, MD PhD Stroke Neurology 06/22/2020 5:02 PM     To contact Stroke Continuity provider, please refer to 06/24/2020. After hours, contact General Neurology

## 2020-06-22 NOTE — TOC Initial Note (Signed)
Transition of Care [facility]) - Initial/Assessment Note    Patient Details  Name: LAKEYN DOKKEN MRN: [id number] Date of Birth: 1956/01/01  Transition of Care [facility] CM/SW Contact:    Kermit Balo, RN Phone Number: 06/22/2020, 2:12 PM  Clinical Narrative:                 Pt lives at home with her adult son. She denies any issues with home medications or transportation.  Son able to provide transport home at time of d/c.  Pt without a preference for [facility] services. CM has arranged HH through [facility]. Information on the AVS. Pt refused 3 in 1 recommended by OT. TOC following.  Expected Discharge Plan: Home w Home Health Services Barriers to Discharge: Continued Medical Work up   Patient Goals and CMS Choice   CMS Medicare.gov Compare Post Acute Care list provided to:: Patient Choice offered to / list presented to : Patient  Expected Discharge Plan and Services Expected Discharge Plan: Home w Home Health Services   Discharge Planning Services: CM Consult Post Acute Care Choice: Home Health Living arrangements for the past 2 months: Single Family Home                           HH Arranged: PT,OT HH Agency: [facility] Date [facility] Agency Contacted: 06/22/20   Representative spoke with at [facility] Agency: Kandee Keen  Prior Living Arrangements/Services Living arrangements for the past 2 months: Single Family Home Lives with:: Adult Children Patient language and need for interpreter reviewed:: Yes Do you feel safe going back to the place where you live?: Yes        Care giver support system in place?: Yes (comment) Current home services: DME (cane/ shower seat/ elevated commode with bars) Criminal Activity/Legal Involvement Pertinent to Current Situation/Hospitalization: No - Comment as needed  Activities of Daily Living Home Assistive Devices/Equipment: Eyeglasses (reading glasses) ADL Screening (condition at time of admission) Patient's cognitive ability adequate to safely  complete daily activities?: Yes Is the patient deaf or have difficulty hearing?: No Does the patient have difficulty seeing, even when wearing glasses/contacts?: No Does the patient have difficulty concentrating, remembering, or making decisions?: No Patient able to express need for assistance with ADLs?: Yes Does the patient have difficulty dressing or bathing?: No Independently performs ADLs?: Yes (appropriate for developmental age) Does the patient have difficulty walking or climbing stairs?: No Weakness of Legs: Both Weakness of Arms/Hands: Both  Permission Sought/Granted                  Emotional Assessment Appearance:: Appears stated age Attitude/Demeanor/Rapport: Engaged Affect (typically observed): Accepting Orientation: : Oriented to Self,Oriented to Place,Oriented to  Time,Oriented to Situation   Psych Involvement: No (comment)  Admission diagnosis:  TIA (transient ischemic attack) [G45.9] Generalized abdominal pain [R10.84] Abnormal magnetic resonance cholangiopancreatography (MRCP) [R93.3] Patient Active Problem List   Diagnosis Date Noted  . RUQ pain 06/20/2020  . Generalized abdominal pain   . ILD (interstitial lung disease) (HCC) 11/04/2019  . Pulmonary nodules/lesions, multiple 06/05/2019  . Common migraine with intractable migraine 06/04/2019  . Spondylolisthesis, lumbar region 01/03/2017  . Pain in right leg 01/03/2017  . Hyperglycemia 04/09/2014  . Diabetic hyperosmolar non-ketotic state (HCC) 04/09/2014  . Hypothyroidism 04/09/2014  . Hypertension 04/09/2014  . PVD (peripheral vascular disease) (HCC) 11/02/2013  . Carotid stenosis 01/08/2013  . Left carotid bruit 12/15/2012  . Hypertensive urgency 12/02/2012  . TIA (  transient ischemic attack) 12/02/2012  . Headache 12/02/2012  . Hypokalemia 02/10/2012  . Obesity (BMI 30-39.9) 02/09/2012  . AKI (acute kidney injury) (HCC) 02/09/2012  . Dehydration 02/09/2012  . SBO (small bowel obstruction)  (HCC) 02/08/2012  . Pleuritic chest pain 12/14/2011  . SOB (shortness of breath) 12/14/2011  . Hypotension 12/14/2011  . ARF (acute renal failure) (HCC) 12/14/2011  . Diabetes (HCC) 12/14/2011  . Hyperlipidemia 12/14/2011  . CAD (coronary artery disease) 12/14/2011  . CAP (community acquired pneumonia) 12/14/2011   PCP:  Mila Palmer, MD Pharmacy:   [facility] [phone],  - [address] [place] [address] Phone: [phone] Fax: [phone]     Social Determinants of Health (SDOH) Interventions    Readmission Risk Interventions No flowsheet data found.
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
## 2020-06-22 NOTE — Plan of Care (Signed)
  Problem: Education: Goal: Knowledge of secondary prevention will improve Outcome: Progressing Goal: Knowledge of patient specific risk factors addressed and post discharge goals established will improve Outcome: Progressing Goal: Individualized Educational Video(s) Outcome: Progressing   Problem: Education: Goal: Knowledge of General Education information will improve Description: Including pain rating scale, medication(s)/side effects and non-pharmacologic comfort measures Outcome: Progressing   Problem: Clinical Measurements: Goal: Ability to maintain clinical measurements within normal limits will improve Outcome: Progressing Goal: Will remain free from infection Outcome: Progressing Goal: Diagnostic test results will improve Outcome: Progressing Goal: Respiratory complications will improve Outcome: Progressing Goal: Cardiovascular complication will be avoided Outcome: Progressing   Problem: Pain Managment: Goal: General experience of comfort will improve Outcome: Progressing   Problem: Safety: Goal: Ability to remain free from injury will improve Outcome: Progressing

## 2020-06-22 NOTE — Evaluation (Signed)
Occupational Therapy Evaluation Patient Details Name: [name] MRN: [id number] DOB: [birth year] Today's Date: 06/22/2020    History of Present Illness Pt is a 65 y/o female admitted 06/20/20 presenting with 3 day hx of R upper quadrant/flank pain, transient dysarthria. CT and MRI brain with no acute abnormalities. MRA with diminished enhancement of the proximal intracranial left vertebral artery. Per neurology cerebral hypoperfusion without infarct involving the posterior circulation due to vertebral artery stenosis.  PMH includes: CAD, arthritis, DM, HTN, PAD, SBO, CVA.   Clinical Impression   PTA patient independent. Admitted for above and presenting with problem list below, including impaired balance, decreased activity tolerance, mild L sided weakness and decreased coordination, and impaired cognition.  Short blessed test reveals questionable impairments in short term memory, sequencing and attention (scored 6/28), with deficits noted in multitasking/dual cognitive tasks, safety and awareness as well. Further functional cognition testing using pill box recommended.  Pt completing ADLs with up to min guard assist for balance dynamically, mobility in room with up to min guard with pt reaching out for UE support frequently.  Believe she will benefit from further OT services while admitted and after dc at [facility] level to optimize independence and safety with ADLs, mobility and IADLs.  Will follow.     Follow Up Recommendations  Home health OT;Supervision - Intermittent    Equipment Recommendations  3 in 1 bedside commode    Recommendations for Other Services PT consult;Speech consult     Precautions / Restrictions Precautions Precautions: Fall Restrictions Weight Bearing Restrictions: No      Mobility Bed Mobility Overal bed mobility: Modified Independent             General bed mobility comments: no assist required    Transfers Overall transfer level: Needs  assistance Equipment used: None Transfers: Sit to/from Stand Sit to Stand: Min guard         General transfer comment: for safety/balance    Balance Overall balance assessment: Mild deficits observed, not formally tested (preference to UE support dynamically)                                         ADL either performed or assessed with clinical judgement   ADL Overall ADL's : Needs assistance/impaired     Grooming: Supervision/safety;Standing   Upper Body Bathing: Set up;Sitting   Lower Body Bathing: Min guard;Sit to/from stand   Upper Body Dressing : Set up;Sitting   Lower Body Dressing: Min guard;Sit to/from stand   Toilet Transfer: Min guard;Ambulation Toilet Transfer Details (indicate cue type and reason): simulated in room         Functional mobility during ADLs: Min guard General ADL Comments: pt limited by mild unsteadiness, decreased activity tolerance, mild L weakness and decreased coordination     Vision   Vision Assessment?: No apparent visual deficits     Perception     Praxis      Pertinent Vitals/Pain Pain Assessment: No/denies pain     Hand Dominance Right   Extremity/Trunk Assessment Upper Extremity Assessment Upper Extremity Assessment: LUE deficits/detail LUE Deficits / Details: mild L sided weakness and decreased coordination LUE Sensation: WNL LUE Coordination: decreased fine motor;decreased gross motor   Lower Extremity Assessment Lower Extremity Assessment: Defer to PT evaluation       Communication Communication Communication: No difficulties   Cognition Arousal/Alertness: Awake/alert Behavior During Therapy: [facility] for  tasks assessed/performed Overall Cognitive Status: No family/caregiver present to determine baseline cognitive functioning Area of Impairment: Memory;Safety/judgement;Awareness;Problem solving                     Memory: Decreased short-term memory   Safety/Judgement: Decreased  awareness of deficits;Decreased awareness of safety Awareness: Emergent Problem Solving: Difficulty sequencing;Requires verbal cues General Comments: Short blessed test reveals 6/28 questionable impairement with deficits in STM and sequencing. pt with decreased awareness of safety, reports unsteady but asking to get up on her own. Difficulty with dual cog task, stopping when walking back to bed and trying to complete parts of SBT.   General Comments       Exercises     Shoulder Instructions      Home Living Family/patient expects to be discharged to:: Private residence Living Arrangements: Alone Available Help at Discharge: Family;Available PRN/intermittently Type of Home: Apartment Home Access: Level entry     Home Layout: One level     Bathroom Shower/Tub: Chief Strategy Officer: Handicapped height     Home Equipment: Grab bars - tub/shower;Grab bars - toilet;Cane - single point   Additional Comments: handicapped apartment      Prior Functioning/Environment Level of Independence: Independent        Comments: IADLs, driving        OT Problem List: Decreased strength;Decreased activity tolerance;Impaired balance (sitting and/or standing);Decreased cognition;Decreased coordination;Decreased safety awareness;Decreased knowledge of precautions      OT Treatment/Interventions: Self-care/ADL training;DME and/or AE instruction;Therapeutic activities;Cognitive remediation/compensation;Patient/family education;Balance training;Neuromuscular education    OT Goals(Current goals can be found in the care plan section) Acute Rehab OT Goals Patient Stated Goal: to get home OT Goal Formulation: With patient Time For Goal Achievement: 07/06/20 Potential to Achieve Goals: Good  OT Frequency: Min 2X/week   Barriers to D/C:            Co-evaluation              AM-PAC OT "6 Clicks" Daily Activity     Outcome Measure Help from another person eating meals?:  None Help from another person taking care of personal grooming?: A Little Help from another person toileting, which includes using toliet, bedpan, or urinal?: A Little Help from another person bathing (including washing, rinsing, drying)?: A Little Help from another person to put on and taking off regular upper body clothing?: A Little Help from another person to put on and taking off regular lower body clothing?: A Little 6 Click Score: 19   End of Session Equipment Utilized During Treatment: Gait belt Nurse Communication: Mobility status  Activity Tolerance: Patient tolerated treatment well Patient left: in bed;with call bell/phone within reach;with bed alarm set  OT Visit Diagnosis: Other abnormalities of gait and mobility (R26.89);Muscle weakness (generalized) (M62.81);Other symptoms and signs involving cognitive function                Time: 4665-9935 OT Time Calculation (min): 18 min Charges:  OT General Charges $OT Visit: 1 Visit OT Evaluation $OT Eval Moderate Complexity: 1 Mod  Barry Brunner, OT Acute Rehabilitation Services Pager [phone] Office [phone]   Chancy Milroy 06/22/2020, 10:33 AM

## 2020-06-23 DIAGNOSIS — K2900 Acute gastritis without bleeding: Secondary | ICD-10-CM | POA: Diagnosis not present

## 2020-06-23 DIAGNOSIS — G43909 Migraine, unspecified, not intractable, without status migrainosus: Secondary | ICD-10-CM | POA: Diagnosis not present

## 2020-06-23 DIAGNOSIS — I2510 Atherosclerotic heart disease of native coronary artery without angina pectoris: Secondary | ICD-10-CM | POA: Diagnosis not present

## 2020-06-23 DIAGNOSIS — E1059 Type 1 diabetes mellitus with other circulatory complications: Secondary | ICD-10-CM | POA: Diagnosis not present

## 2020-06-23 DIAGNOSIS — E039 Hypothyroidism, unspecified: Secondary | ICD-10-CM | POA: Diagnosis not present

## 2020-06-23 DIAGNOSIS — E7800 Pure hypercholesterolemia, unspecified: Secondary | ICD-10-CM | POA: Diagnosis not present

## 2020-06-23 DIAGNOSIS — R1011 Right upper quadrant pain: Secondary | ICD-10-CM | POA: Diagnosis not present

## 2020-06-23 LAB — GLUCOSE, CAPILLARY
Glucose-Capillary: 115 mg/dL — ABNORMAL HIGH (ref 70–99)
Glucose-Capillary: 152 mg/dL — ABNORMAL HIGH (ref 70–99)
Glucose-Capillary: 151 mg/dL — ABNORMAL HIGH (ref 70–99)

## 2020-06-23 LAB — URINE CULTURE: Culture: NO GROWTH

## 2020-06-23 MED ORDER — ROSUVASTATIN CALCIUM 40 MG PO TABS: 40.0000 mg | ORAL_TABLET | Freq: Every day | ORAL | 3 refills | Status: AC

## 2020-06-23 MED ORDER — PANTOPRAZOLE SODIUM 40 MG PO TBEC: 40.0000 mg | DELAYED_RELEASE_TABLET | Freq: Every day | ORAL | 1 refills | Status: DC

## 2020-06-23 MED ORDER — ADULT MULTIVITAMIN W/MINERALS CH
1.0000 | ORAL_TABLET | Freq: Every day | ORAL | Status: DC
  Administered 2020-06-23: 1 via ORAL
  Filled 2020-06-23: qty 1

## 2020-06-23 MED ORDER — PROSOURCE PLUS PO LIQD
30.0000 mL | Freq: Two times a day (BID) | ORAL | Status: DC
  Administered 2020-06-23: 30 mL via ORAL
  Filled 2020-06-23: qty 30

## 2020-06-23 MED ORDER — GLUCERNA SHAKE PO LIQD
237.0000 mL | Freq: Three times a day (TID) | ORAL | Status: DC
  Administered 2020-06-23: 237 mL via ORAL

## 2020-06-23 NOTE — Discharge Summary (Signed)
Physician Discharge Summary  [name] QPR:[id number] DOB: 1955/04/21 DOA: 06/20/2020  PCP: Jonathon Jordan, MD  Admit date: 06/20/2020 Discharge date: 06/23/2020  Admitted From: Home  Discharge disposition: Home Health  Recommendations for Outpatient Follow-Up:   . Follow up with your primary care provider in one week.  . Check CBC, BMP, magnesium in the next visit  Discharge Diagnosis:   Principal Problem: Complex migraine Active Problems:   Diabetes ([place])   CAD (coronary artery disease)   Hypothyroidism   ILD (interstitial lung disease) ([place])   RUQ pain Acute gastritis Acute cystitis  Discharge Condition: Improved.  Diet recommendation: Low sodium, heart healthy.    Wound care: None.  Code status: Full.   History of Present Illness:   Amanda A Jamisonis a 65 y.o.femalewithhistory of CAD, hypertension, peripheral vascular disease, diabetes mellitus, hypothyroidism presented to hospital with right upper quadrant pain and right flank pain for 2 days without any nausea vomiting diarrhea fever or chills.  She also had dizziness.  In the ED patient had UA that showed leukocyte esterase and squamous cells.  CT scan of the abdomen showed some perihepatic fluid collection but CT head was unremarkable.  There was some concern for TIA.  Patient was then placed in observation.    Hospital Course:   Following conditions were addressed during hospitalization as listed below,  Possible migraine or anxiety as per neurology.    TIA has been ruled out.  MRI of the brain MRA carotid duplex 2D echocardiogram was performed without any evidence of stroke.    Patient noted with total cholesterol of 236 and LDL of 143.  She was 7.2 better than 3 years ago at 7.7.  Patient was continued on aspirin, patient, Crestor from outpatient.  Right upper quadrant/right flank pain . Possibly secondary to gastritis or UTI.  MRCP was negative.  She stated the significant GERD and gastritis.   She was initiated on Protonix.    This will be continued on discharge.  Burning urination with abnormal urine.  Likely acute cystitis.  This has improved as well.  Completed 3-day course of antibiotic.  Urine culture did not show any growth.  History of CAD patient did not have any chest pain.  She was continued on aspirin, beta-blockers and statins.  Peripheral vascular disease on cilostazol from home will be continued on discharge.  Diabetes mellitus type 2 on NovoLog 99/35 a and Trulicity this will be continued on discharge.  Hypothyroidism continue Synthroid  Disposition.  At this time, patient is stable for disposition home with outpatient PCP follow-up.  Medical Consultants:    Neurology  Procedures:    None Subjective:   Today, patient feels much better and her abdomen, no tenderness or pain.  Denies any headache dizziness.  Wishes to go home.  Some burning urination which has improved.  Discharge Exam:   Vitals:   06/23/20 0345 06/23/20 0823  BP: 132/68 137/72  Pulse: 77 75  Resp: 18 18  Temp: 98.3 F (36.8 C) 98.2 F (36.8 C)  SpO2: 90% 95%   Vitals:   06/22/20 1945 06/22/20 2340 06/23/20 0345 06/23/20 0823  BP: 118/65 129/70 132/68 137/72  Pulse: 77 81 77 75  Resp: 17 17 18 18   Temp: 98.2 F (36.8 C) 98.1 F (36.7 C) 98.3 F (36.8 C) 98.2 F (36.8 C)  TempSrc: Oral Oral Oral Oral  SpO2: 96% 95% 90% 95%  Weight:      Height:  General: Alert awake, not in obvious distress HENT: pupils equally reacting to light,  No scleral pallor or icterus noted. Oral mucosa is moist.  Chest:  Clear breath sounds.  Diminished breath sounds bilaterally. No crackles or wheezes.  CVS: S1 &S2 heard. No murmur.  Regular rate and rhythm. Abdomen: Soft, nontender, nondistended.  Bowel sounds are heard.   Extremities: No cyanosis, clubbing or edema.  Peripheral pulses are palpable. Psych: Alert, awake and oriented, normal mood CNS:  No cranial nerve deficits.   Power equal in all extremities.   Skin: Warm and dry.  No rashes noted.  The results of significant diagnostics from this hospitalization (including imaging, microbiology, ancillary and laboratory) are listed below for reference.     Diagnostic Studies:   CT ABDOMEN PELVIS WO CONTRAST  Result Date: 06/20/2020 CLINICAL DATA:  Right-sided abdominal pain for 2 days EXAM: CT ABDOMEN AND PELVIS WITHOUT CONTRAST TECHNIQUE: Multidetector CT imaging of the abdomen and pelvis was performed following the standard protocol without IV contrast. COMPARISON:  05/12/2020, 05/27/2013 FINDINGS: Lower chest: 8 mm left lower lobe pulmonary nodule, slightly increased in size from the previous study (series 5, image 4). Coarsened bibasilar interstitial markings. Heart size within normal limits. Coronary artery calcifications are present. Prominent right cardiophrenic lymph nodes are unchanged dating back to 2015. Hepatobiliary: Scattered punctate calcified granulomas within the liver. No focal liver lesion evident on unenhanced CT. No hyperdense gallstone. No biliary dilatation. Pancreas: Unremarkable. No pancreatic ductal dilatation or surrounding inflammatory changes. Spleen: Normal in size without focal abnormality. Adrenals/Urinary Tract: Adrenal glands are unremarkable. Kidneys are normal, without renal calculi, focal lesion, or hydronephrosis. Bladder is unremarkable. Stomach/Bowel: Stomach is within normal limits. Appendix is surgically absent. Minimal scattered colonic diverticulosis no evidence of bowel wall thickening, distention, or inflammatory changes. Vascular/Lymphatic: Prominent atherosclerotic calcifications throughout the aortoiliac axis. No aneurysm. No abdominopelvic lymphadenopathy. Reproductive: Status post hysterectomy. No adnexal masses. Other: Postsurgical changes from prior ventral hernia repair. Trace fluid inferior to the tip of the right hepatic lobe. No organized abdominopelvic fluid collection.  No pneumoperitoneum. Musculoskeletal: Chronic bilateral L5 pars interarticularis defects with grade 1 anterolisthesis of L5 on S1. No new or acute osseous findings. IMPRESSION: 1. No acute abdominopelvic findings. 2. Trace fluid inferior to the tip of the right hepatic lobe, nonspecific. 3. Slight interval increase in size of left lower lobe pulmonary nodule, now measuring approximately 8 mm. Consider follow-up PET-CT for further characterization. 4. Aortic atherosclerosis (ICD10-I70.0). Electronically Signed   By: Davina Poke D.O.   On: 06/20/2020 16:35   CT Head Wo Contrast  Result Date: 06/20/2020 CLINICAL DATA:  Dizziness and balance issues while standing up since waking this morning. Slurred speech. EXAM: CT HEAD WITHOUT CONTRAST TECHNIQUE: Contiguous axial images were obtained from the base of the skull through the vertex without intravenous contrast. COMPARISON:  Brain MRI 05/07/2019 FINDINGS: Brain: No evidence of acute infarction, hemorrhage, hydrocephalus, extra-axial collection or mass lesion/mass effect. There is mild diffuse low-attenuation within the subcortical and periventricular white matter compatible with chronic microvascular disease. Similar to previous brain MRI. Vascular: No hyperdense vessel or unexpected calcification. Skull: Normal. Negative for fracture or focal lesion. Sinuses/Orbits: The paranasal sinuses and mastoid air cells are clear. Other: None IMPRESSION: 1. No acute intracranial abnormalities. 2. Chronic small vessel ischemic change. Electronically Signed   By: Kerby Moors M.D.   On: 06/20/2020 19:00   MR ANGIO HEAD WO CONTRAST  Addendum Date: 06/21/2020   ADDENDUM REPORT: 06/21/2020 12:55 ADDENDUM: These results were  called by telephone at the time of interpretation on 06/21/2020 at 12:55 pm to Dr. Shelly Coss, who verbally acknowledged these results. Electronically Signed   By: Pedro Earls M.D.   On: 06/21/2020 12:55   Result Date:  06/21/2020 CLINICAL DATA:  Neuro deficit, acute, stroke suspected. EXAM: MRA HEAD WITHOUT CONTRAST TECHNIQUE: Angiographic images of the Circle of Willis were acquired using MRA technique without intravenous contrast. COMPARISON:  Brain MRI Jun 20, 2020. FINDINGS: Anterior circulation: Luminal irregularity at the cavernous segment of the bilateral internal carotid arteries suggesting intracranial atherosclerotic disease with mild stenosis at the bilateral distal cavernous segment. There is also mild stenosis at the origin of the left A1 and left M1 segments (series 101, image 6) and moderate stenosis at the origin of a left M3/MCA superior division branch (series 101, image 3). The MCA and ACA vascular trees are otherwise maintained. Posterior circulation: Diminished flow related enhancement of the proximal V4 segment of the left vertebral artery. This may represent slow flow versus occlusion. However, recent brain MRI showed normal flow void in the segment, raising suspicion for artifact. Mild stenosis of the bilateral vertebral arteries at the vertebrobasilar junction. Mild luminal irregularity of the basilar artery and proximal posterior cerebral arteries without hemodynamically significant stenosis. Other: None. IMPRESSION: 1. Diminished flow related enhancement at the proximal intracranial left vertebral artery may represent slow flow/occlusion versus artifact. 2. Mild stenosis of the bilateral intracranial vertebral arteries at the vertebrobasilar junction. 3. Atherosclerotic changes in the bilateral anterior circulation with mild stenosis at the bilateral cavernous ICAs, mild stenosis at the origin of the left A1 and M1 segments and moderate stenosis at the origin of a left M3/MCA superior division branch. Electronically Signed: By: Pedro Earls M.D. On: 06/21/2020 12:40   MR BRAIN WO CONTRAST  Result Date: 06/20/2020 CLINICAL DATA:  Acute neurologic deficit. Dizziness. Slurred speech.  EXAM: MRI HEAD WITHOUT CONTRAST TECHNIQUE: Multiplanar, multiecho pulse sequences of the brain and surrounding structures were obtained without intravenous contrast. COMPARISON:  05/07/2019 FINDINGS: Brain: No acute infarct, mass effect or extra-axial collection. No acute or chronic hemorrhage. There is multifocal hyperintense T2-weighted signal within the white matter. Parenchymal volume and CSF spaces are normal. the midline structures are normal. Vascular: Major flow voids are preserved. Skull and upper cervical spine: Normal calvarium and skull base. Visualized upper cervical spine and soft tissues are normal. Sinuses/Orbits:No paranasal sinus fluid levels or advanced mucosal thickening. No mastoid or middle ear effusion. Normal orbits. IMPRESSION: 1. No acute intracranial abnormality. 2. Findings of mild chronic small vessel ischemic disease. Electronically Signed   By: Ulyses Jarred M.D.   On: 06/20/2020 21:47   MR ABDOMEN MRCP WO CONTRAST  Result Date: 06/21/2020 CLINICAL DATA:  Abdominal pain, biliary obstruction suspected EXAM: MRI ABDOMEN WITHOUT CONTRAST  (INCLUDING MRCP) TECHNIQUE: Multiplanar multisequence MR imaging of the abdomen was performed. Heavily T2-weighted images of the biliary and pancreatic ducts were obtained, and three-dimensional MRCP images were rendered by post processing. COMPARISON:  CT abdomen pelvis, 06/20/2020 FINDINGS: Lower chest: Heterogeneous airspace disease in the included bilateral lung bases, poorly assessed by MRI. Hepatobiliary: Mild hepatic steatosis. No mass or other parenchymal abnormality identified. No gallstones or gallbladder wall thickening. No biliary ductal dilatation. Normal contour and caliber of the common bile duct to the ampulla. No filling defect or other obstruction. Pancreas: No mass, inflammatory changes, or other parenchymal abnormality identified. No pancreatic ductal dilatation. Spleen:  Within normal limits in size and appearance.  Adrenals/Urinary Tract: No masses identified. No evidence of hydronephrosis. Stomach/Bowel: Visualized portions within the abdomen are unremarkable. Vascular/Lymphatic: No pathologically enlarged lymph nodes identified. No abdominal aortic aneurysm demonstrated. Other:  None. Musculoskeletal: No suspicious bone lesions identified. IMPRESSION: 1. Normal contour and caliber of the common bile duct to the ampulla. No filling defect or other obstruction. No gallstones or gallbladder wall thickening. 2. Mild hepatic steatosis. 3. Heterogeneous airspace disease in the included bilateral lung bases, poorly assessed by MRI. Electronically Signed   By: Eddie Candle M.D.   On: 06/21/2020 11:57   MR 3D Recon At Scanner  Result Date: 06/21/2020 CLINICAL DATA:  Abdominal pain, biliary obstruction suspected EXAM: MRI ABDOMEN WITHOUT CONTRAST  (INCLUDING MRCP) TECHNIQUE: Multiplanar multisequence MR imaging of the abdomen was performed. Heavily T2-weighted images of the biliary and pancreatic ducts were obtained, and three-dimensional MRCP images were rendered by post processing. COMPARISON:  CT abdomen pelvis, 06/20/2020 FINDINGS: Lower chest: Heterogeneous airspace disease in the included bilateral lung bases, poorly assessed by MRI. Hepatobiliary: Mild hepatic steatosis. No mass or other parenchymal abnormality identified. No gallstones or gallbladder wall thickening. No biliary ductal dilatation. Normal contour and caliber of the common bile duct to the ampulla. No filling defect or other obstruction. Pancreas: No mass, inflammatory changes, or other parenchymal abnormality identified. No pancreatic ductal dilatation. Spleen:  Within normal limits in size and appearance. Adrenals/Urinary Tract: No masses identified. No evidence of hydronephrosis. Stomach/Bowel: Visualized portions within the abdomen are unremarkable. Vascular/Lymphatic: No pathologically enlarged lymph nodes identified. No abdominal aortic aneurysm  demonstrated. Other:  None. Musculoskeletal: No suspicious bone lesions identified. IMPRESSION: 1. Normal contour and caliber of the common bile duct to the ampulla. No filling defect or other obstruction. No gallstones or gallbladder wall thickening. 2. Mild hepatic steatosis. 3. Heterogeneous airspace disease in the included bilateral lung bases, poorly assessed by MRI. Electronically Signed   By: Eddie Candle M.D.   On: 06/21/2020 11:57   ECHOCARDIOGRAM COMPLETE  Result Date: 06/21/2020    ECHOCARDIOGRAM REPORT   Patient Name:   [name] Date of Exam: 06/21/2020 Medical Rec #:  [id number]      Height:       65.0 in Accession #:    [id number]     Weight:       233.0 lb Date of Birth:  [birth year]       BSA:          2.111 m Patient Age:    66 years       BP:           108/88 mmHg Patient Gender: F              HR:           83 bpm. Exam Location:  Inpatient Procedure: 2D Echo, Cardiac Doppler and Color Doppler Indications:    TIA G45.9  History:        Patient has prior history of Echocardiogram examinations, most                 recent 01/20/2014. CAD, Stroke, PAD and Carotid Disease; Risk                 Factors:Hypertension, Diabetes and Dyslipidemia. Hypothyroidism.                 GERD.  Sonographer:    Jonelle Sidle Dance Referring Phys: May  1. Left ventricular ejection fraction, by estimation, is 55 to 60%.  The left ventricle has normal function. The left ventricle has no regional wall motion abnormalities. There is mild concentric left ventricular hypertrophy. Left ventricular diastolic parameters are consistent with Grade I diastolic dysfunction (impaired relaxation).  2. Right ventricular systolic function is normal. The right ventricular size is normal.  3. The mitral valve is grossly normal. No evidence of mitral valve regurgitation. No evidence of mitral stenosis.  4. The aortic valve is tricuspid. There is mild thickening of the aortic valve. Aortic valve  regurgitation is not visualized. No aortic stenosis is present.  5. The inferior vena cava is normal in size with greater than 50% respiratory variability, suggesting right atrial pressure of 3 mmHg. Comparison(s): A prior study was performed on 01/20/2014. No significant change from prior study. Prior images reviewed side by side. FINDINGS  Left Ventricle: Left ventricular ejection fraction, by estimation, is 55 to 60%. The left ventricle has normal function. The left ventricle has no regional wall motion abnormalities. The left ventricular internal cavity size was normal in size. There is  mild concentric left ventricular hypertrophy. Left ventricular diastolic parameters are consistent with Grade I diastolic dysfunction (impaired relaxation). Right Ventricle: The right ventricular size is normal. No increase in right ventricular wall thickness. Right ventricular systolic function is normal. Left Atrium: Left atrial size was normal in size. Right Atrium: Right atrial size was normal in size. Pericardium: There is no evidence of pericardial effusion. Mitral Valve: The mitral valve is grossly normal. No evidence of mitral valve regurgitation. No evidence of mitral valve stenosis. Tricuspid Valve: The tricuspid valve is grossly normal. Tricuspid valve regurgitation is not demonstrated. No evidence of tricuspid stenosis. Aortic Valve: The aortic valve is tricuspid. There is mild thickening of the aortic valve. Aortic valve regurgitation is not visualized. No aortic stenosis is present. Pulmonic Valve: The pulmonic valve was grossly normal. Pulmonic valve regurgitation is not visualized. Aorta: The aortic root is normal in size and structure. Venous: The inferior vena cava is normal in size with greater than 50% respiratory variability, suggesting right atrial pressure of 3 mmHg. IAS/Shunts: The atrial septum is grossly normal.  LEFT VENTRICLE PLAX 2D LVIDd:         4.90 cm LVIDs:         3.50 cm LV PW:         1.40 cm  LV IVS:        1.10 cm LVOT diam:     1.70 cm LV SV:         47 LV SV Index:   22 LVOT Area:     2.27 cm  RIGHT VENTRICLE             IVC RV Basal diam:  2.40 cm     IVC diam: 1.70 cm RV S prime:     10.00 cm/s TAPSE (M-mode): 1.9 cm LEFT ATRIUM             Index       RIGHT ATRIUM           Index LA diam:        5.00 cm 2.37 cm/m  RA Area:     17.10 cm LA Vol (A2C):   71.6 ml 33.92 ml/m RA Volume:   40.10 ml  19.00 ml/m LA Vol (A4C):   43.2 ml 20.47 ml/m LA Biplane Vol: 56.2 ml 26.63 ml/m  AORTIC VALVE LVOT Vmax:   92.30 cm/s LVOT Vmean:  65.400 cm/s LVOT VTI:    0.206  m  AORTA Ao Root diam: 3.10 cm Ao Asc diam:  3.20 cm MITRAL VALVE MV Area (PHT): 2.48 cm     SHUNTS MV Decel Time: 306 msec     Systemic VTI:  0.21 m MV E velocity: 58.10 cm/s   Systemic Diam: 1.70 cm MV A velocity: 115.00 cm/s MV E/A ratio:  0.51 Rudean Haskell MD Electronically signed by Rudean Haskell MD Signature Date/Time: 06/21/2020/3:09:59 PM    Final    VAS US CAROTID (at [facility] and [facility] only)  Result Date: 06/21/2020 Carotid Arterial Duplex Study Patient Name:  [name]  Date of Exam:   06/21/2020 Medical Rec #: [id number]       Accession #:    [id number] Date of Birth: 08-12-1955        Patient Gender: F Patient Age:   79Y Exam Location:  [facility] Procedure:      VAS US CAROTID Referring Phys: [id number]ore[address] Lew [facility] --------------------------------------------------------------------------------  Indications:       TIA. Risk Factors:      Hypertension, hyperlipidemia, Diabetes, coronary artery                    disease, PAD. Limitations        Today's exam was limited due to the patient's respiratory                    variation. Comparison Study:  06/11/2019 - Bi 1-39% ICA stenosis Performing Technologist: Oliver Hum RVT  Examination Guidelines: A complete evaluation includes B-mode imaging, spectral Doppler, color Doppler, and power Doppler as needed of all accessible portions of each vessel.  Bilateral testing is considered an integral part of a complete examination. Limited examinations for reoccurring indications may be performed as noted.  Right Carotid Findings: +----------+--------+--------+--------+--------------------------+--------+           PSV cm/sEDV cm/sStenosisPlaque Description        Comments +----------+--------+--------+--------+--------------------------+--------+ CCA Prox  101     17              smooth and heterogenous   tortuous +----------+--------+--------+--------+--------------------------+--------+ CCA Distal62      13              smooth and heterogenous            +----------+--------+--------+--------+--------------------------+--------+ ICA Prox  62      14              irregular and heterogenous         +----------+--------+--------+--------+--------------------------+--------+ ICA Distal76      29                                        tortuous +----------+--------+--------+--------+--------------------------+--------+ ECA       83      12                                                 +----------+--------+--------+--------+--------------------------+--------+ +----------+--------+-------+--------+-------------------+           PSV cm/sEDV cmsDescribeArm Pressure (mmHG) +----------+--------+-------+--------+-------------------+ [id number]                                        +----------+--------+-------+--------+-------------------+ +---------+--------+--+--------+--+---------+  VertebralPSV cm/s80EDV cm/s21Antegrade +---------+--------+--+--------+--+---------+  Left Carotid Findings: +----------+--------+--------+--------+--------------------------+--------+           PSV cm/sEDV cm/sStenosisPlaque Description        Comments +----------+--------+--------+--------+--------------------------+--------+ CCA Prox  79      15              smooth and heterogenous             +----------+--------+--------+--------+--------------------------+--------+ CCA Mid   96      17              irregular and heterogenous         +----------+--------+--------+--------+--------------------------+--------+ CCA Distal64      11              smooth and heterogenous            +----------+--------+--------+--------+--------------------------+--------+ ICA Prox  49      17              smooth and heterogenous            +----------+--------+--------+--------+--------------------------+--------+ ICA Distal56      18                                        tortuous +----------+--------+--------+--------+--------------------------+--------+ ECA       88      11                                                 +----------+--------+--------+--------+--------------------------+--------+ +----------+--------+--------+--------+-------------------+           PSV cm/sEDV cm/sDescribeArm Pressure (mmHG) +----------+--------+--------+--------+-------------------+ Subclavian115                                         +----------+--------+--------+--------+-------------------+ +---------+--------+--+--------+-+---------+ VertebralPSV cm/s20EDV cm/s7Antegrade +---------+--------+--+--------+-+---------+   Summary: Right Carotid: Velocities in the right ICA are consistent with a 1-39% stenosis. Left Carotid: Velocities in the left ICA are consistent with a 1-39% stenosis. Vertebrals: Bilateral vertebral arteries demonstrate antegrade flow. *See table(s) above for measurements and observations.  Electronically signed by Deitra Mayo MD on 06/21/2020 at 1:08:16 PM.    Final      Labs:   Basic Metabolic Panel: Recent Labs  Lab 06/20/20 1210 06/21/20 0507  NA 135  --   K 4.0  --   CL 104  --   CO2 22  --   GLUCOSE 107*  --   BUN 12  --   CREATININE 0.84 0.81  CALCIUM 9.8  --    GFR Estimated Creatinine Clearance: 83.6 mL/min (by C-G formula based  on SCr of 0.81 mg/dL). Liver Function Tests: Recent Labs  Lab 06/20/20 1210  AST 27  ALT 21  ALKPHOS 121  BILITOT 0.9  PROT 8.7*  ALBUMIN 4.0   Recent Labs  Lab 06/20/20 1210  LIPASE 24   No results for input(s): AMMONIA in the last 168 hours. Coagulation profile No results for input(s): INR, PROTIME in the last 168 hours.  CBC: Recent Labs  Lab 06/20/20 1210 06/21/20 0507  WBC 6.2 5.0  HGB 15.5* 14.3  HCT 46.7* 43.2  MCV 82.4 82.8  PLT 242 211   Cardiac Enzymes: No results for input(s):  CKTOTAL, CKMB, CKMBINDEX, TROPONINI in the last 168 hours. BNP: Invalid input(s): POCBNP CBG: Recent Labs  Lab 06/22/20 1251 06/22/20 1615 06/22/20 1819 06/22/20 2129 06/23/20 0620  GLUCAP 107* 155* 100* 81 115*   D-Dimer No results for input(s): DDIMER in the last 72 hours. Hgb A1c Recent Labs    06/21/20 0507  HGBA1C 7.2*   Lipid Profile Recent Labs    06/21/20 0507  CHOL 125  HDL 47  LDLCALC 61  TRIG 83  CHOLHDL 2.7   Thyroid function studies No results for input(s): TSH, T4TOTAL, T3FREE, THYROIDAB in the last 72 hours.  Invalid input(s): FREET3 Anemia work up No results for input(s): VITAMINB12, FOLATE, FERRITIN, TIBC, IRON, RETICCTPCT in the last 72 hours. Microbiology Recent Results (from the past 240 hour(s))  Resp Panel by RT-PCR (Flu A&B, Covid) Nasopharyngeal Swab     Status: None   Collection Time: 06/20/20  7:16 PM   Specimen: Nasopharyngeal Swab; Nasopharyngeal(NP) swabs in vial transport medium  Result Value Ref Range Status   SARS Coronavirus 2 by RT PCR NEGATIVE NEGATIVE Final    Comment: (NOTE) SARS-CoV-2 target nucleic acids are NOT DETECTED.  The SARS-CoV-2 RNA is generally detectable in upper respiratory specimens during the acute phase of infection. The lowest concentration of SARS-CoV-2 viral copies this assay can detect is 138 copies/mL. A negative result does not preclude SARS-Cov-2 infection and should not be used as the sole  basis for treatment or other patient management decisions. A negative result may occur with  improper specimen collection/handling, submission of specimen other than nasopharyngeal swab, presence of viral mutation(s) within the areas targeted by this assay, and inadequate number of viral copies(<138 copies/mL). A negative result must be combined with clinical observations, patient history, and epidemiological information. The expected result is Negative.  Fact Sheet for Patients:  EntrepreneurPulse.com.au  Fact Sheet for Healthcare Providers:  IncredibleEmployment.be  This test is no t yet approved or cleared by the [place] FDA and  has been authorized for detection and/or diagnosis of SARS-CoV-2 by FDA under an Emergency Use Authorization (EUA). This EUA will remain  in effect (meaning this test can be used) for the duration of the COVID-19 declaration under Section 564(b)(1) of the Act, 21 U.S.C.section 360bbb-3(b)(1), unless the authorization is terminated  or revoked sooner.       Influenza A by PCR NEGATIVE NEGATIVE Final   Influenza B by PCR NEGATIVE NEGATIVE Final    Comment: (NOTE) The Xpert Xpress SARS-CoV-2/FLU/RSV plus assay is intended as an aid in the diagnosis of influenza from Nasopharyngeal swab specimens and should not be used as a sole basis for treatment. Nasal washings and aspirates are unacceptable for Xpert Xpress SARS-CoV-2/FLU/RSV testing.  Fact Sheet for Patients: EntrepreneurPulse.com.au  Fact Sheet for Healthcare Providers: IncredibleEmployment.be  This test is not yet approved or cleared by the [place] FDA and has been authorized for detection and/or diagnosis of SARS-CoV-2 by FDA under an Emergency Use Authorization (EUA). This EUA will remain in effect (meaning this test can be used) for the duration of the COVID-19 declaration under Section 564(b)(1) of the Act,  21 U.S.C. section 360bbb-3(b)(1), unless the authorization is terminated or revoked.  Performed at [facility], [place] [address], [place], [place] [address]   Culture, Urine     Status: Abnormal   Collection Time: 06/21/20  4:30 AM   Specimen: Urine, Clean Catch  Result Value Ref Range Status   Specimen Description   Final  URINE, CLEAN CATCH Performed at [facility], [place] [address] [place], [place] [address]    Special Requests   Final    NONE Performed at [facility], [place] [address], [place], [place] [address]    Culture MULTIPLE SPECIES PRESENT, SUGGEST RECOLLECTION (A)  Final   Report Status 06/22/2020 FINAL  Final     Discharge Instructions:   Discharge Instructions    Diet - low sodium heart healthy   Complete by: As directed    Discharge instructions   Complete by: As directed    Follow up with your primary care physician in one week. Seek medical attention for worsening symptoms.   Increase activity slowly   Complete by: As directed      Allergies as of 06/23/2020      Reactions   Strawberry Extract Anaphylaxis   Cymbalta [duloxetine Hcl] Other (See Comments)   Dizzy   Lisinopril Itching, Swelling, Other (See Comments)   Angioedema   Lunesta [eszopiclone] Other (See Comments)   Bad taste in mouth    Metformin And Related Other (See Comments)   Damaged patient's kidneys   Other Other (See Comments)   Pt is a Jehovah Witness. No blood products.   Sulfa Antibiotics Itching, Swelling, Other (See Comments)   Angioedema   Tramadol Other (See Comments)   Hallucinations   Trazodone And Nefazodone Other (See Comments)   Shakes   Penicillins Rash, Other (See Comments)   "Paralyzed" (per patient) Has patient had a PCN reaction causing immediate rash, facial/tongue/throat swelling, SOB or lightheadedness with hypotension: Yes Has patient had a PCN reaction causing severe rash involving mucus membranes or  skin necrosis: No Has patient had a PCN reaction that required hospitalization Yes Has patient had a PCN reaction occurring within the last 10 years: No If all of the above answers are "NO", then may proceed with Cephalosporin use.      Medication List    TAKE these medications   Accu-Chek Guide test strip Generic drug: glucose blood   Accu-Chek Guide w/Device Kit   Accu-Chek Softclix Lancets lancets   amLODipine 10 MG tablet Commonly known as: NORVASC Take 10 mg by mouth.   aspirin EC 81 MG tablet Take 81 mg by mouth daily.   atenolol 100 MG tablet Commonly known as: TENORMIN TAKE 1 TABLET BY MOUTH EVERY DAY   cilostazol 100 MG tablet Commonly known as: PLETAL Take 1 tablet (100 mg total) by mouth 2 (two) times daily.   dicyclomine 10 MG capsule Commonly known as: BENTYL Take 10 mg by mouth 3 (three) times daily before meals.   insulin aspart protamine - aspart (70-30) 100 UNIT/ML FlexPen Commonly known as: NOVOLOG 70/30 MIX Inject 30-50 Units into the skin See admin instructions. 50 units in the morning and 30 units at night.   levothyroxine 175 MCG tablet Commonly known as: SYNTHROID Take 175 mcg by mouth daily before breakfast.   pantoprazole 40 MG tablet Commonly known as: Protonix Take 1 tablet (40 mg total) by mouth daily.   ProAir HFA 108 (90 Base) MCG/ACT inhaler Generic drug: albuterol Inhale 2-3 puffs into the lungs every 6 (six) hours as needed for shortness of breath.   albuterol (2.5 MG/3ML) 0.083% nebulizer solution Commonly known as: PROVENTIL Take 3 mLs (2.5 mg total) by nebulization every 6 (six) hours as needed for wheezing or shortness of breath.   Repatha SureClick 315 MG/ML Soaj Generic drug: Evolocumab INJECT 1 PEN INTO THE SKIN EVERY  14 (FOURTEEN) DAYS. What changed: how much to take   rosuvastatin 40 MG tablet Commonly known as: CRESTOR Take 1 tablet (40 mg total) by mouth daily.   spironolactone 25 MG tablet Commonly known as:  ALDACTONE TAKE 1 TABLET BY MOUTH EVERY DAY   Trulicity 3 QJ/3.3LK Sopn Generic drug: Dulaglutide Inject 4.5 mg into the skin once a week. Friday   zolpidem 10 MG tablet Commonly known as: AMBIEN Take 10 mg by mouth at bedtime.       Follow-up Information    [facility] Follow up.   Specialty: Home Health Services Why: The home health agency will contact you for the first home visit. Contact information: [place] [address] [phone]        Jonathon Jordan, MD. Schedule an appointment as soon as possible for a visit in 1 week(s).   Specialty: Family Medicine Contact information: [place] [address] [phone]        Josue Hector, MD .   Specialty: Cardiology Contact information: [phone] [address] [place] [address] [phone]                Time coordinating discharge: 39 minutes  Signed:  Jon Kasparek  [facility] 06/23/2020, 10:01 AM

## 2020-06-23 NOTE — Progress Notes (Signed)
PROGRESS NOTE  [name] LHT:[id number] DOB: [birth year] DOA: 06/20/2020 PCP: Mila Palmer, MD   LOS: 0 days   Late entry  Brief narrative: [name] is a 65 y.o. female with history of CAD, hypertension, peripheral vascular disease, diabetes mellitus, hypothyroidism presented to hospital with right upper quadrant pain and right flank pain for 2 days without any nausea vomiting diarrhea fever or chills.  She also had dizziness.  In the ED patient had UA that showed leukocyte esterase and squamous cells.  CT scan of the abdomen showed some perihepatic fluid collection but CT head was unremarkable.  There was some concern for TIA.  Patient was then placed in observation.    Assessment/Plan:  Principal Problem:   TIA Active Problems:   Diabetes (HCC)   CAD (coronary artery disease)   Hypothyroidism   ILD (interstitial lung disease) (HCC)   RUQ pain  Possible TIA -ER discussed with on-call neurologist and plan was TIA work-up.  MRI of the brain MRA carotid duplex 2D echocardiogram was performed without any evidence of stroke.  He was noted with total cholesterol of 236 and LDL of 143.  She was 7.2 better than 3 years ago at 7.7.  Patient was continued on aspirin.  Right upper quadrant/right flank pain  MRCP was negative.  She stated the significant GERD and gastritis.  She was initiated on Protonix.    History of CAD patient did not have any chest pain.  She was continued on aspirin, beta-blockers and statins  Peripheral vascular disease on cilostazol.  Diabetes mellitus type 2 on NovoLog 70/30 and sliding scale coverage.  Continue monitoring closely.  Hypothyroidism continue Synthroid  DVT prophylaxis: enoxaparin (LOVENOX) injection 40 mg Start: 06/20/20 2200  Code Status: Full code  Family Communication: None  Status is: Observation  The patient will require care spanning > 2 midnights and should be moved to inpatient because: Ongoing diagnostic testing needed not  appropriate for outpatient work up, IV treatments appropriate due to intensity of illness or inability to take PO and Inpatient level of care appropriate due to severity of illness  Dispo: The patient is from: Home              Anticipated d/c is to: Home              Patient currently is not medically stable to d/c.   Difficult to place patient No   Consultants:  Neurology  Procedures:  None  Anti-infectives:  Marland Kitchen Rocephin IV 5/17>  Anti-infectives (From admission, onward)   Start     Dose/Rate Route Frequency Ordered Stop   06/21/20 0930  cefTRIAXone (ROCEPHIN) 1 g in sodium chloride 0.9 % 100 mL IVPB        1 g 200 mL/hr over 30 Minutes Intravenous Every 24 hours 06/21/20 0916     06/21/20 0430  cefTRIAXone (ROCEPHIN) 1 g in sodium chloride 0.9 % 100 mL IVPB  Status:  Discontinued        1 g 200 mL/hr over 30 Minutes Intravenous Every 24 hours 06/21/20 0429 06/21/20 0439      Subjective: Today, patient was seen and examined at bedside.  Patient complains of mild epigastric pain burning sensation and reflux.  Objective: .vitals Vitals:   06/23/20 0823 06/23/20 1150  BP: 137/72 117/72  Pulse: 75 82  Resp: 18 17  Temp: 98.2 F (36.8 C) 97.9 F (36.6 C)  SpO2: 95% 93%    Intake/Output Summary (Last 24 hours) at  06/23/2020 1525 Last data filed at 06/23/2020 1300 Gross per 24 hour  Intake 770 ml  Output --  Net 770 ml   Filed Weights   06/20/20 1157  Weight: 105.7 kg   Body mass index is 38.77 kg/m.   Physical Exam: GENERAL: Patient is alert awake and oriented. Not in obvious distress.  He is HENT: No scleral pallor or icterus. Pupils equally reactive to light. Oral mucosa is moist NECK: is supple, no gross swelling noted. CHEST: Clear to auscultation. No crackles or wheezes.  Diminished breath sounds bilaterally. CVS: S1 and S2 heard, no murmur.  Nonspecific tenderness over the epigastric region.  Regular rate and rhythm.  ABDOMEN: Soft, non-tender, bowel  sounds are present. EXTREMITIES: No edema. CNS: Cranial nerves are intact. No focal motor deficits. SKIN: warm and dry without rashes.  Data Review: I have personally reviewed the following laboratory data and studies,  CBC: Recent Labs  Lab 06/20/20 1210 06/21/20 0507  WBC 6.2 5.0  HGB 15.5* 14.3  HCT 46.7* 43.2  MCV 82.4 82.8  PLT 242 211   Basic Metabolic Panel: Recent Labs  Lab 06/20/20 1210 06/21/20 0507  NA 135  --   K 4.0  --   CL 104  --   CO2 22  --   GLUCOSE 107*  --   BUN 12  --   CREATININE 0.84 0.81  CALCIUM 9.8  --    Liver Function Tests: Recent Labs  Lab 06/20/20 1210  AST 27  ALT 21  ALKPHOS 121  BILITOT 0.9  PROT 8.7*  ALBUMIN 4.0   Recent Labs  Lab 06/20/20 1210  LIPASE 24   No results for input(s): AMMONIA in the last 168 hours. Cardiac Enzymes: No results for input(s): CKTOTAL, CKMB, CKMBINDEX, TROPONINI in the last 168 hours. BNP (last 3 results) No results for input(s): BNP in the last 8760 hours.  ProBNP (last 3 results) No results for input(s): PROBNP in the last 8760 hours.  CBG: Recent Labs  Lab 06/22/20 1615 06/22/20 1819 06/22/20 2129 06/23/20 0620 06/23/20 1147  GLUCAP 155* 100* 81 115* 152*   Recent Results (from the past 240 hour(s))  Resp Panel by RT-PCR (Flu A&B, Covid) Nasopharyngeal Swab     Status: None   Collection Time: 06/20/20  7:16 PM   Specimen: Nasopharyngeal Swab; Nasopharyngeal(NP) swabs in vial transport medium  Result Value Ref Range Status   SARS Coronavirus 2 by RT PCR NEGATIVE NEGATIVE Final    Comment: (NOTE) SARS-CoV-2 target nucleic acids are NOT DETECTED.  The SARS-CoV-2 RNA is generally detectable in upper respiratory specimens during the acute phase of infection. The lowest concentration of SARS-CoV-2 viral copies this assay can detect is 138 copies/mL. A negative result does not preclude SARS-Cov-2 infection and should not be used as the sole basis for treatment or other patient  management decisions. A negative result may occur with  improper specimen collection/handling, submission of specimen other than nasopharyngeal swab, presence of viral mutation(s) within the areas targeted by this assay, and inadequate number of viral copies(<138 copies/mL). A negative result must be combined with clinical observations, patient history, and epidemiological information. The expected result is Negative.  Fact Sheet for Patients:  BloggerCourse.com  Fact Sheet for Healthcare Providers:  SeriousBroker.it  This test is no t yet approved or cleared by the [place] FDA and  has been authorized for detection and/or diagnosis of SARS-CoV-2 by FDA under an Emergency Use Authorization (EUA). This EUA will remain  in effect (meaning this test can be used) for the duration of the COVID-19 declaration under Section 564(b)(1) of the Act, 21 U.S.C.section 360bbb-3(b)(1), unless the authorization is terminated  or revoked sooner.       Influenza A by PCR NEGATIVE NEGATIVE Final   Influenza B by PCR NEGATIVE NEGATIVE Final    Comment: (NOTE) The Xpert Xpress SARS-CoV-2/FLU/RSV plus assay is intended as an aid in the diagnosis of influenza from Nasopharyngeal swab specimens and should not be used as a sole basis for treatment. Nasal washings and aspirates are unacceptable for Xpert Xpress SARS-CoV-2/FLU/RSV testing.  Fact Sheet for Patients: BloggerCourse.com  Fact Sheet for Healthcare Providers: SeriousBroker.it  This test is not yet approved or cleared by the [place] FDA and has been authorized for detection and/or diagnosis of SARS-CoV-2 by FDA under an Emergency Use Authorization (EUA). This EUA will remain in effect (meaning this test can be used) for the duration of the COVID-19 declaration under Section 564(b)(1) of the Act, 21 U.S.C. section 360bbb-3(b)(1),  unless the authorization is terminated or revoked.  Performed at [facility], [address] [place], [place] [address]   Culture, Urine     Status: Abnormal   Collection Time: 06/21/20  4:30 AM   Specimen: Urine, Clean Catch  Result Value Ref Range Status   Specimen Description   Final    URINE, CLEAN CATCH Performed at [facility], [address], [place], [place] [address]    Special Requests   Final    NONE Performed at [facility], [address] [place], [place] [address]    Culture MULTIPLE SPECIES PRESENT, SUGGEST RECOLLECTION (A)  Final   Report Status 06/22/2020 FINAL  Final  Culture, Urine     Status: None   Collection Time: 06/22/20  2:56 PM   Specimen: Urine, Clean Catch  Result Value Ref Range Status   Specimen Description URINE, CLEAN CATCH  Final   Special Requests NONE  Final   Culture   Final    NO GROWTH Performed at [facility], [address], [place], [place] [address]    Report Status 06/23/2020 FINAL  Final     Studies: No results found.   Joycelyn Das, MD  [facility] 06/23/2020  If 7PM-7AM, please contact night-coverage

## 2020-06-23 NOTE — Progress Notes (Signed)
Initial Nutrition Assessment  DOCUMENTATION CODES:  Obesity unspecified  INTERVENTION:  -Glucerna Shake po TID, each supplement provides 220 kcal and 10 grams of protein -67ml Prosource Plus po BID, each supplement provides 100 kcals and 15 grams of protein -MVI with minerals daily  NUTRITION DIAGNOSIS:  Inadequate oral intake related to decreased appetite as evidenced by other (comment) (meal completion <75%).  GOAL:  Patient will meet greater than or equal to 90% of their needs  MONITOR:  PO intake,Supplement acceptance,Labs,Weight trends,I & O's  REASON FOR ASSESSMENT:  Malnutrition Screening Tool   ASSESSMENT:  Pt with PMH including DM, CAD, hypothyroidism, HTN, h/o stroke, arthritis, HLD, PAD admitted with migraine/TIA.  Pt has been cleared from a neurology stand point. Note pt is still OBS status with possible discharge today. Pt to d/c home with home health services.   Pt unavailable at time of RD visit. Discussed pt with RN who reports pt with fair appetite, 0-100% intake x3 recorded meals (66% average meal intake).  Per weight readings, pt weighed 120.7 kg on 11/10/19, 109.8kg on 03/11/20, and 105.7kg upon admission on 06/20/20. This indicates pt's weight has been stable x3 months, but pt did experience a clinically significant 9% weight loss x4 months between Oct 2021 and Feb 2022. Pt needs nutrition-focused physical exam to determine if malnutrition can be diagnosed. Will attempt at follow-up.   UOP: x24 hours I/O: since admit   Medications: SSI, novolog 70/30 mix Labs reviewed. CBGs [phone]  Diet Order:   Diet Order            Diet - low sodium heart healthy           Diet Heart Room service appropriate? Yes; Fluid consistency: Thin  Diet effective now                EDUCATION NEEDS:  No education needs have been identified at this time  Skin:  Skin Assessment: Reviewed RN Assessment  Last BM:  5/18  Height:  Ht Readings from Last 1  Encounters:  06/20/20 5\' 5"  (1.651 m)   Weight:  Wt Readings from Last 1 Encounters:  06/20/20 105.7 kg   Ideal Body Weight:  56.82 kg  BMI:  Body mass index is 38.77 kg/m.  Estimated Nutritional Needs:  Kcal:  1800-2000 Protein:  115-130g Fluid:  >2L    06/22/20, MS, RD, LDN RD pager number and weekend/on-call pager number located in Amion.

## 2020-06-23 NOTE — TOC Transition Note (Signed)
Transition of Care [facility]) - CM/SW Discharge Note   Patient Details  Name: JOELEEN WORTLEY MRN: [id number] Date of Birth: 11/01/1955  Transition of Care [facility] CM/SW Contact:  Kermit Balo, RN Phone Number: 06/23/2020, 10:14 AM   Clinical Narrative:    Patient is discharging home with home health services through [facility]. Cory with [facility] aware of d/c home. Pt has transportation home.   Final next level of care: Home w Home Health Services Barriers to Discharge: No Barriers Identified   Patient Goals and CMS Choice   CMS Medicare.gov Compare Post Acute Care list provided to:: Patient Choice offered to / list presented to : Patient  Discharge Placement                       Discharge Plan and Services   Discharge Planning Services: CM Consult Post Acute Care Choice: Home Health                    HH Arranged: PT,OT [facility] Agency: [facility] Date [facility] Agency Contacted: 06/22/20   Representative spoke with at [facility] Agency: Kandee Keen  Social Determinants of Health (SDOH) Interventions     Readmission Risk Interventions No flowsheet data found.

## 2020-06-23 NOTE — Progress Notes (Signed)
Physical Therapy Treatment Patient Details Name: [name] MRN: [id number] DOB: 12/05/1955 Today's Date: 06/23/2020    History of Present Illness 65 y/o female admitted 06/20/20 presenting with 3 day hx of R upper quadrant/flank pain, transient dysarthria. CT and MRI brain with no acute abnormalities. MRA shows decreased flow L vertebral artery. Per neurology cerebral hypoperfusion without infarct involving the posterior circulation due to vertebral artery stenosis.  PMH includes: CAD, arthritis, DM, HTN, PAD, SBO, CVA.    PT Comments    Patient progressing towards physical therapy goals. Patient ambulated 250' with no AD and progressed to supervision. Difficult to assess cognition with no family/caregiver present. Continue to recommend HHPT following discharge to maximize functional mobility and safety.     Follow Up Recommendations  Home health PT     Equipment Recommendations  None recommended by PT    Recommendations for Other Services       Precautions / Restrictions Precautions Precautions: Fall Precaution Comments: monitor telemetry Restrictions Weight Bearing Restrictions: No    Mobility  Bed Mobility Overal bed mobility: Modified Independent                  Transfers Overall transfer level: Needs assistance Equipment used: None Transfers: Sit to/from Stand Sit to Stand: Supervision         General transfer comment: supervision for safety  Ambulation/Gait Ambulation/Gait assistance: Min guard;Supervision Gait Distance (Feet): 250 Feet Assistive device: None Gait Pattern/deviations: Step-through pattern;Wide base of support;Decreased stride length Gait velocity: reduced   General Gait Details: initially min guard progressing to supervision for safety. Initially reaching for objects but improved with increasing ambulation distance.   Stairs             Wheelchair Mobility    Modified Rankin (Stroke Patients Only)       Balance  Overall balance assessment: Mild deficits observed, not formally tested                                          Cognition Arousal/Alertness: Awake/alert Behavior During Therapy: WFL for tasks assessed/performed Overall Cognitive Status: No family/caregiver present to determine baseline cognitive functioning                                        Exercises      General Comments        Pertinent Vitals/Pain Pain Assessment: No/denies pain    Home Living                      Prior Function            PT Goals (current goals can now be found in the care plan section) Acute Rehab PT Goals Patient Stated Goal: to get home PT Goal Formulation: With patient Time For Goal Achievement: 06/29/20 Potential to Achieve Goals: Good Progress towards PT goals: Progressing toward goals    Frequency    Min 3X/week      PT Plan Current plan remains appropriate    Co-evaluation              AM-PAC PT "6 Clicks" Mobility   Outcome Measure  Help needed turning from your back to your side while in a flat bed without using bedrails?: None Help needed moving from  lying on your back to sitting on the side of a flat bed without using bedrails?: A Little Help needed moving to and from a bed to a chair (including a wheelchair)?: A Little Help needed standing up from a chair using your arms (e.g., wheelchair or bedside chair)?: A Little Help needed to walk in hospital room?: A Little Help needed climbing 3-5 steps with a railing? : A Little 6 Click Score: 19    End of Session Equipment Utilized During Treatment: Gait belt Activity Tolerance: Patient tolerated treatment well Patient left: in bed;with call bell/phone within reach Nurse Communication: Mobility status PT Visit Diagnosis: Unsteadiness on feet (R26.81);Muscle weakness (generalized) (M62.81)     Time: 3354-5625 PT Time Calculation (min) (ACUTE ONLY): 17 min  Charges:   $Gait Training: 8-22 mins                     [name] A. Dan Humphreys PT, DPT Acute Rehabilitation Services Pager [phone] Office [phone]    [name] 06/23/2020, 11:31 AM
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
## 2020-06-23 NOTE — Progress Notes (Signed)
Patient given discharge instructions and verbalized understanding. Instructed patient to monitor blood sugars as ordered and administered insulin as ordered. Patient verbalized understanding.

## 2020-06-23 NOTE — Discharge Instructions (Signed)
Flank Pain, Adult Flank pain is pain that is located on the side of the body between the upper abdomen and the back. This area is called the flank. The pain may occur over a short period of time (acute), or it may be long-term or recurring (chronic). It may be mild or severe. Flank pain can be caused by many things, including:  Muscle soreness or injury.  Kidney stones or kidney disease.  Stress.  A disease of the spine (vertebral disk disease).  A lung infection (pneumonia).  Fluid around the lungs (pulmonary edema).  A skin rash caused by the chickenpox virus (shingles).  Tumors that affect the back of the abdomen.  Gallbladder disease. Follow these instructions at home:  Drink enough fluid to keep your urine clear or pale yellow.  Rest as told by your health care provider.  Take over-the-counter and prescription medicines only as told by your health care provider.  Keep a journal to track what has caused your flank pain and what has made it feel better.  Keep all follow-up visits as told by your health care provider. This is important.   Contact a health care provider if:  Your pain is not controlled with medicine.  You have new symptoms.  Your pain gets worse.  You have a fever.  Your symptoms last longer than 2-3 days.  You have trouble urinating or you are urinating very frequently. Get help right away if:  You have trouble breathing or you are short of breath.  Your abdomen hurts or it is swollen or red.  You have nausea or vomiting.  You feel faint or you pass out.  You have blood in your urine. Summary  Flank pain is pain that is located on the side of the body between the upper abdomen and the back.  The pain may occur over a short period of time (acute), or it may be long-term or recurring (chronic). It may be mild or severe.  Flank pain can be caused by many things.  Contact your health care provider if your symptoms get worse or they last  longer than 2-3 days. This information is not intended to replace advice given to you by your health care provider. Make sure you discuss any questions you have with your health care provider. Document Revised: 10/16/2019 Document Reviewed: 10/16/2019 Elsevier Patient Education  2021 [employer]   Flank Pain, Adult Flank pain is pain in your side. The flank is the area of your side between your upper belly (abdomen) and your back. The pain may occur over a short time (acute), or it may be long-term or come back often (chronic). It may be mild or very bad. Pain in this area can be caused by many different things. Follow these instructions at home:  Drink enough fluid to keep your pee (urine) clear or pale yellow.  Rest as told by your doctor.  Take over-the-counter and prescription medicines only as told by your doctor.  Keep a journal to keep track of: ? What has caused your flank pain. ? What has made it feel better.  Keep all follow-up visits as told by your doctor. This is important.   Contact a doctor if:  Medicine does not help your pain.  You have new symptoms.  Your pain gets worse.  You have a fever.  Your symptoms last longer than 2-3 days.  You have trouble peeing.  You are peeing more often than normal. Get help right away if:  You have trouble breathing.  You are short of breath.  Your belly hurts, or it is swollen or red.  You feel sick to your stomach (nauseous).  You throw up (vomit).  You feel like you will pass out, or you do pass out (faint).  You have blood in your pee. Summary  Flank pain is pain in your side. The flank is the area of your side between your upper belly (abdomen) and your back.  Flank pain may occur over a short time (acute), or it may be long-term or come back often (chronic). It may be mild or very bad.  Pain in this area can be caused by many different things.  Contact your doctor if your symptoms get worse or they  last longer than 2-3 days. This information is not intended to replace advice given to you by your health care provider. Make sure you discuss any questions you have with your health care provider. Document Revised: 10/16/2019 Document Reviewed: 10/16/2019 Elsevier Patient Education  2021 [employer].

## 2020-06-23 NOTE — Progress Notes (Signed)
Occupational Therapy Treatment Patient Details Name: [name] MRN: [id number] DOB: [birth year] Today's Date: 06/23/2020    History of present illness 65 y/o female admitted 06/20/20 presenting with 3 day hx of R upper quadrant/flank pain, transient dysarthria. CT and MRI brain with no acute abnormalities. MRA shows decreased flow L vertebral artery. Per neurology cerebral hypoperfusion without infarct involving the posterior circulation due to vertebral artery stenosis.  PMH includes: CAD, arthritis, DM, HTN, PAD, SBO, CVA.   OT comments  Session focused on pill box to assess functional cognition.  General Comments: Assessed using the Pill Box Test. Pt failed the assessment, demonstrating poor planning, mental flexibility, suboptimal search strategies, concrete thinking and the inability to multitask. Pt had a total of 7 errors, where more than 3 errors is considered a fail. When educated on mistakes, pt appears to understand but requires max assist to correct errors.  Patient reports son will assist with med mgmt at dc.  Educated on compensatory strategies for memory, pt voiced understanding.  Continue to recommend HHOT at this time.     Follow Up Recommendations  Home health OT;Supervision - Intermittent    Equipment Recommendations  3 in 1 bedside commode    Recommendations for Other Services      Precautions / Restrictions Precautions Precautions: Fall Precaution Comments: monitor telemetry Restrictions Weight Bearing Restrictions: No       Mobility Bed Mobility Overal bed mobility: Modified Independent                  Transfers Overall transfer level: Needs assistance Equipment used: None Transfers: Sit to/from Stand Sit to Stand: Supervision         General transfer comment: supervision for safety    Balance Overall balance assessment: Mild deficits observed, not formally tested                                         ADL either  performed or assessed with clinical judgement   ADL                                               Vision       Perception     Praxis      Cognition Arousal/Alertness: Awake/alert Behavior During Therapy: WFL for tasks assessed/performed Overall Cognitive Status: Impaired/Different from baseline Area of Impairment: Memory;Awareness;Problem solving;Attention                   Current Attention Level: Sustained Memory: Decreased short-term memory     Awareness: Emergent Problem Solving: Requires verbal cues;Difficulty sequencing General Comments: engaged in pill box test. see below     Assessed using the Pill Box Test. Pt failed the assessment, demonstrating poor planning, mental flexibility, suboptimal search strategies, concrete thinking and the inability to multitask. Pt had a total of 7 errors, where more than 3 errors is considered a fail.  Errors: One tablet 3x/day (yellow) - 0 errors (omission/misplacement) One tablet 2x/day with breakfast and dinner (green) - 0 errors (omission/misplacement) One tablet in the morning (Blue)- 0 errors (omission/misplacement) One tablet daily at bedtime (orange) - 0errors(omission/misplacement) One tablet every other day (red) - 1 errors (omission/misplacement)   Number of misplaced movement errors (pills placed in incorrect compartment)- 5 Number  of total errors (sum of omissions; misplacements) - 0 Total time to complete task (allowed 5 min) - 10 min  Patient demonstrates good ability to complete daily pills if 1-2x/day, but struggled problem solving the every other day medication.  Pt placing medication along first row (but inconsistent with placement put in Sunday, Tuesday, wed) then skipped to Sunday on the next column.  Appeared to understand mistake, but unable to correct without max assist.      Exercises     Shoulder Instructions       General Comments      Pertinent Vitals/ Pain       Pain  Assessment: No/denies pain  Home Living                                          Prior Functioning/Environment              Frequency  Min 2X/week        Progress Toward Goals  OT Goals(current goals can now be found in the care plan section)  Progress towards OT goals: Progressing toward goals  Acute Rehab OT Goals Patient Stated Goal: to get home OT Goal Formulation: With patient  Plan Discharge plan remains appropriate;Frequency remains appropriate    Co-evaluation                 AM-PAC OT "6 Clicks" Daily Activity     Outcome Measure   Help from another person eating meals?: None Help from another person taking care of personal grooming?: A Little Help from another person toileting, which includes using toliet, bedpan, or urinal?: A Little Help from another person bathing (including washing, rinsing, drying)?: A Little Help from another person to put on and taking off regular upper body clothing?: A Little Help from another person to put on and taking off regular lower body clothing?: A Little 6 Click Score: 19    End of Session    OT Visit Diagnosis: Other abnormalities of gait and mobility (R26.89);Muscle weakness (generalized) (M62.81);Other symptoms and signs involving cognitive function   Activity Tolerance Patient tolerated treatment well   Patient Left with call bell/phone within reach (seated EOB)   Nurse Communication Mobility status        Time: 3976-7341 OT Time Calculation (min): 25 min  Charges: OT General Charges $OT Visit: 1 Visit OT Treatments $Cognitive Funtion inital: Initial 15 mins $Cognitive Funtion additional: Additional15 mins  Barry Brunner, OT Acute Rehabilitation Services Pager [phone] Office [phone]    Chancy Milroy 06/23/2020, 1:41 PM

## 2020-06-27 ENCOUNTER — Other Ambulatory Visit: Payer: Self-pay | Admitting: Cardiovascular Disease

## 2020-07-05 DIAGNOSIS — G459 Transient cerebral ischemic attack, unspecified: Secondary | ICD-10-CM | POA: Diagnosis not present

## 2020-07-05 DIAGNOSIS — I672 Cerebral atherosclerosis: Secondary | ICD-10-CM | POA: Diagnosis not present

## 2020-07-05 DIAGNOSIS — Z79899 Other long term (current) drug therapy: Secondary | ICD-10-CM | POA: Diagnosis not present

## 2020-07-13 ENCOUNTER — Ambulatory Visit (INDEPENDENT_AMBULATORY_CARE_PROVIDER_SITE_OTHER): Payer: Medicare HMO | Admitting: Plastic Surgery

## 2020-07-13 ENCOUNTER — Other Ambulatory Visit: Payer: Self-pay

## 2020-07-13 ENCOUNTER — Encounter: Payer: Self-pay | Admitting: Plastic Surgery

## 2020-07-13 VITALS — BP 136/79 | HR 80 | Ht 65.0 in | Wt 235.6 lb

## 2020-07-13 DIAGNOSIS — M793 Panniculitis, unspecified: Secondary | ICD-10-CM
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
## 2020-07-20 DIAGNOSIS — R3915 Urgency of urination: Secondary | ICD-10-CM | POA: Diagnosis not present

## 2020-07-20 DIAGNOSIS — R300 Dysuria: Secondary | ICD-10-CM | POA: Diagnosis not present

## 2020-07-21 DIAGNOSIS — G4733 Obstructive sleep apnea (adult) (pediatric): Secondary | ICD-10-CM | POA: Diagnosis not present

## 2020-07-28 DIAGNOSIS — M65332 Trigger finger, left middle finger: Secondary | ICD-10-CM | POA: Diagnosis not present

## 2020-07-28 DIAGNOSIS — M79642 Pain in left hand: Secondary | ICD-10-CM | POA: Diagnosis not present

## 2020-07-29 ENCOUNTER — Telehealth: Payer: Self-pay | Admitting: *Deleted

## 2020-07-29 DIAGNOSIS — Z79899 Other long term (current) drug therapy: Secondary | ICD-10-CM | POA: Diagnosis not present

## 2020-07-29 DIAGNOSIS — E7800 Pure hypercholesterolemia, unspecified: Secondary | ICD-10-CM | POA: Diagnosis not present

## 2020-07-29 DIAGNOSIS — Z794 Long term (current) use of insulin: Secondary | ICD-10-CM | POA: Diagnosis not present

## 2020-07-29 DIAGNOSIS — E1121 Type 2 diabetes mellitus with diabetic nephropathy: Secondary | ICD-10-CM | POA: Diagnosis not present

## 2020-07-29 DIAGNOSIS — E559 Vitamin D deficiency, unspecified: Secondary | ICD-10-CM | POA: Diagnosis not present

## 2020-07-29 DIAGNOSIS — I10 Essential (primary) hypertension: Secondary | ICD-10-CM | POA: Diagnosis not present

## 2020-07-29 DIAGNOSIS — E039 Hypothyroidism, unspecified: Secondary | ICD-10-CM | POA: Diagnosis not present

## 2020-07-29 DIAGNOSIS — G459 Transient cerebral ischemic attack, unspecified: Secondary | ICD-10-CM | POA: Diagnosis not present

## 2020-07-29 DIAGNOSIS — Z0000 Encounter for general adult medical examination without abnormal findings: Secondary | ICD-10-CM | POA: Diagnosis not present

## 2020-07-29 DIAGNOSIS — E1140 Type 2 diabetes mellitus with diabetic neuropathy, unspecified: Secondary | ICD-10-CM | POA: Diagnosis not present

## 2020-07-29 NOTE — Telephone Encounter (Signed)
   Primary Cardiologist: Amanda Haws, MD  Chart reviewed as part of pre-operative protocol coverage. Given past medical history and time since last visit, based on ACC/AHA guidelines, [name] would be at acceptable risk for the planned procedure without further cardiovascular testing.   Her RCRI is a class II risk, 0.9% risk of major cardiac event.  She is able to complete greater than 4 METS of physical activity.  Patient was advised that if she develops new symptoms prior to surgery to contact our office to arrange a follow-up appointment.  He verbalized understanding.  I will route this recommendation to the requesting party via Epic fax function and remove from pre-op pool.  Please call with questions.  Thomasene Ripple. Damarkus Balis NP-C    07/29/2020, 2:40 PM [facility] [address] Office [phone] Fax [phone]

## 2020-07-29 NOTE — Telephone Encounter (Signed)
   [facility] Pre-operative Risk Assessment    Patient Name: [name]  DOB: 1956/01/30  MRN: [id number]   HEARTCARE STAFF: - Please ensure there is not already an duplicate clearance open for this procedure. - Under Visit Info/Reason for Call, type in Other and utilize the format Clearance MM/DD/YY or Clearance TBD. Do not use dashes or single digits. - If request is for dental extraction, please clarify the # of teeth to be extracted. - If the patient is currently at the dentist's office, call Pre-Op APP to address. If the patient is not currently in the dentist office, please route to the Pre-Op pool  Request for surgical clearance:  What type of surgery is being performed? Infraumbilical panniculectomy   When is this surgery scheduled? 09/19/20   What type of clearance is required (medical clearance vs. Pharmacy clearance to hold med vs. Both)? medical  Are there any medications that need to be held prior to surgery and how long?none   Practice name and name of physician performing surgery? Dr Silverio [facility]   What is the office phone number? [phone]   7.   What is the office fax number? [phone]  8.   Anesthesia type (None, local, MAC, general) ? general   Fredia Beets 07/29/2020, 2:13 PM  _________________________________________________________________   (provider comments below)

## 2020-08-11 DIAGNOSIS — Z1211 Encounter for screening for malignant neoplasm of colon: Secondary | ICD-10-CM | POA: Diagnosis not present

## 2020-08-20 DIAGNOSIS — G4733 Obstructive sleep apnea (adult) (pediatric): Secondary | ICD-10-CM | POA: Diagnosis not present

## 2020-08-25 DIAGNOSIS — M65332 Trigger finger, left middle finger: Secondary | ICD-10-CM | POA: Diagnosis not present

## 2020-08-25 DIAGNOSIS — G5602 Carpal tunnel syndrome, left upper limb: Secondary | ICD-10-CM | POA: Diagnosis not present

## 2020-08-31 DIAGNOSIS — R300 Dysuria: Secondary | ICD-10-CM | POA: Diagnosis not present

## 2020-09-02 DIAGNOSIS — E039 Hypothyroidism, unspecified: Secondary | ICD-10-CM | POA: Diagnosis not present

## 2020-09-06 ENCOUNTER — Encounter: Payer: Self-pay | Admitting: Surgical

## 2020-09-06 ENCOUNTER — Other Ambulatory Visit: Payer: Self-pay

## 2020-09-06 ENCOUNTER — Ambulatory Visit (INDEPENDENT_AMBULATORY_CARE_PROVIDER_SITE_OTHER): Payer: Medicare HMO | Admitting: Surgical

## 2020-09-06 VITALS — BP 147/87 | HR 77 | Ht 65.0 in | Wt 242.0 lb

## 2020-09-06 DIAGNOSIS — M793 Panniculitis, unspecified: Secondary | ICD-10-CM

## 2020-09-06 DIAGNOSIS — E039 Hypothyroidism, unspecified: Secondary | ICD-10-CM | POA: Diagnosis not present

## 2020-09-06 MED ORDER — ONDANSETRON HCL 4 MG PO TABS: 4.0000 mg | ORAL_TABLET | Freq: Three times a day (TID) | ORAL | 0 refills | Status: DC | PRN

## 2020-09-06 MED ORDER — HYDROCODONE-ACETAMINOPHEN 5-325 MG PO TABS: 1.0000 | ORAL_TABLET | Freq: Four times a day (QID) | ORAL | 0 refills | Status: AC | PRN

## 2020-09-06 NOTE — Telephone Encounter (Addendum)
Dr. Kirke Corin, surgeon's office is asking [place] to clarify if pletal can be held prior to her surgery. Online search indicate pletal is usually held 3 days prior to surgery, is this ok with you?

## 2020-09-06 NOTE — Telephone Encounter (Signed)
I s/w the pt and informed her that she will need to hold Pletal x 3 days prior to her breast reduction surgery. Pt is grateful for the call and the help. I will send updated notes to surgeon's office.
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
## 2020-09-06 NOTE — Telephone Encounter (Signed)
Discussed with Dr. Kirke Corin, ok to hold pletal/cilostazol for 3 days prior to the procedure and restart as soon as possible at the discretion of surgeon
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
## 2020-09-12 ENCOUNTER — Other Ambulatory Visit: Payer: Self-pay | Admitting: Cardiovascular Disease

## 2020-09-12 DIAGNOSIS — G5602 Carpal tunnel syndrome, left upper limb: Secondary | ICD-10-CM | POA: Diagnosis not present

## 2020-09-12 NOTE — Pre-Procedure Instructions (Addendum)
Surgical Instructions:    Your procedure is scheduled on Monday, August 15th (1:15 PM- 2:45 PM).  Report to [facility] Main Entrance "A" at 11:15 A.M., then check in with the Admitting office.  Call this number if you have any questions prior to your surgery date, or have problems the morning of surgery:  [phone]    Remember:  Do not eat after midnight the night before your surgery.  You may drink clear liquids until 10:15 AM the morning of your surgery.   Clear liquids allowed are: Water, Non-Citrus Juices (without pulp), Carbonated Beverages, Clear Tea, Black Coffee Only, and Gatorade.     Take these medicines the morning of surgery with A SIP OF WATER:   amLODipine (NORVASC) atenolol (TENORMIN) levothyroxine (SYNTHROID) rosuvastatin (CRESTOR)   IF NEEDED: ondansetron (ZOFRAN)   As of today, STOP taking any Aspirin (unless otherwise instructed by your surgeon) and cilostazol (PLETAL), Aleve, Naproxen, Ibuprofen, Motrin, Advil, Goody's, BC's, all herbal medications, fish oil, and all vitamins.            WHAT DO I DO ABOUT MY DIABETES MEDICATION?  THE NIGHT BEFORE SURGERY: Take 14 units of NOVOLOG 70/30 MIX insulin.       THE MORNING OF SURGERY: Do not take any insulin.  Do not take other diabetes injectables, including Trulicity (dulaglutide).   HOW TO MANAGE YOUR DIABETES BEFORE AND AFTER SURGERY  Why is it important to control my blood sugar before and after surgery? Improving blood sugar levels before and after surgery helps healing and can limit problems. A way of improving blood sugar control is eating a healthy diet by:  Eating less sugar and carbohydrates  Increasing activity/exercise  Talking with your doctor about reaching your blood sugar goals High blood sugars (greater than 180 mg/dL) can raise your risk of infections and slow your recovery, so you will need to focus on controlling your diabetes during the weeks before surgery. Make sure that the  doctor who takes care of your diabetes knows about your planned surgery including the date and location.  How do I manage my blood sugar before surgery? Check your blood sugar at least 4 times a day, starting 2 days before surgery, to make sure that the level is not too high or low.  Check your blood sugar the morning of your surgery when you wake up and every 2 hours until you get to the Short Stay unit.  If your blood sugar is less than 70 mg/dL, you will need to treat for low blood sugar: Do not take insulin. Treat a low blood sugar (less than 70 mg/dL) with  cup of clear juice (cranberry or apple), 4 glucose tablets, OR glucose gel. Recheck blood sugar in 15 minutes after treatment (to make sure it is greater than 70 mg/dL). If your blood sugar is not greater than 70 mg/dL on recheck, call [phone] for further instructions. Report your blood sugar to the short stay nurse when you get to Short Stay.  If you are admitted to the hospital after surgery: Your blood sugar will be checked by the staff and you will probably be given insulin after surgery (instead of oral diabetes medicines) to make sure you have good blood sugar levels. The goal for blood sugar control after surgery is 80-180 mg/dL.    Special instructions:    [place]- Preparing For Surgery  Before surgery, you can play an important role. Because skin is not sterile, your skin needs to be as  free of germs as possible. You can reduce the number of germs on your skin by washing with CHG (chlorahexidine gluconate) Soap before surgery.  CHG is an antiseptic cleaner which kills germs and bonds with the skin to continue killing germs even after washing.     Please do not use if you have an allergy to CHG or antibacterial soaps. If your skin becomes reddened/irritated stop using the CHG.  Do not shave (including legs and underarms) for at least 48 hours prior to first CHG shower. It is OK to shave your face.  Please follow  these instructions carefully.     Shower the NIGHT BEFORE SURGERY and the MORNING OF SURGERY with CHG Soap.   If you chose to wash your hair, wash your hair first as usual with your normal shampoo. After you shampoo, rinse your hair and body thoroughly to remove the shampoo.  Then [employer] and genitals (private parts) with your normal soap and rinse thoroughly to remove soap.  After that Use CHG Soap as you would any other liquid soap. You can apply CHG directly to the skin and wash gently with a scrungie or a clean washcloth.   Apply the CHG Soap to your body ONLY FROM THE NECK DOWN.  Do not use on open wounds or open sores. Avoid contact with your eyes, ears, mouth and genitals (private parts). Wash Face and genitals (private parts)  with your normal soap.   Wash thoroughly, paying special attention to the area where your surgery will be performed.  Thoroughly rinse your body with warm water from the neck down.  DO NOT shower/wash with your normal soap after using and rinsing off the CHG Soap.  Pat yourself dry with a CLEAN TOWEL.  Wear CLEAN PAJAMAS to bed the night before surgery  Place CLEAN SHEETS on your bed the night before your surgery  DO NOT SLEEP WITH PETS.   Day of Surgery:  Take a shower with CHG soap. Wear Clean/Comfortable clothing the morning of surgery Do not wear lotions, powders, perfumes/colognes, or deodorant.   Remember to brush your teeth WITH YOUR REGULAR TOOTHPASTE. Do not wear jewelry or makeup. DO Not wear nail polish, gel polish, artificial nails, or any other type of covering on natural nails including finger and toenails. If patients have artificial nails, gel coating, etc. that need to be removed by a nail salon please have this removed prior to surgery or surgery may need to be canceled/delayed if the surgeon/ anesthesia feels like the patient is unable to be adequately monitored. Do not shave 48 hours prior to surgery.  Do not bring valuables to  the hospital. [facility] is not responsible for any belongings or valuables.  Do NOT Smoke (Tobacco/Vaping) or drink Alcohol 24 hours prior to your procedure.  If you use a CPAP at night, you may bring all equipment for your overnight stay.   Contacts, glasses, dentures or bridgework may not be worn into surgery, please bring cases for these belongings.   For patients admitted to the hospital, discharge time will be determined by your treatment team.  Patients discharged the day of surgery will not be allowed to drive home, and someone needs to stay with them for 24 hours.  ONLY 1 SUPPORT PERSON MAY BE PRESENT WHILE YOU ARE IN SURGERY. IF YOU ARE TO BE ADMITTED ONCE YOU ARE IN YOUR ROOM YOU WILL BE ALLOWED TWO (2) VISITORS.  Minor children may have two parents present. Special consideration for  safety and communication needs will be reviewed on a case by case basis.     Please read over the following fact sheets that you were given.

## 2020-09-12 NOTE — Telephone Encounter (Signed)
Refill request

## 2020-09-13 ENCOUNTER — Other Ambulatory Visit: Payer: Self-pay

## 2020-09-13 ENCOUNTER — Encounter (HOSPITAL_COMMUNITY): Payer: Self-pay

## 2020-09-13 ENCOUNTER — Encounter (HOSPITAL_COMMUNITY)
Admission: RE | Admit: 2020-09-13 | Discharge: 2020-09-13 | Disposition: A | Discharge status: Home / Self Care | Payer: Medicare HMO | Source: Ambulatory Visit | Attending: Plastic Surgery | Admitting: Plastic Surgery

## 2020-09-13 DIAGNOSIS — I2510 Atherosclerotic heart disease of native coronary artery without angina pectoris: Secondary | ICD-10-CM | POA: Insufficient documentation

## 2020-09-13 DIAGNOSIS — I70213 Atherosclerosis of native arteries of extremities with intermittent claudication, bilateral legs: Secondary | ICD-10-CM | POA: Insufficient documentation

## 2020-09-13 DIAGNOSIS — G4733 Obstructive sleep apnea (adult) (pediatric): Secondary | ICD-10-CM | POA: Diagnosis not present

## 2020-09-13 DIAGNOSIS — I10 Essential (primary) hypertension: Secondary | ICD-10-CM | POA: Insufficient documentation

## 2020-09-13 DIAGNOSIS — I779 Disorder of arteries and arterioles, unspecified: Secondary | ICD-10-CM | POA: Insufficient documentation

## 2020-09-13 DIAGNOSIS — E1151 Type 2 diabetes mellitus with diabetic peripheral angiopathy without gangrene: Secondary | ICD-10-CM | POA: Insufficient documentation

## 2020-09-13 DIAGNOSIS — I4581 Long QT syndrome: Secondary | ICD-10-CM | POA: Insufficient documentation

## 2020-09-13 DIAGNOSIS — E785 Hyperlipidemia, unspecified: Secondary | ICD-10-CM | POA: Diagnosis not present

## 2020-09-13 DIAGNOSIS — Z79899 Other long term (current) drug therapy: Secondary | ICD-10-CM | POA: Insufficient documentation

## 2020-09-13 DIAGNOSIS — Z01812 Encounter for preprocedural laboratory examination: Secondary | ICD-10-CM | POA: Diagnosis not present

## 2020-09-13 DIAGNOSIS — Z8673 Personal history of transient ischemic attack (TIA), and cerebral infarction without residual deficits: Secondary | ICD-10-CM | POA: Diagnosis not present

## 2020-09-13 HISTORY — DX: Sleep apnea, unspecified: G47.30

## 2020-09-13 HISTORY — DX: Pneumonia, unspecified organism: J18.9

## 2020-09-13 LAB — BASIC METABOLIC PANEL
Anion gap: 8 (ref 5–15)
BUN: 9 mg/dL (ref 8–23)
CO2: 22 mmol/L (ref 22–32)
Calcium: 9.4 mg/dL (ref 8.9–10.3)
Chloride: 105 mmol/L (ref 98–111)
Creatinine, Ser: 0.93 mg/dL (ref 0.44–1.00)
GFR, Estimated: >60 mL/min (ref >60)
Glucose, Bld: 121 mg/dL — ABNORMAL HIGH (ref 70–99)
Potassium: 3.7 mmol/L (ref 3.5–5.1)
Sodium: 135 mmol/L (ref 135–145)

## 2020-09-13 LAB — CBC
HCT: 47.3 % — ABNORMAL HIGH (ref 36.0–46.0)
Hemoglobin: 15.9 g/dL — ABNORMAL HIGH (ref 12.0–15.0)
MCH: 28 pg (ref 26.0–34.0)
MCHC: 33.6 g/dL (ref 30.0–36.0)
MCV: 83.3 fL (ref 80.0–100.0)
Platelets: 248 10*3/uL (ref 150–400)
RBC: 5.68 MIL/uL — ABNORMAL HIGH (ref 3.87–5.11)
RDW: 14.6 % (ref 11.5–15.5)
WBC: 5.5 10*3/uL (ref 4.0–10.5)
nRBC: 0 % (ref 0.0–0.2)

## 2020-09-13 LAB — HEMOGLOBIN A1C
Hgb A1c MFr Bld: 7.5 % — ABNORMAL HIGH (ref 4.8–5.6)
Mean Plasma Glucose: 168.55 mg/dL

## 2020-09-13 LAB — GLUCOSE, CAPILLARY: Glucose-Capillary: 200 mg/dL — ABNORMAL HIGH (ref 70–99)

## 2020-09-13 NOTE — Progress Notes (Addendum)
PCP - Mila Palmer, MD w/ [facility]; records requested Cardiologist - Lorine Bears, MD; Charlton Haws, MD  PPM/ICD - Denies  Chest x-ray - N/A EKG - 06/20/20 Stress Test - 03/27/17 ECHO - 06/21/20 Cardiac Cath - 01/26/14  Sleep Study - Yes, positive for OSA CPAP - No  Fasting Blood Sugar - 120's Checks Blood Sugar 3 times a day. CBG @ PAT was 200 (non fasting). A1C obtained.  Blood Thinner Instructions: N/A Aspirin & Pletal Instructions: Per pt, stop 5 days prior to sx.   **Pt is Jehovah's witness and therefore refuses blood in event transfusion is necessary during surgery. Pt stated she will sign blood refusal form DOS as she needs time to consult with her religious leader about whether or not she can take albumin.   ERAS Protcol - Yes PRE-SURGERY Ensure or G2- Not ordered  COVID TEST- N/A; Ambulatory sx.   Anesthesia review: Yes, cardiac hx.  Patient denies shortness of breath, fever, cough and chest pain at PAT appointment   All instructions explained to the patient, with a verbal understanding of the material. Patient agrees to go over the instructions while at home for a better understanding.  The opportunity to ask questions was provided.

## 2020-09-14 NOTE — Progress Notes (Addendum)
Anesthesia Chart Review:  Follows with cardiology for history of CAD managed medically, carotid artery disease status post left CEA, HTN, HLD, CVA 2014, prolonged QT interval, PAD with bilateral calf claudication.  Last seen by Dr. Kirke Corin on 05/10/2020.  Per note, she had some improvement in PAD symptoms with cilostazol as well as increased physical activity and exercise.  Her symptoms were not felt to be lifestyle limiting, continued medical therapy was recommended.  Her CAD was also noted to be stable, no angina.  Preop cardiac clearance per telephone encounter 07/29/2020, "Chart reviewed as part of pre-operative protocol coverage. Given past medical history and time since last visit, based on ACC/AHA guidelines, [name] would be at acceptable risk for the planned procedure without further cardiovascular testing.  Her RCRI is a class II risk, 0.9% risk of major cardiac event.  She is able to complete greater than 4 METS of physical activity."    Recently evaluated by pulmonology for nodule seen on chest imaging.  Last seen by Dr. Delton Coombes on 11/04/2019.  Per note, nodules were stable and plan was to repeat CT in 1 year. She was also noted to have normal PFTs on 11/04/2019.    OSA, not on CPAP.  Patient is Jehovah's Witness and refuses blood products.  Patient reports she was instructed to hold cilostazol 5 days prior to surgery.  IDDM2, A1c 7.5 on preop labs.  Remainder of preop labs unremarkable.  EKG 06/20/20: Sinus rhythm. Rate 71. Consider LAE.  TTE 06/21/2020:  1. Left ventricular ejection fraction, by estimation, is 55 to 60%. The  left ventricle has normal function. The left ventricle has no regional  wall motion abnormalities. There is mild concentric left ventricular  hypertrophy. Left ventricular diastolic  parameters are consistent with Grade I diastolic dysfunction (impaired  relaxation).   2. Right ventricular systolic function is normal. The right ventricular  size is normal.    3. The mitral valve is grossly normal. No evidence of mitral valve  regurgitation. No evidence of mitral stenosis.   4. The aortic valve is tricuspid. There is mild thickening of the aortic  valve. Aortic valve regurgitation is not visualized. No aortic stenosis is  present.   5. The inferior vena cava is normal in size with greater than 50%  respiratory variability, suggesting right atrial pressure of 3 mmHg.   Comparison(s): A prior study was performed on 01/20/2014. No significant  change from prior study. Prior images reviewed side by side.   Carotid duplex 06/21/2020: Summary:  Right Carotid: Velocities in the right ICA are consistent with a 1-39% stenosis.   Left Carotid: Velocities in the left ICA are consistent with a 1-39% stenosis.   Vertebrals: Bilateral vertebral arteries demonstrate antegrade flow.  Nuclear stress 03/27/2017: Nuclear stress EF: 62%. There was no ST segment deviation noted during stress. No T wave inversion was noted during stress. The study is normal. This is a low risk study. The left ventricular ejection fraction is normal (55-65%).    [name] [facility] Short Stay Center/Anesthesiology Phone [phone] 09/14/2020 1:26 PM

## 2020-09-14 NOTE — Anesthesia Preprocedure Evaluation (Addendum)
Anesthesia Evaluation  Patient identified by MRN, date of birth, ID band Patient awake    Reviewed: Allergy & Precautions, NPO status , Patient's Chart, lab work & pertinent test results  Airway Mallampati: I  TM Distance: >3 FB Neck ROM: Full    Dental  (+) Edentulous Upper, Edentulous Lower   Pulmonary asthma , sleep apnea , former smoker,    breath sounds clear to auscultation       Cardiovascular hypertension, Pt. on medications and Pt. on home beta blockers + CAD and + Peripheral Vascular Disease   Rhythm:Regular Rate:Normal     Neuro/Psych  Headaches, Anxiety TIACVA    GI/Hepatic Neg liver ROS, GERD  Medicated,  Endo/Other  diabetes, Type 2, Insulin DependentHypothyroidism   Renal/GU Renal disease     Musculoskeletal  (+) Arthritis ,   Abdominal Normal abdominal exam  (+)   Peds  Hematology   Anesthesia Other Findings   Reproductive/Obstetrics                            Anesthesia Physical Anesthesia Plan  ASA: 3  Anesthesia Plan: General   Post-op Pain Management:    Induction: Intravenous  PONV Risk Score and Plan: 4 or greater and Ondansetron, Dexamethasone, Midazolam and Treatment may vary due to age or medical condition  Airway Management Planned: Oral ETT  Additional Equipment: None  Intra-op Plan:   Post-operative Plan: Extubation in OR  Informed Consent:   Plan Discussed with: CRNA  Anesthesia Plan Comments: (PAT note by Antionette Poles, PA-C: Follows with cardiology for history of CAD managed medically, carotid artery disease status post left CEA, HTN, HLD, CVA 2014, prolonged QT interval, PAD with bilateral calf claudication.  Last seen by Dr. Kirke Corin on 05/10/2020.  Per note, she had some improvement in PAD symptoms with cilostazol as well as increased physical activity and exercise.  Her symptoms were not felt to be lifestyle limiting, continued medical therapy was  recommended.  Her CAD was also noted to be stable, no angina.  Preop cardiac clearance per telephone encounter 07/29/2020, "Chart reviewed as part of pre-operative protocol coverage. Given past medical history and time since last visit, based on ACC/AHA guidelines,Amanda A Jamisonwould be at acceptable risk for the planned procedure without further cardiovascular testing. Her RCRI is a class II risk, 0.9% risk of major cardiac event. She is able to complete greater than 4 METS of physical activity."    Recently evaluated by pulmonology for nodule seen on chest imaging.  Last seen by Dr. Delton Coombes on 11/04/2019.  Per note, nodules were stable and plan was to repeat CT in 1 year. She was also noted to have normal PFTs on 11/04/2019.    OSA, not on CPAP.  Patient is Jehovah's Witness and refuses blood products.  Patient reports she was instructed to hold cilostazol 5 days prior to surgery.  IDDM2, A1c 7.5 on preop labs.  Remainder of preop labs unremarkable.  EKG 06/20/20: Sinus rhythm. Rate 71. Consider LAE.  TTE 06/21/2020: 1. Left ventricular ejection fraction, by estimation, is 55 to 60%. The  left ventricle has normal function. The left ventricle has no regional  wall motion abnormalities. There is mild concentric left ventricular  hypertrophy. Left ventricular diastolic  parameters are consistent with Grade I diastolic dysfunction (impaired  relaxation).  2. Right ventricular systolic function is normal. The right ventricular  size is normal.  3. The mitral valve is grossly normal. No evidence  of mitral valve  regurgitation. No evidence of mitral stenosis.  4. The aortic valve is tricuspid. There is mild thickening of the aortic  valve. Aortic valve regurgitation is not visualized. No aortic stenosis is  present.  5. The inferior vena cava is normal in size with greater than 50%  respiratory variability, suggesting right atrial pressure of 3 mmHg.   Comparison(s): A prior study was  performed on 01/20/2014. No significant  change from prior study. Prior images reviewed side by side.   Carotid duplex 06/21/2020: Summary:  Right Carotid: Velocities in the right ICA are consistent with a 1-39% stenosis.   Left Carotid: Velocities in the left ICA are consistent with a 1-39% stenosis.   Vertebrals: Bilateral vertebral arteries demonstrate antegrade flow.  Nuclear stress 03/27/2017: . Nuclear stress EF: 62%. . There was no ST segment deviation noted during stress. Marland Kitchen No T wave inversion was noted during stress. . The study is normal. . This is a low risk study. . The left ventricular ejection fraction is normal (55-65%).  )       Anesthesia Quick Evaluation

## 2020-09-16 DIAGNOSIS — G4733 Obstructive sleep apnea (adult) (pediatric): Secondary | ICD-10-CM | POA: Diagnosis not present

## 2020-09-19 ENCOUNTER — Other Ambulatory Visit: Payer: Self-pay

## 2020-09-19 ENCOUNTER — Encounter (HOSPITAL_COMMUNITY): Payer: Self-pay | Admitting: Plastic Surgery

## 2020-09-19 ENCOUNTER — Ambulatory Visit (HOSPITAL_COMMUNITY): Payer: Medicare HMO | Admitting: Physician Assistant

## 2020-09-19 ENCOUNTER — Ambulatory Visit (HOSPITAL_COMMUNITY): Payer: Medicare HMO

## 2020-09-19 ENCOUNTER — Encounter (HOSPITAL_COMMUNITY)
Admission: RE | Disposition: A | Discharge status: Home / Self Care | Payer: Self-pay | Source: Ambulatory Visit | Attending: Plastic Surgery

## 2020-09-19 ENCOUNTER — Ambulatory Visit (HOSPITAL_COMMUNITY)
Admission: RE | Admit: 2020-09-19 | Discharge: 2020-09-19 | Disposition: A | Discharge status: Home / Self Care | Payer: Medicare HMO | Source: Ambulatory Visit | Attending: Plastic Surgery | Admitting: Plastic Surgery

## 2020-09-19 DIAGNOSIS — Z90710 Acquired absence of both cervix and uterus: Secondary | ICD-10-CM | POA: Diagnosis not present

## 2020-09-19 DIAGNOSIS — E1100 Type 2 diabetes mellitus with hyperosmolarity without nonketotic hyperglycemic-hyperosmolar coma (NKHHC): Secondary | ICD-10-CM | POA: Diagnosis not present

## 2020-09-19 DIAGNOSIS — E1151 Type 2 diabetes mellitus with diabetic peripheral angiopathy without gangrene: Secondary | ICD-10-CM | POA: Insufficient documentation

## 2020-09-19 DIAGNOSIS — N179 Acute kidney failure, unspecified: Secondary | ICD-10-CM | POA: Diagnosis not present

## 2020-09-19 DIAGNOSIS — Z833 Family history of diabetes mellitus: Secondary | ICD-10-CM | POA: Diagnosis not present

## 2020-09-19 DIAGNOSIS — Z8249 Family history of ischemic heart disease and other diseases of the circulatory system: Secondary | ICD-10-CM | POA: Insufficient documentation

## 2020-09-19 DIAGNOSIS — Z8673 Personal history of transient ischemic attack (TIA), and cerebral infarction without residual deficits: Secondary | ICD-10-CM | POA: Insufficient documentation

## 2020-09-19 DIAGNOSIS — E785 Hyperlipidemia, unspecified: Secondary | ICD-10-CM | POA: Insufficient documentation

## 2020-09-19 DIAGNOSIS — Z882 Allergy status to sulfonamides status: Secondary | ICD-10-CM | POA: Insufficient documentation

## 2020-09-19 DIAGNOSIS — Z888 Allergy status to other drugs, medicaments and biological substances status: Secondary | ICD-10-CM | POA: Insufficient documentation

## 2020-09-19 DIAGNOSIS — I779 Disorder of arteries and arterioles, unspecified: Secondary | ICD-10-CM | POA: Diagnosis not present

## 2020-09-19 DIAGNOSIS — Z823 Family history of stroke: Secondary | ICD-10-CM | POA: Diagnosis not present

## 2020-09-19 DIAGNOSIS — I2510 Atherosclerotic heart disease of native coronary artery without angina pectoris: Secondary | ICD-10-CM | POA: Insufficient documentation

## 2020-09-19 DIAGNOSIS — Z79890 Hormone replacement therapy (postmenopausal): Secondary | ICD-10-CM | POA: Diagnosis not present

## 2020-09-19 DIAGNOSIS — Z87891 Personal history of nicotine dependence: Secondary | ICD-10-CM | POA: Diagnosis not present

## 2020-09-19 DIAGNOSIS — Z880 Allergy status to penicillin: Secondary | ICD-10-CM | POA: Insufficient documentation

## 2020-09-19 DIAGNOSIS — E039 Hypothyroidism, unspecified: Secondary | ICD-10-CM | POA: Diagnosis not present

## 2020-09-19 DIAGNOSIS — I10 Essential (primary) hypertension: Secondary | ICD-10-CM | POA: Insufficient documentation

## 2020-09-19 DIAGNOSIS — Z7982 Long term (current) use of aspirin: Secondary | ICD-10-CM | POA: Diagnosis not present

## 2020-09-19 DIAGNOSIS — M793 Panniculitis, unspecified: Secondary | ICD-10-CM | POA: Insufficient documentation

## 2020-09-19 DIAGNOSIS — G4730 Sleep apnea, unspecified: Secondary | ICD-10-CM | POA: Diagnosis not present

## 2020-09-19 DIAGNOSIS — Z794 Long term (current) use of insulin: Secondary | ICD-10-CM | POA: Insufficient documentation

## 2020-09-19 DIAGNOSIS — Z79899 Other long term (current) drug therapy: Secondary | ICD-10-CM | POA: Diagnosis not present

## 2020-09-19 HISTORY — PX: PANNICULECTOMY: SHX5360

## 2020-09-19 LAB — GLUCOSE, CAPILLARY
Glucose-Capillary: 211 mg/dL — ABNORMAL HIGH (ref 70–99)
Glucose-Capillary: 175 mg/dL — ABNORMAL HIGH (ref 70–99)
Glucose-Capillary: 125 mg/dL — ABNORMAL HIGH (ref 70–99)

## 2020-09-19 SURGERY — PANNICULECTOMY
Anesthesia: General | Site: Abdomen

## 2020-09-19 MED ORDER — FENTANYL CITRATE (PF) 250 MCG/5ML IJ SOLN
INTRAMUSCULAR | Status: AC
  Filled 2020-09-19: qty 5

## 2020-09-19 MED ORDER — LIDOCAINE 2% (20 MG/ML) 5 ML SYRINGE
INTRAMUSCULAR | Status: AC
  Filled 2020-09-19: qty 5

## 2020-09-19 MED ORDER — SODIUM BICARBONATE 4.2 % IV SOLN
INTRAVENOUS | Status: DC
  Filled 2020-09-19 (×2): qty 50

## 2020-09-19 MED ORDER — PROPOFOL 10 MG/ML IV BOLUS
INTRAVENOUS | Status: DC | PRN
  Administered 2020-09-19: 150 mg via INTRAVENOUS

## 2020-09-19 MED ORDER — OXYCODONE HCL 5 MG/5ML PO SOLN: 5.0000 mg | Freq: Once | ORAL | Status: DC | PRN

## 2020-09-19 MED ORDER — ACETAMINOPHEN 10 MG/ML IV SOLN: 1000.0000 mg | Freq: Once | INTRAVENOUS | Status: DC | PRN

## 2020-09-19 MED ORDER — SODIUM CHLORIDE 0.9 % IV SOLN: INTRAVENOUS | Status: DC

## 2020-09-19 MED ORDER — CHLORHEXIDINE GLUCONATE CLOTH 2 % EX PADS: 6.0000 | MEDICATED_PAD | Freq: Once | CUTANEOUS | Status: DC

## 2020-09-19 MED ORDER — LACTATED RINGERS IV SOLN: INTRAVENOUS | Status: DC

## 2020-09-19 MED ORDER — PROMETHAZINE HCL 25 MG/ML IJ SOLN: 6.2500 mg | INTRAMUSCULAR | Status: DC | PRN

## 2020-09-19 MED ORDER — ONDANSETRON HCL 4 MG/2ML IJ SOLN
INTRAMUSCULAR | Status: DC | PRN
  Administered 2020-09-19: 4 mg via INTRAVENOUS

## 2020-09-19 MED ORDER — OXYCODONE HCL 5 MG PO TABS: 5.0000 mg | ORAL_TABLET | Freq: Once | ORAL | Status: DC | PRN

## 2020-09-19 MED ORDER — PHENYLEPHRINE 40 MCG/ML (10ML) SYRINGE FOR IV PUSH (FOR BLOOD PRESSURE SUPPORT)
PREFILLED_SYRINGE | INTRAVENOUS | Status: AC
  Filled 2020-09-19: qty 10

## 2020-09-19 MED ORDER — ORAL CARE MOUTH RINSE: 15.0000 mL | Freq: Once | OROMUCOSAL | Status: AC

## 2020-09-19 MED ORDER — ALBUTEROL SULFATE HFA 108 (90 BASE) MCG/ACT IN AERS
INHALATION_SPRAY | RESPIRATORY_TRACT | Status: DC | PRN
  Administered 2020-09-19: 4 via RESPIRATORY_TRACT

## 2020-09-19 MED ORDER — FENTANYL CITRATE (PF) 100 MCG/2ML IJ SOLN
INTRAMUSCULAR | Status: DC | PRN
  Administered 2020-09-19 (×2): 50 ug via INTRAVENOUS

## 2020-09-19 MED ORDER — 0.9 % SODIUM CHLORIDE (POUR BTL) OPTIME
TOPICAL | Status: DC | PRN
  Administered 2020-09-19: 1000 mL

## 2020-09-19 MED ORDER — DEXAMETHASONE SODIUM PHOSPHATE 10 MG/ML IJ SOLN
INTRAMUSCULAR | Status: DC | PRN
  Administered 2020-09-19: 8 mg via INTRAVENOUS

## 2020-09-19 MED ORDER — ACETAMINOPHEN 160 MG/5ML PO SOLN: 325.0000 mg | ORAL | Status: DC | PRN

## 2020-09-19 MED ORDER — ALBUTEROL SULFATE HFA 108 (90 BASE) MCG/ACT IN AERS
INHALATION_SPRAY | RESPIRATORY_TRACT | Status: AC
  Filled 2020-09-19: qty 6.7

## 2020-09-19 MED ORDER — MIDAZOLAM HCL 5 MG/5ML IJ SOLN
INTRAMUSCULAR | Status: DC | PRN
  Administered 2020-09-19: 2 mg via INTRAVENOUS

## 2020-09-19 MED ORDER — ACETAMINOPHEN 10 MG/ML IV SOLN
INTRAVENOUS | Status: AC
  Filled 2020-09-19: qty 100

## 2020-09-19 MED ORDER — FENTANYL CITRATE (PF) 100 MCG/2ML IJ SOLN: 25.0000 ug | INTRAMUSCULAR | Status: DC | PRN

## 2020-09-19 MED ORDER — PROPOFOL 10 MG/ML IV BOLUS
INTRAVENOUS | Status: AC
  Filled 2020-09-19: qty 40

## 2020-09-19 MED ORDER — ONDANSETRON HCL 4 MG/2ML IJ SOLN
INTRAMUSCULAR | Status: AC
  Filled 2020-09-19: qty 2

## 2020-09-19 MED ORDER — SODIUM BICARBONATE 4.2 % IV SOLN
INTRAVENOUS | Status: DC | PRN
  Administered 2020-09-19: 250 mL via INTRAMUSCULAR
  Administered 2020-09-19: 500 mL via INTRAMUSCULAR

## 2020-09-19 MED ORDER — CHLORHEXIDINE GLUCONATE 0.12 % MT SOLN
15.0000 mL | Freq: Once | OROMUCOSAL | Status: AC
  Administered 2020-09-19: 15 mL via OROMUCOSAL
  Filled 2020-09-19: qty 15

## 2020-09-19 MED ORDER — ACETAMINOPHEN 325 MG PO TABS: 325.0000 mg | ORAL_TABLET | ORAL | Status: DC | PRN

## 2020-09-19 MED ORDER — ACETAMINOPHEN 10 MG/ML IV SOLN
INTRAVENOUS | Status: DC | PRN
  Administered 2020-09-19: 1000 mg via INTRAVENOUS

## 2020-09-19 MED ORDER — AMISULPRIDE (ANTIEMETIC) 5 MG/2ML IV SOLN: 10.0000 mg | Freq: Once | INTRAVENOUS | Status: DC | PRN

## 2020-09-19 MED ORDER — LIDOCAINE 2% (20 MG/ML) 5 ML SYRINGE
INTRAMUSCULAR | Status: DC | PRN
  Administered 2020-09-19: 40 mg via INTRAVENOUS

## 2020-09-19 MED ORDER — MIDAZOLAM HCL 2 MG/2ML IJ SOLN
INTRAMUSCULAR | Status: AC
  Filled 2020-09-19: qty 2

## 2020-09-19 MED ORDER — CLINDAMYCIN PHOSPHATE 900 MG/50ML IV SOLN
900.0000 mg | INTRAVENOUS | Status: AC
  Administered 2020-09-19: 900 mg via INTRAVENOUS
  Filled 2020-09-19: qty 50

## 2020-09-19 MED ORDER — TRANEXAMIC ACID-NACL 1000-0.7 MG/100ML-% IV SOLN
1000.0000 mg | INTRAVENOUS | Status: AC
  Administered 2020-09-19: 1000 mg via INTRAVENOUS
  Filled 2020-09-19: qty 100

## 2020-09-19 MED ORDER — DEXAMETHASONE SODIUM PHOSPHATE 10 MG/ML IJ SOLN
INTRAMUSCULAR | Status: AC
  Filled 2020-09-19: qty 1

## 2020-09-19 MED ORDER — ROCURONIUM BROMIDE 10 MG/ML (PF) SYRINGE
PREFILLED_SYRINGE | INTRAVENOUS | Status: DC | PRN
  Administered 2020-09-19: 60 mg via INTRAVENOUS

## 2020-09-19 MED ORDER — ROCURONIUM BROMIDE 10 MG/ML (PF) SYRINGE
PREFILLED_SYRINGE | INTRAVENOUS | Status: AC
  Filled 2020-09-19: qty 10

## 2020-09-19 MED ORDER — PHENYLEPHRINE 40 MCG/ML (10ML) SYRINGE FOR IV PUSH (FOR BLOOD PRESSURE SUPPORT)
PREFILLED_SYRINGE | INTRAVENOUS | Status: DC | PRN
  Administered 2020-09-19: 80 ug via INTRAVENOUS

## 2020-09-19 SURGICAL SUPPLY — 68 items
ADH SKN CLS APL DERMABOND .7 (GAUZE/BANDAGES/DRESSINGS)
APL PRP STRL LF DISP 70% ISPRP (MISCELLANEOUS) ×1
APL SKNCLS STERI-STRIP NONHPOA (GAUZE/BANDAGES/DRESSINGS) ×1
APPLIER CLIP 9.375 MED OPEN (MISCELLANEOUS)
APR CLP MED 9.3 20 MLT OPN (MISCELLANEOUS)
BAG COUNTER SPONGE SURGICOUNT (BAG) ×2 IMPLANT
BAG SPNG CNTER NS LX DISP (BAG) ×1
BENZOIN TINCTURE PRP APPL 2/3 (GAUZE/BANDAGES/DRESSINGS) ×2 IMPLANT
BINDER ABDOMINAL 12 ML 46-62 (SOFTGOODS) ×2 IMPLANT
BIOPATCH RED 1 DISK 7.0 (GAUZE/BANDAGES/DRESSINGS) ×4 IMPLANT
BLADE CLIPPER SURG (BLADE) IMPLANT
BLADE SURG 10 STRL SS (BLADE) ×1 IMPLANT
BLADE SURG 11 STRL SS (BLADE) IMPLANT
BLADE SURG 15 STRL LF DISP TIS (BLADE) IMPLANT
BLADE SURG 15 STRL SS (BLADE)
CANISTER SUCT 3000ML PPV (MISCELLANEOUS) ×2 IMPLANT
CHLORAPREP W/TINT 26 (MISCELLANEOUS) ×2 IMPLANT
CLIP APPLIE 9.375 MED OPEN (MISCELLANEOUS) IMPLANT
CONT SPEC PATH 64OZ SNAP LID (MISCELLANEOUS) ×1 IMPLANT
COVER SURGICAL LIGHT HANDLE (MISCELLANEOUS) ×2 IMPLANT
DERMABOND ADVANCED (GAUZE/BANDAGES/DRESSINGS)
DERMABOND ADVANCED .7 DNX12 (GAUZE/BANDAGES/DRESSINGS) ×1 IMPLANT
DRAIN CHANNEL 15F RND FF W/TCR (WOUND CARE) ×2 IMPLANT
DRAIN JP 15F RND TROCAR (DRAIN) ×2 IMPLANT
DRAPE HALF SHEET 40X57 (DRAPES) IMPLANT
DRAPE TOP ARMCOVERS (MISCELLANEOUS) ×1 IMPLANT
DRAPE U-SHAPE 76X120 STRL (DRAPES) ×4 IMPLANT
DRAPE UTILITY XL STRL (DRAPES) IMPLANT
DRSG PAD ABDOMINAL 8X10 ST (GAUZE/BANDAGES/DRESSINGS) ×4 IMPLANT
DRSG TEGADERM 4X4.75 (GAUZE/BANDAGES/DRESSINGS) ×4 IMPLANT
ELECT BLADE 4.0 EZ CLEAN MEGAD (MISCELLANEOUS) ×2
ELECT COATED BLADE 2.86 ST (ELECTRODE) ×2 IMPLANT
ELECT REM PT RETURN 9FT ADLT (ELECTROSURGICAL) ×2
ELECTRODE BLDE 4.0 EZ CLN MEGD (MISCELLANEOUS) ×1 IMPLANT
ELECTRODE REM PT RTRN 9FT ADLT (ELECTROSURGICAL) ×1 IMPLANT
EVACUATOR SILICONE 100CC (DRAIN) ×2 IMPLANT
GAUZE SPONGE 4X4 12PLY STRL (GAUZE/BANDAGES/DRESSINGS) ×4 IMPLANT
GAUZE XEROFORM 1X8 LF (GAUZE/BANDAGES/DRESSINGS) ×1 IMPLANT
GLOVE SRG 8 PF TXTR STRL LF DI (GLOVE) ×1 IMPLANT
GLOVE SURG ENC TEXT LTX SZ7.5 (GLOVE) ×2 IMPLANT
GLOVE SURG UNDER POLY LF SZ8 (GLOVE) ×2
GOWN STRL REUS W/ TWL LRG LVL3 (GOWN DISPOSABLE) ×2 IMPLANT
GOWN STRL REUS W/TWL LRG LVL3 (GOWN DISPOSABLE) ×4
KIT BASIN OR (CUSTOM PROCEDURE TRAY) ×2 IMPLANT
NS IRRIG 1000ML POUR BTL (IV SOLUTION) ×2 IMPLANT
PACK GENERAL/GYN (CUSTOM PROCEDURE TRAY) ×2 IMPLANT
PAD ABD 8X10 STRL (GAUZE/BANDAGES/DRESSINGS) ×4 IMPLANT
PENCIL SMOKE EVACUATOR (MISCELLANEOUS) ×1 IMPLANT
PIN SAFETY STERILE (MISCELLANEOUS) ×1 IMPLANT
SHEET MEDIUM DRAPE 40X70 STRL (DRAPES) IMPLANT
SLEEVE SCD COMPRESS KNEE MED (STOCKING) ×2 IMPLANT
SPONGE T-LAP 18X18 ~~LOC~~+RFID (SPONGE) ×3 IMPLANT
STAPLER INSORB 30 2030 C-SECTI (MISCELLANEOUS) ×4 IMPLANT
STRIP CLOSURE SKIN 1/2X4 (GAUZE/BANDAGES/DRESSINGS) ×3 IMPLANT
SUT ETHILON 3 0 PS 1 (SUTURE) ×3 IMPLANT
SUT MNCRL AB 3-0 PS2 27 (SUTURE) ×2 IMPLANT
SUT MNCRL AB 4-0 PS2 18 (SUTURE) IMPLANT
SUT MON AB 5-0 PS2 18 (SUTURE) IMPLANT
SUT PDS AB 0 CT 36 (SUTURE) IMPLANT
SUT PDS AB 2-0 CT2 27 (SUTURE) ×2 IMPLANT
SUT VIC AB 2-0 CT1 27 (SUTURE) ×4
SUT VIC AB 2-0 CT1 TAPERPNT 27 (SUTURE) ×4 IMPLANT
SUT VIC AB 2-0 CTX 27 (SUTURE) ×5 IMPLANT
SUT VLOC 90 P-14 23 (SUTURE) ×4 IMPLANT
SYR BULB IRRIG 60ML STRL (SYRINGE) ×1 IMPLANT
TRAY FOLEY W/BAG SLVR 16FR (SET/KITS/TRAYS/PACK)
TRAY FOLEY W/BAG SLVR 16FR ST (SET/KITS/TRAYS/PACK) IMPLANT
UNDERPAD 30X36 HEAVY ABSORB (UNDERPADS AND DIAPERS) ×2 IMPLANT

## 2020-09-19 NOTE — Transfer of Care (Signed)
Immediate Anesthesia Transfer of Care Note  Patient: [name]  Procedure(s) Performed: Infraumbilical panniculectomy (Abdomen)  Patient Location: PACU  Anesthesia Type:General  Level of Consciousness: drowsy and patient cooperative  Airway & Oxygen Therapy: Patient Spontanous Breathing and Patient connected to nasal cannula oxygen  Post-op Assessment: Report given to RN and Post -op Vital signs reviewed and stable  Post vital signs: Reviewed and stable  Last Vitals:  Vitals Value Taken Time  BP 136/74 09/19/20 1605  Temp    Pulse 66 09/19/20 1609  Resp 21 09/19/20 1609  SpO2 97 % 09/19/20 1609  Vitals shown include unvalidated device data.  Last Pain:  Vitals:   09/19/20 1134  TempSrc:   PainSc: 0-No pain         Complications: No notable events documented.

## 2020-09-19 NOTE — Discharge Instructions (Addendum)
Activity As tolerated: NO showers until 3 days after surgery. Keep binder on 24/7 unless showering or changing dressings. This is important to prevent additional swelling.  NO driving while in pain, taking pain medication or if you are unable to safely react to traffic. No heavy activities. No lifting > 15 pounds.  SLEEP IN A RECLINED POSITION  Take Pain medication (Norco) as needed for severe pain. Otherwise, you can use ibuprofen or tylenol PRN as well as indicated on your pre-op breast reduction instruction sheet provided at your pre-op appointment.  Diet: Regular. Drink plenty of fluids (water, avoid juice/soda) and eat healthy, high protein, low carbs.  Wound Care: Keep dressing clean & dry. You may change bandages after showering if you continue to notice some drainage.  Special Instructions: Call Doctor if any unusual problems occur such as pain, excessive Bleeding, unrelieved Nausea/vomiting, Fever &/or chills  Drains: Measure drain output from drains every 24 hours. Record this on a log for [place] to view during post-op appointments. Drainage will change colors over the next week to two weeks. This is normal. Color of drainage can vary from red-pink-orange-yellow.  Follow-up appointment: Scheduled for next week.

## 2020-09-19 NOTE — Interval H&P Note (Signed)
Patient seen and examined. Risks and benefits discussed. Proceed with surgery.

## 2020-09-19 NOTE — Anesthesia Postprocedure Evaluation (Signed)
Anesthesia Post Note  Patient: [name]  Procedure(s) Performed: Infraumbilical panniculectomy (Abdomen)     Patient location during evaluation: PACU Anesthesia Type: General Level of consciousness: awake and alert Pain management: pain level controlled Vital Signs Assessment: post-procedure vital signs reviewed and stable Respiratory status: spontaneous breathing, nonlabored ventilation, respiratory function stable and patient connected to face mask oxygen Cardiovascular status: blood pressure returned to baseline and stable Postop Assessment: no apparent nausea or vomiting Anesthetic complications: no   No notable events documented.  Last Vitals:  Vitals:   09/19/20 1620 09/19/20 1635  BP: 127/77 111/81  Pulse: 63 68  Resp: 16 16  Temp:  36.6 C  SpO2: 99% 95%    Last Pain:  Vitals:   09/19/20 1635  TempSrc:   PainSc: 0-No pain                 Kwanza Cancelliere,W. EDMOND

## 2020-09-19 NOTE — Brief Op Note (Signed)
09/19/2020  3:50 PM  PATIENT:  [name]  65 y.o. female  PRE-OPERATIVE DIAGNOSIS:  panniculitis  POST-OPERATIVE DIAGNOSIS:  panniculitis  PROCEDURE:  Procedure(s): Infraumbilical panniculectomy (N/A)  SURGEON:  Surgeon(s) and Role:    * Aiden Rao, Wendy Poet, MD - Primary  PHYSICIAN ASSISTANT: Matt Scheeler and Deforest Hoyles, PA  ASSISTANTS: none   ANESTHESIA:   general  EBL:  50 mL   BLOOD ADMINISTERED:none  DRAINS: (2) Jackson-Pratt drain(s) with closed bulb suction in the abdomen    LOCAL MEDICATIONS USED:  MARCAINE     SPECIMEN:  Source of Specimen:  pannus  DISPOSITION OF SPECIMEN:  PATHOLOGY  COUNTS:  YES  TOURNIQUET:  * No tourniquets in log *  DICTATION: .Dragon Dictation  PLAN OF CARE: Discharge to home after PACU  PATIENT DISPOSITION:  PACU - hemodynamically stable.   Delay start of Pharmacological VTE agent (>24hrs) due to surgical blood loss or risk of bleeding: not applicable

## 2020-09-19 NOTE — Op Note (Signed)
Operative Note   DATE OF OPERATION: 09/19/2020  SURGICAL DEPARTMENT: Plastic Surgery  PREOPERATIVE DIAGNOSES:  Panniculitis  POSTOPERATIVE DIAGNOSES:  same  PROCEDURE:  Infraumbilical Panniculectomy  SURGEON: Ancil Linsey, MD  ASSISTANT: Zadie Cleverly, PA and Evelena Leyden, PA  The advanced practice practitioner (APP) assisted throughout the case.  The APP was essential in retraction and counter traction when needed to make the case progress smoothly.  This retraction and assistance made it possible to see the tissue plans for the procedure.  The assistance was needed for blood control, tissue re-approximation and assisted with closure of the incision site.  ANESTHESIA:  General.   COMPLICATIONS: None.   INDICATIONS FOR PROCEDURE:  The patient, [name] is a 65 y.o. female born on 1955/10/29, is here for treatment of panniculitis MRN: [id number]  CONSENT:  Informed consent was obtained directly from the patient. Risks, benefits and alternatives were fully discussed. Specific risks including but not limited to bleeding, infection, hematoma, seroma, scarring, pain, contracture, asymmetry, wound healing problems, and need for further surgery were all discussed. The patient did have an ample opportunity to have questions answered to satisfaction.   DESCRIPTION OF PROCEDURE:  The patient was taken to the operating room. SCDs were placed and antibiotics were given. General anesthesia was administered.  The patient's operative site was prepped and draped in a sterile fashion. A time out was performed and all information was confirmed to be correct.  I started by marking the midline from the vulvar commissure to the umbilicus.  I then planned out the inferior incision just below the infra pannus crease.  I then estimated the superior incision and the amount of excision.  The area was then infiltrated with tumescent solution.  I started by making the inferior incision with a 10 blade.   I dissected down to the fascia with cautery.  I then undermined superiorly to approximately the level of the umbilicus.  I used tailor tacking to estimate the accuracy of the superior incision.  Superior incision was then made with a 10 blade and the specimen was removed on the left and then the right side.  Total weight of the specimen was 3436g.  Hemostasis was meticulously obtained.  2 15 [place] JP drains were placed and secured with a nylon suture.  Closure was done with buried 2-0 Vicryl sutures for Scarpa's layer.  The skin was closed with a combination of in sorb staples and a running 3-0 V lock suture.  Steri-Strips and a soft dressing were then applied as well as an abdominal binder.  The patient tolerated the procedure well.  There were no complications. The patient was allowed to wake from anesthesia, extubated and taken to the recovery room in satisfactory condition.

## 2020-09-19 NOTE — Anesthesia Procedure Notes (Signed)
Procedure Name: Intubation Date/Time: 09/19/2020 2:23 PM Performed by: Modena Morrow, CRNA Pre-anesthesia Checklist: Patient identified, Emergency Drugs available, Suction available and Patient being monitored Patient Re-evaluated:Patient Re-evaluated prior to induction Oxygen Delivery Method: Circle system utilized Preoxygenation: Pre-oxygenation with 100% oxygen Induction Type: IV induction Ventilation: Mask ventilation without difficulty Laryngoscope Size: Miller and 2 Grade View: Grade I Tube type: Oral Tube size: 7.0 mm Number of attempts: 1 Airway Equipment and Method: Stylet and Oral airway Placement Confirmation: ETT inserted through vocal cords under direct vision, positive ETCO2 and breath sounds checked- equal and bilateral Secured at: 22 cm Tube secured with: Tape Dental Injury: Teeth and Oropharynx as per pre-operative assessment

## 2020-09-20 ENCOUNTER — Encounter (HOSPITAL_COMMUNITY): Payer: Self-pay | Admitting: Plastic Surgery

## 2020-09-20 DIAGNOSIS — G4733 Obstructive sleep apnea (adult) (pediatric): Secondary | ICD-10-CM | POA: Diagnosis not present

## 2020-09-21 LAB — SURGICAL PATHOLOGY

## 2020-09-27 ENCOUNTER — Other Ambulatory Visit: Payer: Self-pay | Admitting: Cardiovascular Disease

## 2020-09-29 ENCOUNTER — Other Ambulatory Visit: Payer: Self-pay

## 2020-09-29 ENCOUNTER — Encounter: Payer: Self-pay | Admitting: Plastic Surgery

## 2020-09-29 ENCOUNTER — Ambulatory Visit (INDEPENDENT_AMBULATORY_CARE_PROVIDER_SITE_OTHER): Payer: Medicare HMO | Admitting: Plastic Surgery

## 2020-09-29 DIAGNOSIS — M793 Panniculitis, unspecified: Secondary | ICD-10-CM

## 2020-09-29 NOTE — Progress Notes (Signed)
Patient resents when we postop from infraumbilical panniculectomy. Overall she feels good and has minimal pain. Drain output has not been high with about 35 to 40 cc per day. She does say however that she hasn't been keeping the bulbs charged. She has noted a decent amount of drainage coming out around the left train. Being that it wasn't having a high output and was t draining around the train I did remove this today and quite a bit of serous drainage followed. I don't detect any subcutaneous fluid elsewhere and her surgery site and her incision looks to be healing nicely. She still has a drain on the right side which I've asked her to keep charged. Will plan to continue compressive garments and avoid strenuous activity I'll see you next week to see about removing the right side of drain

## 2020-10-06 ENCOUNTER — Ambulatory Visit (INDEPENDENT_AMBULATORY_CARE_PROVIDER_SITE_OTHER): Payer: Medicare HMO

## 2020-10-06 ENCOUNTER — Other Ambulatory Visit: Payer: Self-pay

## 2020-10-06 VITALS — BP 132/85 | HR 78 | Temp 97.8°F | Ht 65.0 in | Wt 242.0 lb

## 2020-10-06 DIAGNOSIS — M793 Panniculitis, unspecified: Secondary | ICD-10-CM

## 2020-10-07 NOTE — Progress Notes (Signed)
Patient in office for postop- f/u  S/P panniculectomy by Dr. Arita Miss on 09/19/20 C/O slight pain at the right drain site only. She continues to wear the abdominal binder. Denies any chills/fever & no n/v. She is eating/drinking- & normal bowel/bladder function Right JP drain= 30 - 40 ml /24 hrs. She does still have some swelling noted- but no visible evidence of seroma or fluid shifts upon manipulation. Incision is intact & there is no evidence of complication. I consulted with Dr.Pace about her recovery & her drain amount- he did recommend leaving the drain in until next week. I informed the patient of this plan & she understands & agrees. She is reminded to call for any changes/concerns

## 2020-10-07 NOTE — Patient Instructions (Signed)
Patient will continue compression/abdominal binder Keep record of drainage Call for any concerns
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
## 2020-10-11 ENCOUNTER — Ambulatory Visit (INDEPENDENT_AMBULATORY_CARE_PROVIDER_SITE_OTHER): Payer: Medicare HMO

## 2020-10-11 ENCOUNTER — Other Ambulatory Visit: Payer: Self-pay

## 2020-10-11 ENCOUNTER — Other Ambulatory Visit: Payer: Self-pay | Admitting: Emergency Medicine

## 2020-10-11 VITALS — BP 139/82 | HR 80 | Temp 98.3°F | Ht 65.0 in | Wt 240.0 lb

## 2020-10-11 DIAGNOSIS — M793 Panniculitis, unspecified: Secondary | ICD-10-CM

## 2020-10-11 DIAGNOSIS — J849 Interstitial pulmonary disease, unspecified: Secondary | ICD-10-CM

## 2020-10-12 NOTE — Patient Instructions (Signed)
Patient will call for any changes or concerns 

## 2020-10-12 NOTE — Progress Notes (Signed)
10/11/20 Patient in office today for postop evaluation & possible right drain removal. Patient reports minimal pain, denies any fever/chills & no n/v She has recorded the right JP drain output = 25-35 ml/24 hrs x3 days On exam the incision is intact - with no obvious complications- she does appear to have some swelling in her abdomen- bu no visible fluid shifts or signs of seroma. I removed the right JP drain & instructed her to use vaseline/gauze on this site until healed. She is planning to go out of state later this month & will be gone for 1 month.   I advised that we will see her back in the office before she leaves & we can obtain photos for Dr. Arita Miss for review. She agrees to the above plan.

## 2020-10-13 ENCOUNTER — Encounter: Payer: Self-pay | Admitting: Physician Assistant

## 2020-10-13 ENCOUNTER — Ambulatory Visit (INDEPENDENT_AMBULATORY_CARE_PROVIDER_SITE_OTHER): Payer: Medicare HMO | Admitting: Physician Assistant

## 2020-10-13 ENCOUNTER — Other Ambulatory Visit: Payer: Self-pay

## 2020-10-13 VITALS — BP 110/84 | HR 73 | Ht 65.0 in | Wt 239.4 lb

## 2020-10-13 DIAGNOSIS — E785 Hyperlipidemia, unspecified: Secondary | ICD-10-CM

## 2020-10-13 DIAGNOSIS — R9431 Abnormal electrocardiogram [ECG] [EKG]: Secondary | ICD-10-CM

## 2020-10-13 DIAGNOSIS — I6523 Occlusion and stenosis of bilateral carotid arteries: Secondary | ICD-10-CM

## 2020-10-13 DIAGNOSIS — I10 Essential (primary) hypertension: Secondary | ICD-10-CM

## 2020-10-13 DIAGNOSIS — I2510 Atherosclerotic heart disease of native coronary artery without angina pectoris: Secondary | ICD-10-CM

## 2020-10-13 DIAGNOSIS — I739 Peripheral vascular disease, unspecified: Secondary | ICD-10-CM

## 2020-10-13 MED ORDER — ASPIRIN EC 81 MG PO TBEC: 81.0000 mg | DELAYED_RELEASE_TABLET | Freq: Every day | ORAL | 3 refills | Status: AC

## 2020-10-13 NOTE — Patient Instructions (Addendum)
Medication Instructions:  Your physician has recommended you make the following change in your medication:   REDUCE the Aspirin to 81 mg taking 1 daily   *If you need a refill on your cardiac medications before your next appointment, please call your pharmacy*   Lab Work: None ordered  If you have labs (blood work) drawn today and your tests are completely normal, you will receive your results only by: MyChart Message (if you have MyChart) OR A paper copy in the mail If you have any lab test that is abnormal or we need to change your treatment, we will call you to review the results.   Testing/Procedures: None ordered   Follow-Up: At [facility], you and your health needs are our priority.  As part of our continuing mission to provide you with exceptional heart care, we have created designated Provider Care Teams.  These Care Teams include your primary Cardiologist (physician) and Advanced Practice Providers (APPs -  Physician Assistants and Nurse Practitioners) who all work together to provide you with the care you need, when you need it.  We recommend signing up for the patient portal called "MyChart".  Sign up information is provided on this After Visit Summary.  MyChart is used to connect with patients for Virtual Visits (Telemedicine).  Patients are able to view lab/test results, encounter notes, upcoming appointments, etc.  Non-urgent messages can be sent to your provider as well.   To learn more about what you can do with MyChart, go to [email or web address].    Your next appointment:   6 month(s)  The format for your next appointment:   In Person  Provider:   You may see Ronie Spies, PA-C    Other Instructions

## 2020-10-21 DIAGNOSIS — G4733 Obstructive sleep apnea (adult) (pediatric): Secondary | ICD-10-CM | POA: Diagnosis not present

## 2020-10-26 ENCOUNTER — Other Ambulatory Visit: Payer: Self-pay

## 2020-10-26 ENCOUNTER — Ambulatory Visit: Payer: Medicare HMO

## 2020-10-26 VITALS — BP 142/87 | HR 86 | Temp 98.4°F | Ht 65.0 in | Wt 235.0 lb

## 2020-10-26 DIAGNOSIS — M793 Panniculitis, unspecified: Secondary | ICD-10-CM

## 2020-10-27 NOTE — Patient Instructions (Signed)
Patient will use vaseline/moisturizer on incision & surrounding skin. Call for any concerns F/U - PRN

## 2020-10-27 NOTE — Progress Notes (Unsigned)
10/26/20 Patient in office today for postop f/u S/P panniculectomy by Dr. Arita Miss on 09/19/20 She is pleased with her results -  she did express a desire to have the upper abdomen smaller/tightened- but we discussed the fact that that procedure would be deemed cosmetic & would be out of pocket fees & she understands that & confirmed that  she was informed by Dr. Arita Miss of this. she states she is motivated to exercise/diet to assist in obtaining that goal without surgical intervention. On exam- her incisions have healed completely & the scars are already very minimal. There is no redness/drainage & no sign of compromise. She has little to no pain & she has only c/o "itching" at surgical site & surrounding skin- which is not red/irritated- she does have dry skin & I instructed her to use vaseline/moisturizer to the incision & surrounding skin. She has been eating/drinking  & has normal bowel/bladder function. Photos were taken & entered into her chart. She is leaving next week to visit family out of state & will be away for one month- so I offered to schedule a f/u visit in 6 months- but she insisted that she will call for any concerns or changes- so she can return PRN if needed.

## 2020-11-02 ENCOUNTER — Other Ambulatory Visit: Payer: Medicare HMO

## 2020-12-09 ENCOUNTER — Ambulatory Visit
Admission: RE | Admit: 2020-12-09 | Discharge: 2020-12-09 | Disposition: A | Discharge status: Home / Self Care | Payer: Medicare HMO | Source: Ambulatory Visit | Attending: Emergency Medicine | Admitting: Emergency Medicine

## 2020-12-09 DIAGNOSIS — R918 Other nonspecific abnormal finding of lung field: Secondary | ICD-10-CM | POA: Diagnosis not present

## 2020-12-09 DIAGNOSIS — J849 Interstitial pulmonary disease, unspecified: Secondary | ICD-10-CM

## 2020-12-09 DIAGNOSIS — I700 Atherosclerosis of aorta: Secondary | ICD-10-CM | POA: Diagnosis not present

## 2020-12-09 DIAGNOSIS — R911 Solitary pulmonary nodule: Secondary | ICD-10-CM | POA: Diagnosis not present

## 2021-01-02 ENCOUNTER — Other Ambulatory Visit: Payer: Self-pay | Admitting: Family Medicine

## 2021-01-02 DIAGNOSIS — Z1231 Encounter for screening mammogram for malignant neoplasm of breast: Secondary | ICD-10-CM

## 2021-01-03 ENCOUNTER — Other Ambulatory Visit: Payer: Self-pay | Admitting: Cardiovascular Disease

## 2021-01-03 DIAGNOSIS — E119 Type 2 diabetes mellitus without complications: Secondary | ICD-10-CM | POA: Diagnosis not present

## 2021-01-03 DIAGNOSIS — Z0100 Encounter for examination of eyes and vision without abnormal findings: Secondary | ICD-10-CM | POA: Diagnosis not present

## 2021-01-12 DIAGNOSIS — M65332 Trigger finger, left middle finger: Secondary | ICD-10-CM | POA: Diagnosis not present

## 2021-01-12 DIAGNOSIS — G5602 Carpal tunnel syndrome, left upper limb: Secondary | ICD-10-CM | POA: Diagnosis not present

## 2021-02-02 ENCOUNTER — Other Ambulatory Visit: Payer: Self-pay

## 2021-02-02 ENCOUNTER — Ambulatory Visit (INDEPENDENT_AMBULATORY_CARE_PROVIDER_SITE_OTHER): Payer: Self-pay | Admitting: Plastic Surgery

## 2021-02-02 VITALS — BP 136/66 | HR 75 | Ht 65.0 in | Wt 244.0 lb

## 2021-02-02 DIAGNOSIS — L987 Excessive and redundant skin and subcutaneous tissue: Secondary | ICD-10-CM

## 2021-02-02 DIAGNOSIS — M793 Panniculitis, unspecified: Secondary | ICD-10-CM
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
## 2021-02-03 ENCOUNTER — Ambulatory Visit
Admission: RE | Admit: 2021-02-03 | Discharge: 2021-02-03 | Disposition: A | Discharge status: Home / Self Care | Payer: Medicare HMO | Source: Ambulatory Visit | Attending: Family Medicine | Admitting: Family Medicine

## 2021-02-03 DIAGNOSIS — Z1231 Encounter for screening mammogram for malignant neoplasm of breast: Secondary | ICD-10-CM

## 2021-02-08 DIAGNOSIS — M65332 Trigger finger, left middle finger: Secondary | ICD-10-CM | POA: Diagnosis not present

## 2021-02-08 DIAGNOSIS — G5602 Carpal tunnel syndrome, left upper limb: Secondary | ICD-10-CM | POA: Diagnosis not present

## 2021-02-16 DIAGNOSIS — E2839 Other primary ovarian failure: Secondary | ICD-10-CM | POA: Diagnosis not present

## 2021-02-16 DIAGNOSIS — J019 Acute sinusitis, unspecified: Secondary | ICD-10-CM | POA: Diagnosis not present

## 2021-02-23 DIAGNOSIS — M65332 Trigger finger, left middle finger: Secondary | ICD-10-CM | POA: Diagnosis not present

## 2021-02-28 ENCOUNTER — Other Ambulatory Visit: Payer: Self-pay | Admitting: Family Medicine

## 2021-02-28 DIAGNOSIS — E2839 Other primary ovarian failure: Secondary | ICD-10-CM

## 2021-03-09 ENCOUNTER — Other Ambulatory Visit: Payer: Self-pay | Admitting: Cardiovascular Disease

## 2021-03-09 DIAGNOSIS — Z794 Long term (current) use of insulin: Secondary | ICD-10-CM | POA: Diagnosis not present

## 2021-03-09 DIAGNOSIS — N182 Chronic kidney disease, stage 2 (mild): Secondary | ICD-10-CM | POA: Diagnosis not present

## 2021-03-09 DIAGNOSIS — G4700 Insomnia, unspecified: Secondary | ICD-10-CM | POA: Diagnosis not present

## 2021-03-09 DIAGNOSIS — I10 Essential (primary) hypertension: Secondary | ICD-10-CM | POA: Diagnosis not present

## 2021-03-09 DIAGNOSIS — J019 Acute sinusitis, unspecified: Secondary | ICD-10-CM | POA: Diagnosis not present

## 2021-03-09 DIAGNOSIS — Z79899 Other long term (current) drug therapy: Secondary | ICD-10-CM | POA: Diagnosis not present

## 2021-03-09 DIAGNOSIS — E1169 Type 2 diabetes mellitus with other specified complication: Secondary | ICD-10-CM | POA: Diagnosis not present

## 2021-03-10 NOTE — Telephone Encounter (Signed)
Refill request

## 2021-03-13 ENCOUNTER — Ambulatory Visit
Admission: RE | Admit: 2021-03-13 | Discharge: 2021-03-13 | Disposition: A | Discharge status: Home / Self Care | Payer: Medicare HMO | Source: Ambulatory Visit | Attending: Family Medicine | Admitting: Family Medicine

## 2021-03-13 ENCOUNTER — Telehealth: Payer: Self-pay | Admitting: Emergency Medicine

## 2021-03-13 ENCOUNTER — Other Ambulatory Visit: Payer: Self-pay

## 2021-03-13 DIAGNOSIS — Z780 Asymptomatic menopausal state: Secondary | ICD-10-CM | POA: Diagnosis not present

## 2021-03-13 DIAGNOSIS — E2839 Other primary ovarian failure: Secondary | ICD-10-CM

## 2021-03-13 NOTE — Telephone Encounter (Signed)
Called and spoke with pt letting her know that she was due for an appt and stated to her during that appt the CT results could be discussed. Pt verbalized understanding and have scheduled pt for an appt with RB. Nothing further needed.

## 2021-03-16 ENCOUNTER — Other Ambulatory Visit: Payer: Self-pay

## 2021-03-16 ENCOUNTER — Encounter: Payer: Self-pay | Admitting: Emergency Medicine

## 2021-03-16 ENCOUNTER — Ambulatory Visit (INDEPENDENT_AMBULATORY_CARE_PROVIDER_SITE_OTHER): Payer: Medicare HMO | Admitting: Emergency Medicine

## 2021-03-16 DIAGNOSIS — R918 Other nonspecific abnormal finding of lung field: Secondary | ICD-10-CM | POA: Diagnosis not present

## 2021-03-16 LAB — CBC WITH DIFFERENTIAL/PLATELET
Basophils Absolute: 0 10*3/uL (ref 0.0–0.1)
Basophils Relative: 0.2 % (ref 0.0–3.0)
Eosinophils Absolute: 0.1 10*3/uL (ref 0.0–0.7)
Eosinophils Relative: 2.1 % (ref 0.0–5.0)
HCT: 45.4 % (ref 36.0–46.0)
Hemoglobin: 14.7 g/dL (ref 12.0–15.0)
Lymphocytes Relative: 33 % (ref 12.0–46.0)
Lymphs Abs: 1.6 10*3/uL (ref 0.7–4.0)
MCHC: 32.4 g/dL (ref 30.0–36.0)
MCV: 84.7 fl (ref 78.0–100.0)
Monocytes Absolute: 0.3 10*3/uL (ref 0.1–1.0)
Monocytes Relative: 6.8 % (ref 3.0–12.0)
Neutro Abs: 2.9 10*3/uL (ref 1.4–7.7)
Neutrophils Relative %: 57.9 % (ref 43.0–77.0)
Platelets: 288 10*3/uL (ref 150.0–400.0)
RBC: 5.36 Mil/uL — ABNORMAL HIGH (ref 3.87–5.11)
RDW: 13.9 % (ref 11.5–15.5)
WBC: 4.9 10*3/uL (ref 4.0–10.5)

## 2021-03-16 NOTE — Addendum Note (Signed)
Addended by: Dorisann Frames R on: 03/16/2021 02:54 PM   Modules accepted: Orders

## 2021-03-16 NOTE — Patient Instructions (Signed)
We will perform lab work today. We will arrange for a PET scan/CT scan of the chest. Follow with Dr. Delton Coombes next available after your testing so we can review these results together and plan next steps.  We may decide to arrange for bronchoscopy to further evaluate your chest findings.

## 2021-03-16 NOTE — Assessment & Plan Note (Signed)
More prominent and slightly enlarged compared with her prior.  Etiology unclear, could be consistent with opportunistic infection, could be consistent with systemic inflammatory processes.  Less likely malignancy but also possible.  We will obtain screening labs today: RF, ANA, ANCA, CBC with differential, QuantiFERON gold, ACE level.  I will arrange for a PET/CT chest to further characterize.  Suspect she will need bronchoscopy at least for culture data if not for nodular biopsies.  We will discuss further after her testing.

## 2021-03-16 NOTE — Progress Notes (Signed)
Subjective:    Patient ID: [name], female    DOB: 1955-09-04, 66 y.o.   MRN: [id number]  HPI 66 year old former smoker (15 pack years) with a history of CAD, carotid disease, diabetes, hypertension, hyperlipidemia, hypothyroidism, GERD, morning headaches.  She carries a history of asthma that was made years ago, but she isn't sure whether this was a solid diagnosis.   She is referred today for dyspnea and also a history of pulmonary nodular disease on CT scan of the chest. She does not have exertional SOB, wheeze. She has had PNA and was admitted to [facility] for PNA, COVID negative. She had a CT-PA during that admit, showed basilar GGI and scattered nodules.   CT scan of the chest from 04/24/2019 reviewed by me, shows multiple small pulmonary nodules bilaterally, the largest 1.2 cm in the right upper lobe, some patchy ill-defined areas of groundglass attenuation and septal thickening as well with some associated cylindrical bronchiectatic change and bronchiolectasis most evident in the right upper lobe. Some are stable going back to 2015.   ROV 11/04/19 --66 year old woman, former smoker who follows up today for shortness of breath and pulmonary nodular disease by CT scan of the chest.   We repeated her CT scan of the chest 10/26/2019 and I have reviewed.  This shows no significant change in multiple bilateral pulmonary nodules, largest is 1.0 x 0.6 cm in the peripheral right upper lobe.  There is some bandlike bibasilar scar with some associated irregular groundglass opacity, right upper lobe peripheral emphysematous change surrounded by some mild groundglass.  All unchanged.   Pulmonary function testing done today, reviewed by me, shows normal spirometry without a bronchodilator response, restricted lung volumes and a normal diffusion capacity.  There is some slight curve to her flow volume loop that could suggest mild obstruction. She was titrated to CPAP 14 cmH2O by Dr. Frances Furbish. She is having  trouble tolerating her mask - has had itching, no rash Today she reports that she feels well. No SOB, no cough or sputum.    ROV 03/16/21 --follow-up visit for 66 year old woman with a history of probable mild obstructive lung disease, obstructive sleep apnea, pulmonary nodular disease with some associated cylindrical bronchiectatic change that has been followed on serial imaging.  We repeated her CT scan of the chest 12/09/2020 as below. She tells me today that she has been having some increased cough, yellow green mucous. She has exertional SOB with walking. Does not use any BD's.  Denies any rash, visual changes.  She has never been diagnosed with autoimmune disease, sarcoid, etc.  CT chest 12/09/2020 reviewed by me, shows some borderline enlargement of mediastinal nodes, subpleural reticulation and some scattered areas of parenchymal groundglass similar to prior.  There are associated multiple bilateral pulmonary nodules, some of which may be slightly larger including an 8 mm right perifissural nodule, 3 mm anterior right lung nodule, 9 mm left lower lobe nodule, 14 mm right upper lobe nodule, 5 mm left upper lobe nodule.   Review of Systems As per HPI     Objective:   Physical Exam Vitals:   03/16/21 1425  BP: 124/76  Pulse: 80  Temp: 98.7 F (37.1 C)  SpO2: 97%    Gen: Pleasant, obese woman, in no distress,  normal affect  ENT: No lesions,  mouth clear,  oropharynx clear, no postnasal drip  Neck: No JVD, no stridor  Lungs: No use of accessory muscles, no crackles or wheezing on normal respiration, no  wheeze on forced expiration  Cardiovascular: RRR, heart sounds normal, no murmur or gallops, no peripheral edema  Musculoskeletal: No deformities, no cyanosis or clubbing  Neuro: alert, awake, non focal  Skin: Warm, no lesions or rash      Assessment & Plan:  Pulmonary nodules/lesions, multiple More prominent and slightly enlarged compared with her prior.  Etiology unclear,  could be consistent with opportunistic infection, could be consistent with systemic inflammatory processes.  Less likely malignancy but also possible.  We will obtain screening labs today: RF, ANA, ANCA, CBC with differential, QuantiFERON gold, ACE level.  I will arrange for a PET/CT chest to further characterize.  Suspect she will need bronchoscopy at least for culture data if not for nodular biopsies.  We will discuss further after her testing.  Time spent 40 minutes  Levy Pupa, MD, PhD 03/16/2021, 2:44 PM [facility] [phone] or if no answer [phone]

## 2021-03-21 LAB — QUANTIFERON-TB GOLD PLUS
Mitogen-NIL: 3.76 IU/mL
NIL: 0.06 IU/mL
QuantiFERON-TB Gold Plus: NEGATIVE
TB1-NIL: 0.04 IU/mL
TB2-NIL: 0.05 IU/mL

## 2021-03-21 LAB — ANGIOTENSIN CONVERTING ENZYME: Angiotensin-Converting Enzyme: 34 U/L (ref 9–67)

## 2021-03-21 LAB — ANTI-NUCLEAR AB-TITER (ANA TITER)
ANA TITER: 1:80 {titer} — ABNORMAL HIGH
ANA Titer 1: 1:320 {titer} — ABNORMAL HIGH

## 2021-03-21 LAB — RHEUMATOID FACTOR: Rheumatoid fact SerPl-aCnc: <14 IU/mL (ref <14)

## 2021-03-21 LAB — ANA: Anti Nuclear Antibody (ANA): POSITIVE — AB

## 2021-03-21 LAB — ANCA SCREEN W REFLEX TITER

## 2021-03-22 DIAGNOSIS — R0602 Shortness of breath: Secondary | ICD-10-CM | POA: Diagnosis not present

## 2021-03-22 DIAGNOSIS — I10 Essential (primary) hypertension: Secondary | ICD-10-CM | POA: Diagnosis not present

## 2021-03-22 DIAGNOSIS — Z888 Allergy status to other drugs, medicaments and biological substances status: Secondary | ICD-10-CM | POA: Diagnosis not present

## 2021-03-22 DIAGNOSIS — Z87891 Personal history of nicotine dependence: Secondary | ICD-10-CM | POA: Diagnosis not present

## 2021-03-22 DIAGNOSIS — J168 Pneumonia due to other specified infectious organisms: Secondary | ICD-10-CM | POA: Diagnosis not present

## 2021-03-22 DIAGNOSIS — Z79899 Other long term (current) drug therapy: Secondary | ICD-10-CM | POA: Diagnosis not present

## 2021-03-22 DIAGNOSIS — R0989 Other specified symptoms and signs involving the circulatory and respiratory systems: Secondary | ICD-10-CM | POA: Diagnosis not present

## 2021-03-22 DIAGNOSIS — I517 Cardiomegaly: Secondary | ICD-10-CM | POA: Diagnosis not present

## 2021-03-22 DIAGNOSIS — I2510 Atherosclerotic heart disease of native coronary artery without angina pectoris: Secondary | ICD-10-CM | POA: Diagnosis not present

## 2021-03-22 DIAGNOSIS — Z20822 Contact with and (suspected) exposure to covid-19: Secondary | ICD-10-CM | POA: Diagnosis not present

## 2021-03-22 DIAGNOSIS — E119 Type 2 diabetes mellitus without complications: Secondary | ICD-10-CM | POA: Diagnosis not present

## 2021-03-22 DIAGNOSIS — J8410 Pulmonary fibrosis, unspecified: Secondary | ICD-10-CM | POA: Diagnosis not present

## 2021-03-22 DIAGNOSIS — R918 Other nonspecific abnormal finding of lung field: Secondary | ICD-10-CM | POA: Diagnosis not present

## 2021-03-22 DIAGNOSIS — Z880 Allergy status to penicillin: Secondary | ICD-10-CM | POA: Diagnosis not present

## 2021-03-22 DIAGNOSIS — R059 Cough, unspecified: Secondary | ICD-10-CM | POA: Diagnosis not present

## 2021-03-22 DIAGNOSIS — R911 Solitary pulmonary nodule: Secondary | ICD-10-CM | POA: Diagnosis not present

## 2021-03-31 DIAGNOSIS — I129 Hypertensive chronic kidney disease with stage 1 through stage 4 chronic kidney disease, or unspecified chronic kidney disease: Secondary | ICD-10-CM | POA: Diagnosis not present

## 2021-03-31 DIAGNOSIS — G4700 Insomnia, unspecified: Secondary | ICD-10-CM | POA: Diagnosis not present

## 2021-03-31 DIAGNOSIS — E039 Hypothyroidism, unspecified: Secondary | ICD-10-CM | POA: Diagnosis not present

## 2021-03-31 DIAGNOSIS — E7800 Pure hypercholesterolemia, unspecified: Secondary | ICD-10-CM | POA: Diagnosis not present

## 2021-03-31 DIAGNOSIS — N182 Chronic kidney disease, stage 2 (mild): Secondary | ICD-10-CM | POA: Diagnosis not present

## 2021-03-31 DIAGNOSIS — E1121 Type 2 diabetes mellitus with diabetic nephropathy: Secondary | ICD-10-CM | POA: Diagnosis not present

## 2021-04-04 DIAGNOSIS — R21 Rash and other nonspecific skin eruption: Secondary | ICD-10-CM | POA: Diagnosis not present

## 2021-04-04 DIAGNOSIS — Z6841 Body Mass Index (BMI) 40.0 and over, adult: Secondary | ICD-10-CM | POA: Diagnosis not present

## 2021-04-05 ENCOUNTER — Other Ambulatory Visit: Payer: Self-pay

## 2021-04-05 ENCOUNTER — Ambulatory Visit (HOSPITAL_COMMUNITY)
Admission: RE | Admit: 2021-04-05 | Discharge: 2021-04-05 | Disposition: A | Discharge status: Home / Self Care | Payer: Medicare Other | Source: Ambulatory Visit | Attending: Emergency Medicine | Admitting: Emergency Medicine

## 2021-04-05 DIAGNOSIS — J8410 Pulmonary fibrosis, unspecified: Secondary | ICD-10-CM | POA: Diagnosis not present

## 2021-04-05 DIAGNOSIS — J439 Emphysema, unspecified: Secondary | ICD-10-CM | POA: Diagnosis not present

## 2021-04-05 DIAGNOSIS — R918 Other nonspecific abnormal finding of lung field: Secondary | ICD-10-CM

## 2021-04-05 DIAGNOSIS — I700 Atherosclerosis of aorta: Secondary | ICD-10-CM | POA: Diagnosis not present

## 2021-04-05 LAB — GLUCOSE, CAPILLARY: Glucose-Capillary: 177 mg/dL — ABNORMAL HIGH (ref 70–99)

## 2021-04-05 MED ORDER — FLUDEOXYGLUCOSE F - 18 (FDG) INJECTION
12.1700 | Freq: Once | INTRAVENOUS | Status: AC
  Administered 2021-04-05: 12.17 via INTRAVENOUS

## 2021-04-07 ENCOUNTER — Telehealth: Payer: Self-pay | Admitting: Emergency Medicine

## 2021-04-10 NOTE — Telephone Encounter (Signed)
Please let her know that the nodular areas that we have been following on CT scan are still present, and there is some mild PET activity associated with these areas. This possible causes are as we have discussed > probably inflammatory or infectious.  I suspect that we will decide to investigate further with bronchoscopy.  We should review at OV. ?

## 2021-04-11 NOTE — Telephone Encounter (Signed)
Called and spoke with patient. She verbalized understanding of results. At time of call, she did not have an appt. I attempted to get her scheduled for this Thursday but she did not know if her children could bring her since it was short notice. I was able to get her scheduled for 04/27/21 at 4pm. She will double check with her children to make sure this time will be ok.  ? ?Nothing further needed at time of call.  ?

## 2021-04-12 DIAGNOSIS — K7469 Other cirrhosis of liver: Secondary | ICD-10-CM | POA: Diagnosis not present

## 2021-04-13 ENCOUNTER — Other Ambulatory Visit: Payer: Self-pay | Admitting: Nurse Practitioner

## 2021-04-13 DIAGNOSIS — K7469 Other cirrhosis of liver: Secondary | ICD-10-CM

## 2021-04-20 DIAGNOSIS — R052 Subacute cough: Secondary | ICD-10-CM | POA: Diagnosis not present

## 2021-04-24 ENCOUNTER — Other Ambulatory Visit: Payer: Self-pay

## 2021-04-24 ENCOUNTER — Ambulatory Visit
Admission: RE | Admit: 2021-04-24 | Discharge: 2021-04-24 | Disposition: A | Discharge status: Home / Self Care | Payer: Medicare Other | Source: Ambulatory Visit | Attending: Nurse Practitioner | Admitting: Nurse Practitioner

## 2021-04-24 DIAGNOSIS — K7689 Other specified diseases of liver: Secondary | ICD-10-CM | POA: Diagnosis not present

## 2021-04-24 DIAGNOSIS — K7469 Other cirrhosis of liver: Secondary | ICD-10-CM

## 2021-04-27 ENCOUNTER — Encounter: Payer: Self-pay | Admitting: Emergency Medicine

## 2021-04-27 ENCOUNTER — Ambulatory Visit (INDEPENDENT_AMBULATORY_CARE_PROVIDER_SITE_OTHER): Payer: Medicare Other | Admitting: Emergency Medicine

## 2021-04-27 ENCOUNTER — Other Ambulatory Visit: Payer: Self-pay

## 2021-04-27 VITALS — BP 138/82 | HR 58 | Temp 97.8°F | Ht 65.0 in | Wt 239.8 lb

## 2021-04-27 DIAGNOSIS — J849 Interstitial pulmonary disease, unspecified: Secondary | ICD-10-CM | POA: Diagnosis not present

## 2021-04-27 DIAGNOSIS — R918 Other nonspecific abnormal finding of lung field: Secondary | ICD-10-CM

## 2021-04-27 NOTE — H&P (View-Only) (Signed)
? ?Subjective:  ? ? Patient ID: [name], female    DOB: 31-Oct-1955, 66 y.o.   MRN: [id number] ? ?HPI ?66 year old former smoker (15 pack years) with a history of CAD, carotid disease, diabetes, hypertension, hyperlipidemia, hypothyroidism, GERD, morning headaches.  She carries a history of asthma that was made years ago, but she isn't sure whether this was a solid diagnosis.  ? ?ROV 03/16/21 --follow-up visit for 66 year old woman with a history of probable mild obstructive lung disease, obstructive sleep apnea, pulmonary nodular disease with some associated cylindrical bronchiectatic change that has been followed on serial imaging.  We repeated her CT scan of the chest 12/09/2020 as below. ?She tells me today that she has been having some increased cough, yellow green mucous. She has exertional SOB with walking. Does not use any BD's.  Denies any rash, visual changes.  She has never been diagnosed with autoimmune disease, sarcoid, etc. ? ?CT chest 12/09/2020 reviewed by me, shows some borderline enlargement of mediastinal nodes, subpleural reticulation and some scattered areas of parenchymal groundglass similar to prior.  There are associated multiple bilateral pulmonary nodules, some of which may be slightly larger including an 8 mm right perifissural nodule, 3 mm anterior right lung nodule, 9 mm left lower lobe nodule, 14 mm right upper lobe nodule, 5 mm left upper lobe nodule. ? ? ?ROV 04/27/21 --[name] is 54, has a history of former tobacco use (15 pack years), possible asthma.  I have been following her for this, OSA, pulmonary nodular disease and some associated cylindrical bronchiectatic change.  She deals with chronic cough.  No known history of autoimmune disease.  We checked autoimmune labs last time as below and arrange for PET scan. ? ?Labs 03/16/2021: ANA positive (1: 320), QuantiFERON gold negative, ACE level 34, RF negative, ANCA negative, eosinophils 2.1% (absolute 0.1) ? ?PET scan 04/05/2021  reviewed by me shows enlarged mediastinal nodes, 1.9 x 1.3 pretracheal node.  Unchanged moderate interstitial infiltrates with some areas of groundglass and multiple bilateral pulmonary nodules that have mild hypermetabolism suggestive of an underlying inflammatory process ? ? ?Review of Systems ?As per HPI ? ?   ?Objective:  ? Physical Exam ?Vitals:  ? 04/27/21 1558  ?BP: 138/82  ?Pulse: (!) 58  ?Temp: 97.8 ?F (36.6 ?C)  ?SpO2: 96%  ? ? ?Gen: Pleasant, obese woman, in no distress,  normal affect ? ?ENT: No lesions,  mouth clear,  oropharynx clear, no postnasal drip ? ?Neck: No JVD, no stridor ? ?Lungs: No use of accessory muscles, no crackles or wheezing on normal respiration, no wheeze on forced expiration ? ?Cardiovascular: RRR, heart sounds normal, no murmur or gallops, no peripheral edema ? ?Musculoskeletal: No deformities, no cyanosis or clubbing ? ?Neuro: alert, awake, non focal ? ?Skin: Warm, no lesions or rash ? ? ?   ?Assessment & Plan:  ?ILD (interstitial lung disease) ([place]) ?Etiology unclear.  Mediastinal adenopathy, mixed groundglass with focal interstitial change and scar.  Some scattered discrete nodules as well.  Question sarcoidosis although her ACE level is normal.  Her autoimmune labs were reassuring with the exception of an elevated ANA of unclear significance.  Discussed with her today the rationale for bronchoscopy, BAL and possibly transbronchial biopsies, nodal biopsies to further evaluate.  She understands and agrees.  We will work on setting this up ? ?We reviewed your lab work, CT scan and PET scan today. ?We will plan for bronchoscopy to further evaluate your inflammatory changes on CT scan of the  chest.  This will be done as an outpatient under general anesthesia at [facility] endoscopy.  You will need a designated driver.  You will need to stop your Pletal 5 days prior and your aspirin 2 days prior. ?Follow with Dr Lamonte Sakai in 1 month or next available ? ? ?Baltazar Apo, MD,  PhD ?04/27/2021, 5:12 PM ?[facility] ?[phone] or if no answer [phone] ? ?

## 2021-04-27 NOTE — Patient Instructions (Signed)
We reviewed your lab work, CT scan and PET scan today. ?We will plan for bronchoscopy to further evaluate your inflammatory changes on CT scan of the chest.  This will be done as an outpatient under general anesthesia at [facility] endoscopy.  You will need a designated driver.  You will need to stop your Pletal 5 days prior and your aspirin 2 days prior. ?Follow with Dr Lamonte Sakai in 1 month or next available ?

## 2021-04-27 NOTE — Progress Notes (Signed)
? ?Subjective:  ? ? Patient ID: [name], female    DOB: 1955-12-15, 66 y.o.   MRN: [id number] ? ?HPI ?66 year old former smoker (15 pack years) with a history of CAD, carotid disease, diabetes, hypertension, hyperlipidemia, hypothyroidism, GERD, morning headaches.  She carries a history of asthma that was made years ago, but she isn't sure whether this was a solid diagnosis.  ? ?ROV 03/16/21 --follow-up visit for 66 year old woman with a history of probable mild obstructive lung disease, obstructive sleep apnea, pulmonary nodular disease with some associated cylindrical bronchiectatic change that has been followed on serial imaging.  We repeated her CT scan of the chest 12/09/2020 as below. ?She tells me today that she has been having some increased cough, yellow green mucous. She has exertional SOB with walking. Does not use any BD's.  Denies any rash, visual changes.  She has never been diagnosed with autoimmune disease, sarcoid, etc. ? ?CT chest 12/09/2020 reviewed by me, shows some borderline enlargement of mediastinal nodes, subpleural reticulation and some scattered areas of parenchymal groundglass similar to prior.  There are associated multiple bilateral pulmonary nodules, some of which may be slightly larger including an 8 mm right perifissural nodule, 3 mm anterior right lung nodule, 9 mm left lower lobe nodule, 14 mm right upper lobe nodule, 5 mm left upper lobe nodule. ? ? ?ROV 04/27/21 --[name] is 66, has a history of former tobacco use (15 pack years), possible asthma.  I have been following her for this, OSA, pulmonary nodular disease and some associated cylindrical bronchiectatic change.  She deals with chronic cough.  No known history of autoimmune disease.  We checked autoimmune labs last time as below and arrange for PET scan. ? ?Labs 03/16/2021: ANA positive (1: 320), QuantiFERON gold negative, ACE level 34, RF negative, ANCA negative, eosinophils 2.1% (absolute 0.1) ? ?PET scan 04/05/2021  reviewed by me shows enlarged mediastinal nodes, 1.9 x 1.3 pretracheal node.  Unchanged moderate interstitial infiltrates with some areas of groundglass and multiple bilateral pulmonary nodules that have mild hypermetabolism suggestive of an underlying inflammatory process ? ? ?Review of Systems ?As per HPI ? ?   ?Objective:  ? Physical Exam ?Vitals:  ? 04/27/21 1558  ?BP: 138/82  ?Pulse: (!) 58  ?Temp: 97.8 ?F (36.6 ?C)  ?SpO2: 96%  ? ? ?Gen: Pleasant, obese woman, in no distress,  normal affect ? ?ENT: No lesions,  mouth clear,  oropharynx clear, no postnasal drip ? ?Neck: No JVD, no stridor ? ?Lungs: No use of accessory muscles, no crackles or wheezing on normal respiration, no wheeze on forced expiration ? ?Cardiovascular: RRR, heart sounds normal, no murmur or gallops, no peripheral edema ? ?Musculoskeletal: No deformities, no cyanosis or clubbing ? ?Neuro: alert, awake, non focal ? ?Skin: Warm, no lesions or rash ? ? ?   ?Assessment & Plan:  ?ILD (interstitial lung disease) ([place]) ?Etiology unclear.  Mediastinal adenopathy, mixed groundglass with focal interstitial change and scar.  Some scattered discrete nodules as well.  Question sarcoidosis although her ACE level is normal.  Her autoimmune labs were reassuring with the exception of an elevated ANA of unclear significance.  Discussed with her today the rationale for bronchoscopy, BAL and possibly transbronchial biopsies, nodal biopsies to further evaluate.  She understands and agrees.  We will work on setting this up ? ?We reviewed your lab work, CT scan and PET scan today. ?We will plan for bronchoscopy to further evaluate your inflammatory changes on CT scan of the  chest.  This will be done as an outpatient under general anesthesia at [facility] endoscopy.  You will need a designated driver.  You will need to stop your Pletal 5 days prior and your aspirin 2 days prior. ?Follow with Dr Lamonte Sakai in 1 month or next available ? ? ?Baltazar Apo, MD,  PhD ?04/27/2021, 5:12 PM ?[facility] ?[phone] or if no answer [phone] ? ?

## 2021-04-27 NOTE — Assessment & Plan Note (Signed)
Etiology unclear.  Mediastinal adenopathy, mixed groundglass with focal interstitial change and scar.  Some scattered discrete nodules as well.  Question sarcoidosis although her ACE level is normal.  Her autoimmune labs were reassuring with the exception of an elevated ANA of unclear significance.  Discussed with her today the rationale for bronchoscopy, BAL and possibly transbronchial biopsies, nodal biopsies to further evaluate.  She understands and agrees.  We will work on setting this up ? ?We reviewed your lab work, CT scan and PET scan today. ?We will plan for bronchoscopy to further evaluate your inflammatory changes on CT scan of the chest.  This will be done as an outpatient under general anesthesia at [facility] endoscopy.  You will need a designated driver.  You will need to stop your Pletal 5 days prior and your aspirin 2 days prior. ?Follow with Dr Lamonte Sakai in 1 month or next available ?

## 2021-04-28 DIAGNOSIS — G4733 Obstructive sleep apnea (adult) (pediatric): Secondary | ICD-10-CM | POA: Diagnosis not present

## 2021-05-05 ENCOUNTER — Other Ambulatory Visit: Payer: Self-pay | Admitting: Emergency Medicine

## 2021-05-05 ENCOUNTER — Encounter (HOSPITAL_COMMUNITY): Payer: Self-pay | Admitting: Emergency Medicine

## 2021-05-05 ENCOUNTER — Other Ambulatory Visit: Payer: Self-pay

## 2021-05-05 LAB — SARS CORONAVIRUS 2 (TAT 6-24 HRS): SARS Coronavirus 2: NEGATIVE

## 2021-05-05 NOTE — Anesthesia Preprocedure Evaluation (Addendum)
Anesthesia Evaluation  ?Patient identified by MRN, date of birth, ID band ?Patient awake ? ? ? ?Reviewed: ?Allergy & Precautions, NPO status , Patient's Chart, lab work & pertinent test results ? ?Airway ?Mallampati: II ? ?TM Distance: >3 FB ?Neck ROM: Full ? ? ? Dental ? ?(+) Edentulous Lower, Edentulous Upper ?  ?Pulmonary ?asthma , sleep apnea , former smoker,  ?  ?Pulmonary exam normal ?breath sounds clear to auscultation ? ? ? ? ? ? Cardiovascular ?hypertension, Pt. on medications ?+ CAD and + Peripheral Vascular Disease  ?Normal cardiovascular exam ?Rhythm:Regular Rate:Normal ? ? ?  ?Neuro/Psych ? Headaches, Anxiety TIACVA negative psych ROS  ? GI/Hepatic ?Neg liver ROS, GERD  ,  ?Endo/Other  ?diabetes, Type 2Hypothyroidism Morbid obesity ? Renal/GU ?Renal InsufficiencyRenal disease  ?negative genitourinary ?  ?Musculoskeletal ? ?(+) Arthritis , Osteoarthritis,   ? Abdominal ?(+) + obese,   ?Peds ?negative pediatric ROS ?(+)  Hematology ?negative hematology ROS ?(+)   ?Anesthesia Other Findings ? ? Reproductive/Obstetrics ?negative OB ROS ? ?  ? ? ? ? ? ? ? ? ? ? ? ? ? ?  ?  ? ? ? ? ? ? ?Anesthesia Physical ?Anesthesia Plan ? ?ASA: 3 ? ?Anesthesia Plan: General  ? ?Post-op Pain Management:   ? ?Induction: Intravenous ? ?PONV Risk Score and Plan: 3 and Ondansetron, Dexamethasone, Midazolam and Treatment may vary due to age or medical condition ? ?Airway Management Planned: Oral ETT ? ?Additional Equipment:  ? ?Intra-op Plan:  ? ?Post-operative Plan: Extubation in OR ? ?Informed Consent: I have reviewed the patients History and Physical, chart, labs and discussed the procedure including the risks, benefits and alternatives for the proposed anesthesia with the patient or authorized representative who has indicated his/her understanding and acceptance.  ? ? ? ?Dental advisory given ? ?Plan Discussed with: CRNA ? ?Anesthesia Plan Comments: (PAT note by Karoline Caldwell, PA-C: ?Follows  with cardiology for history of CAD managed medically, carotid artery disease status post left CEA, HTN, HLD, CVA 2014,?prolonged QT interval, PAD with bilateral calf claudication. ?Last seen by Melina Copa, PA-C 10/13/2020.  She was noted to be feeling well from cardiac standpoint and exercising regularly on a bike at home.  Her walking is limited in longer distances due to claudication, which is stable.  She was advised to continue current medications and follow-up in 6 months. ?? ?Patient follows with pulmonologist Dr. Lamonte Sakai for pulmonary nodules.  Last seen 03/16/2021 and reportedly having some increased cough.  Per note, most recent chest CT 16109 22 showed, "borderline enlargement of mediastinal nodes, subpleural reticulation and some scattered areas of parenchymal groundglass similar to prior. ?There are associated multiple bilateral pulmonary nodules, some of which may be slightly larger including an 8 mm right perifissural nodule, 3 mm anterior right lung nodule, 9 mm left lower lobe nodule, 14 mm right upper lobe nodule, 5 mm left upper lobe nodule."  Etiology of nodules was felt to be unclear and bronchoscopy was recommended for further evaluation.  She was instructed to stop Pletal 5 days prior and aspirin 2 days prior. ? ?OSA with difficulty tolerating CPAP. ?? ?Patient is Jehovah's Witness and refuses blood products. ?? ?IDDM2, A1c 7.5 on 09/13/2020. ? ?Patient will need day of surgery labs and evaluation. ?? ?EKG 06/20/20: Sinus rhythm. Rate 71. Consider LAE. ?? ?CT Chest 12/09/20: ?IMPRESSION: ?1. Minimal interval progression of bilateral pulmonary nodules. ?Close follow-up recommended as metastatic disease is not excluded. ?2. Similar appearance of subpleural reticulation with scattered ?areas  of architectural distortion and parenchymal ground-glass ?opacity. Imaging features are compatible with chronic interstitial ?lung disease although sequelae of prior infection/inflammation a ?consideration. ?3. Nodular  hepatic contour suggests cirrhosis. ?4. Aortic Atherosclerosis (ICD10-I70.0). ? ?TTE 06/21/2020: ??1. Left ventricular ejection fraction, by estimation, is 55 to 60%. The  ?left ventricle has normal function. The left ventricle has no regional  ?wall motion abnormalities. There is mild concentric left ventricular  ?hypertrophy. Left ventricular diastolic  ?parameters are consistent with Grade I diastolic dysfunction (impaired  ?relaxation).  ??2. Right ventricular systolic function is normal. The right ventricular  ?size is normal.  ??3. The mitral valve is grossly normal. No evidence of mitral valve  ?regurgitation. No evidence of mitral stenosis.  ??4. The aortic valve is tricuspid. There is mild thickening of the aortic  ?valve. Aortic valve regurgitation is not visualized. No aortic stenosis is  ?present.  ??5. The inferior vena cava is normal in size with greater than 50%  ?respiratory variability, suggesting right atrial pressure of 3 mmHg.  ? ?Comparison(s): A prior study was performed on 01/20/2014. No significant  ?change from prior study. Prior images reviewed side by side.  ?? ?Carotid duplex 06/21/2020: ?Summary:  ?Right Carotid: Velocities in the right ICA are consistent with a 1-39% stenosis.  ? ?Left Carotid: Velocities in the left ICA are consistent with a 1-39% stenosis.  ? ?Vertebrals: Bilateral vertebral arteries demonstrate antegrade flow. ?? ?Nuclear stress 03/27/2017: ?? Nuclear stress EF: 62%. ?? There was no ST segment deviation noted during stress. ?? No T wave inversion was noted during stress. ?? The study is normal. ?? This is a low risk study. ?? The left ventricular ejection fraction is normal (55-65%). ?? ?)  ? ? ? ? ?Anesthesia Quick Evaluation ? ?

## 2021-05-05 NOTE — Progress Notes (Addendum)
Anesthesia Chart Review: ?Same day workup ? ?Follows with cardiology for history of CAD managed medically, carotid artery disease status post left CEA, HTN, HLD, CVA 2014, prolonged QT interval, PAD with bilateral calf claudication.  Last seen by Ronie Spies, PA-C 10/13/2020.  She was noted to be feeling well from cardiac standpoint and exercising regularly on a bike at home.  Her walking is limited in longer distances due to claudication, which is stable.  She was advised to continue current medications and follow-up in 6 months. ?  ?Patient follows with pulmonologist Dr. Delton Coombes for pulmonary nodules.  Last seen 03/16/2021 and reportedly having some increased cough.  Per note, most recent chest CT 62035 22 showed, "borderline enlargement of mediastinal nodes, subpleural reticulation and some scattered areas of parenchymal groundglass similar to prior.  There are associated multiple bilateral pulmonary nodules, some of which may be slightly larger including an 8 mm right perifissural nodule, 3 mm anterior right lung nodule, 9 mm left lower lobe nodule, 14 mm right upper lobe nodule, 5 mm left upper lobe nodule."  Etiology of nodules was felt to be unclear and bronchoscopy was recommended for further evaluation.  She was instructed to stop Pletal 5 days prior and aspirin 2 days prior. ? ?OSA with difficulty tolerating CPAP. ?  ?Patient is Jehovah's Witness and refuses blood products. ?  ?IDDM2, A1c 7.5 on 09/13/2020. ? ?Patient will need day of surgery labs and evaluation. ?  ?EKG 06/20/20: Sinus rhythm. Rate 71. Consider LAE. ?  ?CT Chest 12/09/20: ?IMPRESSION: ?1. Minimal interval progression of bilateral pulmonary nodules. ?Close follow-up recommended as metastatic disease is not excluded. ?2. Similar appearance of subpleural reticulation with scattered ?areas of architectural distortion and parenchymal ground-glass ?opacity. Imaging features are compatible with chronic interstitial ?lung disease although sequelae of prior  infection/inflammation a ?consideration. ?3. Nodular hepatic contour suggests cirrhosis. ?4. Aortic Atherosclerosis (ICD10-I70.0). ? ?TTE 06/21/2020: ? 1. Left ventricular ejection fraction, by estimation, is 55 to 60%. The  ?left ventricle has normal function. The left ventricle has no regional  ?wall motion abnormalities. There is mild concentric left ventricular  ?hypertrophy. Left ventricular diastolic  ?parameters are consistent with Grade I diastolic dysfunction (impaired  ?relaxation).  ? 2. Right ventricular systolic function is normal. The right ventricular  ?size is normal.  ? 3. The mitral valve is grossly normal. No evidence of mitral valve  ?regurgitation. No evidence of mitral stenosis.  ? 4. The aortic valve is tricuspid. There is mild thickening of the aortic  ?valve. Aortic valve regurgitation is not visualized. No aortic stenosis is  ?present.  ? 5. The inferior vena cava is normal in size with greater than 50%  ?respiratory variability, suggesting right atrial pressure of 3 mmHg.  ? ?Comparison(s): A prior study was performed on 01/20/2014. No significant  ?change from prior study. Prior images reviewed side by side.  ?  ?Carotid duplex 06/21/2020: ?Summary:  ?Right Carotid: Velocities in the right ICA are consistent with a 1-39% stenosis.  ? ?Left Carotid: Velocities in the left ICA are consistent with a 1-39% stenosis.  ? ?Vertebrals: Bilateral vertebral arteries demonstrate antegrade flow. ?  ?Nuclear stress 03/27/2017: ?Nuclear stress EF: 62%. ?There was no ST segment deviation noted during stress. ?No T wave inversion was noted during stress. ?The study is normal. ?This is a low risk study. ?The left ventricular ejection fraction is normal (55-65%). ?  ? ? ?Antionette Poles, PA-C ?[facility] Short Stay [facility]/Anesthesiology ?Phone [phone] ?05/05/2021 12:45 PM ? ?

## 2021-05-05 NOTE — Progress Notes (Signed)
PCP - Dr. Paulino Rily, S. ? ?Cardiologist - Dr. Kirke Corin ? ?EP- Denies ? ?Endocrine- Denies ? ?Pulm- Denies ? ?Chest x-ray - 03/22/21 (CE) ? ?EKG - 03/22/21 (CE)- Req'd ? ?Stress Test - 03/27/17 (E) ? ?ECHO - 06/21/20 (E) ? ?Cardiac Cath - Denies ? ?AICD-na ?PM-na ?LOOP-na ? ?Nerve Stimulator- Denies ? ?Dialysis- Denies ? ?Sleep Study - Yes- Positive ?CPAP - Yes ? ?LABS- 05/08/21: CBC, BMP ? ?ASA-LD- 3/31 ?PLETAL-LD- 3/29 ? ?ERAS-No  ? ?HA1C- 09/13/20(E): 7.5 ?Fasting Blood Sugar - 123-170 ?Checks Blood Sugar __3___ times a day ? ?Anesthesia- Yes- Cardiac history, req'd EKG ? ?Pt denies having chest pain, sob, or fever during the pre-op phone call. All instructions explained to the pt, with a verbal understanding of the material including: as of today,  stop taking all Aspirin (unless instructed by your doctor) and Other Aspirin containing products, Vitamins, Fish oils, and Herbal medications. Also stop all NSAIDS i.e. Advil, Ibuprofen, Motrin, Aleve, Anaprox, Naproxen, BC, Goody Powders, and all Supplements.  ? ?WHAT DO I DO ABOUT MY DIABETES MEDICATION? ? ?THE NIGHT BEFORE SURGERY, take _____21______ units of ____70/30_______insulin.     ? ?THE MORNING OF SURGERY, take _______0______ units of _____70/30_____insulin. ? ?The day of surgery, do not take other diabetes injectable Trulicity (dulaglutide). ? ?If your CBG is greater than 220 mg/dL, inform the staff upon arrival to Short Stay. ? ?How do I manage my blood sugar before surgery? ?Check your blood sugar the morning of your surgery when you wake up and every 2 hours until you get to the Short Stay unit. ?If your blood sugar is less than 70 mg/dL, you will need to treat for low blood sugar: ?Do not take insulin. ?Treat a low blood sugar (less than 70 mg/dL) with ? cup of clear juice (cranberry or apple), 4 glucose tablets, OR glucose gel. ?Recheck blood sugar in 15 minutes after treatment (to make sure it is greater than 70 mg/dL). If your blood sugar is not greater than 70  mg/dL on recheck, call [phone] ? for further instructions. ?Report your blood sugar to the short stay nurse when you get to Short Stay. ? ?Reviewed and Endorsed by [facility] Patient Education Committee, August 2015 ? ?Patients discharged the day of surgery will not be allowed to drive home, and someone age 67 and over needs to stay with them for 24 hours.  ? Pt also instructed to wear a mask and social distance if she goes out after being tested for COVID-19. The opportunity to ask questions was provided.  ?

## 2021-05-08 ENCOUNTER — Ambulatory Visit (HOSPITAL_COMMUNITY)
Admission: RE | Admit: 2021-05-08 | Discharge: 2021-05-08 | Disposition: A | Discharge status: Home / Self Care | Payer: Medicare Other | Source: Ambulatory Visit | Attending: Emergency Medicine | Admitting: Emergency Medicine

## 2021-05-08 ENCOUNTER — Other Ambulatory Visit: Payer: Self-pay

## 2021-05-08 ENCOUNTER — Ambulatory Visit (HOSPITAL_COMMUNITY): Payer: Medicare Other

## 2021-05-08 ENCOUNTER — Ambulatory Visit (HOSPITAL_COMMUNITY): Payer: Medicare Other | Admitting: Physician Assistant

## 2021-05-08 ENCOUNTER — Ambulatory Visit (HOSPITAL_BASED_OUTPATIENT_CLINIC_OR_DEPARTMENT_OTHER): Payer: Medicare Other | Admitting: Physician Assistant

## 2021-05-08 ENCOUNTER — Encounter (HOSPITAL_COMMUNITY): Payer: Self-pay | Admitting: Emergency Medicine

## 2021-05-08 ENCOUNTER — Encounter (HOSPITAL_COMMUNITY)
Admission: RE | Disposition: A | Discharge status: Home / Self Care | Payer: Self-pay | Source: Ambulatory Visit | Attending: Emergency Medicine

## 2021-05-08 DIAGNOSIS — J45909 Unspecified asthma, uncomplicated: Secondary | ICD-10-CM | POA: Diagnosis not present

## 2021-05-08 DIAGNOSIS — I10 Essential (primary) hypertension: Secondary | ICD-10-CM | POA: Diagnosis not present

## 2021-05-08 DIAGNOSIS — I2510 Atherosclerotic heart disease of native coronary artery without angina pectoris: Secondary | ICD-10-CM

## 2021-05-08 DIAGNOSIS — Z87891 Personal history of nicotine dependence: Secondary | ICD-10-CM | POA: Insufficient documentation

## 2021-05-08 DIAGNOSIS — F419 Anxiety disorder, unspecified: Secondary | ICD-10-CM | POA: Diagnosis not present

## 2021-05-08 DIAGNOSIS — R590 Localized enlarged lymph nodes: Secondary | ICD-10-CM

## 2021-05-08 DIAGNOSIS — R846 Abnormal cytological findings in specimens from respiratory organs and thorax: Secondary | ICD-10-CM | POA: Diagnosis not present

## 2021-05-08 DIAGNOSIS — Z6841 Body Mass Index (BMI) 40.0 and over, adult: Secondary | ICD-10-CM | POA: Diagnosis not present

## 2021-05-08 DIAGNOSIS — K219 Gastro-esophageal reflux disease without esophagitis: Secondary | ICD-10-CM | POA: Diagnosis not present

## 2021-05-08 DIAGNOSIS — I517 Cardiomegaly: Secondary | ICD-10-CM | POA: Diagnosis not present

## 2021-05-08 DIAGNOSIS — E1151 Type 2 diabetes mellitus with diabetic peripheral angiopathy without gangrene: Secondary | ICD-10-CM | POA: Diagnosis not present

## 2021-05-08 DIAGNOSIS — R918 Other nonspecific abnormal finding of lung field: Secondary | ICD-10-CM | POA: Diagnosis not present

## 2021-05-08 DIAGNOSIS — J849 Interstitial pulmonary disease, unspecified: Secondary | ICD-10-CM | POA: Diagnosis not present

## 2021-05-08 DIAGNOSIS — J984 Other disorders of lung: Secondary | ICD-10-CM | POA: Diagnosis not present

## 2021-05-08 DIAGNOSIS — E039 Hypothyroidism, unspecified: Secondary | ICD-10-CM | POA: Diagnosis not present

## 2021-05-08 HISTORY — PX: VIDEO BRONCHOSCOPY WITH RADIAL ENDOBRONCHIAL ULTRASOUND: SHX6849

## 2021-05-08 HISTORY — PX: VIDEO BRONCHOSCOPY WITH ENDOBRONCHIAL ULTRASOUND: SHX6177

## 2021-05-08 HISTORY — PX: BRONCHIAL BRUSHINGS: SHX5108

## 2021-05-08 HISTORY — PX: BRONCHIAL WASHINGS: SHX5105

## 2021-05-08 HISTORY — PX: BRONCHIAL NEEDLE ASPIRATION BIOPSY: SHX5106

## 2021-05-08 HISTORY — PX: BRONCHIAL BIOPSY: SHX5109

## 2021-05-08 LAB — GLUCOSE, CAPILLARY
Glucose-Capillary: 181 mg/dL — ABNORMAL HIGH (ref 70–99)
Glucose-Capillary: 130 mg/dL — ABNORMAL HIGH (ref 70–99)

## 2021-05-08 LAB — BASIC METABOLIC PANEL
Anion gap: 7 (ref 5–15)
BUN: 7 mg/dL — ABNORMAL LOW (ref 8–23)
CO2: 22 mmol/L (ref 22–32)
Calcium: 9 mg/dL (ref 8.9–10.3)
Chloride: 106 mmol/L (ref 98–111)
Creatinine, Ser: 0.79 mg/dL (ref 0.44–1.00)
GFR, Estimated: >60 mL/min (ref >60)
Glucose, Bld: 165 mg/dL — ABNORMAL HIGH (ref 70–99)
Potassium: 3.8 mmol/L (ref 3.5–5.1)
Sodium: 135 mmol/L (ref 135–145)

## 2021-05-08 LAB — CBC
HCT: 46.3 % — ABNORMAL HIGH (ref 36.0–46.0)
Hemoglobin: 14.5 g/dL (ref 12.0–15.0)
MCH: 26.8 pg (ref 26.0–34.0)
MCHC: 31.3 g/dL (ref 30.0–36.0)
MCV: 85.4 fL (ref 80.0–100.0)
Platelets: 242 10*3/uL (ref 150–400)
RBC: 5.42 MIL/uL — ABNORMAL HIGH (ref 3.87–5.11)
RDW: 13.9 % (ref 11.5–15.5)
WBC: 4.3 10*3/uL (ref 4.0–10.5)
nRBC: 0 % (ref 0.0–0.2)

## 2021-05-08 LAB — NO BLOOD PRODUCTS

## 2021-05-08 SURGERY — BRONCHOSCOPY, WITH BIOPSY USING ELECTROMAGNETIC NAVIGATION
Anesthesia: General | Laterality: Bilateral

## 2021-05-08 MED ORDER — OXYCODONE HCL 5 MG/5ML PO SOLN: 5.0000 mg | Freq: Once | ORAL | Status: DC | PRN

## 2021-05-08 MED ORDER — HYDROMORPHONE HCL 1 MG/ML IJ SOLN: 0.2500 mg | INTRAMUSCULAR | Status: DC | PRN

## 2021-05-08 MED ORDER — CILOSTAZOL 100 MG PO TABS
100.0000 mg | ORAL_TABLET | Freq: Two times a day (BID) | ORAL | 2 refills | Status: DC
Start: 2021-05-08 — End: 2021-12-12

## 2021-05-08 MED ORDER — SPIRONOLACTONE 25 MG PO TABS: 25.0000 mg | ORAL_TABLET | Freq: Every day | ORAL | Status: DC

## 2021-05-08 MED ORDER — ONDANSETRON HCL 4 MG/2ML IJ SOLN
INTRAMUSCULAR | Status: DC | PRN
  Administered 2021-05-08: 4 mg via INTRAVENOUS

## 2021-05-08 MED ORDER — SUGAMMADEX SODIUM 200 MG/2ML IV SOLN
INTRAVENOUS | Status: DC | PRN
  Administered 2021-05-08: 400 mg via INTRAVENOUS

## 2021-05-08 MED ORDER — AMISULPRIDE (ANTIEMETIC) 5 MG/2ML IV SOLN: 10.0000 mg | Freq: Once | INTRAVENOUS | Status: DC | PRN

## 2021-05-08 MED ORDER — FENTANYL CITRATE (PF) 100 MCG/2ML IJ SOLN
INTRAMUSCULAR | Status: DC | PRN
  Administered 2021-05-08: 100 ug via INTRAVENOUS

## 2021-05-08 MED ORDER — EPHEDRINE SULFATE-NACL 50-0.9 MG/10ML-% IV SOSY
PREFILLED_SYRINGE | INTRAVENOUS | Status: DC | PRN
  Administered 2021-05-08: 5 mg via INTRAVENOUS

## 2021-05-08 MED ORDER — INSULIN ASPART 100 UNIT/ML IJ SOLN
INTRAMUSCULAR | Status: AC
  Administered 2021-05-08: 4 [IU] via SUBCUTANEOUS
  Filled 2021-05-08: qty 1

## 2021-05-08 MED ORDER — PROMETHAZINE HCL 25 MG/ML IJ SOLN: 6.2500 mg | INTRAMUSCULAR | Status: DC | PRN

## 2021-05-08 MED ORDER — LACTATED RINGERS IV SOLN: INTRAVENOUS | Status: DC

## 2021-05-08 MED ORDER — LIDOCAINE 2% (20 MG/ML) 5 ML SYRINGE
INTRAMUSCULAR | Status: DC | PRN
  Administered 2021-05-08: 100 mg via INTRAVENOUS

## 2021-05-08 MED ORDER — DEXAMETHASONE SODIUM PHOSPHATE 10 MG/ML IJ SOLN
INTRAMUSCULAR | Status: DC | PRN
  Administered 2021-05-08: 10 mg via INTRAVENOUS

## 2021-05-08 MED ORDER — CHLORHEXIDINE GLUCONATE 0.12 % MT SOLN
OROMUCOSAL | Status: AC
  Filled 2021-05-08: qty 15

## 2021-05-08 MED ORDER — ROCURONIUM BROMIDE 10 MG/ML (PF) SYRINGE
PREFILLED_SYRINGE | INTRAVENOUS | Status: DC | PRN
  Administered 2021-05-08: 100 mg via INTRAVENOUS

## 2021-05-08 MED ORDER — PANTOPRAZOLE SODIUM 40 MG PO TBEC: 40.0000 mg | DELAYED_RELEASE_TABLET | Freq: Every day | ORAL | Status: DC | PRN

## 2021-05-08 MED ORDER — OXYCODONE HCL 5 MG PO TABS: 5.0000 mg | ORAL_TABLET | Freq: Once | ORAL | Status: DC | PRN

## 2021-05-08 MED ORDER — PHENYLEPHRINE 40 MCG/ML (10ML) SYRINGE FOR IV PUSH (FOR BLOOD PRESSURE SUPPORT)
PREFILLED_SYRINGE | INTRAVENOUS | Status: DC | PRN
  Administered 2021-05-08 (×2): 80 ug via INTRAVENOUS

## 2021-05-08 MED ORDER — INSULIN ASPART 100 UNIT/ML IJ SOLN: 0.0000 [IU] | INTRAMUSCULAR | Status: DC | PRN

## 2021-05-08 MED ORDER — MIDAZOLAM HCL 2 MG/2ML IJ SOLN
INTRAMUSCULAR | Status: DC | PRN
  Administered 2021-05-08: 2 mg via INTRAVENOUS

## 2021-05-08 MED ORDER — PROPOFOL 10 MG/ML IV BOLUS
INTRAVENOUS | Status: DC | PRN
  Administered 2021-05-08: 150 mg via INTRAVENOUS

## 2021-05-08 NOTE — Interval H&P Note (Signed)
History and Physical Interval Note: ? ?05/08/2021 ?9:25 AM ? ?[name]  has presented today for surgery, with the diagnosis of BILATERAL INFILTRATE AND PULMONARY NODULES.  The various methods of treatment have been discussed with the patient and family. After consideration of risks, benefits and other options for treatment, the patient has consented to  Procedure(s): ?ROBOTIC ASSISTED NAVIGATIONAL BRONCHOSCOPY (Bilateral) ?VIDEO BRONCHOSCOPY WITH ENDOBRONCHIAL ULTRASOUND (Bilateral) as a surgical intervention.  The patient's history has been reviewed, patient examined, no change in status, stable for surgery.  I have reviewed the patient's chart and labs.  Questions were answered to the patient's satisfaction.   ? ? ?Les Pou Aleaha Fickling ? ? ?

## 2021-05-08 NOTE — Op Note (Signed)
Video Bronchoscopy with Robotic Assisted Bronchoscopic Navigation and Endobronchial Ultrasound Procedure Note ? ?Date of Operation: 05/08/2021  ? ?Pre-op Diagnosis: Bilateral pulmonary nodules, mediastinal adenopathy ? ?Post-op Diagnosis: Same ? ?Surgeon: Baltazar Apo ? ?Assistants: None ? ?Anesthesia: General endotracheal anesthesia ? ?Operation: Flexible video fiberoptic bronchoscopy with robotic assistance and biopsies. ? ?Estimated Blood Loss: Minimal ? ?Complications: None ? ?Indications and History: ?[name] is a 66 y.o. female with history of remote tobacco use, asthma.  We have been following waxing and waning pulmonary nodular disease and mediastinal adenopathy.  Recommendation made to achieve a tissue diagnosis and culture data via navigational bronchoscopy and endobronchial ultrasound. The risks, benefits, complications, treatment options and expected outcomes were discussed with the patient.  The possibilities of pneumothorax, pneumonia, reaction to medication, pulmonary aspiration, perforation of a viscus, bleeding, failure to diagnose a condition and creating a complication requiring transfusion or operation were discussed with the patient who freely signed the consent.   ? ?Description of Procedure: ?The patient was seen in the Preoperative Area, was examined and was deemed appropriate to proceed.  The patient was taken to [facility] endoscopy room 3 from, identified as [name] and the procedure verified as Flexible Video Fiberoptic Bronchoscopy.  A Time Out was held and the above information confirmed.  ? ?Prior to the date of the procedure a high-resolution CT scan of the chest was performed. Utilizing ION software program a virtual tracheobronchial tree was generated to allow the creation of distinct navigation pathways to the patient's parenchymal abnormalities. After being taken to the operating room general anesthesia was initiated and the patient  was orally intubated. The video fiberoptic  bronchoscope was introduced via the endotracheal tube and a general inspection was performed which showed normal right and left lung anatomy with the exception of some edema, thickening of the main carina, right upper lobe carina. Aspiration of the bilateral mainstem bronchi was completed to remove any remaining secretions. Robotic catheter inserted into patient's endotracheal tube.  ? ?Target #1 peripheral right upper lobe pulmonary nodule: ?The distinct navigation pathways prepared prior to this procedure were then utilized to navigate to patient's lesion identified on CT scan. The robotic catheter was secured into place and the vision probe was withdrawn.  Lesion location was approximated using fluoroscopy and radial endobronchial ultrasound for peripheral targeting.  Local registration and targeting was achieved using Cios three-dimensional imaging.  Under fluoroscopic guidance transbronchial brushings and transbronchial forceps biopsies were performed to be sent for cytology and pathology. A bronchioalveolar lavage was performed in the right upper lobe and sent for microbiology and cytology. ? ?Target #2 left lower lobe superior segment pulmonary nodule: ?The distinct navigation pathways prepared prior to this procedure were then utilized to navigate to patient's lesion identified on CT scan. The robotic catheter was secured into place and the vision probe was withdrawn.  Lesion location was approximated using fluoroscopy and radial endobronchial ultrasound for peripheral targeting. Under fluoroscopic guidance transbronchial brushing were performed to be sent for cytology. A bronchioalveolar lavage was performed in the left lower lobe and sent for microbiology and cytology. ? ?The robotic scope was then withdrawn and the endobronchial ultrasound was used to identify and characterize the peritracheal, hilar and bronchial lymph nodes. Inspection showed enlargement at station 4R, station 10 R, station 11 R. Using  real-time ultrasound guidance Wang needle biopsies were take from Station 4R, 10 R, 11 R nodes and were sent for cytology.  ? ?At the end of the procedure a general  airway inspection was performed and there was no evidence of active bleeding. The bronchoscope was removed.  The patient tolerated the procedure well. There was no significant blood loss and there were no obvious complications. A post-procedural chest x-ray is pending. ? ? ?Samples Target #1: ?1. Transbronchial brushings from right upper lobe nodule ?2. Transbronchial forceps biopsies from right upper lobe nodule ?3. Bronchoalveolar lavage from right upper lobe ? ?Samples Target #2: ?1. Transbronchial brushings from left lower lobe superior segment nodule ?2. Bronchoalveolar lavage from left lower lobe superior segment ? ?EBUS samples: ?1. Wang needle biopsies from 4R node ?2. Wang needle biopsies from 10 R node ?3. Wang needle biopsies from 11 R node ? ? ?Plans:  ?The patient will be discharged from the PACU to home when recovered from anesthesia and after chest x-ray is reviewed. We will review the cytology, pathology and microbiology results with the patient when they become available. Outpatient followup will be with Dr. Lamonte Sakai. ? ? ?Baltazar Apo, MD, PhD ?05/08/2021, 12:43 PM ?[facility] ?[phone] or if no answer before 7:00PM call [phone] ?For any issues after 7:00PM please call eLink [phone] ?  ? ? ? ? ? ? ? ? ? ? ?

## 2021-05-08 NOTE — Discharge Instructions (Signed)
Flexible Bronchoscopy, Care After ?This sheet gives you information about how to care for yourself after your test. Your doctor may also give you more specific instructions. If you have problems or questions, contact your doctor. ?Follow these instructions at home: ?Eating and drinking ?Do not eat or drink anything (not even water) for 2 hours after your test, or until your numbing medicine (local anesthetic) wears off. ?When your numbness is gone and your cough and gag reflexes have come back, you may: ?Eat only soft foods. ?Slowly drink liquids. ?The day after the test, go back to your normal diet. ?Driving ?Do not drive for 24 hours if you were given a medicine to help you relax (sedative). ?Do not drive or use heavy machinery while taking prescription pain medicine. ?General instructions ? ?Take over-the-counter and prescription medicines only as told by your doctor. ?Return to your normal activities as told. Ask what activities are safe for you. ?Do not use any products that have nicotine or tobacco in them. This includes cigarettes and e-cigarettes. If you need help quitting, ask your doctor. ?Keep all follow-up visits as told by your doctor. This is important. It is very important if you had a tissue sample (biopsy) taken. ?Get help right away if: ?You have shortness of breath that gets worse. ?You get light-headed. ?You feel like you are going to pass out (faint). ?You have chest pain. ?You cough up: ?More than a little blood. ?More blood than before. ?Summary ?Do not eat or drink anything (not even water) for 2 hours after your test, or until your numbing medicine wears off. ?Do not use cigarettes. Do not use e-cigarettes. ?Get help right away if you have chest pain. ? ?Please call our office for any questions or concerns.  [phone]. ? ?This information is not intended to replace advice given to you by your health care provider. Make sure you discuss any questions you have with your health care  provider. ?Document Released: 11/19/2008 Document Revised: 01/04/2017 Document Reviewed: 02/10/2016 ?Elsevier Patient Education ? [place]. ? ?

## 2021-05-08 NOTE — Transfer of Care (Signed)
Immediate Anesthesia Transfer of Care Note ? ?Patient: [name] ? ?Procedure(s) Performed: ROBOTIC ASSISTED NAVIGATIONAL BRONCHOSCOPY (Bilateral) ?VIDEO BRONCHOSCOPY WITH ENDOBRONCHIAL ULTRASOUND (Bilateral) ?VIDEO BRONCHOSCOPY WITH RADIAL ENDOBRONCHIAL ULTRASOUND ?BRONCHIAL BRUSHINGS ?BRONCHIAL BIOPSIES ?BRONCHIAL WASHINGS ?BRONCHIAL NEEDLE ASPIRATION BIOPSIES ? ?Patient Location: PACU ? ?Anesthesia Type:General ? ?Level of Consciousness: drowsy and patient cooperative ? ?Airway & Oxygen Therapy: Patient Spontanous Breathing and Patient connected to nasal cannula oxygen ? ?Post-op Assessment: Report given to RN and Post -op Vital signs reviewed and stable ? ?Post vital signs: Reviewed and stable ? ?Last Vitals:  ?Vitals Value Taken Time  ?BP 116/59 05/08/21 1245  ?Temp 36.7 ?C 05/08/21 1245  ?Pulse 61 05/08/21 1248  ?Resp 9 05/08/21 1248  ?SpO2 95 % 05/08/21 1248  ?Vitals shown include unvalidated device data. ? ?Last Pain:  ?Vitals:  ? 05/08/21 0900  ?TempSrc: Oral  ?PainSc:   ?   ? ?Patients Stated Pain Goal: 2 (05/05/21 1817) ? ?Complications: No notable events documented. ?

## 2021-05-08 NOTE — Anesthesia Procedure Notes (Signed)
Procedure Name: Intubation ?Date/Time: 05/08/2021 11:08 AM ?Performed by: Moshe Salisbury, CRNA ?Pre-anesthesia Checklist: Patient identified, Emergency Drugs available, Suction available and Patient being monitored ?Patient Re-evaluated:Patient Re-evaluated prior to induction ?Oxygen Delivery Method: Circle System Utilized ?Preoxygenation: Pre-oxygenation with 100% oxygen ?Induction Type: IV induction ?Ventilation: Mask ventilation without difficulty ?Laryngoscope Size: Mac and 3 ?Grade View: Grade I ?Tube type: Oral ?Tube size: 8.5 mm ?Number of attempts: 1 ?Airway Equipment and Method: Stylet ?Placement Confirmation: ETT inserted through vocal cords under direct vision, positive ETCO2 and breath sounds checked- equal and bilateral ?Secured at: 20 cm ?Tube secured with: Tape ?Dental Injury: Teeth and Oropharynx as per pre-operative assessment  ? ? ? ? ?

## 2021-05-09 ENCOUNTER — Encounter (HOSPITAL_COMMUNITY): Payer: Self-pay | Admitting: Emergency Medicine

## 2021-05-09 LAB — CYTOLOGY - NON PAP

## 2021-05-09 NOTE — Anesthesia Postprocedure Evaluation (Signed)
Anesthesia Post Note ? ?Patient: [name] ? ?Procedure(s) Performed: ROBOTIC ASSISTED NAVIGATIONAL BRONCHOSCOPY (Bilateral) ?VIDEO BRONCHOSCOPY WITH ENDOBRONCHIAL ULTRASOUND (Bilateral) ?VIDEO BRONCHOSCOPY WITH RADIAL ENDOBRONCHIAL ULTRASOUND ?BRONCHIAL BRUSHINGS ?BRONCHIAL BIOPSIES ?BRONCHIAL WASHINGS ?BRONCHIAL NEEDLE ASPIRATION BIOPSIES ? ?  ? ?Patient location during evaluation: PACU ?Anesthesia Type: General ?Level of consciousness: awake and alert ?Pain management: pain level controlled ?Vital Signs Assessment: post-procedure vital signs reviewed and stable ?Respiratory status: spontaneous breathing, nonlabored ventilation and respiratory function stable ?Cardiovascular status: blood pressure returned to baseline and stable ?Postop Assessment: no apparent nausea or vomiting ?Anesthetic complications: no ? ? ?No notable events documented. ? ?Last Vitals:  ?Vitals:  ? 05/08/21 1300 05/08/21 1315  ?BP: 106/62 115/61  ?Pulse: 62 64  ?Resp: 12 20  ?Temp:  36.7 ?C  ?SpO2: 94% 92%  ?  ?Last Pain:  ?Vitals:  ? 05/08/21 1315  ?TempSrc:   ?PainSc: 0-No pain  ? ? ?  ?  ?  ?  ?  ?  ? ?Lowella Curb ? ? ? ? ?

## 2021-05-10 LAB — ACID FAST SMEAR (AFB, MYCOBACTERIA)
Acid Fast Smear: NEGATIVE
Acid Fast Smear: NEGATIVE

## 2021-05-11 ENCOUNTER — Telehealth: Payer: Self-pay | Admitting: Emergency Medicine

## 2021-05-11 DIAGNOSIS — E1165 Type 2 diabetes mellitus with hyperglycemia: Secondary | ICD-10-CM | POA: Diagnosis not present

## 2021-05-11 DIAGNOSIS — J849 Interstitial pulmonary disease, unspecified: Secondary | ICD-10-CM | POA: Diagnosis not present

## 2021-05-11 DIAGNOSIS — K7460 Unspecified cirrhosis of liver: Secondary | ICD-10-CM | POA: Diagnosis not present

## 2021-05-11 DIAGNOSIS — K760 Fatty (change of) liver, not elsewhere classified: Secondary | ICD-10-CM | POA: Diagnosis not present

## 2021-05-11 NOTE — Telephone Encounter (Signed)
Called and left a message for [name] to review bronchoscopy results.  No answer. ?All of her transbronchial brushings and biopsies are negative for malignancy.  All of the cultures are still pending.  I will call her back to review further. ?

## 2021-05-13 LAB — AEROBIC/ANAEROBIC CULTURE W GRAM STAIN (SURGICAL/DEEP WOUND)
Culture: NO GROWTH
Culture: NO GROWTH
Gram Stain: NONE SEEN

## 2021-05-15 NOTE — Telephone Encounter (Signed)
Discussed bronchoscopy results with the patient by phone today.  Her transbronchial samples were all negative for malignancy.  We are waiting for culture data.  We have follow-up in early May to review those studies. ?

## 2021-05-24 DIAGNOSIS — I10 Essential (primary) hypertension: Secondary | ICD-10-CM | POA: Diagnosis not present

## 2021-05-24 DIAGNOSIS — G4700 Insomnia, unspecified: Secondary | ICD-10-CM | POA: Diagnosis not present

## 2021-05-24 DIAGNOSIS — N182 Chronic kidney disease, stage 2 (mild): Secondary | ICD-10-CM | POA: Diagnosis not present

## 2021-05-24 DIAGNOSIS — E039 Hypothyroidism, unspecified: Secondary | ICD-10-CM | POA: Diagnosis not present

## 2021-05-24 DIAGNOSIS — E7800 Pure hypercholesterolemia, unspecified: Secondary | ICD-10-CM | POA: Diagnosis not present

## 2021-05-24 DIAGNOSIS — I509 Heart failure, unspecified: Secondary | ICD-10-CM | POA: Diagnosis not present

## 2021-05-24 DIAGNOSIS — E1121 Type 2 diabetes mellitus with diabetic nephropathy: Secondary | ICD-10-CM | POA: Diagnosis not present

## 2021-05-30 ENCOUNTER — Ambulatory Visit (INDEPENDENT_AMBULATORY_CARE_PROVIDER_SITE_OTHER): Payer: Medicare Other | Admitting: Cardiovascular Disease

## 2021-05-30 ENCOUNTER — Encounter: Payer: Self-pay | Admitting: Cardiovascular Disease

## 2021-05-30 VITALS — BP 137/72 | HR 75 | Ht 65.0 in | Wt 242.0 lb

## 2021-05-30 DIAGNOSIS — I739 Peripheral vascular disease, unspecified: Secondary | ICD-10-CM | POA: Diagnosis not present

## 2021-05-30 DIAGNOSIS — I2510 Atherosclerotic heart disease of native coronary artery without angina pectoris: Secondary | ICD-10-CM | POA: Diagnosis not present

## 2021-05-30 DIAGNOSIS — I10 Essential (primary) hypertension: Secondary | ICD-10-CM

## 2021-05-30 DIAGNOSIS — I779 Disorder of arteries and arterioles, unspecified: Secondary | ICD-10-CM | POA: Diagnosis not present

## 2021-05-30 DIAGNOSIS — E785 Hyperlipidemia, unspecified: Secondary | ICD-10-CM | POA: Diagnosis not present
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
## 2021-05-30 NOTE — Patient Instructions (Signed)
Medication Instructions:  ?No Changes In Medications at this time.  ?*If you need a refill on your cardiac medications before your next appointment, please call your pharmacy* ? ?Testing/Procedures: ?PLEASE SCHEDULE VAS [place] ART SEGMENTAL- THIS WILL TAKE PLACE AT [address]- Dr. Jari Sportsman OFFICE  ? ?Follow-Up: ?At [facility], you and your health needs are our priority.  As part of our continuing mission to provide you with exceptional heart care, we have created designated Provider Care Teams.  These Care Teams include your primary Cardiologist (physician) and Advanced Practice Providers (APPs -  Physician Assistants and Nurse Practitioners) who all work together to provide you with the care you need, when you need it. ? ?Your next appointment:   ?6 month(s) ? ?The format for your next appointment:   ?In Person ? ?Provider: ?Dr. Kirke Corin  ? ? ? ?

## 2021-06-07 ENCOUNTER — Ambulatory Visit (INDEPENDENT_AMBULATORY_CARE_PROVIDER_SITE_OTHER): Payer: Medicare Other | Admitting: Emergency Medicine

## 2021-06-07 ENCOUNTER — Encounter: Payer: Self-pay | Admitting: Emergency Medicine

## 2021-06-07 ENCOUNTER — Other Ambulatory Visit: Payer: Self-pay | Admitting: Cardiovascular Disease

## 2021-06-07 VITALS — BP 124/72 | HR 77 | Temp 97.4°F | Ht 65.0 in | Wt 241.8 lb

## 2021-06-07 DIAGNOSIS — R053 Chronic cough: Secondary | ICD-10-CM | POA: Diagnosis not present

## 2021-06-07 DIAGNOSIS — J849 Interstitial pulmonary disease, unspecified: Secondary | ICD-10-CM | POA: Diagnosis not present

## 2021-06-07 LAB — FUNGUS CULTURE WITH STAIN

## 2021-06-07 LAB — FUNGAL ORGANISM REFLEX

## 2021-06-07 LAB — FUNGUS CULTURE RESULT

## 2021-06-07 MED ORDER — PANTOPRAZOLE SODIUM 40 MG PO TBEC: 40.0000 mg | DELAYED_RELEASE_TABLET | Freq: Every day | ORAL | Status: DC

## 2021-06-07 NOTE — Assessment & Plan Note (Signed)
Interstitial changes with scattered groundglass and some peripheral more discrete nodules in the right upper lobe, superior segment of left lower lobe.  Transbronchial biopsies were reassuring.  Cultures are all negative so far although AFB and fungal cultures are still pending.  We will follow these to completion.  Plan to repeat high-resolution CT scan of the chest in September.  Her pulmonary function testing from 2021 were reassuring, consider repeating going forward depending on course. ?

## 2021-06-07 NOTE — Patient Instructions (Addendum)
We reviewed your bronchoscopy results today.  No evidence of cancer on any of your biopsies.  Your cultures are all negative so far.  Your mycobacterial and fungal cultures are not yet final but we will follow these to completion. ?We will plan to repeat your CT scan of the chest in September ?Start taking your pantoprazole (Protonix) 40 mg twice a day for the next 2 weeks.  Take this medication 1 hour around food. ?After 2 weeks she can decrease the pantoprazole to 40 mg once a day at dinnertime. ?Start using loratadine 10 mg (generic Claritin) once daily ?Follow-up with APP in 4 weeks to discuss any improvement in her cough ?Follow Dr. Delton Coombes in September after your CT scan so we can review the results together. ?

## 2021-06-07 NOTE — Telephone Encounter (Signed)
Refill Request.  

## 2021-06-07 NOTE — Progress Notes (Signed)
? ?Subjective:  ? ? Patient ID: [name], female    DOB: [birth year], 66 y.o.   MRN: [id number] ? ?HPI ?66 year old former smoker (15 pack years) with a history of CAD, carotid disease, diabetes, hypertension, hyperlipidemia, hypothyroidism, GERD, morning headaches.  She carries a history of asthma that was made years ago, but she isn't sure whether this was a solid diagnosis.  ? ?ROV 04/27/21 --[name] is 54, has a history of former tobacco use (15 pack years), possible asthma.  I have been following her for this, OSA, pulmonary nodular disease and some associated cylindrical bronchiectatic change.  She deals with chronic cough.  No known history of autoimmune disease.  We checked autoimmune labs last time as below and arrange for PET scan. ? ?Labs 03/16/2021: ANA positive (1: 320), QuantiFERON gold negative, ACE level 34, RF negative, ANCA negative, eosinophils 2.1% (absolute 0.1) ? ?PET scan 04/05/2021 reviewed by me shows enlarged mediastinal nodes, 1.9 x 1.3 pretracheal node.  Unchanged moderate interstitial infiltrates with some areas of groundglass and multiple bilateral pulmonary nodules that have mild hypermetabolism suggestive of an underlying inflammatory process ? ?ROV 06/07/21 --[name] [name] with a history of former tobacco use.  She has obstructive sleep apnea, some interstitial change on chest imaging associated with mediastinal adenopathy and mixed groundglass infiltrate.  She has an elevated ANA of unclear significance.  She underwent navigational bronchoscopy and EBUS on 05/08/2021 to better evaluate her nodular groundglass abnormalities on CT, adenopathy.  Right upper lobe, left lower lobe superior segmental transbronchial samples taken.  Nodal biopsies at 4R, 10 R, 11 R. ?She has a lot of cough, mucous production. Happens day or night, will wake her from sleep.  ? ?All of her transbronchial brushings and biopsies from 4/3 were negative for malignancy.  Fungal smears and AFB smears are  both negative with cultures pending ? ? ? ? ?Review of Systems ?As per HPI ? ?   ?Objective:  ? Physical Exam ?Vitals:  ? 06/07/21 1632  ?BP: 124/72  ?Pulse: 77  ?Temp: (!) 97.4 ?F (36.3 ?C)  ?SpO2: 94%  ? ? ?Gen: [name], [name], in no distress,  normal affect, frequent cough ? ?ENT: No lesions,  mouth clear,  oropharynx clear, no postnasal drip ? ?Neck: No JVD, no stridor ? ?Lungs: No use of accessory muscles, no crackles or wheezing on normal respiration, no wheeze on forced expiration ? ?Cardiovascular: RRR, heart sounds normal, no murmur or gallops, no peripheral edema ? ?Musculoskeletal: No deformities, no cyanosis or clubbing ? ?Neuro: alert, awake, non focal ? ?Skin: Warm, no lesions or rash ? ? ?   ?Assessment & Plan:  ?ILD (interstitial lung disease) (HCC) ?Interstitial changes with scattered groundglass and some peripheral more discrete nodules in the right upper lobe, superior segment of left lower lobe.  Transbronchial biopsies were reassuring.  Cultures are all negative so far although AFB and fungal cultures are still pending.  We will follow these to completion.  Plan to repeat high-resolution CT scan of the chest in September.  Her pulmonary function testing from 2021 were reassuring, consider repeating going forward depending on course. ? ?Chronic cough ?Fairly severe paroxysmal chronic cough, happens during the day and at night.  Can wake her from sleep.  She does have persistent GERD and is only using her pantoprazole as needed.  We will try to treat the GERD more aggressively and see if she gets benefit.  May require cough suppression.  Follow her culture data to  completion, negative so far.  She has been treated with empiric antibiotics several times in the last several months without any improvement ? ? ?Levy Pupa, MD, PhD ?06/07/2021, 4:54 PM ?[facility] ?[phone] or if no answer [phone] ? ?

## 2021-06-07 NOTE — Assessment & Plan Note (Signed)
Fairly severe paroxysmal chronic cough, happens during the day and at night.  Can wake her from sleep.  She does have persistent GERD and is only using her pantoprazole as needed.  We will try to treat the GERD more aggressively and see if she gets benefit.  May require cough suppression.  Follow her culture data to completion, negative so far.  She has been treated with empiric antibiotics several times in the last several months without any improvement ?

## 2021-06-12 ENCOUNTER — Other Ambulatory Visit: Payer: Self-pay | Admitting: Cardiovascular Disease

## 2021-06-19 ENCOUNTER — Other Ambulatory Visit: Payer: Self-pay | Admitting: Cardiovascular Disease

## 2021-06-19 DIAGNOSIS — I739 Peripheral vascular disease, unspecified: Secondary | ICD-10-CM

## 2021-06-21 ENCOUNTER — Ambulatory Visit (HOSPITAL_COMMUNITY)
Admission: RE | Admit: 2021-06-21 | Discharge: 2021-06-21 | Disposition: A | Discharge status: Home / Self Care | Payer: Medicare Other | Source: Ambulatory Visit | Attending: Cardiology | Admitting: Cardiology

## 2021-06-21 DIAGNOSIS — I739 Peripheral vascular disease, unspecified: Secondary | ICD-10-CM | POA: Insufficient documentation

## 2021-06-22 DIAGNOSIS — M25562 Pain in left knee: Secondary | ICD-10-CM | POA: Diagnosis not present

## 2021-06-22 DIAGNOSIS — M25561 Pain in right knee: Secondary | ICD-10-CM | POA: Diagnosis not present

## 2021-06-24 LAB — ACID FAST CULTURE WITH REFLEXED SENSITIVITIES (MYCOBACTERIA)
Acid Fast Culture: NEGATIVE
Acid Fast Culture: NEGATIVE

## 2021-06-26 ENCOUNTER — Telehealth: Payer: Self-pay | Admitting: Emergency Medicine

## 2021-06-26 NOTE — Telephone Encounter (Signed)
Called and spoke with pt. Pt is requesting to know if all of the results from recent bronch have come back. Dr. Delton Coombes, please advise.

## 2021-06-27 NOTE — Telephone Encounter (Signed)
Please let her know that all of her cultures are now back >> all are negative.

## 2021-06-27 NOTE — Telephone Encounter (Signed)
Called patient to go over results of her cultures. And she was wondering why she still had a prolonged cough for three months now. Got back scheduled  06/28/2021 Dr Lamonte Sakai 4pm  Patient verbalized understanding. Nothing further needed

## 2021-06-28 ENCOUNTER — Ambulatory Visit (INDEPENDENT_AMBULATORY_CARE_PROVIDER_SITE_OTHER): Payer: Medicare Other | Admitting: Emergency Medicine

## 2021-06-28 ENCOUNTER — Encounter: Payer: Self-pay | Admitting: Emergency Medicine

## 2021-06-28 DIAGNOSIS — J849 Interstitial pulmonary disease, unspecified: Secondary | ICD-10-CM

## 2021-06-28 DIAGNOSIS — R053 Chronic cough: Secondary | ICD-10-CM

## 2021-06-28 MED ORDER — DOXYCYCLINE HYCLATE 100 MG PO TABS: 100.0000 mg | ORAL_TABLET | Freq: Two times a day (BID) | ORAL | 0 refills | Status: DC

## 2021-06-28 NOTE — Assessment & Plan Note (Signed)
Lung disease with nodular component and lymphadenopathy.  No granulomas but consistent with an autoimmune inflammatory process, question sarcoidosis.  Her ANA is elevated.  Given her negative cultures from her bronchoscopy I would like to treat her with prednisone to see if we can improve her cough, symptoms and interstitial changes on CT.  She is very hesitant to do prednisone because it has caused her to gain weight in the past.  She will is not interested in doing so right now despite the fact that she is quite symptomatic with cough.  I will try treating her for possible bronchitis in case there is an infectious factor here.  Treat her acute on chronic rhinitis by adding loratadine and fluticasone nasal spray.  I think ultimately she will need prednisone.  We could consider a steroid sparing agent but these are fraught with potential side effects, require monitoring, etc.  She may require rheumatology evaluation given the CT changes and her positive ANA.  I can talk to her about this next time.  I will plan to repeat her CT scan of the chest in July and we will decide then.

## 2021-06-28 NOTE — Progress Notes (Signed)
Subjective:    Patient ID: [name], female    DOB: 07-02-1955, 66 y.o.   MRN: [id number]  HPI 66 year old former smoker (15 pack years) with a history of CAD, carotid disease, diabetes, hypertension, hyperlipidemia, hypothyroidism, GERD, morning headaches.  She carries a history of asthma that was made years ago, but she isn't sure whether this was a solid diagnosis.   ROV 04/27/21 --[name] is 66, has a history of former tobacco use (15 pack years), possible asthma.  I have been following her for this, OSA, pulmonary nodular disease and some associated cylindrical bronchiectatic change.  She deals with chronic cough.  No known history of autoimmune disease.  We checked autoimmune labs last time as below and arrange for PET scan.  Labs 03/16/2021: ANA positive (1: 320), QuantiFERON gold negative, ACE level 34, RF negative, ANCA negative, eosinophils 2.1% (absolute 0.1)  PET scan 04/05/2021 reviewed by me shows enlarged mediastinal nodes, 1.9 x 1.3 pretracheal node.  Unchanged moderate interstitial infiltrates with some areas of groundglass and multiple bilateral pulmonary nodules that have mild hypermetabolism suggestive of an underlying inflammatory process   ROV 06/07/21 --pleasant 66 year old woman with a history of former tobacco use.  She has obstructive sleep apnea, some interstitial change on chest imaging associated with mediastinal adenopathy and mixed groundglass infiltrate.  She has an elevated ANA of unclear significance.  She underwent navigational bronchoscopy and EBUS on 05/08/2021 to better evaluate her nodular groundglass abnormalities on CT, adenopathy.  Right upper lobe, left lower lobe superior segmental transbronchial samples taken.  Nodal biopsies at 4R, 10 R, 11 R. She has a lot of cough, mucous production. Happens day or night, will wake her from sleep.   All of her transbronchial brushings and biopsies from 4/3 were negative for malignancy.  Fungal smears and AFB smears  are both negative with cultures pending   ROV 06/28/21 --66 year old woman, former smoker, history of OSA and an abnormal CT scan of the chest characterized by some mediastinal adenopathy, mixed groundglass infiltrate and interstitial change of unclear cause.  She underwent EBUS and navigational bronchoscopy 05/08/21.  Right upper lobe, left lower lobe transbronchial biopsies negative for malignancy.  No malignancy or granulomas seen in her mediastinal nodes.  She follows up today and her cultures are all completed, all negative. Her ANA is positive.      Review of Systems As per HPI     Objective:   Physical Exam Vitals:   06/28/21 1613  BP: 128/66  Pulse: 75  Temp: 98.2 F (36.8 C)  SpO2: 93%    Gen: Pleasant, obese woman, in no distress,  normal affect, frequent cough  ENT: No lesions,  mouth clear,  oropharynx clear, no postnasal drip  Neck: No JVD, no stridor  Lungs: No use of accessory muscles, no crackles or wheezing on normal respiration, no wheeze on forced expiration  Cardiovascular: RRR, heart sounds normal, no murmur or gallops, no peripheral edema  Musculoskeletal: No deformities, no cyanosis or clubbing  Neuro: alert, awake, non focal  Skin: Warm, no lesions or rash      Assessment & Plan:  ILD (interstitial lung disease) (HCC) Lung disease with nodular component and lymphadenopathy.  No granulomas but consistent with an autoimmune inflammatory process, question sarcoidosis.  Her ANA is elevated.  Given her negative cultures from her bronchoscopy I would like to treat her with prednisone to see if we can improve her cough, symptoms and interstitial changes on CT.  She is very  hesitant to do prednisone because it has caused her to gain weight in the past.  She will is not interested in doing so right now despite the fact that she is quite symptomatic with cough.  I will try treating her for possible bronchitis in case there is an infectious factor here.  Treat her  acute on chronic rhinitis by adding loratadine and fluticasone nasal spray.  I think ultimately she will need prednisone.  We could consider a steroid sparing agent but these are fraught with potential side effects, require monitoring, etc.  She may require rheumatology evaluation given the CT changes and her positive ANA.  I can talk to her about this next time.  I will plan to repeat her CT scan of the chest in July and we will decide then.  Chronic cough I suspect that her interstitial lung disease behind this.  She does not want to be treated with prednisone so I am going to empirically treat possible bronchitis and add a regimen for her chronic rhinitis.   Levy Pupa, MD, PhD 06/28/2021, 4:42 PM [facility] [phone] or if no answer [phone]

## 2021-06-28 NOTE — Addendum Note (Signed)
Addended by: Phillips Grout on: 06/28/2021 04:45 PM   Modules accepted: Orders

## 2021-06-28 NOTE — Assessment & Plan Note (Signed)
I suspect that her interstitial lung disease behind this.  She does not want to be treated with prednisone so I am going to empirically treat possible bronchitis and add a regimen for her chronic rhinitis.

## 2021-06-28 NOTE — Patient Instructions (Addendum)
Please take doxycycline as directed until completely gone. Start loratadine 10 mg (generic Claritin) once daily. Start fluticasone nasal spray, 2 sprays each nostril once daily. We will repeat your CT scan of the chest in July 2023. Follow Dr. Lamonte Sakai in July after your CT scan so we can review the results together.

## 2021-07-04 ENCOUNTER — Telehealth: Payer: Self-pay | Admitting: *Deleted

## 2021-07-04 NOTE — Telephone Encounter (Signed)
-----   Message from Iran Ouch, MD sent at 06/21/2021  5:37 PM EDT ----- Moderately reduced ABI with bilateral SFA occlusion.  Recommend a walking exercise program.  Please provide her with PAD exercise instructions.

## 2021-07-04 NOTE — Telephone Encounter (Signed)
Called and spoke with the patient about her results. She stated that she does try to walk daily but it has become more difficult for her. She will walk to the mailbox and won't be able to walk back due to the pain. She denies pain at rest.  She would like to discuss the next step, if possible.

## 2021-07-06 NOTE — Telephone Encounter (Signed)
Appointment made for 6/13 with Dr. Fletcher Anon

## 2021-07-06 NOTE — Telephone Encounter (Signed)
Given that the SFAs are occluded, revascularization options are not straightforward but can be done.  However, I need to have a detailed discussion with her about the risks and expectations for the future.  It is best to schedule a follow-up visit to go over this.

## 2021-07-18 ENCOUNTER — Encounter: Payer: Self-pay | Admitting: Cardiovascular Disease

## 2021-07-18 ENCOUNTER — Ambulatory Visit (INDEPENDENT_AMBULATORY_CARE_PROVIDER_SITE_OTHER): Payer: Medicare Other | Admitting: Cardiovascular Disease

## 2021-07-18 VITALS — BP 130/76 | HR 74 | Ht 65.0 in | Wt 247.2 lb

## 2021-07-18 DIAGNOSIS — I739 Peripheral vascular disease, unspecified: Secondary | ICD-10-CM

## 2021-07-18 DIAGNOSIS — E785 Hyperlipidemia, unspecified: Secondary | ICD-10-CM | POA: Diagnosis not present

## 2021-07-18 DIAGNOSIS — I779 Disorder of arteries and arterioles, unspecified: Secondary | ICD-10-CM | POA: Diagnosis not present

## 2021-07-18 DIAGNOSIS — I10 Essential (primary) hypertension: Secondary | ICD-10-CM | POA: Diagnosis not present

## 2021-07-18 DIAGNOSIS — I2510 Atherosclerotic heart disease of native coronary artery without angina pectoris: Secondary | ICD-10-CM
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
## 2021-07-18 NOTE — Patient Instructions (Signed)
Medication Instructions:  No changes *If you need a refill on your cardiac medications before your next appointment, please call your pharmacy*   Lab Work: None ordered If you have labs (blood work) drawn today and your tests are completely normal, you will receive your results only by: MyChart Message (if you have MyChart) OR A paper copy in the mail If you have any lab test that is abnormal or we need to change your treatment, we will call you to review the results.   Testing/Procedures: None ordered   Follow-Up: At [facility], you and your health needs are our priority.  As part of our continuing mission to provide you with exceptional heart care, we have created designated Provider Care Teams.  These Care Teams include your primary Cardiologist (physician) and Advanced Practice Providers (APPs -  Physician Assistants and Nurse Practitioners) who all work together to provide you with the care you need, when you need it.  We recommend signing up for the patient portal called "MyChart".  Sign up information is provided on this After Visit Summary.  MyChart is used to connect with patients for Virtual Visits (Telemedicine).  Patients are able to view lab/test results, encounter notes, upcoming appointments, etc.  Non-urgent messages can be sent to your provider as well.   To learn more about what you can do with MyChart, go to [email or web address].    Your next appointment:   6 month(s)  The format for your next appointment:   In Person  Provider:   Dr. Arida   Other Instructions EXERCISE PROGRAM FOR INDIVIDUALS WITH  PERIPHERAL ARTERIAL DISEASE (PAD)   General Information:   Research in vascular exercise has demonstrated remarkable improvement in symptoms of leg pain (claudication) without expensive or invasive interventions. Regular walking programs are extremely helpful for patients with PAD and intermittent claudication.  These steps are designed to help you get  started with a safe and effective program to help you walk farther with less pain:   Walk at least three times a week (preferably every day).  Your goal is to build up to 30-45 minutes of total walking time (not counting rest breaks). It may take you several weeks to build up your exercise time starting at 5-10 minutes or whatever you can tolerate.  Walk as far as possible using moderate to maximal pain (7-8 on the scale below) as a signal to stop, and resume walking when the pain goes away.  On a treadmill, set the speed and grade at a level that brings on the claudication pain within 3 to 5 minutes. Walk at this rate until you experience claudication of moderate severity, rest until the pain improves, and then resume walking.  Over time, you will be able to walk longer at the designated speed and grade; workload should then be increased until you develop the pain within 3 to 5 minutes once again.  This regimen will induce a significant benefit. Studies have demonstrated that participants may be able to walk up to three or four times farther and have less leg pain, within twelve weeks, by following this protocol.  Pain Scale    0_____1_____2_____3_____4_____5_____6_____7_____8_____9_____10   No Pain                                   Moderate Pain                                 Maximal Pain    

## 2021-08-02 ENCOUNTER — Other Ambulatory Visit: Payer: Medicare HMO

## 2021-09-01 DIAGNOSIS — Z79899 Other long term (current) drug therapy: Secondary | ICD-10-CM | POA: Diagnosis not present

## 2021-09-01 DIAGNOSIS — I10 Essential (primary) hypertension: Secondary | ICD-10-CM | POA: Diagnosis not present

## 2021-09-01 DIAGNOSIS — Z0000 Encounter for general adult medical examination without abnormal findings: Secondary | ICD-10-CM | POA: Diagnosis not present

## 2021-09-01 DIAGNOSIS — G4700 Insomnia, unspecified: Secondary | ICD-10-CM | POA: Diagnosis not present

## 2021-09-01 DIAGNOSIS — E039 Hypothyroidism, unspecified: Secondary | ICD-10-CM | POA: Diagnosis not present

## 2021-09-01 DIAGNOSIS — E7800 Pure hypercholesterolemia, unspecified: Secondary | ICD-10-CM | POA: Diagnosis not present

## 2021-09-01 DIAGNOSIS — E559 Vitamin D deficiency, unspecified: Secondary | ICD-10-CM | POA: Diagnosis not present

## 2021-09-01 DIAGNOSIS — Z23 Encounter for immunization: Secondary | ICD-10-CM | POA: Diagnosis not present

## 2021-09-01 DIAGNOSIS — E1121 Type 2 diabetes mellitus with diabetic nephropathy: Secondary | ICD-10-CM | POA: Diagnosis not present

## 2021-09-01 DIAGNOSIS — E1140 Type 2 diabetes mellitus with diabetic neuropathy, unspecified: Secondary | ICD-10-CM | POA: Diagnosis not present

## 2021-09-13 DIAGNOSIS — Z1211 Encounter for screening for malignant neoplasm of colon: Secondary | ICD-10-CM | POA: Diagnosis not present

## 2021-10-05 DIAGNOSIS — I10 Essential (primary) hypertension: Secondary | ICD-10-CM | POA: Diagnosis not present

## 2021-10-05 DIAGNOSIS — E1121 Type 2 diabetes mellitus with diabetic nephropathy: Secondary | ICD-10-CM | POA: Diagnosis not present

## 2021-10-05 DIAGNOSIS — N182 Chronic kidney disease, stage 2 (mild): Secondary | ICD-10-CM | POA: Diagnosis not present

## 2021-10-05 DIAGNOSIS — E7800 Pure hypercholesterolemia, unspecified: Secondary | ICD-10-CM | POA: Diagnosis not present

## 2021-10-05 DIAGNOSIS — E039 Hypothyroidism, unspecified: Secondary | ICD-10-CM | POA: Diagnosis not present
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
## 2021-10-23 ENCOUNTER — Encounter: Payer: Self-pay | Admitting: Nurse Practitioner

## 2021-10-23 ENCOUNTER — Ambulatory Visit: Payer: Medicare HMO | Attending: Nurse Practitioner | Admitting: Nurse Practitioner

## 2021-10-23 VITALS — BP 128/70 | HR 78 | Ht 64.0 in | Wt 255.2 lb

## 2021-10-23 DIAGNOSIS — I2510 Atherosclerotic heart disease of native coronary artery without angina pectoris: Secondary | ICD-10-CM

## 2021-10-23 DIAGNOSIS — E785 Hyperlipidemia, unspecified: Secondary | ICD-10-CM

## 2021-10-23 DIAGNOSIS — I10 Essential (primary) hypertension: Secondary | ICD-10-CM

## 2021-10-23 DIAGNOSIS — I739 Peripheral vascular disease, unspecified: Secondary | ICD-10-CM | POA: Diagnosis not present

## 2021-10-23 DIAGNOSIS — J849 Interstitial pulmonary disease, unspecified: Secondary | ICD-10-CM

## 2021-10-23 NOTE — Patient Instructions (Signed)
Medication Instructions:  Your physician recommends that you continue on your current medications as directed. Please refer to the Current Medication list given to you today.  *If you need a refill on your cardiac medications before your next appointment, please call your pharmacy*   Lab Work: None ordered  If you have labs (blood work) drawn today and your tests are completely normal, you will receive your results only by: [place] (if you have MyChart) OR A paper copy in the mail If you have any lab test that is abnormal or we need to change your treatment, we will call you to review the results.   Testing/Procedures: None ordered   Follow-Up: At [facility], you and your health needs are our priority.  As part of our continuing mission to provide you with exceptional heart care, we have created designated Provider Care Teams.  These Care Teams include your primary Cardiologist (physician) and Advanced Practice Providers (APPs -  Physician Assistants and Nurse Practitioners) who all work together to provide you with the care you need, when you need it.  We recommend signing up for the patient portal called "MyChart".  Sign up information is provided on this After Visit Summary.  MyChart is used to connect with patients for Virtual Visits (Telemedicine).  Patients are able to view lab/test results, encounter notes, upcoming appointments, etc.  Non-urgent messages can be sent to your provider as well.   To learn more about what you can do with MyChart, go to [email or web address].    Your next appointment:   6 month(s)  The format for your next appointment:   In Person  Provider:   Ambrose Pancoast, NP         Other Instructions   Important Information About Sugar

## 2021-11-01 DIAGNOSIS — M170 Bilateral primary osteoarthritis of knee: Secondary | ICD-10-CM | POA: Diagnosis not present

## 2021-11-03 ENCOUNTER — Ambulatory Visit
Admission: RE | Admit: 2021-11-03 | Discharge: 2021-11-03 | Disposition: A | Discharge status: Home / Self Care | Payer: Medicaid Other | Source: Ambulatory Visit | Attending: Emergency Medicine | Admitting: Emergency Medicine

## 2021-11-03 DIAGNOSIS — J849 Interstitial pulmonary disease, unspecified: Secondary | ICD-10-CM

## 2021-11-03 DIAGNOSIS — S2232XA Fracture of one rib, left side, initial encounter for closed fracture: Secondary | ICD-10-CM | POA: Diagnosis not present

## 2021-11-03 DIAGNOSIS — J84112 Idiopathic pulmonary fibrosis: Secondary | ICD-10-CM | POA: Diagnosis not present

## 2021-11-03 DIAGNOSIS — J479 Bronchiectasis, uncomplicated: Secondary | ICD-10-CM | POA: Diagnosis not present

## 2021-11-03 DIAGNOSIS — J439 Emphysema, unspecified: Secondary | ICD-10-CM | POA: Diagnosis not present

## 2021-11-08 DIAGNOSIS — M170 Bilateral primary osteoarthritis of knee: Secondary | ICD-10-CM | POA: Diagnosis not present

## 2021-11-15 ENCOUNTER — Encounter: Payer: Self-pay | Admitting: Emergency Medicine

## 2021-11-15 ENCOUNTER — Ambulatory Visit (INDEPENDENT_AMBULATORY_CARE_PROVIDER_SITE_OTHER): Payer: Medicare HMO | Admitting: Emergency Medicine

## 2021-11-15 VITALS — BP 138/76 | HR 76 | Temp 97.8°F | Ht 65.0 in | Wt 251.0 lb

## 2021-11-15 DIAGNOSIS — M170 Bilateral primary osteoarthritis of knee: Secondary | ICD-10-CM | POA: Diagnosis not present

## 2021-11-15 DIAGNOSIS — J849 Interstitial pulmonary disease, unspecified: Secondary | ICD-10-CM | POA: Diagnosis not present

## 2021-11-15 NOTE — Patient Instructions (Signed)
We reviewed your CT scan of the chest today. We talked about the pros and cons of trying prednisone to treat lung inflammation.  Given the side effect profile we will continue to hold off on this medication.  Dr. Lamonte Sakai will review your case with colleagues to see if there may be an alternative anti-inflammatory medication that can be started We will repeat your CT scan of the chest in 6 months Follow Dr. Lamonte Sakai in 6 months or sooner if any problems

## 2021-11-15 NOTE — Progress Notes (Signed)
   Subjective:    Patient ID: [name], female    DOB: [birth year], 66 y.o.   MRN: [id number]  HPI  ROV 06/28/21 --66 year old woman, former smoker, history of OSA and an abnormal CT scan of the chest characterized by some mediastinal adenopathy, mixed groundglass infiltrate and interstitial change of unclear cause.  She underwent EBUS and navigational bronchoscopy 05/08/21.  Right upper lobe, left lower lobe transbronchial biopsies negative for malignancy.  No malignancy or granulomas seen in her mediastinal nodes.  She follows up today and her cultures are all completed, all negative. Her ANA is positive.    ROV 11/15/21 --follow-up visit for 66 year old woman with history of former tobacco use (15 pk-yrs), OSA, positive ANA.  She underwent EBUS and navigational bronchoscopy 05/08/2021 for bilateral mixed groundglass infiltrates and mediastinal adenopathy.  Her cytology from all locations was reassuring, no evidence of granulomas or malignancy.  Suspected possible sarcoidosis, consider treatment with prednisone but she was hesitant to do so given side effects - she is adamant that she is not going to take it.  We planned to repeat her CT scan of the chest, done 9/29 as below. She is having paroxysmal cough, leads to emesis. She has albuterol, uses a few times a week.   High-resolution CT chest 11/03/2021 reviewed by me, shows paraseptal emphysematous changes with some diffuse interstitial groundglass and traction bronchiectasis, little change from 04/2021 but progressive compared with 10/2019.  Some scattered pulmonary nodules up to 13 mm in the subpleural anterior lateral right upper lobe, unchanged.   Review of Systems As per HPI     Objective:   Physical Exam Vitals:   11/15/21 1123  BP: 138/76  Pulse: 76  Temp: 97.8 F (36.6 C)  SpO2: 95%    Gen: Pleasant, obese woman, in no distress,  normal affect, frequent cough  ENT: No lesions,  mouth clear,  oropharynx clear, no postnasal  drip  Neck: No JVD, no stridor  Lungs: No use of accessory muscles, no crackles or wheezing on normal respiration, no wheeze on forced expiration  Cardiovascular: RRR, heart sounds normal, no murmur or gallops, no peripheral edema  Musculoskeletal: No deformities, no cyanosis or clubbing  Neuro: alert, awake, non focal  Skin: Warm, no lesions or rash      Assessment & Plan:  ILD (interstitial lung disease) ([place]) I suspect that this is sarcoidosis although she did not have granulomas on her biopsies.  She does have a positive ANA of unclear significance.  Consider sending to rheumatology.  I wanted to do a steroid trial to see if she would get clinical and radiographical benefit but she is very hesitant to do so, wants to avoid prednisone at all costs given the side effect profile, weight gain.  It may be reasonable to discuss with ILD colleagues to see if they would recommend starting an alternative without a steroid trial.  For now we will continue to follow serial imaging.  She is quite symptomatic, has unrelenting cough.  Again I think ultimately she will require a short course of corticosteroids to try and clear this.   Baltazar Apo, MD, PhD 11/15/2021, 12:15 PM  Pulmonary and Critical Care [phone] or if no answer [phone]

## 2021-11-15 NOTE — Assessment & Plan Note (Signed)
I suspect that this is sarcoidosis although she did not have granulomas on her biopsies.  She does have a positive ANA of unclear significance.  Consider sending to rheumatology.  I wanted to do a steroid trial to see if she would get clinical and radiographical benefit but she is very hesitant to do so, wants to avoid prednisone at all costs given the side effect profile, weight gain.  It may be reasonable to discuss with ILD colleagues to see if they would recommend starting an alternative without a steroid trial.  For now we will continue to follow serial imaging.  She is quite symptomatic, has unrelenting cough.  Again I think ultimately she will require a short course of corticosteroids to try and clear this.

## 2021-11-17 ENCOUNTER — Other Ambulatory Visit: Payer: Self-pay | Admitting: Nurse Practitioner

## 2021-11-17 DIAGNOSIS — K7469 Other cirrhosis of liver: Secondary | ICD-10-CM

## 2021-11-18 ENCOUNTER — Ambulatory Visit (HOSPITAL_COMMUNITY)
Admission: EM | Admit: 2021-11-18 | Discharge: 2021-11-18 | Disposition: A | Discharge status: Home / Self Care | Payer: Medicare HMO

## 2021-11-18 ENCOUNTER — Encounter (HOSPITAL_COMMUNITY): Payer: Self-pay | Admitting: Emergency Medicine

## 2021-11-18 DIAGNOSIS — Z79899 Other long term (current) drug therapy: Secondary | ICD-10-CM | POA: Diagnosis not present

## 2021-11-18 DIAGNOSIS — M542 Cervicalgia: Secondary | ICD-10-CM | POA: Diagnosis not present

## 2021-11-18 DIAGNOSIS — Z8673 Personal history of transient ischemic attack (TIA), and cerebral infarction without residual deficits: Secondary | ICD-10-CM | POA: Diagnosis not present

## 2021-11-18 DIAGNOSIS — I6523 Occlusion and stenosis of bilateral carotid arteries: Secondary | ICD-10-CM | POA: Diagnosis not present

## 2021-11-18 NOTE — ED Triage Notes (Addendum)
Pt reports left side neck pain. Started around 1 pm. States the pain is radiating up into her head. Reports having a surgery a few years ago from a clogged artery on the left side of neck. Eating and drinking makes the pain worse. Denies chest pain.

## 2021-11-18 NOTE — ED Notes (Signed)
Patient is being discharged from the Urgent Care and sent to the Emergency Department via POV. Per Vincente Liberty NP, patient is in need of higher level of care due to neck pain/ hx of clogged artery on same side. Patient is aware and verbalizes understanding of plan of care.  Vitals:   11/18/21 1624  BP: 136/65  Pulse: 69  Resp: 18  Temp: (!) 97.5 F (36.4 C)  SpO2: 96%

## 2021-11-23 ENCOUNTER — Ambulatory Visit
Admission: RE | Admit: 2021-11-23 | Discharge: 2021-11-23 | Disposition: A | Discharge status: Home / Self Care | Payer: Medicare HMO | Source: Ambulatory Visit | Attending: Nurse Practitioner | Admitting: Nurse Practitioner

## 2021-11-23 DIAGNOSIS — K7469 Other cirrhosis of liver: Secondary | ICD-10-CM

## 2021-11-23 DIAGNOSIS — K7460 Unspecified cirrhosis of liver: Secondary | ICD-10-CM | POA: Diagnosis not present

## 2021-12-12 ENCOUNTER — Other Ambulatory Visit: Payer: Self-pay | Admitting: Cardiovascular Disease

## 2021-12-23 DIAGNOSIS — J1281 Pneumonia due to SARS-associated coronavirus: Secondary | ICD-10-CM | POA: Diagnosis not present

## 2021-12-23 DIAGNOSIS — U071 COVID-19: Secondary | ICD-10-CM | POA: Diagnosis not present

## 2022-01-10 DIAGNOSIS — N3000 Acute cystitis without hematuria: Secondary | ICD-10-CM | POA: Diagnosis not present

## 2022-01-10 DIAGNOSIS — G4733 Obstructive sleep apnea (adult) (pediatric): Secondary | ICD-10-CM | POA: Diagnosis not present

## 2022-01-10 DIAGNOSIS — E1165 Type 2 diabetes mellitus with hyperglycemia: Secondary | ICD-10-CM | POA: Diagnosis not present

## 2022-01-16 ENCOUNTER — Ambulatory Visit: Payer: Medicare HMO | Attending: Cardiovascular Disease | Admitting: Cardiovascular Disease

## 2022-01-16 ENCOUNTER — Encounter: Payer: Self-pay | Admitting: Cardiovascular Disease

## 2022-01-16 VITALS — BP 116/62 | HR 80 | Ht 65.0 in | Wt 249.2 lb

## 2022-01-16 DIAGNOSIS — E785 Hyperlipidemia, unspecified: Secondary | ICD-10-CM

## 2022-01-16 DIAGNOSIS — I779 Disorder of arteries and arterioles, unspecified: Secondary | ICD-10-CM | POA: Diagnosis not present

## 2022-01-16 DIAGNOSIS — I2510 Atherosclerotic heart disease of native coronary artery without angina pectoris: Secondary | ICD-10-CM

## 2022-01-16 DIAGNOSIS — I10 Essential (primary) hypertension: Secondary | ICD-10-CM | POA: Diagnosis not present

## 2022-01-16 DIAGNOSIS — I739 Peripheral vascular disease, unspecified: Secondary | ICD-10-CM | POA: Diagnosis not present
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
## 2022-01-16 NOTE — Patient Instructions (Addendum)
Medication Instructions:  No changes *If you need a refill on your cardiac medications before your next appointment, please call your pharmacy*   Lab Work: Your provider would like for you to have the following labs today: BMET and CBC  If you have labs (blood work) drawn today and your tests are completely normal, you will receive your results only by: MyChart Message (if you have MyChart) OR A paper copy in the mail If you have any lab test that is abnormal or we need to change your treatment, we will call you to review the results.   Testing/Procedures: Your physician has requested that you have a peripheral vascular angiogram. This exam is performed at the hospital. During this exam IV contrast is used to look at arterial blood flow. Please review the information sheet given for details.   Follow-Up: At [facility], you and your health needs are our priority.  As part of our continuing mission to provide you with exceptional heart care, we have created designated Provider Care Teams.  These Care Teams include your primary Cardiologist (physician) and Advanced Practice Providers (APPs -  Physician Assistants and Nurse Practitioners) who all work together to provide you with the care you need, when you need it.  We recommend signing up for the patient portal called "MyChart".  Sign up information is provided on this After Visit Summary.  MyChart is used to connect with patients for Virtual Visits (Telemedicine).  Patients are able to view lab/test results, encounter notes, upcoming appointments, etc.  Non-urgent messages can be sent to your provider as well.   To learn more about what you can do with MyChart, go to [email or web address].    Your next appointment:   Follow up with Amanda Course, PA on 1/11 at 3:35 pm (post procedure) Other Instructions       Cardiac/Peripheral Catheterization   You are scheduled for a Peripheral Angiogram on Wednesday, December 20 with Dr.  Lorine Hood.  1. Please arrive at the Main Entrance A at [facility]: [address] [place], [place] [address] on December 20 at 6:30 AM (This time is two hours before your procedure to ensure your preparation). Free valet parking service is available. You will check in at ADMITTING. The support person will be asked to wait in the waiting room.  It is OK to have someone drop you off and come back when you are ready to be discharged.        Special note: Every effort is made to have your procedure done on time. Please understand that emergencies sometimes delay scheduled procedures.   . 2. Diet: Do not eat solid foods after midnight.  You may have clear liquids until 5 AM the day of the procedure.  3. Labs: You will need to have blood drawn on: 12/12.  You do not need to be fasting.  4. Medication instructions in preparation for your procedure: Hold all diabetic medications the morning of the procedure Hold the spironolactone the morning of the procedure  On the morning of your procedure, take Aspirin 81 mg and any morning medicines NOT listed above.  You may use sips of water.  5. Plan to go home the same day, you will only stay overnight if medically necessary. 6. You MUST have a responsible adult to drive you home. 7. An adult MUST be with you the first 24 hours after you arrive home. 8. Bring a current list of your medications, and the last time and  date medication taken. 9. Bring ID and current insurance cards. 10.Please wear clothes that are easy to get on and off and wear slip-on shoes.  Thank you for allowing [place] to care for you!   -- [name] In[facility] Cardi[facility] services

## 2022-01-17 LAB — CBC
Hematocrit: 47 % — ABNORMAL HIGH (ref 34.0–46.6)
Hemoglobin: 14.9 g/dL (ref 11.1–15.9)
MCH: 27 pg (ref 26.6–33.0)
MCHC: 31.7 g/dL (ref 31.5–35.7)
MCV: 85 fL (ref 79–97)
Platelets: 262 10*3/uL (ref 150–450)
RBC: 5.52 x10E6/uL — ABNORMAL HIGH (ref 3.77–5.28)
RDW: 14.2 % (ref 11.7–15.4)
WBC: 5.5 10*3/uL (ref 3.4–10.8)

## 2022-01-17 LAB — BASIC METABOLIC PANEL
BUN/Creatinine Ratio: 8 — ABNORMAL LOW (ref 12–28)
BUN: 7 mg/dL — ABNORMAL LOW (ref 8–27)
CO2: 22 mmol/L (ref 20–29)
Calcium: 9.8 mg/dL (ref 8.7–10.3)
Chloride: 102 mmol/L (ref 96–106)
Creatinine, Ser: 0.88 mg/dL (ref 0.57–1.00)
Glucose: 131 mg/dL — ABNORMAL HIGH (ref 70–99)
Potassium: 3.8 mmol/L (ref 3.5–5.2)
Sodium: 140 mmol/L (ref 134–144)
eGFR: 72 mL/min/{1.73_m2} (ref >59)

## 2022-01-22 ENCOUNTER — Telehealth: Payer: Self-pay | Admitting: *Deleted

## 2022-01-22 NOTE — Telephone Encounter (Signed)
Abdominal aortogram scheduled at [facility] for: Wednesday January 24, 2022 8:30 AM Arrival time and place: [facility] Main Entrance A at: 6:30 AM  Nothing to eat after midnight prior to procedure, clear liquids until 5 AM day of procedure.  Medication instructions: -Hold:  Insulin-AM of procedure/1/2 usual dose HS prior to procedure  Spironolactone-AM of procedure -Except hold medications usual morning medications can be taken with sips of water including aspirin 81 mg.  Confirmed patient has responsible adult to drive home post procedure and be with patient first 24 hours after arriving home.  Patient reports no new symptoms concerning for COVID-19 in the past 10 days.  Reviewed procedure instructions with patient.

## 2022-01-24 ENCOUNTER — Encounter (HOSPITAL_COMMUNITY)
Admission: RE | Disposition: A | Discharge status: Home / Self Care | Payer: Self-pay | Source: Home / Self Care | Attending: Cardiovascular Disease

## 2022-01-24 ENCOUNTER — Ambulatory Visit (HOSPITAL_COMMUNITY)
Admission: RE | Admit: 2022-01-24 | Discharge: 2022-01-24 | Disposition: A | Discharge status: Home / Self Care | Payer: Medicare HMO | Attending: Cardiovascular Disease | Admitting: Cardiovascular Disease

## 2022-01-24 ENCOUNTER — Other Ambulatory Visit: Payer: Self-pay

## 2022-01-24 DIAGNOSIS — Z8249 Family history of ischemic heart disease and other diseases of the circulatory system: Secondary | ICD-10-CM | POA: Diagnosis not present

## 2022-01-24 DIAGNOSIS — E1151 Type 2 diabetes mellitus with diabetic peripheral angiopathy without gangrene: Secondary | ICD-10-CM | POA: Insufficient documentation

## 2022-01-24 DIAGNOSIS — Z833 Family history of diabetes mellitus: Secondary | ICD-10-CM | POA: Diagnosis not present

## 2022-01-24 DIAGNOSIS — Z8673 Personal history of transient ischemic attack (TIA), and cerebral infarction without residual deficits: Secondary | ICD-10-CM | POA: Insufficient documentation

## 2022-01-24 DIAGNOSIS — I10 Essential (primary) hypertension: Secondary | ICD-10-CM | POA: Diagnosis not present

## 2022-01-24 DIAGNOSIS — E785 Hyperlipidemia, unspecified: Secondary | ICD-10-CM | POA: Insufficient documentation

## 2022-01-24 DIAGNOSIS — I70213 Atherosclerosis of native arteries of extremities with intermittent claudication, bilateral legs: Secondary | ICD-10-CM

## 2022-01-24 DIAGNOSIS — K219 Gastro-esophageal reflux disease without esophagitis: Secondary | ICD-10-CM | POA: Diagnosis not present

## 2022-01-24 DIAGNOSIS — Z87891 Personal history of nicotine dependence: Secondary | ICD-10-CM | POA: Diagnosis not present

## 2022-01-24 DIAGNOSIS — Z7902 Long term (current) use of antithrombotics/antiplatelets: Secondary | ICD-10-CM | POA: Insufficient documentation

## 2022-01-24 DIAGNOSIS — E039 Hypothyroidism, unspecified: Secondary | ICD-10-CM | POA: Diagnosis not present

## 2022-01-24 DIAGNOSIS — Z7985 Long-term (current) use of injectable non-insulin antidiabetic drugs: Secondary | ICD-10-CM | POA: Diagnosis not present

## 2022-01-24 DIAGNOSIS — I2510 Atherosclerotic heart disease of native coronary artery without angina pectoris: Secondary | ICD-10-CM | POA: Insufficient documentation

## 2022-01-24 DIAGNOSIS — Z823 Family history of stroke: Secondary | ICD-10-CM | POA: Insufficient documentation

## 2022-01-24 DIAGNOSIS — Z794 Long term (current) use of insulin: Secondary | ICD-10-CM | POA: Diagnosis not present

## 2022-01-24 DIAGNOSIS — I701 Atherosclerosis of renal artery: Secondary | ICD-10-CM | POA: Diagnosis not present

## 2022-01-24 DIAGNOSIS — I739 Peripheral vascular disease, unspecified: Secondary | ICD-10-CM

## 2022-01-24 DIAGNOSIS — J849 Interstitial pulmonary disease, unspecified: Secondary | ICD-10-CM | POA: Diagnosis not present

## 2022-01-24 DIAGNOSIS — I779 Disorder of arteries and arterioles, unspecified: Secondary | ICD-10-CM | POA: Diagnosis not present

## 2022-01-24 HISTORY — PX: ABDOMINAL AORTOGRAM W/LOWER EXTREMITY: CATH118223

## 2022-01-24 LAB — GLUCOSE, CAPILLARY
Glucose-Capillary: 181 mg/dL — ABNORMAL HIGH (ref 70–99)
Glucose-Capillary: 137 mg/dL — ABNORMAL HIGH (ref 70–99)

## 2022-01-24 SURGERY — ABDOMINAL AORTOGRAM W/LOWER EXTREMITY
Anesthesia: LOCAL

## 2022-01-24 MED ORDER — HEPARIN (PORCINE) IN NACL 1000-0.9 UT/500ML-% IV SOLN
INTRAVENOUS | Status: AC
  Filled 2022-01-24: qty 1000

## 2022-01-24 MED ORDER — LIDOCAINE HCL (PF) 1 % IJ SOLN
INTRAMUSCULAR | Status: DC | PRN
  Administered 2022-01-24: 10 mL

## 2022-01-24 MED ORDER — FENTANYL CITRATE (PF) 100 MCG/2ML IJ SOLN
INTRAMUSCULAR | Status: DC | PRN
  Administered 2022-01-24: 50 ug via INTRAVENOUS

## 2022-01-24 MED ORDER — SODIUM CHLORIDE 0.9% FLUSH: 3.0000 mL | INTRAVENOUS | Status: DC | PRN

## 2022-01-24 MED ORDER — IODIXANOL 320 MG/ML IV SOLN
INTRAVENOUS | Status: DC | PRN
  Administered 2022-01-24: 128 mL via INTRA_ARTERIAL

## 2022-01-24 MED ORDER — FENTANYL CITRATE (PF) 100 MCG/2ML IJ SOLN
INTRAMUSCULAR | Status: AC
  Filled 2022-01-24: qty 2

## 2022-01-24 MED ORDER — LABETALOL HCL 5 MG/ML IV SOLN: 10.0000 mg | INTRAVENOUS | Status: DC | PRN

## 2022-01-24 MED ORDER — SODIUM CHLORIDE 0.9 % IV SOLN: INTRAVENOUS | Status: DC

## 2022-01-24 MED ORDER — MIDAZOLAM HCL 2 MG/2ML IJ SOLN
INTRAMUSCULAR | Status: AC
  Filled 2022-01-24: qty 2

## 2022-01-24 MED ORDER — SODIUM CHLORIDE 0.9% FLUSH: 3.0000 mL | Freq: Two times a day (BID) | INTRAVENOUS | Status: DC

## 2022-01-24 MED ORDER — LIDOCAINE HCL (PF) 1 % IJ SOLN
INTRAMUSCULAR | Status: AC
  Filled 2022-01-24: qty 30

## 2022-01-24 MED ORDER — HYDRALAZINE HCL 20 MG/ML IJ SOLN: 5.0000 mg | INTRAMUSCULAR | Status: DC | PRN

## 2022-01-24 MED ORDER — ASPIRIN 81 MG PO CHEW
81.0000 mg | CHEWABLE_TABLET | ORAL | Status: AC
  Administered 2022-01-24: 81 mg via ORAL
  Filled 2022-01-24: qty 1

## 2022-01-24 MED ORDER — ONDANSETRON HCL 4 MG/2ML IJ SOLN: 4.0000 mg | Freq: Four times a day (QID) | INTRAMUSCULAR | Status: DC | PRN

## 2022-01-24 MED ORDER — MIDAZOLAM HCL 2 MG/2ML IJ SOLN
INTRAMUSCULAR | Status: DC | PRN
  Administered 2022-01-24: 1 mg via INTRAVENOUS

## 2022-01-24 MED ORDER — SODIUM CHLORIDE 0.9 % IV SOLN: 250.0000 mL | INTRAVENOUS | Status: DC | PRN

## 2022-01-24 MED ORDER — HEPARIN (PORCINE) IN NACL 1000-0.9 UT/500ML-% IV SOLN
INTRAVENOUS | Status: DC | PRN
  Administered 2022-01-24 (×2): 500 mL

## 2022-01-24 MED ORDER — ACETAMINOPHEN 325 MG PO TABS: 650.0000 mg | ORAL_TABLET | ORAL | Status: DC | PRN

## 2022-01-24 SURGICAL SUPPLY — 13 items
CATH ANGIO 5F PIGTAIL 65CM (CATHETERS) IMPLANT
CATH STRAIGHT 5FR 65CM (CATHETERS) IMPLANT
CATH TEMPO 5F RIM 65CM (CATHETERS) IMPLANT
DEVICE CLOSURE MYNXGRIP 5F (Vascular Products) IMPLANT
KIT MICROPUNCTURE NIT STIFF (SHEATH) IMPLANT
KIT PV (KITS) ×1 IMPLANT
SHEATH PINNACLE 5F 10CM (SHEATH) IMPLANT
SHEATH PROBE COVER 6X72 (BAG) IMPLANT
SYR MEDRAD MARK 7 150ML (SYRINGE) ×1 IMPLANT
TRANSDUCER W/STOPCOCK (MISCELLANEOUS) ×1 IMPLANT
TRAY PV CATH (CUSTOM PROCEDURE TRAY) ×1 IMPLANT
TUBING CIL FLEX 10 FLL-RA (TUBING) IMPLANT
WIRE HITORQ VERSACORE ST 145CM (WIRE) IMPLANT

## 2022-01-24 NOTE — Interval H&P Note (Signed)
History and Physical Interval Note:  01/24/2022 8:56 AM  [name]  has presented today for surgery, with the diagnosis of PAD.  The various methods of treatment have been discussed with the patient and family. After consideration of risks, benefits and other options for treatment, the patient has consented to  Procedure(s): ABDOMINAL AORTOGRAM W/LOWER EXTREMITY (N/A) as a surgical intervention.  The patient's history has been reviewed, patient examined, no change in status, stable for surgery.  I have reviewed the patient's chart and labs.  Questions were answered to the patient's satisfaction.     Lorine Bears

## 2022-01-24 NOTE — Progress Notes (Signed)
Up and walked and tolerated well; right groin stable, no bleeding or hematoma 

## 2022-01-25 ENCOUNTER — Encounter (HOSPITAL_COMMUNITY): Payer: Self-pay | Admitting: Cardiovascular Disease

## 2022-01-26 DIAGNOSIS — M170 Bilateral primary osteoarthritis of knee: Secondary | ICD-10-CM | POA: Diagnosis not present

## 2022-01-30 ENCOUNTER — Encounter (HOSPITAL_COMMUNITY): Payer: Self-pay | Admitting: Cardiovascular Disease

## 2022-02-07 DIAGNOSIS — J439 Emphysema, unspecified: Secondary | ICD-10-CM | POA: Diagnosis not present

## 2022-02-07 DIAGNOSIS — E871 Hypo-osmolality and hyponatremia: Secondary | ICD-10-CM | POA: Diagnosis not present

## 2022-02-07 DIAGNOSIS — J9601 Acute respiratory failure with hypoxia: Secondary | ICD-10-CM | POA: Diagnosis not present

## 2022-02-07 DIAGNOSIS — J984 Other disorders of lung: Secondary | ICD-10-CM | POA: Diagnosis not present

## 2022-02-07 DIAGNOSIS — R918 Other nonspecific abnormal finding of lung field: Secondary | ICD-10-CM | POA: Diagnosis not present

## 2022-02-07 DIAGNOSIS — R0602 Shortness of breath: Secondary | ICD-10-CM | POA: Diagnosis not present

## 2022-02-07 DIAGNOSIS — I2510 Atherosclerotic heart disease of native coronary artery without angina pectoris: Secondary | ICD-10-CM | POA: Diagnosis not present

## 2022-02-07 DIAGNOSIS — E1165 Type 2 diabetes mellitus with hyperglycemia: Secondary | ICD-10-CM | POA: Diagnosis not present

## 2022-02-07 DIAGNOSIS — Z6841 Body Mass Index (BMI) 40.0 and over, adult: Secondary | ICD-10-CM | POA: Diagnosis not present

## 2022-02-07 DIAGNOSIS — J189 Pneumonia, unspecified organism: Secondary | ICD-10-CM | POA: Diagnosis not present

## 2022-02-07 DIAGNOSIS — E038 Other specified hypothyroidism: Secondary | ICD-10-CM | POA: Diagnosis not present

## 2022-02-07 DIAGNOSIS — J168 Pneumonia due to other specified infectious organisms: Secondary | ICD-10-CM | POA: Diagnosis not present

## 2022-02-07 DIAGNOSIS — Z794 Long term (current) use of insulin: Secondary | ICD-10-CM | POA: Diagnosis not present

## 2022-02-07 DIAGNOSIS — J101 Influenza due to other identified influenza virus with other respiratory manifestations: Secondary | ICD-10-CM | POA: Diagnosis not present

## 2022-02-07 DIAGNOSIS — J849 Interstitial pulmonary disease, unspecified: Secondary | ICD-10-CM | POA: Diagnosis not present

## 2022-02-07 DIAGNOSIS — E782 Mixed hyperlipidemia: Secondary | ICD-10-CM | POA: Diagnosis not present

## 2022-02-08 DIAGNOSIS — J189 Pneumonia, unspecified organism: Secondary | ICD-10-CM | POA: Diagnosis not present

## 2022-02-09 DIAGNOSIS — J189 Pneumonia, unspecified organism: Secondary | ICD-10-CM | POA: Diagnosis not present

## 2022-02-14 ENCOUNTER — Other Ambulatory Visit (HOSPITAL_COMMUNITY): Payer: Self-pay

## 2022-02-15 ENCOUNTER — Ambulatory Visit: Payer: Medicare HMO | Admitting: Physician Assistant

## 2022-02-15 DIAGNOSIS — J45909 Unspecified asthma, uncomplicated: Secondary | ICD-10-CM | POA: Diagnosis not present

## 2022-02-15 DIAGNOSIS — J189 Pneumonia, unspecified organism: Secondary | ICD-10-CM | POA: Diagnosis not present
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
## 2022-03-06 ENCOUNTER — Encounter: Payer: Self-pay | Admitting: Cardiovascular Disease

## 2022-03-06 ENCOUNTER — Ambulatory Visit: Payer: Medicare HMO | Attending: Physician Assistant | Admitting: Cardiovascular Disease

## 2022-03-06 VITALS — BP 132/80 | HR 76 | Ht 65.0 in | Wt 244.2 lb

## 2022-03-06 DIAGNOSIS — I779 Disorder of arteries and arterioles, unspecified: Secondary | ICD-10-CM

## 2022-03-06 DIAGNOSIS — I701 Atherosclerosis of renal artery: Secondary | ICD-10-CM

## 2022-03-06 DIAGNOSIS — I2510 Atherosclerotic heart disease of native coronary artery without angina pectoris: Secondary | ICD-10-CM

## 2022-03-06 DIAGNOSIS — I739 Peripheral vascular disease, unspecified: Secondary | ICD-10-CM | POA: Diagnosis not present

## 2022-03-06 DIAGNOSIS — I10 Essential (primary) hypertension: Secondary | ICD-10-CM

## 2022-03-06 DIAGNOSIS — E785 Hyperlipidemia, unspecified: Secondary | ICD-10-CM

## 2022-03-06 NOTE — Patient Instructions (Signed)
Medication Instructions:  No changes *If you need a refill on your cardiac medications before your next appointment, please call your pharmacy*   Lab Work: None ordered If you have labs (blood work) drawn today and your tests are completely normal, you will receive your results only by: MyChart Message (if you have MyChart) OR A paper copy in the mail If you have any lab test that is abnormal or we need to change your treatment, we will call you to review the results.   Testing/Procedures: None ordered   Follow-Up: At [facility], you and your health needs are our priority.  As part of our continuing mission to provide you with exceptional heart care, we have created designated Provider Care Teams.  These Care Teams include your primary Cardiologist (physician) and Advanced Practice Providers (APPs -  Physician Assistants and Nurse Practitioners) who all work together to provide you with the care you need, when you need it.  We recommend signing up for the patient portal called "MyChart".  Sign up information is provided on this After Visit Summary.  MyChart is used to connect with patients for Virtual Visits (Telemedicine).  Patients are able to view lab/test results, encounter notes, upcoming appointments, etc.  Non-urgent messages can be sent to your provider as well.   To learn more about what you can do with MyChart, go to [email or web address].    Your next appointment:   6 month(s)  Provider:   Dr. Arida  Other Instructions EXERCISE PROGRAM FOR INDIVIDUALS WITH  PERIPHERAL ARTERIAL DISEASE (PAD)   General Information:   Research in vascular exercise has demonstrated remarkable improvement in symptoms of leg pain (claudication) without expensive or invasive interventions. Regular walking programs are extremely helpful for patients with PAD and intermittent claudication.  These steps are designed to help you get started with a safe and effective program to help  you walk farther with less pain:   Walk at least three times a week (preferably every day).  Your goal is to build up to 30-45 minutes of total walking time (not counting rest breaks). It may take you several weeks to build up your exercise time starting at 5-10 minutes or whatever you can tolerate.  Walk as far as possible using moderate to maximal pain (7-8 on the scale below) as a signal to stop, and resume walking when the pain goes away.  On a treadmill, set the speed and grade at a level that brings on the claudication pain within 3 to 5 minutes. Walk at this rate until you experience claudication of moderate severity, rest until the pain improves, and then resume walking.  Over time, you will be able to walk longer at the designated speed and grade; workload should then be increased until you develop the pain within 3 to 5 minutes once again.  This regimen will induce a significant benefit. Studies have demonstrated that participants may be able to walk up to three or four times farther and have less leg pain, within twelve weeks, by following this protocol.  Pain Scale    0_____1_____2_____3_____4_____5_____6_____7_____8_____9_____10   No Pain                                   Moderate Pain                               Maximal 

## 2022-03-08 ENCOUNTER — Other Ambulatory Visit: Payer: Self-pay | Admitting: Family Medicine

## 2022-03-08 DIAGNOSIS — Z1231 Encounter for screening mammogram for malignant neoplasm of breast: Secondary | ICD-10-CM

## 2022-03-13 ENCOUNTER — Other Ambulatory Visit: Payer: Self-pay | Admitting: Family Medicine

## 2022-03-13 ENCOUNTER — Ambulatory Visit
Admission: RE | Admit: 2022-03-13 | Discharge: 2022-03-13 | Disposition: A | Discharge status: Home / Self Care | Payer: Medicare HMO | Source: Ambulatory Visit | Attending: Family Medicine | Admitting: Family Medicine

## 2022-03-13 DIAGNOSIS — R918 Other nonspecific abnormal finding of lung field: Secondary | ICD-10-CM | POA: Diagnosis not present

## 2022-03-13 DIAGNOSIS — Z8701 Personal history of pneumonia (recurrent): Secondary | ICD-10-CM | POA: Diagnosis not present

## 2022-03-13 DIAGNOSIS — J189 Pneumonia, unspecified organism: Secondary | ICD-10-CM

## 2022-03-13 DIAGNOSIS — J984 Other disorders of lung: Secondary | ICD-10-CM | POA: Diagnosis not present

## 2022-03-15 ENCOUNTER — Other Ambulatory Visit: Payer: Self-pay | Admitting: Cardiovascular Disease

## 2022-03-16 DIAGNOSIS — E1165 Type 2 diabetes mellitus with hyperglycemia: Secondary | ICD-10-CM | POA: Diagnosis not present

## 2022-03-16 NOTE — Telephone Encounter (Signed)
Refill request

## 2022-03-27 DIAGNOSIS — M25561 Pain in right knee: Secondary | ICD-10-CM | POA: Diagnosis not present

## 2022-03-27 DIAGNOSIS — M25562 Pain in left knee: Secondary | ICD-10-CM | POA: Diagnosis not present

## 2022-03-28 DIAGNOSIS — Z6841 Body Mass Index (BMI) 40.0 and over, adult: Secondary | ICD-10-CM | POA: Diagnosis not present

## 2022-03-28 DIAGNOSIS — E1165 Type 2 diabetes mellitus with hyperglycemia: Secondary | ICD-10-CM | POA: Diagnosis not present

## 2022-03-30 ENCOUNTER — Other Ambulatory Visit: Payer: Self-pay | Admitting: Family Medicine

## 2022-03-30 DIAGNOSIS — J849 Interstitial pulmonary disease, unspecified: Secondary | ICD-10-CM

## 2022-04-27 ENCOUNTER — Ambulatory Visit
Admission: RE | Admit: 2022-04-27 | Discharge: 2022-04-27 | Disposition: A | Discharge status: Home / Self Care | Payer: Medicare HMO | Source: Ambulatory Visit | Attending: Family Medicine | Admitting: Family Medicine

## 2022-04-27 DIAGNOSIS — J849 Interstitial pulmonary disease, unspecified: Secondary | ICD-10-CM

## 2022-04-27 DIAGNOSIS — J479 Bronchiectasis, uncomplicated: Secondary | ICD-10-CM | POA: Diagnosis not present

## 2022-04-27 DIAGNOSIS — J8410 Pulmonary fibrosis, unspecified: Secondary | ICD-10-CM | POA: Diagnosis not present

## 2022-04-27 DIAGNOSIS — J439 Emphysema, unspecified: Secondary | ICD-10-CM | POA: Diagnosis not present

## 2022-04-27 DIAGNOSIS — I700 Atherosclerosis of aorta: Secondary | ICD-10-CM | POA: Diagnosis not present

## 2022-04-27 MED ORDER — IOPAMIDOL (ISOVUE-300) INJECTION 61%
75.0000 mL | Freq: Once | INTRAVENOUS | Status: AC | PRN
  Administered 2022-04-27: 75 mL via INTRAVENOUS

## 2022-05-17 ENCOUNTER — Other Ambulatory Visit: Payer: Medicare HMO

## 2022-05-22 ENCOUNTER — Other Ambulatory Visit: Payer: Self-pay | Admitting: Cardiovascular Disease

## 2022-05-22 DIAGNOSIS — K7469 Other cirrhosis of liver: Secondary | ICD-10-CM | POA: Diagnosis not present

## 2022-05-22 DIAGNOSIS — Z789 Other specified health status: Secondary | ICD-10-CM | POA: Diagnosis not present

## 2022-05-23 ENCOUNTER — Other Ambulatory Visit: Payer: Self-pay | Admitting: Nurse Practitioner

## 2022-05-23 DIAGNOSIS — R935 Abnormal findings on diagnostic imaging of other abdominal regions, including retroperitoneum: Secondary | ICD-10-CM

## 2022-05-23 DIAGNOSIS — K7469 Other cirrhosis of liver: Secondary | ICD-10-CM

## 2022-05-30 DIAGNOSIS — E039 Hypothyroidism, unspecified: Secondary | ICD-10-CM | POA: Diagnosis not present

## 2022-05-30 DIAGNOSIS — I10 Essential (primary) hypertension: Secondary | ICD-10-CM | POA: Diagnosis not present

## 2022-05-30 DIAGNOSIS — N182 Chronic kidney disease, stage 2 (mild): Secondary | ICD-10-CM | POA: Diagnosis not present

## 2022-05-30 DIAGNOSIS — E1121 Type 2 diabetes mellitus with diabetic nephropathy: Secondary | ICD-10-CM | POA: Diagnosis not present

## 2022-05-30 DIAGNOSIS — E7800 Pure hypercholesterolemia, unspecified: Secondary | ICD-10-CM | POA: Diagnosis not present

## 2022-06-01 DIAGNOSIS — E1165 Type 2 diabetes mellitus with hyperglycemia: Secondary | ICD-10-CM | POA: Diagnosis not present

## 2022-06-07 DIAGNOSIS — N3000 Acute cystitis without hematuria: Secondary | ICD-10-CM | POA: Diagnosis not present

## 2022-06-11 ENCOUNTER — Ambulatory Visit
Admission: RE | Admit: 2022-06-11 | Discharge: 2022-06-11 | Disposition: A | Discharge status: Home / Self Care | Payer: Medicare HMO | Source: Ambulatory Visit | Attending: Nurse Practitioner | Admitting: Nurse Practitioner

## 2022-06-11 DIAGNOSIS — K7460 Unspecified cirrhosis of liver: Secondary | ICD-10-CM | POA: Diagnosis not present

## 2022-06-11 DIAGNOSIS — K7469 Other cirrhosis of liver: Secondary | ICD-10-CM

## 2022-06-11 DIAGNOSIS — R935 Abnormal findings on diagnostic imaging of other abdominal regions, including retroperitoneum: Secondary | ICD-10-CM

## 2022-06-14 DIAGNOSIS — E1165 Type 2 diabetes mellitus with hyperglycemia: Secondary | ICD-10-CM | POA: Diagnosis not present

## 2022-06-25 ENCOUNTER — Telehealth: Payer: Self-pay | Admitting: Cardiovascular Disease

## 2022-06-25 NOTE — Telephone Encounter (Signed)
Left a message for the patient to call back.  

## 2022-06-25 NOTE — Telephone Encounter (Signed)
Pt returning nurse's call. Please advise

## 2022-06-25 NOTE — Telephone Encounter (Signed)
Returned the call to the patient. She stated that she has been having increased, bilateral leg pain when ambulating for the past two days. The pain in is both legs when she ambulates and eases when she is at rest.   She denies discoloration, temperature change and numbness.   She is leaving for a two month vacation on 5/30. Appointment made with Azalee Course, PA on 5/22.

## 2022-06-25 NOTE — Telephone Encounter (Signed)
New Message:    Patient says she would like to be seen asap by Dr Kirke Corin or his APP. She says her legs are hurting so bad, the veinss are popping out every where.

## 2022-06-25 NOTE — Telephone Encounter (Signed)
Appointment changed to 5/21 with Dr. Kirke Corin due to an opening. Patient aware.
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
## 2022-06-26 ENCOUNTER — Ambulatory Visit: Payer: Medicare HMO | Attending: Physician Assistant | Admitting: Cardiovascular Disease

## 2022-06-26 ENCOUNTER — Other Ambulatory Visit: Payer: Self-pay | Admitting: Cardiovascular Disease

## 2022-06-26 VITALS — BP 112/70 | HR 73 | Ht 65.0 in | Wt 249.4 lb

## 2022-06-26 DIAGNOSIS — E785 Hyperlipidemia, unspecified: Secondary | ICD-10-CM

## 2022-06-26 DIAGNOSIS — I701 Atherosclerosis of renal artery: Secondary | ICD-10-CM

## 2022-06-26 DIAGNOSIS — I10 Essential (primary) hypertension: Secondary | ICD-10-CM | POA: Diagnosis not present

## 2022-06-26 DIAGNOSIS — I739 Peripheral vascular disease, unspecified: Secondary | ICD-10-CM

## 2022-06-26 DIAGNOSIS — I2510 Atherosclerotic heart disease of native coronary artery without angina pectoris: Secondary | ICD-10-CM | POA: Diagnosis not present

## 2022-06-26 DIAGNOSIS — I779 Disorder of arteries and arterioles, unspecified: Secondary | ICD-10-CM | POA: Diagnosis not present

## 2022-06-26 NOTE — Patient Instructions (Signed)
Medication Instructions:  No changes *If you need a refill on your cardiac medications before your next appointment, please call your pharmacy*   Lab Work: None ordered If you have labs (blood work) drawn today and your tests are completely normal, you will receive your results only by: MyChart Message (if you have MyChart) OR A paper copy in the mail If you have any lab test that is abnormal or we need to change your treatment, we will call you to review the results.   Testing/Procedures: None ordered   Follow-Up: At [facility], you and your health needs are our priority.  As part of our continuing mission to provide you with exceptional heart care, we have created designated Provider Care Teams.  These Care Teams include your primary Cardiologist (physician) and Advanced Practice Providers (APPs -  Physician Assistants and Nurse Practitioners) who all work together to provide you with the care you need, when you need it.  We recommend signing up for the patient portal called "MyChart".  Sign up information is provided on this After Visit Summary.  MyChart is used to connect with patients for Virtual Visits (Telemedicine).  Patients are able to view lab/test results, encounter notes, upcoming appointments, etc.  Non-urgent messages can be sent to your provider as well.   To learn more about what you can do with MyChart, go to [email or web address].    Your next appointment:   6 month(s)  Provider:   Dr. Arida  Other Instructions EXERCISE PROGRAM FOR INDIVIDUALS WITH  PERIPHERAL ARTERIAL DISEASE (PAD)   General Information:   Research in vascular exercise has demonstrated remarkable improvement in symptoms of leg pain (claudication) without expensive or invasive interventions. Regular walking programs are extremely helpful for patients with PAD and intermittent claudication.  These steps are designed to help you get started with a safe and effective program to help  you walk farther with less pain:   Walk at least three times a week (preferably every day).  Your goal is to build up to 30-45 minutes of total walking time (not counting rest breaks). It may take you several weeks to build up your exercise time starting at 5-10 minutes or whatever you can tolerate.  Walk as far as possible using moderate to maximal pain (7-8 on the scale below) as a signal to stop, and resume walking when the pain goes away.  On a treadmill, set the speed and grade at a level that brings on the claudication pain within 3 to 5 minutes. Walk at this rate until you experience claudication of moderate severity, rest until the pain improves, and then resume walking.  Over time, you will be able to walk longer at the designated speed and grade; workload should then be increased until you develop the pain within 3 to 5 minutes once again.  This regimen will induce a significant benefit. Studies have demonstrated that participants may be able to walk up to three or four times farther and have less leg pain, within twelve weeks, by following this protocol.  Pain Scale    0_____1_____2_____3_____4_____5_____6_____7_____8_____9_____10   No Pain                                   Moderate Pain                               Maximal   Pain   

## 2022-06-27 ENCOUNTER — Ambulatory Visit: Payer: Medicare HMO | Admitting: Physician Assistant

## 2022-06-27 DIAGNOSIS — I700 Atherosclerosis of aorta: Secondary | ICD-10-CM | POA: Diagnosis not present

## 2022-06-27 DIAGNOSIS — F325 Major depressive disorder, single episode, in full remission: Secondary | ICD-10-CM | POA: Diagnosis not present

## 2022-06-27 DIAGNOSIS — K7460 Unspecified cirrhosis of liver: Secondary | ICD-10-CM | POA: Diagnosis not present

## 2022-06-27 DIAGNOSIS — I779 Disorder of arteries and arterioles, unspecified: Secondary | ICD-10-CM | POA: Diagnosis not present

## 2022-06-27 DIAGNOSIS — I701 Atherosclerosis of renal artery: Secondary | ICD-10-CM | POA: Diagnosis not present

## 2022-06-27 DIAGNOSIS — J849 Interstitial pulmonary disease, unspecified: Secondary | ICD-10-CM | POA: Diagnosis not present

## 2022-06-27 DIAGNOSIS — E1140 Type 2 diabetes mellitus with diabetic neuropathy, unspecified: Secondary | ICD-10-CM | POA: Diagnosis not present

## 2022-06-27 DIAGNOSIS — E1151 Type 2 diabetes mellitus with diabetic peripheral angiopathy without gangrene: Secondary | ICD-10-CM | POA: Diagnosis not present

## 2022-07-04 DIAGNOSIS — R059 Cough, unspecified: Secondary | ICD-10-CM | POA: Diagnosis not present

## 2022-07-04 DIAGNOSIS — M549 Dorsalgia, unspecified: Secondary | ICD-10-CM | POA: Diagnosis not present

## 2022-07-04 DIAGNOSIS — J988 Other specified respiratory disorders: Secondary | ICD-10-CM | POA: Diagnosis not present

## 2022-07-13 DIAGNOSIS — Z794 Long term (current) use of insulin: Secondary | ICD-10-CM | POA: Diagnosis not present

## 2022-07-13 DIAGNOSIS — Z87891 Personal history of nicotine dependence: Secondary | ICD-10-CM | POA: Diagnosis not present

## 2022-07-13 DIAGNOSIS — E119 Type 2 diabetes mellitus without complications: Secondary | ICD-10-CM | POA: Diagnosis not present

## 2022-07-13 DIAGNOSIS — L03032 Cellulitis of left toe: Secondary | ICD-10-CM | POA: Diagnosis not present

## 2022-07-29 DIAGNOSIS — S46812A Strain of other muscles, fascia and tendons at shoulder and upper arm level, left arm, initial encounter: Secondary | ICD-10-CM | POA: Diagnosis not present

## 2022-07-29 DIAGNOSIS — M79602 Pain in left arm: Secondary | ICD-10-CM | POA: Diagnosis not present

## 2022-09-11 DIAGNOSIS — M79642 Pain in left hand: Secondary | ICD-10-CM | POA: Diagnosis not present

## 2022-09-11 DIAGNOSIS — M79641 Pain in right hand: Secondary | ICD-10-CM | POA: Diagnosis not present

## 2022-09-12 DIAGNOSIS — E1165 Type 2 diabetes mellitus with hyperglycemia: Secondary | ICD-10-CM | POA: Diagnosis not present

## 2022-09-18 ENCOUNTER — Other Ambulatory Visit: Payer: Self-pay | Admitting: Nurse Practitioner

## 2022-09-18 DIAGNOSIS — M25562 Pain in left knee: Secondary | ICD-10-CM | POA: Diagnosis not present

## 2022-09-18 DIAGNOSIS — M25561 Pain in right knee: Secondary | ICD-10-CM | POA: Diagnosis not present

## 2022-09-21 DIAGNOSIS — E1121 Type 2 diabetes mellitus with diabetic nephropathy: Secondary | ICD-10-CM | POA: Diagnosis not present

## 2022-09-21 DIAGNOSIS — E1165 Type 2 diabetes mellitus with hyperglycemia: Secondary | ICD-10-CM | POA: Diagnosis not present

## 2022-09-21 DIAGNOSIS — E039 Hypothyroidism, unspecified: Secondary | ICD-10-CM | POA: Diagnosis not present

## 2022-09-21 DIAGNOSIS — Z79899 Other long term (current) drug therapy: Secondary | ICD-10-CM | POA: Diagnosis not present

## 2022-09-21 DIAGNOSIS — Z0000 Encounter for general adult medical examination without abnormal findings: Secondary | ICD-10-CM | POA: Diagnosis not present

## 2022-09-21 DIAGNOSIS — Z9181 History of falling: Secondary | ICD-10-CM | POA: Diagnosis not present

## 2022-09-21 DIAGNOSIS — E559 Vitamin D deficiency, unspecified: Secondary | ICD-10-CM | POA: Diagnosis not present

## 2022-09-21 DIAGNOSIS — I2510 Atherosclerotic heart disease of native coronary artery without angina pectoris: Secondary | ICD-10-CM | POA: Diagnosis not present

## 2022-09-21 DIAGNOSIS — E7800 Pure hypercholesterolemia, unspecified: Secondary | ICD-10-CM | POA: Diagnosis not present

## 2022-09-25 DIAGNOSIS — R350 Frequency of micturition: Secondary | ICD-10-CM | POA: Diagnosis not present

## 2022-09-26 DIAGNOSIS — Z1211 Encounter for screening for malignant neoplasm of colon: Secondary | ICD-10-CM | POA: Diagnosis not present

## 2022-10-02 DIAGNOSIS — H25813 Combined forms of age-related cataract, bilateral: Secondary | ICD-10-CM | POA: Diagnosis not present

## 2022-10-02 DIAGNOSIS — M79642 Pain in left hand: Secondary | ICD-10-CM | POA: Diagnosis not present

## 2022-10-02 DIAGNOSIS — H524 Presbyopia: Secondary | ICD-10-CM | POA: Diagnosis not present

## 2022-10-02 DIAGNOSIS — M25532 Pain in left wrist: Secondary | ICD-10-CM | POA: Diagnosis not present

## 2022-10-02 DIAGNOSIS — M79641 Pain in right hand: Secondary | ICD-10-CM | POA: Diagnosis not present

## 2022-10-18 DIAGNOSIS — M25532 Pain in left wrist: Secondary | ICD-10-CM | POA: Diagnosis not present

## 2022-10-25 DIAGNOSIS — M25532 Pain in left wrist: Secondary | ICD-10-CM | POA: Diagnosis not present

## 2022-11-01 DIAGNOSIS — H9203 Otalgia, bilateral: Secondary | ICD-10-CM | POA: Diagnosis not present

## 2022-11-01 DIAGNOSIS — R399 Unspecified symptoms and signs involving the genitourinary system: Secondary | ICD-10-CM | POA: Diagnosis not present

## 2022-11-13 ENCOUNTER — Other Ambulatory Visit: Payer: Self-pay | Admitting: Nurse Practitioner

## 2022-11-13 DIAGNOSIS — K7469 Other cirrhosis of liver: Secondary | ICD-10-CM

## 2022-11-13 DIAGNOSIS — K7581 Nonalcoholic steatohepatitis (NASH): Secondary | ICD-10-CM | POA: Diagnosis not present

## 2022-11-19 DIAGNOSIS — H25811 Combined forms of age-related cataract, right eye: Secondary | ICD-10-CM | POA: Diagnosis not present

## 2022-11-19 DIAGNOSIS — H43823 Vitreomacular adhesion, bilateral: Secondary | ICD-10-CM | POA: Diagnosis not present

## 2022-11-26 ENCOUNTER — Other Ambulatory Visit: Payer: Medicare HMO

## 2022-11-26 ENCOUNTER — Ambulatory Visit
Admission: RE | Admit: 2022-11-26 | Discharge: 2022-11-26 | Disposition: A | Discharge status: Home / Self Care | Payer: Medicare HMO | Source: Ambulatory Visit | Attending: Nurse Practitioner | Admitting: Nurse Practitioner

## 2022-11-26 DIAGNOSIS — K7469 Other cirrhosis of liver: Secondary | ICD-10-CM

## 2022-11-26 DIAGNOSIS — K7460 Unspecified cirrhosis of liver: Secondary | ICD-10-CM | POA: Diagnosis not present

## 2022-12-07 ENCOUNTER — Other Ambulatory Visit: Payer: Self-pay | Admitting: Cardiovascular Disease

## 2022-12-11 DIAGNOSIS — E1165 Type 2 diabetes mellitus with hyperglycemia: Secondary | ICD-10-CM | POA: Diagnosis not present

## 2022-12-14 DIAGNOSIS — H25811 Combined forms of age-related cataract, right eye: Secondary | ICD-10-CM | POA: Diagnosis not present

## 2022-12-15 DIAGNOSIS — H25813 Combined forms of age-related cataract, bilateral: Secondary | ICD-10-CM | POA: Diagnosis not present

## 2022-12-18 ENCOUNTER — Other Ambulatory Visit: Payer: Self-pay | Admitting: Cardiovascular Disease

## 2022-12-19 ENCOUNTER — Other Ambulatory Visit: Payer: Self-pay | Admitting: Cardiovascular Disease

## 2023-01-14 DIAGNOSIS — I2510 Atherosclerotic heart disease of native coronary artery without angina pectoris: Secondary | ICD-10-CM | POA: Diagnosis not present

## 2023-01-14 DIAGNOSIS — I700 Atherosclerosis of aorta: Secondary | ICD-10-CM | POA: Diagnosis not present

## 2023-01-14 DIAGNOSIS — I779 Disorder of arteries and arterioles, unspecified: Secondary | ICD-10-CM | POA: Diagnosis not present

## 2023-01-14 DIAGNOSIS — E1151 Type 2 diabetes mellitus with diabetic peripheral angiopathy without gangrene: Secondary | ICD-10-CM | POA: Diagnosis not present

## 2023-01-14 DIAGNOSIS — R0789 Other chest pain: Secondary | ICD-10-CM | POA: Diagnosis not present

## 2023-01-14 DIAGNOSIS — N3000 Acute cystitis without hematuria: Secondary | ICD-10-CM | POA: Diagnosis not present

## 2023-01-14 DIAGNOSIS — W19XXXA Unspecified fall, initial encounter: Secondary | ICD-10-CM | POA: Diagnosis not present

## 2023-01-14 DIAGNOSIS — Z23 Encounter for immunization: Secondary | ICD-10-CM | POA: Diagnosis not present

## 2023-01-15 ENCOUNTER — Ambulatory Visit
Admission: RE | Admit: 2023-01-15 | Discharge: 2023-01-15 | Disposition: A | Discharge status: Home / Self Care | Payer: Medicare HMO | Source: Ambulatory Visit | Attending: Family Medicine | Admitting: Family Medicine

## 2023-01-15 ENCOUNTER — Other Ambulatory Visit: Payer: Self-pay | Admitting: Family Medicine

## 2023-01-15 DIAGNOSIS — I700 Atherosclerosis of aorta: Secondary | ICD-10-CM | POA: Diagnosis not present

## 2023-01-15 DIAGNOSIS — R0781 Pleurodynia: Secondary | ICD-10-CM | POA: Diagnosis not present

## 2023-01-15 DIAGNOSIS — N390 Urinary tract infection, site not specified: Secondary | ICD-10-CM | POA: Diagnosis not present

## 2023-01-15 DIAGNOSIS — N3000 Acute cystitis without hematuria: Secondary | ICD-10-CM | POA: Diagnosis not present

## 2023-01-15 DIAGNOSIS — J439 Emphysema, unspecified: Secondary | ICD-10-CM | POA: Diagnosis not present

## 2023-01-15 DIAGNOSIS — R52 Pain, unspecified: Secondary | ICD-10-CM

## 2023-01-25 DIAGNOSIS — N390 Urinary tract infection, site not specified: Secondary | ICD-10-CM | POA: Diagnosis not present

## 2023-01-29 DIAGNOSIS — E538 Deficiency of other specified B group vitamins: Secondary | ICD-10-CM | POA: Diagnosis not present

## 2023-01-29 DIAGNOSIS — N3000 Acute cystitis without hematuria: Secondary | ICD-10-CM | POA: Diagnosis not present

## 2023-01-29 DIAGNOSIS — E1121 Type 2 diabetes mellitus with diabetic nephropathy: Secondary | ICD-10-CM | POA: Diagnosis not present

## 2023-01-29 DIAGNOSIS — Z1624 Resistance to multiple antibiotics: Secondary | ICD-10-CM | POA: Diagnosis not present

## 2023-01-29 DIAGNOSIS — N390 Urinary tract infection, site not specified: Secondary | ICD-10-CM | POA: Diagnosis not present

## 2023-01-29 DIAGNOSIS — I2510 Atherosclerotic heart disease of native coronary artery without angina pectoris: Secondary | ICD-10-CM | POA: Diagnosis not present

## 2023-02-14 ENCOUNTER — Encounter: Payer: Self-pay | Admitting: Internal Medicine

## 2023-02-14 ENCOUNTER — Other Ambulatory Visit: Payer: Self-pay | Admitting: Internal Medicine

## 2023-02-14 ENCOUNTER — Ambulatory Visit (INDEPENDENT_AMBULATORY_CARE_PROVIDER_SITE_OTHER): Payer: Medicare HMO | Admitting: Internal Medicine

## 2023-02-14 ENCOUNTER — Other Ambulatory Visit (HOSPITAL_COMMUNITY): Payer: Self-pay

## 2023-02-14 ENCOUNTER — Other Ambulatory Visit: Payer: Self-pay

## 2023-02-14 VITALS — BP 113/70 | HR 80 | Temp 97.5°F | Wt 227.0 lb

## 2023-02-14 DIAGNOSIS — N390 Urinary tract infection, site not specified: Secondary | ICD-10-CM

## 2023-02-14 MED ORDER — FOSFOMYCIN TROMETHAMINE 3 G PO PACK: PACK | ORAL | 0 refills | Status: DC

## 2023-02-14 NOTE — Telephone Encounter (Signed)
 Called [facility] regarding medication. Without insurance out of pocket cost would be $185.  Juanita Laster, RMA

## 2023-02-14 NOTE — Progress Notes (Addendum)
 Patient Active Problem List   Diagnosis Date Noted   Chronic cough 06/07/2021   Mediastinal adenopathy 05/08/2021   Migraine 06/23/2020   RUQ pain 06/20/2020   Generalized abdominal pain    ILD (interstitial lung disease) (HCC) 11/04/2019   Pulmonary nodules/lesions, multiple 06/05/2019   Common migraine with intractable migraine 06/04/2019   Spondylolisthesis, lumbar region 01/03/2017   Pain in right leg 01/03/2017   Hyperglycemia 04/09/2014   Diabetic hyperosmolar non-ketotic state (HCC) 04/09/2014   Hypothyroidism 04/09/2014   Hypertension 04/09/2014   PVD (peripheral vascular disease) (HCC) 11/02/2013   Carotid stenosis 01/08/2013   Left carotid bruit 12/15/2012   Hypertensive urgency 12/02/2012   TIA (transient ischemic attack) 12/02/2012   Headache 12/02/2012   Hypokalemia 02/10/2012   Obesity (BMI 30-39.9) 02/09/2012   AKI (acute kidney injury) (HCC) 02/09/2012   Dehydration 02/09/2012   SBO (small bowel obstruction) (HCC) 02/08/2012   Pleuritic chest pain 12/14/2011   SOB (shortness of breath) 12/14/2011   Hypotension 12/14/2011   ARF (acute renal failure) (HCC) 12/14/2011   Diabetes (HCC) 12/14/2011   Hyperlipidemia 12/14/2011   CAD (coronary artery disease) 12/14/2011   CAP (community acquired pneumonia) 12/14/2011    Patient's Medications  New Prescriptions   No medications on file  Previous Medications   ACCU-CHEK GUIDE TEST STRIP       ACCU-CHEK SOFTCLIX LANCETS LANCETS       ALBUTEROL  (PROVENTIL ) (2.5 MG/3ML) 0.083% NEBULIZER SOLUTION    Take 3 mLs (2.5 mg total) by nebulization every 6 (six) hours as needed for wheezing or shortness of breath.   AMLODIPINE  (NORVASC ) 10 MG TABLET    Take 10 mg by mouth in the morning.   ASPIRIN  EC 81 MG TABLET    Take 1 tablet (81 mg total) by mouth daily. Swallow whole.   ATENOLOL  (TENORMIN ) 100 MG TABLET    Take 1 tablet (100 mg total) by mouth daily.   B-D UF III MINI PEN NEEDLES 31G X 5 MM MISC        BLOOD GLUCOSE MONITORING SUPPL (ACCU-CHEK GUIDE) W/DEVICE KIT       CHOLECALCIFEROL  (VITAMIN D3) 125 MCG (5000 UT) TABS    Take 5,000 Units by mouth in the morning.   CILOSTAZOL  (PLETAL ) 100 MG TABLET    TAKE 1 TABLET BY MOUTH TWICE A DAY   DICYCLOMINE  (BENTYL ) 10 MG CAPSULE    Take 10 mg by mouth 3 (three) times daily before meals.   EVOLOCUMAB  (REPATHA  SURECLICK) 140 MG/ML SOAJ    INJECT 1 PEN INTO THE SKIN EVERY 14 (FOURTEEN) DAYS.   INSULIN  ASPART PROTAMINE  - ASPART (NOVOLOG  70/30 MIX) (70-30) 100 UNIT/ML FLEXPEN    Inject 40 Units into the skin in the morning and at bedtime.   LEVOTHYROXINE  (SYNTHROID ) 100 MCG TABLET    Take 100 mcg by mouth daily before breakfast.   NUTRITIONAL SUPPLEMENTS (FRUIT & VEGETABLE DAILY PO)    Take 6 capsules by mouth daily with lunch. Balance of Nature Fruit and Vegetable Supplements (3 Fruit + 3 Vegetable)   ONDANSETRON  (ZOFRAN ) 4 MG TABLET    Take 1 tablet (4 mg total) by mouth every 8 (eight) hours as needed for nausea or vomiting.   PANTOPRAZOLE  (PROTONIX ) 40 MG TABLET    Take 1 tablet (40 mg total) by mouth daily.   PROAIR  HFA 108 (90 BASE) MCG/ACT INHALER    Inhale 2-3 puffs into the lungs every 6 (six) hours as needed  for shortness of breath.   ROSUVASTATIN  (CRESTOR ) 40 MG TABLET    Take 1 tablet (40 mg total) by mouth daily.   SEMAGLUTIDE, 1 MG/DOSE, (OZEMPIC, 1 MG/DOSE,) 2 MG/1.5ML SOPN    Inject 1 mg into the skin every Saturday.   SPIRONOLACTONE  (ALDACTONE ) 25 MG TABLET    TAKE 1 TABLET BY MOUTH EVERY DAY   ZOLPIDEM  (AMBIEN  CR) 12.5 MG CR TABLET    Take 12.5 mg by mouth at bedtime as needed for sleep.  Modified Medications   No medications on file  Discontinued Medications   No medications on file    Subjective: 63 YF presents for management of recurrent UTI.  Patient was referred by [facility] physicians, Dr. Reena Wolters(PCP)  She was last seen on 12/24 and had burning urinating and was  Rx cefadroxil x 7 days.  She has had multiple course of  antibiotics in the past 12 months fro UTI. Antibiotic Hx per record review:  December she was on Keflex. September-Keflex Interim-Levaquin  and Cipro  March Levaquin  and Z-Pak for pneumonia January-cefuroxime She also history of diabetes hypertension hyperlipidemia hypercholesteremia - Urine culture review:  12/24 E. coli, ESBL resistant to ampicillin, cefazolin, cefepime, ceftriaxone , cefuroxime, Bactrim, ciprofloxacin , Levaquin  sensitive to carbapenem and macrobid , augmentin,  Intermediate to cefuroxime - 12/13 urine cultures grew Proteus mirabilis resistant to ampicillin Cipro  Macrobid  Bactrim and tetracycline - 11/05/2022 Staph epidermidis - 8/23 mixed urogenital flora - 06/11/2022 E. coli pansensitive  01/16/2022 E. coli resistant to Bactrim Today 02/13/22: Doing well.  Only symotoms is urine smells, burns when right before go tot bathroom for a few months. Never seen urology. No hospitalization for UTI. No ssytemic symtoms fo fevers or chills. Burnign not effected by abx.    Review of Systems: Review of Systems  All other systems reviewed and are negative.   Past Medical History:  Diagnosis Date   Acid reflux    Anxiety    ARF (acute renal failure) (HCC) 12/14/2011   Arthritis    Asthma    CAD (coronary artery disease)    a. Cath 2015 - moderate LAD, diagonal, and OM disease with 100% dRCA with L-R collaterals. b. 2019 nuc normal.   Carotid artery occlusion    Chronic pain following surgery or procedure 2008   Abdominal pain   Common migraine with intractable migraine 06/04/2019   Dehydration 02/09/2012   Diabetes mellitus    Type II   Diabetic hyperosmolar non-ketotic state (HCC) 04/09/2014   Headache(784.0)    Hepatic steatosis    noted on CT 04/2019   Hyperlipidemia    Hypertension    stress test- scheduled for 12/30/2012   Hypothyroidism    PAD (peripheral artery disease) (HCC)    a. presumed by noninvasive testing 2021.   Pneumonia    Prolonged QT interval     Pulmonary nodules/lesions, multiple 06/05/2019   SBO (small bowel obstruction) (HCC) 02/08/2012   Sleep apnea    Uses a Cpap   Stroke (HCC) 11/2012   tingling in L arm , slurred speech- came to ER    Social History   Tobacco Use   Smoking status: Former    Current packs/day: 0.00    Average packs/day: 0.3 packs/day for 29.0 years (7.3 ttl pk-yrs)    Types: Cigarettes    Start date: 02/06/1972    Quit date: 02/05/2001    Years since quitting: 22.0   Smokeless tobacco: Never  Vaping Use   Vaping status: Never Used  Substance Use Topics  Alcohol  use: Not Currently    Alcohol /week: 2.0 standard drinks of alcohol     Types: 2 Cans of beer per week    Comment: rarely   Drug use: No    Family History  Problem Relation Age of Onset   CAD Brother         X 2   Diabetes Brother    Heart disease Brother        before age 1   Hyperlipidemia Brother    Hypertension Brother    Heart attack Brother    CAD Sister         X 1   Cancer Sister    Diabetes Sister    Heart disease Sister        before age 21   Hyperlipidemia Sister    Hypertension Sister    Hypertension Other        ALL   Diabetes Mother    Heart disease Mother    Hyperlipidemia Mother    Hypertension Mother    Heart disease Father    Hyperlipidemia Father    Hypertension Father    Hypertension Daughter    Asthma Other    Stroke Other     Allergies  Allergen Reactions   Strawberry Extract Anaphylaxis   Cefdinir Other (See Comments)   Oxycodone  Hcl Nausea And Vomiting   Sulfamethoxazole-Trimethoprim Rash   Topiramate  Other (See Comments)    GI Upset, Dizzy, HA, Blurred Vision   Cymbalta [Duloxetine Hcl] Other (See Comments)    Dizzy    Lisinopril  Itching, Swelling and Other (See Comments)    Angioedema    Lunesta [Eszopiclone] Other (See Comments)    Bad taste in mouth    Metformin  And Related Other (See Comments)    Damaged patient's kidneys   Other Other (See Comments)    Pt is a Jehovah  Witness. No blood products.   Sulfa Antibiotics Itching, Swelling and Other (See Comments)    Angioedema    Tramadol Other (See Comments)    Hallucinations    Trazodone And Nefazodone Other (See Comments)    Shakes    Penicillins Rash and Other (See Comments)    Paralyzed (per patient) Has patient had a PCN reaction causing immediate rash, facial/tongue/throat swelling, SOB or lightheadedness with hypotension: Yes Has patient had a PCN reaction causing severe rash involving mucus membranes or skin necrosis: No Has patient had a PCN reaction that required hospitalization Yes Has patient had a PCN reaction occurring within the last 10 years: No If all of the above answers are NO, then may proceed with Cephalosporin use.     Health Maintenance  Topic Date Due   FOOT EXAM  Never done   OPHTHALMOLOGY EXAM  Never done   Diabetic kidney evaluation - Urine ACR  Never done   Hepatitis C Screening  Never done   Zoster Vaccines- Shingrix (1 of 2) 07/09/1973   Colonoscopy  Never done   Pneumonia Vaccine 45+ Years old (2 of 2 - PCV) 10/26/2011   HEMOGLOBIN A1C  03/16/2021   INFLUENZA VACCINE  09/06/2022   COVID-19 Vaccine (4 - 2024-25 season) 10/07/2022   Diabetic kidney evaluation - eGFR measurement  01/17/2023   MAMMOGRAM  02/04/2023   Medicare Annual Wellness (AWV)  09/21/2023   DTaP/Tdap/Td (3 - Td or Tdap) 07/14/2029   DEXA SCAN  Completed   HPV VACCINES  Aged [employer] Information  Document Information  Photos    02/14/2023 10:14  Attached To:  Office Visit on 02/14/23 with Dennise Kingsley, MD  Source Information  Dennise Kingsley, MD  [facility]  Document History     Objective:  Vitals:   02/14/23 1003  BP: 113/70  Pulse: 80  Temp: (!) 97.5 F (36.4 C)  TempSrc: Oral  SpO2: 98%  Weight: 227 lb (103 kg)   Body mass index is 37.77 kg/m.  Physical Exam Constitutional:      Appearance: Normal appearance.  HENT:     Head: Normocephalic and  atraumatic.     Right Ear: Tympanic membrane normal.     Left Ear: Tympanic membrane normal.     Nose: Nose normal.     Mouth/Throat:     Mouth: Mucous membranes are moist.  Eyes:     Extraocular Movements: Extraocular movements intact.     Conjunctiva/sclera: Conjunctivae normal.     Pupils: Pupils are equal, round, and reactive to light.  Cardiovascular:     Rate and Rhythm: Normal rate and regular rhythm.     Heart sounds: No murmur heard.    No friction rub. No gallop.  Pulmonary:     Effort: Pulmonary effort is normal.     Breath sounds: Normal breath sounds.  Abdominal:     General: Abdomen is flat.     Palpations: Abdomen is soft.  Musculoskeletal:        General: Normal range of motion.  Skin:    General: Skin is warm and dry.  Neurological:     General: No focal deficit present.     Mental Status: She is alert and oriented to person, place, and time.  Psychiatric:        Mood and Affect: Mood normal.    Physical Exam          Lab Results Lab Results  Component Value Date   WBC 5.5 01/16/2022   HGB 14.9 01/16/2022   HCT 47.0 (H) 01/16/2022   MCV 85 01/16/2022   PLT 262 01/16/2022    Lab Results  Component Value Date   CREATININE 0.88 01/16/2022   BUN 7 (L) 01/16/2022   NA 140 01/16/2022   K 3.8 01/16/2022   CL 102 01/16/2022   CO2 22 01/16/2022    Lab Results  Component Value Date   ALT 21 06/20/2020   AST 27 06/20/2020   ALKPHOS 121 06/20/2020   BILITOT 0.9 06/20/2020    Lab Results  Component Value Date   CHOL 125 06/21/2020   HDL 47 06/21/2020   LDLCALC 61 06/21/2020   TRIG 83 06/21/2020   CHOLHDL 2.7 06/21/2020   No results found for: LABRPR, RPRTITER No results found for: HIV1RNAQUANT, HIV1RNAVL, CD4TABS   Problem List Items Addressed This Visit   None  Results          Assessment/Plan #Hx of recurrent UTI #Urine Cx+ ESBL Ecoli in December S macrobid , augmentin #Penicillin allergy listed, tolerated Augmentin per  record review -Pt states she has multiple UTIs over the years. Her symptoms generally include maloderous urine. She states she has dysuria x 2 months. Multiple round of abx, She denies fever, chills, hospitalization for UTI. Has not seen by Urology. -I dicussed that color or odor of urine does equate to a UTI. Reviewed symptoms of UTI as abdominal pain, dysuria, increase urinary frequency+/1 fever chills,  -Referred to Urology for anatomic work/up -The latest urine Cx from December is growing Esbl ecoli ,she has completed cefadroxil x 7 days(which ecoli is  resistant to).  Given that patient's dysuria has persisted without fevers or chills, I suspect there is underlying urologic issue rather than infection.  Regardless will prescribe for fosfomycin x 2 dose for ease of dosingand esbl coverage.  If patient is unable to obtain fosfomycin then can do Macrobid  x 5 days as ESBL E. Coli... In the future for this particular strain of ESBL PO options for treatment include augmentin, macrobid  and fosfo. - Follow-up with ID as needed       Loney Stank, MD [facility] 02/14/2023, 10:09 AM   I have personally spent 67 minutes involved in face-to-face and non-face-to-face activities for this patient on the day of the visit. Professional time spent includes the following activities: Preparing to see the patient (review of tests), Obtaining and/or reviewing separately obtained history (admission/discharge record), Performing a medically appropriate examination and/or evaluation , Ordering medications/tests/procedures, referring and communicating with other health care professionals, Documenting clinical information in the EMR, Independently interpreting results (not separately reported), Communicating results to the patient/family/caregiver, Counseling and educating the patient/family/caregiver and Care coordination (not separately reported).

## 2023-02-14 NOTE — Telephone Encounter (Signed)
 Medication not covered by insurance

## 2023-02-15 MED ORDER — NITROFURANTOIN MONOHYD MACRO 100 MG PO CAPS: 100.0000 mg | ORAL_CAPSULE | Freq: Two times a day (BID) | ORAL | 0 refills | Status: AC

## 2023-02-18 ENCOUNTER — Telehealth: Payer: Self-pay

## 2023-02-18 NOTE — Telephone Encounter (Signed)
 Received call from Pharmacy stating patient medication is not covered under insurance. Out of pocket cost for Fosfomycin is $185. Goodrx cost 45.07.  Will need to ask patient if she is okay with filling this at discounted cost.  Will send fpl group. Lorenda CHRISTELLA Code, RMA

## 2023-02-18 NOTE — Telephone Encounter (Signed)
 Message sent to pt with goodrx card.

## 2023-02-18 NOTE — Telephone Encounter (Signed)
 Spoke with patient regarding medication cost. States that she will use good rx card and fill prescription for fosfomycin at [facility]. Will call office if she is still unable to fill prescription.  [name], RMA

## 2023-03-05 ENCOUNTER — Ambulatory Visit: Payer: Medicare HMO | Attending: Cardiovascular Disease | Admitting: Cardiovascular Disease

## 2023-03-05 ENCOUNTER — Encounter: Payer: Self-pay | Admitting: Cardiovascular Disease

## 2023-03-05 VITALS — BP 108/60 | HR 67 | Ht 65.0 in | Wt 224.0 lb

## 2023-03-05 DIAGNOSIS — E785 Hyperlipidemia, unspecified: Secondary | ICD-10-CM | POA: Diagnosis not present

## 2023-03-05 DIAGNOSIS — R0602 Shortness of breath: Secondary | ICD-10-CM | POA: Diagnosis not present

## 2023-03-05 DIAGNOSIS — I2510 Atherosclerotic heart disease of native coronary artery without angina pectoris: Secondary | ICD-10-CM | POA: Diagnosis not present

## 2023-03-05 DIAGNOSIS — I10 Essential (primary) hypertension: Secondary | ICD-10-CM

## 2023-03-05 DIAGNOSIS — I739 Peripheral vascular disease, unspecified: Secondary | ICD-10-CM
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
## 2023-03-05 NOTE — Patient Instructions (Signed)
Medication Instructions:  Your physician recommends that you continue on your current medications as directed. Please refer to the Current Medication list given to you today.    *If you need a refill on your cardiac medications before your next appointment, please call your pharmacy*   Lab Work: None    If you have labs (blood work) drawn today and your tests are completely normal, you will receive your results only by: MyChart Message (if you have MyChart) OR A paper copy in the mail If you have any lab test that is abnormal or we need to change your treatment, we will call you to review the results.   Testing/Procedures: Echo will be scheduled at [address].  Your physician has requested that you have an echocardiogram. Echocardiography is a painless test that uses sound waves to create images of your heart. It provides your doctor with information about the size and shape of your heart and how well your heart's chambers and valves are working. This procedure takes approximately one hour. There are no restrictions for this procedure. Please do NOT wear cologne, perfume, aftershave, or lotions (deodorant is allowed). Please arrive 15 minutes prior to your appointment time.    Follow-Up: At [facility], you and your health needs are our priority.  As part of our continuing mission to provide you with exceptional heart care, we have created designated Provider Care Teams.  These Care Teams include your primary Cardiologist (physician) and Advanced Practice Providers (APPs -  Physician Assistants and Nurse Practitioners) who all work together to provide you with the care you need, when you need it.  We recommend signing up for the patient portal called "MyChart".  Sign up information is provided on this After Visit Summary.  MyChart is used to connect with patients for Virtual Visits (Telemedicine).  Patients are able to view lab/test results, encounter notes, upcoming  appointments, etc.  Non-urgent messages can be sent to your provider as well.   To learn more about what you can do with MyChart, go to [email or web address].    Your next appointment:   6 month(s)  The format for your next appointment:   In Person  Provider:   Lorine Bears, MD    Other Instructions

## 2023-03-06 ENCOUNTER — Ambulatory Visit (INDEPENDENT_AMBULATORY_CARE_PROVIDER_SITE_OTHER): Payer: Medicare HMO | Admitting: Urology

## 2023-03-06 ENCOUNTER — Encounter: Payer: Self-pay | Admitting: Urology

## 2023-03-06 VITALS — BP 135/77 | HR 73 | Ht 65.0 in | Wt 224.0 lb

## 2023-03-06 DIAGNOSIS — N390 Urinary tract infection, site not specified: Secondary | ICD-10-CM

## 2023-03-06 DIAGNOSIS — Z09 Encounter for follow-up examination after completed treatment for conditions other than malignant neoplasm: Secondary | ICD-10-CM

## 2023-03-06 DIAGNOSIS — R399 Unspecified symptoms and signs involving the genitourinary system: Secondary | ICD-10-CM | POA: Diagnosis not present

## 2023-03-06 DIAGNOSIS — Z87440 Personal history of urinary (tract) infections: Secondary | ICD-10-CM | POA: Diagnosis not present

## 2023-03-06 MED ORDER — ESTRADIOL 0.1 MG/GM VA CREA: TOPICAL_CREAM | VAGINAL | 12 refills | Status: DC
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
## 2023-03-08 ENCOUNTER — Other Ambulatory Visit: Payer: Self-pay | Admitting: Family Medicine

## 2023-03-08 DIAGNOSIS — Z1231 Encounter for screening mammogram for malignant neoplasm of breast: Secondary | ICD-10-CM

## 2023-03-09 LAB — URINE CULTURE

## 2023-03-11 DIAGNOSIS — E1165 Type 2 diabetes mellitus with hyperglycemia: Secondary | ICD-10-CM | POA: Diagnosis not present

## 2023-03-11 MED ORDER — NITROFURANTOIN MONOHYD MACRO 100 MG PO CAPS: 100.0000 mg | ORAL_CAPSULE | Freq: Two times a day (BID) | ORAL | 0 refills | Status: DC

## 2023-03-11 NOTE — Addendum Note (Signed)
Addended by: Joline Maxcy on: 03/11/2023 11:48 AM   Modules accepted: Orders

## 2023-03-13 LAB — URINALYSIS, ROUTINE W REFLEX MICROSCOPIC
Bilirubin, UA: NEGATIVE
Glucose, UA: NEGATIVE
Ketones, UA: NEGATIVE
Nitrite, UA: POSITIVE — AB
Specific Gravity, UA: 1.015 (ref 1.005–1.030)
Urobilinogen, Ur: 0.2 mg/dL (ref 0.2–1.0)
pH, UA: 6 (ref 5.0–7.5)

## 2023-03-15 ENCOUNTER — Ambulatory Visit (HOSPITAL_BASED_OUTPATIENT_CLINIC_OR_DEPARTMENT_OTHER)
Admission: RE | Admit: 2023-03-15 | Discharge: 2023-03-15 | Disposition: A | Discharge status: Home / Self Care | Payer: Medicare HMO | Source: Ambulatory Visit | Attending: Urology | Admitting: Urology

## 2023-03-15 DIAGNOSIS — N390 Urinary tract infection, site not specified: Secondary | ICD-10-CM

## 2023-03-15 DIAGNOSIS — N132 Hydronephrosis with renal and ureteral calculous obstruction: Secondary | ICD-10-CM | POA: Insufficient documentation

## 2023-03-15 DIAGNOSIS — K7460 Unspecified cirrhosis of liver: Secondary | ICD-10-CM | POA: Insufficient documentation

## 2023-03-16 ENCOUNTER — Other Ambulatory Visit: Payer: Self-pay | Admitting: Cardiovascular Disease

## 2023-03-19 ENCOUNTER — Ambulatory Visit
Admission: RE | Admit: 2023-03-19 | Discharge: 2023-03-19 | Disposition: A | Discharge status: Home / Self Care | Payer: Medicare HMO | Source: Ambulatory Visit | Attending: Family Medicine | Admitting: Family Medicine

## 2023-03-19 DIAGNOSIS — Z1231 Encounter for screening mammogram for malignant neoplasm of breast: Secondary | ICD-10-CM

## 2023-03-22 ENCOUNTER — Encounter: Payer: Self-pay | Admitting: Primary Care

## 2023-03-22 ENCOUNTER — Ambulatory Visit (INDEPENDENT_AMBULATORY_CARE_PROVIDER_SITE_OTHER): Payer: Medicare HMO | Admitting: Primary Care

## 2023-03-22 VITALS — BP 110/73 | HR 78 | Temp 97.1°F | Ht 65.0 in | Wt 218.4 lb

## 2023-03-22 DIAGNOSIS — R918 Other nonspecific abnormal finding of lung field: Secondary | ICD-10-CM

## 2023-03-22 DIAGNOSIS — J849 Interstitial pulmonary disease, unspecified: Secondary | ICD-10-CM | POA: Diagnosis not present

## 2023-03-22 DIAGNOSIS — J438 Other emphysema: Secondary | ICD-10-CM

## 2023-03-22 NOTE — Progress Notes (Signed)
 @Patient  ID: [name], female    DOB: May 20, 1955, 68 y.o.   MRN: [id number]       Chief Complaint  Patient presents with   Follow-up    Referring provider: Mila Palmer, MD  HPI: 68 year old female, former smoker. PMH significant for CAD, HTN, mediastinal adenopathy, ILD, pulmonary nodules, CAP, dibaetes, hypothyroidism, hyperlipidemia.  Previous [facility] pulmonary encounter: I suspect that this is sarcoidosis although she did not have granulomas on her biopsies.  She does have a positive ANA of unclear significance.  Consider sending to rheumatology.  I wanted to do a steroid trial to see if she would get clinical and radiographical benefit but she is very hesitant to do so, wants to avoid prednisone at all costs given the side effect profile, weight gain.  It may be reasonable to discuss with ILD colleagues to see if they would recommend starting an alternative without a steroid trial.  For now we will continue to follow serial imaging.  She is quite symptomatic, has unrelenting cough.  Again I think ultimately she will require a short course of corticosteroids to try and clear this.       03/22/2023 Discussed the use of AI scribe software for clinical note transcription with the patient, who gave verbal consent to proceed.  History of Present Illness   [name] is a 68 year old female with lung nodules who presents for an overdue follow-up.  She has a history of lung nodules initially suspected to be consistent with sarcoidosis, although a biopsy did not reveal granulomas. A positive ANA of unclear significance is present. She is hesitant to take prednisone due to concerns about side effects and weight gain, which she experienced previously. Serial imaging has been recommended to monitor the lung changes.  She experiences shortness of breath both at rest and with activity, describing her breathing as 'catching' and having difficulty taking deep breaths. No cough or wheezing is  present. Her weight has remained stable. No chest pain, fatigue, fevers, rashes, or eye inflammation, although she has had cataract surgery on one eye.  Her past medical history includes emphysema, identified on a CT scan. She is a former smoker, having quit over 20 years ago. She has not used her albuterol rescue inhaler in the past month and believes it may be out of date. She has not noticed any improvement with its use in the past.  She has a history of sleep apnea but does not use a CPAP machine due to discomfort with water entering her nose during use.  She reports joint pain localized to her knees.      Allergies  Allergen Reactions   Strawberry Extract Anaphylaxis   Cefdinir Other (See Comments)   Oxycodone Hcl Nausea And Vomiting   Sulfamethoxazole-Trimethoprim Rash   Topiramate Other (See Comments)    GI Upset, Dizzy, HA, Blurred Vision   Cymbalta [Duloxetine Hcl] Other (See Comments)    Dizzy    Lisinopril Itching, Swelling and Other (See Comments)    Angioedema    Lunesta [Eszopiclone] Other (See Comments)    Bad taste in mouth    Metformin And Related Other (See Comments)    Damaged patient's kidneys   Other Other (See Comments)    Pt is a Jehovah Witness. No blood products.   Sulfa Antibiotics Itching, Swelling and Other (See Comments)    Angioedema    Tramadol Other (See Comments)    Hallucinations    Trazodone And Nefazodone  Other (See Comments)    Shakes    Penicillins Rash and Other (See Comments)    "Paralyzed" (per patient) Has patient had a PCN reaction causing immediate rash, facial/tongue/throat swelling, SOB or lightheadedness with hypotension: Yes Has patient had a PCN reaction causing severe rash involving mucus membranes or skin necrosis: No Has patient had a PCN reaction that required hospitalization Yes Has patient had a PCN reaction occurring within the last 10 years: No If all of the above answers are "NO", then may proceed with Cephalosporin  use.     Immunization History  Administered Date(s) Administered   Influenza Split 01/04/2011, 11/05/2011, 12/10/2012   Influenza,inj,Quad PF,6+ Mos 11/06/2018, 11/21/2018, 11/27/2019   Influenza,inj,quad, With Preservative 11/05/2016   PFIZER(Purple Top)SARS-COV-2 Vaccination 04/05/2019, 04/26/2019, 11/13/2019   Pneumococcal Polysaccharide-23 10/26/2010   Tdap 12/23/2009, 07/15/2019   Zoster, Live 07/29/2020, 09/30/2020    Past Medical History:  Diagnosis Date   Acid reflux    Anxiety    ARF (acute renal failure) (HCC) 12/14/2011   Arthritis    Asthma    CAD (coronary artery disease)    a. Cath 2015 - moderate LAD, diagonal, and OM disease with 100% dRCA with L-R collaterals. b. 2019 nuc normal.   Carotid artery occlusion    Chronic pain following surgery or procedure 2008   Abdominal pain   Common migraine with intractable migraine 06/04/2019   Dehydration 02/09/2012   Diabetes mellitus    Type II   Diabetic hyperosmolar non-ketotic state (HCC) 04/09/2014   Headache(784.0)    Hepatic steatosis    noted on CT 04/2019   Hyperlipidemia    Hypertension    stress test- scheduled for 12/30/2012   Hypothyroidism    PAD (peripheral artery disease) (HCC)    a. presumed by noninvasive testing 2021.   Pneumonia    Prolonged QT interval    Pulmonary nodules/lesions, multiple 06/05/2019   SBO (small bowel obstruction) (HCC) 02/08/2012   Sleep apnea    Uses a Cpap   Stroke (HCC) 11/2012   tingling in L arm , slurred speech- came to ER    Tobacco History: Social History   Tobacco Use  Smoking Status Former   Current packs/day: 0.00   Average packs/day: 0.3 packs/day for 29.0 years (7.3 ttl pk-yrs)   Types: Cigarettes   Start date: 02/06/1972   Quit date: 02/05/2001   Years since quitting: 22.1  Smokeless Tobacco Never   Counseling given: Not Answered   Outpatient Medications Prior to Visit  Medication Sig Dispense Refill   ACCU-CHEK GUIDE test strip      Accu-Chek  Softclix Lancets lancets      albuterol (PROVENTIL) (2.5 MG/3ML) 0.083% nebulizer solution Take 3 mLs (2.5 mg total) by nebulization every 6 (six) hours as needed for wheezing or shortness of breath. 75 mL 0   amLODipine (NORVASC) 10 MG tablet Take 10 mg by mouth in the morning.     aspirin EC 81 MG tablet Take 1 tablet (81 mg total) by mouth daily. Swallow whole. 90 tablet 3   atenolol (TENORMIN) 100 MG tablet Take 1 tablet (100 mg total) by mouth daily. 90 tablet 3   B-D UF III MINI PEN NEEDLES 31G X 5 MM MISC      Blood Glucose Monitoring Suppl (ACCU-CHEK GUIDE) w/Device KIT      Cholecalciferol (VITAMIN D3) 125 MCG (5000 UT) TABS Take 5,000 Units by mouth in the morning.     cilostazol (PLETAL) 100 MG tablet TAKE 1  TABLET BY MOUTH TWICE A DAY 60 tablet 0   dicyclomine (BENTYL) 10 MG capsule Take 10 mg by mouth 3 (three) times daily before meals.     estradiol (ESTRACE) 0.1 MG/GM vaginal cream Apply 3 times weekly by using a pea-sized amount applied to fingertip 42.5 g 12   Evolocumab (REPATHA SURECLICK) 140 MG/ML SOAJ INJECT 1 PEN INTO THE SKIN EVERY 14 (FOURTEEN) DAYS. 6 mL 3   fosfomycin (MONUROL) 3 g PACK 3gm on day 1 and another 3gm on day 3 6 g 0   insulin aspart protamine - aspart (NOVOLOG 70/30 MIX) (70-30) 100 UNIT/ML FlexPen Inject 40 Units into the skin in the morning and at bedtime.     levothyroxine (SYNTHROID) 100 MCG tablet Take 100 mcg by mouth daily before breakfast.     nitrofurantoin, macrocrystal-monohydrate, (MACROBID) 100 MG capsule Take 1 capsule (100 mg total) by mouth 2 (two) times daily. 10 capsule 0   Nutritional Supplements (FRUIT & VEGETABLE DAILY PO) Take 6 capsules by mouth daily with lunch. Balance of Nature Fruit and Vegetable Supplements (3 Fruit + 3 Vegetable)     ondansetron (ZOFRAN) 4 MG tablet Take 1 tablet (4 mg total) by mouth every 8 (eight) hours as needed for nausea or vomiting. 20 tablet 0   PROAIR HFA 108 (90 Base) MCG/ACT inhaler Inhale 2-3 puffs  into the lungs every 6 (six) hours as needed for shortness of breath. 18 g 0   rosuvastatin (CRESTOR) 40 MG tablet Take 1 tablet (40 mg total) by mouth daily. 90 tablet 3   Semaglutide, 1 MG/DOSE, (OZEMPIC, 1 MG/DOSE,) 2 MG/1.5ML SOPN Inject 1 mg into the skin every Saturday.     spironolactone (ALDACTONE) 25 MG tablet TAKE 1 TABLET BY MOUTH EVERY DAY 90 tablet 3   zolpidem (AMBIEN CR) 12.5 MG CR tablet Take 12.5 mg by mouth at bedtime as needed for sleep.     pantoprazole (PROTONIX) 40 MG tablet Take 1 tablet (40 mg total) by mouth daily. (Patient taking differently: Take 40 mg by mouth daily as needed (indigestion/heartburn).)     No facility-administered medications prior to visit.    Review of Systems  Review of Systems  Constitutional: Negative.   HENT: Negative.    Respiratory:  Positive for shortness of breath. Negative for cough, chest tightness and wheezing.   Cardiovascular: Negative.    Physical Exam  BP 110/73 (BP Location: Left Arm, Patient Position: Sitting, Cuff Size: Large)   Pulse 78   Temp (!) 97.1 F (36.2 C) (Temporal)   Ht 5\' 5"  (1.651 m)   Wt 218 lb 6.4 oz (99.1 kg)   SpO2 95%   BMI 36.34 kg/m  Physical Exam Constitutional:      Appearance: Normal appearance.  HENT:     Head: Normocephalic and atraumatic.  Cardiovascular:     Rate and Rhythm: Normal rate and regular rhythm.  Pulmonary:     Effort: Pulmonary effort is normal.     Breath sounds: Normal breath sounds.  Neurological:     General: No focal deficit present.     Mental Status: She is alert and oriented to person, place, and time. Mental status is at baseline.  Psychiatric:        Mood and Affect: Mood normal.        Behavior: Behavior normal.        Thought Content: Thought content normal.        Judgment: Judgment normal.      Lab  Results:  CBC    Component Value Date/Time   WBC 5.5 01/16/2022 1050   WBC 4.3 05/08/2021 0940   RBC 5.52 (H) 01/16/2022 1050   RBC 5.42 (H)  05/08/2021 0940   HGB 14.9 01/16/2022 1050   HCT 47.0 (H) 01/16/2022 1050   PLT 262 01/16/2022 1050   MCV 85 01/16/2022 1050   MCH 27.0 01/16/2022 1050   MCH 26.8 05/08/2021 0940   MCHC 31.7 01/16/2022 1050   MCHC 31.3 05/08/2021 0940   RDW 14.2 01/16/2022 1050   LYMPHSABS 1.6 03/16/2021 1458   MONOABS 0.3 03/16/2021 1458   EOSABS 0.1 03/16/2021 1458   BASOSABS 0.0 03/16/2021 1458    BMET    Component Value Date/Time   NA 140 01/16/2022 1050   K 3.8 01/16/2022 1050   CL 102 01/16/2022 1050   CO2 22 01/16/2022 1050   GLUCOSE 131 (H) 01/16/2022 1050   GLUCOSE 165 (H) 05/08/2021 0940   BUN 7 (L) 01/16/2022 1050   CREATININE 0.88 01/16/2022 1050   CALCIUM 9.8 01/16/2022 1050   GFRNONAA >60 05/08/2021 0940   GFRAA 66 08/19/2019 1624    BNP No results found for: "BNP"  ProBNP No results found for: "PROBNP"  Imaging: MM 3D SCREENING MAMMOGRAM BILATERAL BREAST Result Date: 03/21/2023 CLINICAL DATA:  Screening. Technologist indicates best positioning/images possible. EXAM: DIGITAL SCREENING BILATERAL MAMMOGRAM WITH TOMOSYNTHESIS AND CAD TECHNIQUE: Bilateral screening digital craniocaudal and mediolateral oblique mammograms were obtained. Bilateral screening digital breast tomosynthesis was performed. The images were evaluated with computer-aided detection. COMPARISON:  Previous exam(s). ACR Breast Density Category b: There are scattered areas of fibroglandular density. FINDINGS: There are no findings suspicious for malignancy. IMPRESSION: No mammographic evidence of malignancy. A result letter of this screening mammogram will be mailed directly to the patient. RECOMMENDATION: Screening mammogram in one year. (Code:[id number]-B-01Y) BI-RADS CATEGORY  1: Negative. Electronically Signed   By: Sherron Ales M.D.   On: 03/21/2023 16:24     Assessment & Plan:   1. ILD (interstitial lung disease) (HCC) (Primary) - CT CHEST HIGH RESOLUTION; Future - Pulmonary function test; Future  2.  Pulmonary nodules/lesions, multiple - CT CHEST HIGH RESOLUTION; Future  3. Other emphysema (HCC) - Pulmonary function test; Future      Lung Nodules Suspected sarcoidosis based on CT findings, but no granulomas on biopsy. Patient declined prednisone due to side effects. Reports recurrent pneumonia and progressive shortness of breath. No cough or chest tightness -Order updated chest CT to assess progression of nodules. -Consider repeat bronchoscopy if CT shows progression. -Discuss alternative treatment options at followp-up accordingly   Emphysema History of smoking with image findings of emphysema. No significant obstructive defect on lung function test 3 years ago, but patient reports progressive shortness of breath. -Order repeat PFT to assess for changes in lung function -Start on a long-acting bronchodilator for emphysema. Trial Spiriva (we will give her 1.61mcg sample as 2.44mcg not available) -Refill albuterol rescue inhaler.  Sleep Apnea - Non-compliant with CPAP therapy due to discomfort.  General Health Maintenance -Continue monitoring for symptoms of systemic sarcoidosis (rashes, joint pain, eye inflammation).      Glenford Bayley, NP 03/22/2023

## 2023-03-22 NOTE — Patient Instructions (Signed)
  Dyspnea worse No cough or chest tightness Needs FU HRCT and PFTs Trial Spiriva- take two puffs daily in the morning (sample given and RX sent) Follow-up 2-3 months with Dr. Delton Coombes    Emphysema is a destructive disease of the lung. It is a form of chronic (long-term) lung disease and belongs to a group of lung diseases known as chronic obstructive pulmonary disease (COPD). The disease is characterized by the destruction of the alveoli (small sacs) that promote oxygen exchange between the air and the bloodstream. Smoking is the primary cause of emphysema, which makes it a preventable illness. There are also less common genetic causes of emphysema including alpha-1 antitrypsin deficiency.

## 2023-03-23 ENCOUNTER — Other Ambulatory Visit: Payer: Self-pay | Admitting: Cardiovascular Disease

## 2023-03-23 DIAGNOSIS — I739 Peripheral vascular disease, unspecified: Secondary | ICD-10-CM

## 2023-03-23 DIAGNOSIS — E785 Hyperlipidemia, unspecified: Secondary | ICD-10-CM

## 2023-03-23 DIAGNOSIS — I2510 Atherosclerotic heart disease of native coronary artery without angina pectoris: Secondary | ICD-10-CM

## 2023-03-25 ENCOUNTER — Ambulatory Visit (HOSPITAL_COMMUNITY): Payer: Medicare HMO

## 2023-03-28 ENCOUNTER — Other Ambulatory Visit: Payer: Medicare HMO | Admitting: Urology

## 2023-03-29 ENCOUNTER — Inpatient Hospital Stay (HOSPITAL_COMMUNITY): Payer: Medicare HMO | Admitting: Anesthesiology

## 2023-03-29 ENCOUNTER — Other Ambulatory Visit: Payer: Self-pay | Admitting: Urology

## 2023-03-29 ENCOUNTER — Emergency Department (HOSPITAL_COMMUNITY): Payer: Medicare HMO

## 2023-03-29 ENCOUNTER — Encounter (HOSPITAL_COMMUNITY)
Admission: EM | Disposition: A | Discharge status: Home / Self Care | Payer: Self-pay | Source: Home / Self Care | Attending: Internal Medicine

## 2023-03-29 ENCOUNTER — Encounter (HOSPITAL_COMMUNITY): Payer: Self-pay

## 2023-03-29 ENCOUNTER — Other Ambulatory Visit: Payer: Self-pay

## 2023-03-29 ENCOUNTER — Inpatient Hospital Stay (HOSPITAL_COMMUNITY)
Admission: EM | Admit: 2023-03-29 | Discharge: 2023-04-01 | DRG: 660 | Disposition: A | Discharge status: Home / Self Care | Payer: Medicare HMO | Attending: Internal Medicine | Admitting: Internal Medicine

## 2023-03-29 DIAGNOSIS — Z98891 History of uterine scar from previous surgery: Secondary | ICD-10-CM

## 2023-03-29 DIAGNOSIS — E119 Type 2 diabetes mellitus without complications: Secondary | ICD-10-CM

## 2023-03-29 DIAGNOSIS — R935 Abnormal findings on diagnostic imaging of other abdominal regions, including retroperitoneum: Secondary | ICD-10-CM | POA: Diagnosis not present

## 2023-03-29 DIAGNOSIS — M1990 Unspecified osteoarthritis, unspecified site: Secondary | ICD-10-CM | POA: Diagnosis present

## 2023-03-29 DIAGNOSIS — K219 Gastro-esophageal reflux disease without esophagitis: Secondary | ICD-10-CM | POA: Diagnosis present

## 2023-03-29 DIAGNOSIS — N179 Acute kidney failure, unspecified: Secondary | ICD-10-CM | POA: Diagnosis present

## 2023-03-29 DIAGNOSIS — B961 Klebsiella pneumoniae [K. pneumoniae] as the cause of diseases classified elsewhere: Secondary | ICD-10-CM | POA: Diagnosis present

## 2023-03-29 DIAGNOSIS — N200 Calculus of kidney: Secondary | ICD-10-CM | POA: Diagnosis present

## 2023-03-29 DIAGNOSIS — Z79890 Hormone replacement therapy (postmenopausal): Secondary | ICD-10-CM

## 2023-03-29 DIAGNOSIS — Z825 Family history of asthma and other chronic lower respiratory diseases: Secondary | ICD-10-CM

## 2023-03-29 DIAGNOSIS — I2510 Atherosclerotic heart disease of native coronary artery without angina pectoris: Secondary | ICD-10-CM

## 2023-03-29 DIAGNOSIS — Z8701 Personal history of pneumonia (recurrent): Secondary | ICD-10-CM

## 2023-03-29 DIAGNOSIS — G4730 Sleep apnea, unspecified: Secondary | ICD-10-CM | POA: Diagnosis present

## 2023-03-29 DIAGNOSIS — E1151 Type 2 diabetes mellitus with diabetic peripheral angiopathy without gangrene: Secondary | ICD-10-CM | POA: Diagnosis present

## 2023-03-29 DIAGNOSIS — E039 Hypothyroidism, unspecified: Secondary | ICD-10-CM | POA: Diagnosis present

## 2023-03-29 DIAGNOSIS — I6529 Occlusion and stenosis of unspecified carotid artery: Secondary | ICD-10-CM | POA: Diagnosis present

## 2023-03-29 DIAGNOSIS — Z794 Long term (current) use of insulin: Secondary | ICD-10-CM

## 2023-03-29 DIAGNOSIS — K760 Fatty (change of) liver, not elsewhere classified: Secondary | ICD-10-CM | POA: Diagnosis present

## 2023-03-29 DIAGNOSIS — Z880 Allergy status to penicillin: Secondary | ICD-10-CM

## 2023-03-29 DIAGNOSIS — J45909 Unspecified asthma, uncomplicated: Secondary | ICD-10-CM | POA: Diagnosis present

## 2023-03-29 DIAGNOSIS — Z87891 Personal history of nicotine dependence: Secondary | ICD-10-CM

## 2023-03-29 DIAGNOSIS — N952 Postmenopausal atrophic vaginitis: Secondary | ICD-10-CM | POA: Diagnosis present

## 2023-03-29 DIAGNOSIS — Z9851 Tubal ligation status: Secondary | ICD-10-CM

## 2023-03-29 DIAGNOSIS — Z9889 Other specified postprocedural states: Secondary | ICD-10-CM

## 2023-03-29 DIAGNOSIS — Z87892 Personal history of anaphylaxis: Secondary | ICD-10-CM

## 2023-03-29 DIAGNOSIS — Z90710 Acquired absence of both cervix and uterus: Secondary | ICD-10-CM

## 2023-03-29 DIAGNOSIS — Z6836 Body mass index (BMI) 36.0-36.9, adult: Secondary | ICD-10-CM

## 2023-03-29 DIAGNOSIS — Z882 Allergy status to sulfonamides status: Secondary | ICD-10-CM

## 2023-03-29 DIAGNOSIS — Z885 Allergy status to narcotic agent status: Secondary | ICD-10-CM

## 2023-03-29 DIAGNOSIS — N136 Pyonephrosis: Principal | ICD-10-CM | POA: Diagnosis present

## 2023-03-29 DIAGNOSIS — Z83438 Family history of other disorder of lipoprotein metabolism and other lipidemia: Secondary | ICD-10-CM

## 2023-03-29 DIAGNOSIS — Z9049 Acquired absence of other specified parts of digestive tract: Secondary | ICD-10-CM

## 2023-03-29 DIAGNOSIS — Z7902 Long term (current) use of antithrombotics/antiplatelets: Secondary | ICD-10-CM

## 2023-03-29 DIAGNOSIS — Z7985 Long-term (current) use of injectable non-insulin antidiabetic drugs: Secondary | ICD-10-CM

## 2023-03-29 DIAGNOSIS — Z8673 Personal history of transient ischemic attack (TIA), and cerebral infarction without residual deficits: Secondary | ICD-10-CM

## 2023-03-29 DIAGNOSIS — E669 Obesity, unspecified: Secondary | ICD-10-CM | POA: Diagnosis present

## 2023-03-29 DIAGNOSIS — G43019 Migraine without aura, intractable, without status migrainosus: Secondary | ICD-10-CM | POA: Diagnosis present

## 2023-03-29 DIAGNOSIS — I152 Hypertension secondary to endocrine disorders: Secondary | ICD-10-CM | POA: Diagnosis present

## 2023-03-29 DIAGNOSIS — Z809 Family history of malignant neoplasm, unspecified: Secondary | ICD-10-CM

## 2023-03-29 DIAGNOSIS — N390 Urinary tract infection, site not specified: Secondary | ICD-10-CM | POA: Diagnosis not present

## 2023-03-29 DIAGNOSIS — E785 Hyperlipidemia, unspecified: Secondary | ICD-10-CM | POA: Diagnosis present

## 2023-03-29 DIAGNOSIS — Z833 Family history of diabetes mellitus: Secondary | ICD-10-CM

## 2023-03-29 DIAGNOSIS — Z8719 Personal history of other diseases of the digestive system: Secondary | ICD-10-CM

## 2023-03-29 DIAGNOSIS — Z95820 Peripheral vascular angioplasty status with implants and grafts: Secondary | ICD-10-CM | POA: Diagnosis not present

## 2023-03-29 DIAGNOSIS — I10 Essential (primary) hypertension: Secondary | ICD-10-CM | POA: Diagnosis present

## 2023-03-29 DIAGNOSIS — N132 Hydronephrosis with renal and ureteral calculous obstruction: Secondary | ICD-10-CM | POA: Diagnosis present

## 2023-03-29 DIAGNOSIS — Z888 Allergy status to other drugs, medicaments and biological substances status: Secondary | ICD-10-CM

## 2023-03-29 DIAGNOSIS — Z1624 Resistance to multiple antibiotics: Secondary | ICD-10-CM | POA: Diagnosis present

## 2023-03-29 DIAGNOSIS — Z7982 Long term (current) use of aspirin: Secondary | ICD-10-CM

## 2023-03-29 DIAGNOSIS — Z8249 Family history of ischemic heart disease and other diseases of the circulatory system: Secondary | ICD-10-CM

## 2023-03-29 DIAGNOSIS — R918 Other nonspecific abnormal finding of lung field: Secondary | ICD-10-CM | POA: Diagnosis present

## 2023-03-29 DIAGNOSIS — Z823 Family history of stroke: Secondary | ICD-10-CM

## 2023-03-29 DIAGNOSIS — B9629 Other Escherichia coli [E. coli] as the cause of diseases classified elsewhere: Secondary | ICD-10-CM | POA: Diagnosis not present

## 2023-03-29 DIAGNOSIS — Z79899 Other long term (current) drug therapy: Secondary | ICD-10-CM

## 2023-03-29 DIAGNOSIS — Z881 Allergy status to other antibiotic agents status: Secondary | ICD-10-CM

## 2023-03-29 DIAGNOSIS — N201 Calculus of ureter: Secondary | ICD-10-CM | POA: Diagnosis not present

## 2023-03-29 DIAGNOSIS — E1159 Type 2 diabetes mellitus with other circulatory complications: Secondary | ICD-10-CM | POA: Diagnosis present

## 2023-03-29 DIAGNOSIS — Z87440 Personal history of urinary (tract) infections: Secondary | ICD-10-CM

## 2023-03-29 DIAGNOSIS — Z9089 Acquired absence of other organs: Secondary | ICD-10-CM

## 2023-03-29 DIAGNOSIS — N3001 Acute cystitis with hematuria: Secondary | ICD-10-CM | POA: Diagnosis not present

## 2023-03-29 DIAGNOSIS — J849 Interstitial pulmonary disease, unspecified: Secondary | ICD-10-CM | POA: Diagnosis present

## 2023-03-29 DIAGNOSIS — B9620 Unspecified Escherichia coli [E. coli] as the cause of diseases classified elsewhere: Secondary | ICD-10-CM | POA: Diagnosis present

## 2023-03-29 DIAGNOSIS — Z91018 Allergy to other foods: Secondary | ICD-10-CM

## 2023-03-29 HISTORY — PX: CYSTOSCOPY W/ URETERAL STENT PLACEMENT: SHX1429

## 2023-03-29 LAB — CBC WITH DIFFERENTIAL/PLATELET
Abs Immature Granulocytes: 0.01 10*3/uL (ref 0.00–0.07)
Basophils Absolute: 0 10*3/uL (ref 0.0–0.1)
Basophils Relative: 1 %
Eosinophils Absolute: 0.3 10*3/uL (ref 0.0–0.5)
Eosinophils Relative: 4 %
HCT: 47 % — ABNORMAL HIGH (ref 36.0–46.0)
Hemoglobin: 15.5 g/dL — ABNORMAL HIGH (ref 12.0–15.0)
Immature Granulocytes: 0 %
Lymphocytes Relative: 36 %
Lymphs Abs: 2 10*3/uL (ref 0.7–4.0)
MCH: 27 pg (ref 26.0–34.0)
MCHC: 33 g/dL (ref 30.0–36.0)
MCV: 81.7 fL (ref 80.0–100.0)
Monocytes Absolute: 0.4 10*3/uL (ref 0.1–1.0)
Monocytes Relative: 8 %
Neutro Abs: 2.9 10*3/uL (ref 1.7–7.7)
Neutrophils Relative %: 51 %
Platelets: 274 10*3/uL (ref 150–400)
RBC: 5.75 MIL/uL — ABNORMAL HIGH (ref 3.87–5.11)
RDW: 14.3 % (ref 11.5–15.5)
WBC: 5.6 10*3/uL (ref 4.0–10.5)
nRBC: 0 % (ref 0.0–0.2)

## 2023-03-29 LAB — GLUCOSE, CAPILLARY
Glucose-Capillary: 87 mg/dL (ref 70–99)
Glucose-Capillary: 106 mg/dL — ABNORMAL HIGH (ref 70–99)

## 2023-03-29 LAB — COMPREHENSIVE METABOLIC PANEL
ALT: 14 U/L (ref 0–44)
AST: 21 U/L (ref 15–41)
Albumin: 3.4 g/dL — ABNORMAL LOW (ref 3.5–5.0)
Alkaline Phosphatase: 75 U/L (ref 38–126)
Anion gap: 10 (ref 5–15)
BUN: 11 mg/dL (ref 8–23)
CO2: 18 mmol/L — ABNORMAL LOW (ref 22–32)
Calcium: 9.6 mg/dL (ref 8.9–10.3)
Chloride: 108 mmol/L (ref 98–111)
Creatinine, Ser: 1.45 mg/dL — ABNORMAL HIGH (ref 0.44–1.00)
GFR, Estimated: 39 mL/min — ABNORMAL LOW (ref >60)
Glucose, Bld: 142 mg/dL — ABNORMAL HIGH (ref 70–99)
Potassium: 3.7 mmol/L (ref 3.5–5.1)
Sodium: 136 mmol/L (ref 135–145)
Total Bilirubin: 0.7 mg/dL (ref 0.0–1.2)
Total Protein: 8.9 g/dL — ABNORMAL HIGH (ref 6.5–8.1)

## 2023-03-29 LAB — I-STAT CG4 LACTIC ACID, ED: Lactic Acid, Venous: 1.3 mmol/L (ref 0.5–1.9)

## 2023-03-29 LAB — URINALYSIS, W/ REFLEX TO CULTURE (INFECTION SUSPECTED)
Bilirubin Urine: NEGATIVE
Glucose, UA: NEGATIVE mg/dL
Hgb urine dipstick: NEGATIVE
Ketones, ur: NEGATIVE mg/dL
Nitrite: NEGATIVE
Protein, ur: 100 mg/dL — AB
Specific Gravity, Urine: 1.009 (ref 1.005–1.030)
WBC, UA: >50 WBC/hpf (ref 0–5)
pH: 5 (ref 5.0–8.0)

## 2023-03-29 LAB — HEMOGLOBIN A1C
Hgb A1c MFr Bld: 7.5 % — ABNORMAL HIGH (ref 4.8–5.6)
Mean Plasma Glucose: 168.55 mg/dL

## 2023-03-29 LAB — NO BLOOD PRODUCTS

## 2023-03-29 SURGERY — CYSTOSCOPY, WITH RETROGRADE PYELOGRAM AND URETERAL STENT INSERTION
Anesthesia: General | Site: Ureter | Laterality: Left

## 2023-03-29 MED ORDER — INSULIN ASPART 100 UNIT/ML IJ SOLN
0.0000 [IU] | Freq: Three times a day (TID) | INTRAMUSCULAR | Status: DC
  Administered 2023-03-30: 11 [IU] via SUBCUTANEOUS
  Administered 2023-03-30: 15 [IU] via SUBCUTANEOUS
  Administered 2023-03-30: 11 [IU] via SUBCUTANEOUS

## 2023-03-29 MED ORDER — LIDOCAINE 2% (20 MG/ML) 5 ML SYRINGE
INTRAMUSCULAR | Status: DC | PRN
  Administered 2023-03-29: 100 mg via INTRAVENOUS

## 2023-03-29 MED ORDER — SODIUM CHLORIDE 0.9 % IV SOLN
1.0000 g | Freq: Once | INTRAVENOUS | Status: AC
  Administered 2023-03-29: 1 g via INTRAVENOUS
  Filled 2023-03-29: qty 20

## 2023-03-29 MED ORDER — DEXAMETHASONE SODIUM PHOSPHATE 10 MG/ML IJ SOLN
INTRAMUSCULAR | Status: DC | PRN
  Administered 2023-03-29: 5 mg via INTRAVENOUS

## 2023-03-29 MED ORDER — IOHEXOL 300 MG/ML  SOLN
INTRAMUSCULAR | Status: DC | PRN
  Administered 2023-03-29: 10 mL

## 2023-03-29 MED ORDER — LIDOCAINE 2% (20 MG/ML) 5 ML SYRINGE
INTRAMUSCULAR | Status: AC
  Filled 2023-03-29: qty 5

## 2023-03-29 MED ORDER — FENTANYL CITRATE (PF) 100 MCG/2ML IJ SOLN: 25.0000 ug | INTRAMUSCULAR | Status: DC | PRN

## 2023-03-29 MED ORDER — ACETAMINOPHEN 650 MG RE SUPP: 650.0000 mg | Freq: Four times a day (QID) | RECTAL | Status: DC | PRN

## 2023-03-29 MED ORDER — ORAL CARE MOUTH RINSE: 15.0000 mL | Freq: Once | OROMUCOSAL | Status: AC

## 2023-03-29 MED ORDER — LACTATED RINGERS IV BOLUS
1000.0000 mL | Freq: Once | INTRAVENOUS | Status: AC
  Administered 2023-03-29: 1000 mL via INTRAVENOUS

## 2023-03-29 MED ORDER — RISAQUAD PO CAPS
1.0000 | ORAL_CAPSULE | Freq: Every day | ORAL | Status: DC
  Administered 2023-03-30 – 2023-04-01 (×3): 1 via ORAL
  Filled 2023-03-29 (×4): qty 1

## 2023-03-29 MED ORDER — SODIUM CHLORIDE 0.9 % IV SOLN
1.0000 g | Freq: Two times a day (BID) | INTRAVENOUS | Status: DC
  Administered 2023-03-30 – 2023-04-01 (×5): 1 g via INTRAVENOUS
  Filled 2023-03-29 (×5): qty 20

## 2023-03-29 MED ORDER — ONDANSETRON HCL 4 MG/2ML IJ SOLN
INTRAMUSCULAR | Status: AC
  Filled 2023-03-29: qty 2

## 2023-03-29 MED ORDER — ACETAMINOPHEN 10 MG/ML IV SOLN
INTRAVENOUS | Status: AC
  Filled 2023-03-29: qty 100

## 2023-03-29 MED ORDER — ACETAMINOPHEN 325 MG PO TABS
650.0000 mg | ORAL_TABLET | Freq: Four times a day (QID) | ORAL | Status: DC | PRN
  Administered 2023-03-29: 650 mg via ORAL
  Filled 2023-03-29: qty 2

## 2023-03-29 MED ORDER — CHLORHEXIDINE GLUCONATE 0.12 % MT SOLN
15.0000 mL | Freq: Once | OROMUCOSAL | Status: AC
  Administered 2023-03-29: 15 mL via OROMUCOSAL

## 2023-03-29 MED ORDER — EPHEDRINE SULFATE-NACL 50-0.9 MG/10ML-% IV SOSY
PREFILLED_SYRINGE | INTRAVENOUS | Status: DC | PRN
  Administered 2023-03-29 (×2): 5 mg via INTRAVENOUS
  Administered 2023-03-29: 10 mg via INTRAVENOUS
  Administered 2023-03-29: 5 mg via INTRAVENOUS

## 2023-03-29 MED ORDER — MIDAZOLAM HCL 2 MG/2ML IJ SOLN
INTRAMUSCULAR | Status: DC | PRN
Start: 2023-03-29 — End: 2023-03-29
  Administered 2023-03-29: 2 mg via INTRAVENOUS

## 2023-03-29 MED ORDER — IPRATROPIUM-ALBUTEROL 0.5-2.5 (3) MG/3ML IN SOLN
RESPIRATORY_TRACT | Status: AC
  Administered 2023-03-29: 3 mL
  Filled 2023-03-29: qty 3

## 2023-03-29 MED ORDER — ALBUTEROL SULFATE HFA 108 (90 BASE) MCG/ACT IN AERS
INHALATION_SPRAY | RESPIRATORY_TRACT | Status: DC | PRN
  Administered 2023-03-29: 6 via RESPIRATORY_TRACT

## 2023-03-29 MED ORDER — ONDANSETRON HCL 4 MG/2ML IJ SOLN: 4.0000 mg | Freq: Four times a day (QID) | INTRAMUSCULAR | Status: DC | PRN

## 2023-03-29 MED ORDER — MEPERIDINE HCL 25 MG/ML IJ SOLN: 6.2500 mg | INTRAMUSCULAR | Status: DC | PRN

## 2023-03-29 MED ORDER — PROPOFOL 10 MG/ML IV BOLUS
INTRAVENOUS | Status: DC | PRN
  Administered 2023-03-29: 170 mg via INTRAVENOUS

## 2023-03-29 MED ORDER — IPRATROPIUM-ALBUTEROL 0.5-2.5 (3) MG/3ML IN SOLN: 3.0000 mL | RESPIRATORY_TRACT | Status: DC

## 2023-03-29 MED ORDER — FENTANYL CITRATE (PF) 250 MCG/5ML IJ SOLN
INTRAMUSCULAR | Status: AC
  Filled 2023-03-29: qty 5

## 2023-03-29 MED ORDER — PROPOFOL 10 MG/ML IV BOLUS
INTRAVENOUS | Status: AC
  Filled 2023-03-29: qty 20

## 2023-03-29 MED ORDER — DROPERIDOL 2.5 MG/ML IJ SOLN: 0.6250 mg | Freq: Once | INTRAMUSCULAR | Status: DC | PRN

## 2023-03-29 MED ORDER — 0.9 % SODIUM CHLORIDE (POUR BTL) OPTIME
TOPICAL | Status: DC | PRN
  Administered 2023-03-29: 1000 mL

## 2023-03-29 MED ORDER — EPHEDRINE 5 MG/ML INJ
INTRAVENOUS | Status: AC
  Filled 2023-03-29: qty 5

## 2023-03-29 MED ORDER — INSULIN ASPART 100 UNIT/ML IJ SOLN: 0.0000 [IU] | INTRAMUSCULAR | Status: DC | PRN

## 2023-03-29 MED ORDER — FENTANYL CITRATE (PF) 250 MCG/5ML IJ SOLN
INTRAMUSCULAR | Status: DC | PRN
Start: 2023-03-29 — End: 2023-03-29
  Administered 2023-03-29 (×3): 50 ug via INTRAVENOUS

## 2023-03-29 MED ORDER — ACETAMINOPHEN 10 MG/ML IV SOLN
1000.0000 mg | Freq: Once | INTRAVENOUS | Status: AC
  Administered 2023-03-29: 1000 mg via INTRAVENOUS

## 2023-03-29 MED ORDER — DEXAMETHASONE SODIUM PHOSPHATE 10 MG/ML IJ SOLN
INTRAMUSCULAR | Status: AC
  Filled 2023-03-29: qty 1

## 2023-03-29 MED ORDER — HEPARIN SODIUM (PORCINE) 5000 UNIT/ML IJ SOLN: 5000.0000 [IU] | Freq: Three times a day (TID) | INTRAMUSCULAR | Status: DC

## 2023-03-29 MED ORDER — SUCCINYLCHOLINE CHLORIDE 200 MG/10ML IV SOSY
PREFILLED_SYRINGE | INTRAVENOUS | Status: DC | PRN
  Administered 2023-03-29: 120 mg via INTRAVENOUS

## 2023-03-29 MED ORDER — LACTATED RINGERS IV SOLN: INTRAVENOUS | Status: DC

## 2023-03-29 MED ORDER — PHENYLEPHRINE 80 MCG/ML (10ML) SYRINGE FOR IV PUSH (FOR BLOOD PRESSURE SUPPORT)
PREFILLED_SYRINGE | INTRAVENOUS | Status: AC
  Filled 2023-03-29: qty 10

## 2023-03-29 MED ORDER — HEPARIN SODIUM (PORCINE) 5000 UNIT/ML IJ SOLN
5000.0000 [IU] | Freq: Two times a day (BID) | INTRAMUSCULAR | Status: DC
  Administered 2023-03-30 – 2023-04-01 (×5): 5000 [IU] via SUBCUTANEOUS
  Filled 2023-03-29 (×5): qty 1

## 2023-03-29 MED ORDER — LEVOTHYROXINE SODIUM 100 MCG PO TABS
100.0000 ug | ORAL_TABLET | Freq: Every day | ORAL | Status: DC
  Administered 2023-03-30 – 2023-04-01 (×3): 100 ug via ORAL
  Filled 2023-03-29 (×3): qty 1

## 2023-03-29 MED ORDER — PHENYLEPHRINE 80 MCG/ML (10ML) SYRINGE FOR IV PUSH (FOR BLOOD PRESSURE SUPPORT)
PREFILLED_SYRINGE | INTRAVENOUS | Status: DC | PRN
Start: 2023-03-29 — End: 2023-03-29
  Administered 2023-03-29 (×2): 80 ug via INTRAVENOUS

## 2023-03-29 MED ORDER — ONDANSETRON HCL 4 MG/2ML IJ SOLN
INTRAMUSCULAR | Status: DC | PRN
  Administered 2023-03-29: 4 mg via INTRAVENOUS

## 2023-03-29 MED ORDER — ONDANSETRON HCL 4 MG PO TABS: 4.0000 mg | ORAL_TABLET | Freq: Four times a day (QID) | ORAL | Status: DC | PRN

## 2023-03-29 MED ORDER — MIDAZOLAM HCL 2 MG/2ML IJ SOLN
INTRAMUSCULAR | Status: AC
  Filled 2023-03-29: qty 2

## 2023-03-29 MED ORDER — STERILE WATER FOR IRRIGATION IR SOLN
Status: DC | PRN
  Administered 2023-03-29: 3000 mL

## 2023-03-29 MED ORDER — BISACODYL 5 MG PO TBEC: 5.0000 mg | DELAYED_RELEASE_TABLET | Freq: Every day | ORAL | Status: DC | PRN

## 2023-03-29 SURGICAL SUPPLY — 15 items
BAG URO CATCHER STRL LF (MISCELLANEOUS) ×1 IMPLANT
GLOVE BIO SURGEON STRL SZ7.5 (GLOVE) ×1 IMPLANT
GOWN STRL REUS W/ TWL LRG LVL3 (GOWN DISPOSABLE) ×1 IMPLANT
GOWN STRL REUS W/ TWL XL LVL3 (GOWN DISPOSABLE) ×1 IMPLANT
GUIDEWIRE STR DUAL SENSOR (WIRE) ×1 IMPLANT
KIT TURNOVER KIT B (KITS) ×1 IMPLANT
MANIFOLD NEPTUNE II (INSTRUMENTS) IMPLANT
NS IRRIG 1000ML POUR BTL (IV SOLUTION) ×1 IMPLANT
PACK CYSTO (CUSTOM PROCEDURE TRAY) ×1 IMPLANT
PAD ARMBOARD 7.5X6 YLW CONV (MISCELLANEOUS) ×2 IMPLANT
STENT URET 6FRX24 CONTOUR (STENTS) IMPLANT
SYPHON OMNI JUG (MISCELLANEOUS) ×1 IMPLANT
TOWEL GREEN STERILE FF (TOWEL DISPOSABLE) ×1 IMPLANT
TUBE CONNECTING 12X1/4 (SUCTIONS) IMPLANT
UNDERPAD 30X36 HEAVY ABSORB (UNDERPADS AND DIAPERS) ×1 IMPLANT
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
## 2023-03-29 NOTE — Assessment & Plan Note (Signed)
 Continue monitoring patient and managing on glycemic protocol.

## 2023-03-29 NOTE — Anesthesia Procedure Notes (Signed)
 Procedure Name: Intubation Date/Time: 03/29/2023 4:44 PM  Performed by: Colbert Coyer, CRNAPre-anesthesia Checklist: Patient identified, Emergency Drugs available, Suction available and Patient being monitored Patient Re-evaluated:Patient Re-evaluated prior to induction Oxygen Delivery Method: Circle System Utilized Preoxygenation: Pre-oxygenation with 100% oxygen Induction Type: IV induction and Rapid sequence Laryngoscope Size: Mac and 4 Grade View: Grade I Tube type: Oral Number of attempts: 1 Airway Equipment and Method: Stylet Placement Confirmation: ETT inserted through vocal cords under direct vision, positive ETCO2 and breath sounds checked- equal and bilateral Secured at: 21 cm Tube secured with: Tape Dental Injury: Teeth and Oropharynx as per pre-operative assessment

## 2023-03-29 NOTE — Op Note (Signed)
 Preoperative diagnosis: Left proximal ureteral stone, MDR UTI Postoperative diagnosis: Same  Procedure: Cystoscopy with left retrograde pyelogram, left ureteral stent placement  Surgeon: Mena Goes  Anesthesia: General  Indication for procedure: [name] is a 68 year old female with a history of recurrent UTI.  CT scan was done to rule out stone and she was found to have a 5 mm left proximal stone and hydronephrosis.  She is brought for stent placement to plan antibiotics and staged ureteroscopy.  Findings: On exam the vulva was normal without lesion.  Moderate vaginal atrophy.  Meatus appeared normal.  The bladder and urethra were palpably normal without evidence of urethral diverticulum.  On cystoscopy the urethra and bladder were unremarkable.  The trigone and ureteral orifices were in their normal orthotopic position with clear E flux.  No stone or foreign body in the bladder.  No mucosal lesions or papillary lesions.  Left retrograde pyelogram-this outlined a single ureter single collecting system unit with a filling defect in the proximal ureter consistent with the stone.  There was moderate dilation proximal to the stone and some tortuosity proximally.  Description of procedure: After consent was obtained patient brought to the operating room.  After adequate anesthesia she was placed lithotomy position and prepped and draped in the usual sterile fashion.  Timeout was performed to confirm the patient and procedure.  Cystoscope was passed per urethra and the bladder inspected.  Left ureteral orifice was cannulated with a 6 [place] open-ended catheter and gentle retrograde injection of contrast was performed.  A sensor wire was then advanced and it coiled at the stone.  I then passed the 6 [place] open-ended catheter over the wire up toward the stone which help brace the wire and allowed it to pass the stone without difficulty.  It coiled in an upper calyx.  The open-ended catheter was removed and a 624  cm stent was advanced.  The wire was removed with a good coil seen up in the kidney and a good coil in the bladder.  Stent was draining well.  The bladder was drained and the scope removed.  Exam under anesthesia was performed.  She was then awakened taken the cover room in stable condition.  Complications: None  Blood loss: Minimal  Specimens: None  Drains: 6 x 24 cm left ureteral stent  Disposition: Patient stable to PACU

## 2023-03-29 NOTE — ED Provider Notes (Signed)
 [place] EMERGENCY DEPARTMENT AT [facility] Provider Note   CSN: [id number] Arrival date & time: 03/29/23  1106     History  Chief Complaint  Patient presents with   Kidney [name] is a 68 y.o. female history recent kidney stone the left ureter 5 mm found earlier this month on a CT scan, diabetes, CAD, hyperlipidemia, urine that grew Klebsiella that is highly resistant presented for concerns of septic stone.  Patient went to see her urologist Dr. Margo Aye and was told to come in to be evaluated for suspected septic stone.  Patient not had any fevers, nausea vomiting, hematuria but does state that [name] has some discomfort when urinating.  Patient denies any flank pain chest pain shortness of breath.  Patient is accompanied by her daughter.    Home Medications Prior to Admission medications   Medication Sig Start Date End Date Taking? Authorizing Provider  ACCU-CHEK GUIDE test strip  06/25/19   [provider]  Accu-Chek Softclix Lancets lancets  06/25/19   [provider]  albuterol (PROVENTIL) (2.5 MG/3ML) 0.083% nebulizer solution Take 3 mLs (2.5 mg total) by nebulization every 6 (six) hours as needed for wheezing or shortness of breath. 02/14/19   Cathie Hoops, Amy V, PA-C  amLODipine (NORVASC) 10 MG tablet Take 10 mg by mouth in the morning.    [provider]  aspirin EC 81 MG tablet Take 1 tablet (81 mg total) by mouth daily. Swallow whole. 10/13/20   Dunn, Tacey Ruiz, PA-C  atenolol (TENORMIN) 100 MG tablet Take 1 tablet (100 mg total) by mouth daily. 12/18/22   Wendall Stade, MD  B-D UF III MINI PEN NEEDLES 31G X 5 MM MISC  07/22/20   [provider]  Blood Glucose Monitoring Suppl (ACCU-CHEK GUIDE) w/Device KIT  06/29/19   [provider]  Cholecalciferol (VITAMIN D3) 125 MCG (5000 UT) TABS Take 5,000 Units by mouth in the morning.    [provider]  cilostazol (PLETAL) 100 MG tablet TAKE 1 TABLET BY MOUTH TWICE A DAY  03/18/23   Wendall Stade, MD  dicyclomine (BENTYL) 10 MG capsule Take 10 mg by mouth 3 (three) times daily before meals.    [provider]  estradiol (ESTRACE) 0.1 MG/GM vaginal cream Apply 3 times weekly by using a pea-sized amount applied to fingertip 03/06/23   Joline Maxcy, MD  Evolocumab (REPATHA SURECLICK) 140 MG/ML SOAJ INJECT 1 PEN INTO THE SKIN EVERY 14 (FOURTEEN) DAYS. 03/29/23   Iran Ouch, MD  fosfomycin (MONUROL) 3 g PACK 3gm on day 1 and another 3gm on day 3 02/14/23   Danelle Earthly, MD  insulin aspart protamine - aspart (NOVOLOG 70/30 MIX) (70-30) 100 UNIT/ML FlexPen Inject 40 Units into the skin in the morning and at bedtime. 05/16/16   [provider]  levothyroxine (SYNTHROID) 100 MCG tablet Take 100 mcg by mouth daily before breakfast.    [provider]  nitrofurantoin, macrocrystal-monohydrate, (MACROBID) 100 MG capsule Take 1 capsule (100 mg total) by mouth 2 (two) times daily. 03/11/23   Joline Maxcy, MD  Nutritional Supplements (FRUIT & VEGETABLE DAILY PO) Take 6 capsules by mouth daily with lunch. Balance of Nature Fruit and Vegetable Supplements (3 Fruit + 3 Vegetable)    [provider]  ondansetron (ZOFRAN) 4 MG tablet Take 1 tablet (4 mg total) by mouth every 8 (eight) hours as needed for nausea or vomiting. 09/06/20   Scheeler,  Kermit Balo, PA-C  pantoprazole (PROTONIX) 40 MG tablet Take 1 tablet (40 mg total) by mouth daily. Patient taking differently: Take 40 mg by mouth daily as needed (indigestion/heartburn). 06/07/21 06/07/22  Leslye Peer, MD  PROAIR HFA 108 [phone] Base) MCG/ACT inhaler Inhale 2-3 puffs into the lungs every 6 (six) hours as needed for shortness of breath. 02/07/19   Hall-Potvin, [place], PA-C  rosuvastatin (CRESTOR) 40 MG tablet Take 1 tablet (40 mg total) by mouth daily. 06/23/20 05/04/23  Pokhrel, Rebekah [place], MD  Semaglutide, 1 MG/DOSE, (OZEMPIC, 1 MG/DOSE,) 2 MG/1.5ML SOPN Inject 1 mg into the skin every Saturday.     [provider]  spironolactone (ALDACTONE) 25 MG tablet TAKE 1 TABLET BY MOUTH EVERY DAY 05/23/22   Wendall Stade, MD  zolpidem (AMBIEN CR) 12.5 MG CR tablet Take 12.5 mg by mouth at bedtime as needed for sleep.    [provider]      Allergies    Strawberry extract, Cefdinir, Oxycodone hcl, Sulfamethoxazole-trimethoprim, Topiramate, Cymbalta [duloxetine hcl], Lisinopril, Lunesta [eszopiclone], Metformin and related, Other, Sulfa antibiotics, Tramadol, Trazodone and nefazodone, and Penicillins    Review of Systems   Review of Systems  Physical Exam Updated Vital Signs BP 110/75   Pulse 71   Temp (!) 97.4 F (36.3 C)   Resp 16   Ht 5\' 5"  (1.651 m)   Wt 98.4 kg   SpO2 97%   BMI 36.11 kg/m  Physical Exam Vitals reviewed.  Constitutional:      General: [name] is not in acute distress. HENT:     Head: Normocephalic and atraumatic.  Eyes:     Extraocular Movements: Extraocular movements intact.     Conjunctiva/sclera: Conjunctivae normal.     Pupils: Pupils are equal, round, and reactive to light.  Cardiovascular:     Rate and Rhythm: Normal rate and regular rhythm.     Pulses: Normal pulses.     Heart sounds: Normal heart sounds.     Comments: 2+ bilateral radial/dorsalis pedis pulses with regular rate Pulmonary:     Effort: Pulmonary effort is normal. No respiratory distress.     Breath sounds: Normal breath sounds.  Abdominal:     Palpations: Abdomen is soft.     Tenderness: There is no abdominal tenderness. There is left CVA tenderness. There is no right CVA tenderness, guarding or rebound.  Musculoskeletal:        General: Normal range of motion.     Cervical back: Normal range of motion and neck supple.     Comments: 5 out of 5 bilateral grip/leg extension strength  Skin:    General: Skin is warm and dry.     Capillary Refill: Capillary refill takes less than 2 seconds.  Neurological:     General: No focal deficit present.     Mental Status: [name]  is alert and oriented to person, place, and time.     Comments: Sensation intact in all 4 limbs  Psychiatric:        Mood and Affect: Mood normal.     ED Results / Procedures / Treatments   Labs (all labs ordered are listed, but only abnormal results are displayed) Labs Reviewed  COMPREHENSIVE METABOLIC PANEL - Abnormal; Notable for the following components:      Result Value   CO2 18 (*)    Glucose, Bld 142 (*)    Creatinine, Ser 1.45 (*)    Total Protein 8.9 (*)    Albumin 3.4 (*)  GFR, Estimated 39 (*)    All other components within normal limits  CBC WITH DIFFERENTIAL/PLATELET - Abnormal; Notable for the following components:   RBC 5.75 (*)    Hemoglobin 15.5 (*)    HCT 47.0 (*)    All other components within normal limits  URINALYSIS, W/ REFLEX TO CULTURE (INFECTION SUSPECTED) - Abnormal; Notable for the following components:   APPearance CLOUDY (*)    Protein, ur 100 (*)    Leukocytes,Ua LARGE (*)    Bacteria, UA MANY (*)    All other components within normal limits  URINE CULTURE  I-STAT CG4 LACTIC ACID, ED  I-STAT CG4 LACTIC ACID, ED    EKG None  Radiology No results found.  Procedures .Critical Care  Performed by: Netta Corrigan, PA-C Authorized by: Netta Corrigan, PA-C   Critical care provider statement:    Critical care time (minutes):  30   Critical care time was exclusive of:  Separately billable procedures and treating other patients   Critical care was necessary to treat or prevent imminent or life-threatening deterioration of the following conditions:  Dehydration (Septic stone)   Critical care was time spent personally by me on the following activities:  Blood draw for specimens, development of treatment plan with patient or surrogate, discussions with consultants, evaluation of patient's response to treatment, examination of patient, obtaining history from patient or surrogate, review of old charts, re-evaluation of patient's condition, pulse  oximetry, ordering and review of radiographic studies, ordering and review of laboratory studies and ordering and performing treatments and interventions   I assumed direction of critical care for this patient from another provider in my specialty: no     Care discussed with: admitting provider       Medications Ordered in ED Medications  meropenem (MERREM) 1 g in sodium chloride 0.9 % 100 mL IVPB (has no administration in time range)  lactated ringers bolus 1,000 mL (1,000 mLs Intravenous New Bag/Given 03/29/23 1409)    ED Course/ Medical Decision Making/ A&P                                 Medical Decision Making Amount and/or Complexity of Data Reviewed Labs: ordered.  Risk Decision regarding hospitalization.   [name] 68 y.o. presented today for flank pain. Working DDx that I considered at this time includes, but not limited to, MSK, nephrolithiasis, pyelonephritis, AAA, aortic dissection, mesenteric ischemia, RCC, obstructive uropathy, renal infarct/hemorrhage, tumor, biliary colic, pancreatitis, appendicitis, SBO, diverticulitis, shingles, lower lobe pneumonia, ectopic pregnancy, PID/TOA, ovarian torsion, endometriosis.  R/o DDx: MSK, AAA, aortic dissection, mesenteric ischemia, RCC, renal infarct/hemorrhage, tumor, biliary colic, pancreatitis, appendicitis, SBO, diverticulitis, shingles, lower lobe pneumonia, ectopic pregnancy, PID/TOA, ovarian torsion, endometriosis: These are considered less likely due to history of present illness, physical exam, lab/imaging findings  Review of prior external notes: 03/29/2023 orders only  Unique Tests and My Independent Interpretation:  CBC: Unremarkable CMP: Slightly worsening creatinine 1.45, GFR 39 UA: Pyuria Lactic acid: Unremarkable Urine culture: Pending  Social Determinants of Health: none  Discussion with Independent Historian:  Daughter  Discussion of Management of Tests:  [place], NP Urology; Bufford Buttner,  [facility]  Risk: High: hospitalization or escalation of hospital-level care  Risk Stratification Score: none  EMERGENCY DEPARTMENT US RENAL EXAM  "Study: Limited Retroperitoneal Ultrasound of Kidneys"  INDICATIONS: Flank pain Long and short axis of both kidneys were obtained.   PERFORMED BY: Myself  IMAGES ARCHIVED?: Yes LIMITATIONS: Body habitus VIEWS USED: Long axis and Short axis  INTERPRETATION: Left  Hydronephrosis mild  Plan: On exam patient was no acute distress stable vitals. Physical exam showed left CVA tenderness.  Bedside echo does show mild hydronephrosis on the left side consistent with CT scan from earlier this month.  Patient endorsing any infectious symptoms so we will get labs.  Upon chart review it appears patient grew Klebsiella on the urine culture that is highly resistant.  Will give rest of labs and consult urology.  I spoke to urology as patient labs came back with slightly worsening creatinine and do show pyuria and they state that they will speak with the attending to figure out if patient will need to be watched overnight or needs to go to the OR to have a stent placed.  I called the pharmacist and they will call back to figure out what antibiotics we can give the patient as [name] does have a significant amount of allergies and the urine did grow Klebsiella that also has a significant amount of resistance to multiple antibiotics.  Pharmacy recommended meropenem and so this was ordered.  I spoke to the urology PA and they were able to find an OR space for the patient we will send her to the OR to have a stent placed.  On-call urology will come and evaluate the patient and have the patient sent up to the OR.  At this time patient stable to go to the OR.  Patient's not eating all day.  This chart was dictated using voice recognition software.  Despite best efforts to proofread,  errors can occur which can change the documentation meaning.         Final Clinical  Impression(s) / ED Diagnoses Final diagnoses:  Nephrolithiasis  Acute cystitis with hematuria    Rx / DC Orders ED Discharge Orders     None         Remi Deter 03/29/23 1427    Tegeler, Canary Brim, MD 03/29/23 [phone]

## 2023-03-29 NOTE — Assessment & Plan Note (Signed)
 Lab Results  Component Value Date   CREATININE 1.45 (H) 03/29/2023   CREATININE 0.88 01/16/2022   CREATININE 0.79 05/08/2021  Cont with MIVF.  Renally dose needee

## 2023-03-29 NOTE — Discharge Instructions (Addendum)
 Be sure to follow-up with Dr. Margo Aye or Dr. Pete Glatter for stent/stone removal [facility] [place] [phone]    Follow with Primary MD Mila Palmer, MD in 7 days   Get CBC, CMP, 2 view Chest X ray -  checked next visit with your primary MD    Activity: As tolerated with Full fall precautions use walker/cane & assistance as needed  Disposition Home    Diet: Heart Healthy low carbohydrate diet, check CBGs q. ACH S.  Special Instructions: If you have smoked or chewed Tobacco  in the last 2 yrs please stop smoking, stop any regular Alcohol  and or any Recreational drug use.  On your next visit with your primary care physician please Get Medicines reviewed and adjusted.  Please request your Prim.MD to go over all Hospital Tests and Procedure/Radiological results at the follow up, please get all Hospital records sent to your Prim MD by signing hospital release before you go home.  If you experience worsening of your admission symptoms, develop shortness of breath, life threatening emergency, suicidal or homicidal thoughts you must seek medical attention immediately by calling 911 or calling your MD immediately  if symptoms less severe.  You Must read complete instructions/literature along with all the possible adverse reactions/side effects for all the Medicines you take and that have been prescribed to you. Take any new Medicines after you have completely understood and accpet all the possible adverse reactions/side effects.   Do not drive when taking Pain medications.  Do not take more than prescribed Pain, Sleep and Anxiety Medications  Wear Seat belts while driving.

## 2023-03-29 NOTE — Transfer of Care (Signed)
 Immediate Anesthesia Transfer of Care Note  Patient: [name]  Procedure(s) Performed: CYSTOSCOPY WITH RETROGRADE PYELOGRAM/URETERAL STENT PLACEMENT (Left: Ureter)  Patient Location: PACU  Anesthesia Type:General  Level of Consciousness: drowsy and patient cooperative  Airway & Oxygen Therapy: Patient Spontanous Breathing and Patient connected to face mask  Post-op Assessment: Report given to RN and Post -op Vital signs reviewed and stable  Post vital signs: Reviewed and stable  Last Vitals:  Vitals Value Taken Time  BP 117/62 03/29/23 1750  Temp 36.5 C 03/29/23 1750  Pulse 67 03/29/23 1759  Resp 18 03/29/23 1759  SpO2 96 % 03/29/23 1759  Vitals shown include unfiled device data.  Last Pain:  Vitals:   03/29/23 1750  TempSrc:   PainSc: Asleep      Patients Stated Pain Goal: 3 (03/29/23 1551)  Complications: No notable events documented.

## 2023-03-29 NOTE — H&P (Signed)
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 to Hypoglycemia Protocol Sidebar Report for treatment of CBG < 70 mg/dl   No HS correction Insulin   Full code   Consult to urology   Consult to hospitalist Consult Timeframe: ROUTINE - requires response within 24 hours; Reason for Consult? MDR UTI Thank you! I will place a stent and have her f/u with Dr. Margo Aye for definitive stone management.   Meropenem per pharmacy consult         Oxygen therapy Mode or (Route): Nasal cannula; Liters Per Minute: 2; Keep O2 saturation between: greater than 92 %   I-Stat Lactic Acid, ED   No blood products   Insert saline lock   Admit to Inpatient (patient's expected length of stay will be greater than 2 midnights or inpatient only procedure)

## 2023-03-29 NOTE — Progress Notes (Addendum)
 Pt is a Jehovah's Witness. Blood refusal signed and copy sent to blood bank. Anesthesia and surgeon aware.  Addendum: Pharmacy called to complete med rec.

## 2023-03-29 NOTE — Anesthesia Preprocedure Evaluation (Addendum)
 Anesthesia Evaluation  Patient identified by MRN, date of birth, ID band Patient awake    Reviewed: Allergy & Precautions, NPO status , Patient's Chart, lab work & pertinent test results  History of Anesthesia Complications Negative for: history of anesthetic complications  Airway Mallampati: I  TM Distance: >3 FB Neck ROM: Full   Comment: Previous grade I view with MAC 3, easy mask Dental  (+) Edentulous Upper, Edentulous Lower   Pulmonary neg shortness of breath, asthma (does not use inhalers) , sleep apnea (noncompliant with CPAP) , neg COPD, neg recent URI, former smoker Pulmonary nodules, ILD   Pulmonary exam normal breath sounds clear to auscultation       Cardiovascular hypertension (amlodipine, atenolol), Pt. on medications (-) angina + CAD and + Peripheral Vascular Disease  (-) Past MI, (-) Cardiac Stents and (-) CABG + dysrhythmias (prolonged QT)  Rhythm:Regular Rate:Normal  Carotid artery occlusion  TTE 06/21/2020: IMPRESSIONS    1. Left ventricular ejection fraction, by estimation, is 55 to 60%. The  left ventricle has normal function. The left ventricle has no regional  wall motion abnormalities. There is mild concentric left ventricular  hypertrophy. Left ventricular diastolic  parameters are consistent with Grade I diastolic dysfunction (impaired  relaxation).   2. Right ventricular systolic function is normal. The right ventricular  size is normal.   3. The mitral valve is grossly normal. No evidence of mitral valve  regurgitation. No evidence of mitral stenosis.   4. The aortic valve is tricuspid. There is mild thickening of the aortic  valve. Aortic valve regurgitation is not visualized. No aortic stenosis is  present.   5. The inferior vena cava is normal in size with greater than 50%  respiratory variability, suggesting right atrial pressure of 3 mmHg.   Low-risk stress test 03/27/2017    Neuro/Psych   Headaches, neg Seizures PSYCHIATRIC DISORDERS Anxiety     CVA (11/2012), No Residual Symptoms    GI/Hepatic ,GERD  ,,Hepatic steatosis   Endo/Other  diabetes, Type 2, Insulin DependentHypothyroidism    Renal/GU Renal disease (stones)     Musculoskeletal  (+) Arthritis ,    Abdominal  (+) + obese  Peds  Hematology  (+) REFUSES BLOOD PRODUCTS (refuses albumin as well), JEHOVAH'S WITNESSLab Results      Component                Value               Date                      WBC                      5.6                 03/29/2023                HGB                      15.5 (H)            03/29/2023                HCT                      47.0 (H)            03/29/2023  MCV                      81.7                03/29/2023                PLT                      274                 03/29/2023              Anesthesia Other Findings Last Mounjaro: 03/23/2023  Reproductive/Obstetrics                             Anesthesia Physical Anesthesia Plan  ASA: 3  Anesthesia Plan: General   Post-op Pain Management:    Induction: Intravenous and Rapid sequence  PONV Risk Score and Plan: 3 and Ondansetron, Dexamethasone and Treatment may vary due to age or medical condition  Airway Management Planned: Oral ETT  Additional Equipment:   Intra-op Plan:   Post-operative Plan: Extubation in OR  Informed Consent: I have reviewed the patients History and Physical, chart, labs and discussed the procedure including the risks, benefits and alternatives for the proposed anesthesia with the patient or authorized representative who has indicated his/her understanding and acceptance.     Dental advisory given  Plan Discussed with: CRNA and Anesthesiologist  Anesthesia Plan Comments: (Risks of general anesthesia discussed including, but not limited to, sore throat, hoarse voice, chipped/damaged teeth, injury to vocal cords, nausea and vomiting, allergic  reactions, lung infection, heart attack, stroke, and death. All questions answered. )        Anesthesia Quick Evaluation

## 2023-03-29 NOTE — Assessment & Plan Note (Signed)
 Patient found to have hydronephrosis with ureteral obstruction taken to the OR immediately and underwent cystoscopy and  stent placement. Urology Dr. Mena Goes following patient. Continue with IV antibiotics and aggressive IV fluid hydration. Supportive care with pain meds.

## 2023-03-29 NOTE — Progress Notes (Signed)
 Pharmacy Antibiotic Note  [name] is a 68 y.o. female admitted on 03/29/2023 with UTI.  Pharmacy has been consulted for merrem dosing.  Pt with recurrent cUTI with ESBL klebsiella. She is s/p cystoscopy with left retrograde pyelogram, left ureteral stent placement today. Merrem ordered.  Scr 1.45  Plan: Merrem 1g IV q12 F/u cultures  Height: 5\' 5"  (165.1 cm) Weight: 98.4 kg (217 lb) IBW/kg (Calculated) : 57  Temp (24hrs), Avg:97.7 F (36.5 C), Min:97.4 F (36.3 C), Max:97.8 F (36.6 C)  Recent Labs  Lab 03/29/23 1213 03/29/23 1233  WBC 5.6  --   CREATININE 1.45*  --   LATICACIDVEN  --  1.3    Estimated Creatinine Clearance: 43.1 mL/min (A) (by C-G formula based on SCr of 1.45 mg/dL (H)).    Allergies  Allergen Reactions   Strawberry Extract Anaphylaxis   Cefdinir Other (See Comments)   Oxycodone Hcl Nausea And Vomiting   Sulfamethoxazole-Trimethoprim Rash   Topiramate Other (See Comments)    GI Upset, Dizzy, HA, Blurred Vision   Cymbalta [Duloxetine Hcl] Other (See Comments)    Dizzy    Lisinopril Itching, Swelling and Other (See Comments)    Angioedema    Lunesta [Eszopiclone] Other (See Comments)    Bad taste in mouth    Metformin And Related Other (See Comments)    Damaged patient's kidneys   Other Other (See Comments)    Pt is a Jehovah Witness. No blood products.   Sulfa Antibiotics Itching, Swelling and Other (See Comments)    Angioedema    Tramadol Other (See Comments)    Hallucinations    Trazodone And Nefazodone Other (See Comments)    Shakes    Penicillins Rash and Other (See Comments)    "Paralyzed" (per patient) Has patient had a PCN reaction causing immediate rash, facial/tongue/throat swelling, SOB or lightheadedness with hypotension: Yes Has patient had a PCN reaction causing severe rash involving mucus membranes or skin necrosis: No Has patient had a PCN reaction that required hospitalization Yes Has patient had a PCN reaction  occurring within the last 10 years: No If all of the above answers are "NO", then may proceed with Cephalosporin use.     Antimicrobials this admission: 2/21 merrem>>  Dose adjustments this admission:   Microbiology results: 2/21 urine>> 1/29 urine>>ESBL kleb oxytoca pan resistant except augm, gent, imi, nitrofurantoin  Ulyses Southward, PharmD, [place], AAHIVP, CPP Infectious Disease Pharmacist 03/29/2023 7:56 PM

## 2023-03-29 NOTE — ED Triage Notes (Signed)
 Patient sent by urology for concerns of sepsis following CT results showing a kidney stone. Patient has been treated for multiple UTI recently and been on different antibiotics which have not been effective. Patient is A&Ox4, no flank pain or UTI symptoms reported.

## 2023-03-30 DIAGNOSIS — N132 Hydronephrosis with renal and ureteral calculous obstruction: Secondary | ICD-10-CM | POA: Diagnosis not present

## 2023-03-30 LAB — CBC
HCT: 41.2 % (ref 36.0–46.0)
Hemoglobin: 13.5 g/dL (ref 12.0–15.0)
MCH: 26.4 pg (ref 26.0–34.0)
MCHC: 32.8 g/dL (ref 30.0–36.0)
MCV: 80.6 fL (ref 80.0–100.0)
Platelets: 248 10*3/uL (ref 150–400)
RBC: 5.11 MIL/uL (ref 3.87–5.11)
RDW: 14.1 % (ref 11.5–15.5)
WBC: 5.6 10*3/uL (ref 4.0–10.5)
nRBC: 0 % (ref 0.0–0.2)

## 2023-03-30 LAB — COMPREHENSIVE METABOLIC PANEL
ALT: 12 U/L (ref 0–44)
AST: 17 U/L (ref 15–41)
Albumin: 2.9 g/dL — ABNORMAL LOW (ref 3.5–5.0)
Alkaline Phosphatase: 58 U/L (ref 38–126)
Anion gap: 12 (ref 5–15)
BUN: 14 mg/dL (ref 8–23)
CO2: 19 mmol/L — ABNORMAL LOW (ref 22–32)
Calcium: 9 mg/dL (ref 8.9–10.3)
Chloride: 102 mmol/L (ref 98–111)
Creatinine, Ser: 1.59 mg/dL — ABNORMAL HIGH (ref 0.44–1.00)
GFR, Estimated: 35 mL/min — ABNORMAL LOW (ref >60)
Glucose, Bld: 292 mg/dL — ABNORMAL HIGH (ref 70–99)
Potassium: 4.5 mmol/L (ref 3.5–5.1)
Sodium: 133 mmol/L — ABNORMAL LOW (ref 135–145)
Total Bilirubin: 0.5 mg/dL (ref 0.0–1.2)
Total Protein: 7.9 g/dL (ref 6.5–8.1)

## 2023-03-30 LAB — HIV ANTIBODY (ROUTINE TESTING W REFLEX): HIV Screen 4th Generation wRfx: NONREACTIVE

## 2023-03-30 LAB — GLUCOSE, CAPILLARY
Glucose-Capillary: 354 mg/dL — ABNORMAL HIGH (ref 70–99)
Glucose-Capillary: 332 mg/dL — ABNORMAL HIGH (ref 70–99)
Glucose-Capillary: 343 mg/dL — ABNORMAL HIGH (ref 70–99)
Glucose-Capillary: 259 mg/dL — ABNORMAL HIGH (ref 70–99)

## 2023-03-30 MED ORDER — HYDROCODONE-ACETAMINOPHEN 5-325 MG PO TABS
1.0000 | ORAL_TABLET | ORAL | Status: DC | PRN
  Administered 2023-03-30 – 2023-04-01 (×3): 1 via ORAL
  Filled 2023-03-30 (×3): qty 1

## 2023-03-30 MED ORDER — CILOSTAZOL 100 MG PO TABS
100.0000 mg | ORAL_TABLET | Freq: Two times a day (BID) | ORAL | Status: DC
  Administered 2023-03-30 – 2023-04-01 (×5): 100 mg via ORAL
  Filled 2023-03-30 (×8): qty 1

## 2023-03-30 MED ORDER — PANTOPRAZOLE SODIUM 40 MG PO TBEC: 40.0000 mg | DELAYED_RELEASE_TABLET | Freq: Every day | ORAL | Status: DC | PRN

## 2023-03-30 MED ORDER — LACTATED RINGERS IV SOLN: INTRAVENOUS | Status: AC

## 2023-03-30 MED ORDER — DICYCLOMINE HCL 10 MG PO CAPS
10.0000 mg | ORAL_CAPSULE | Freq: Three times a day (TID) | ORAL | Status: DC
  Administered 2023-03-30 – 2023-04-01 (×6): 10 mg via ORAL
  Filled 2023-03-30 (×9): qty 1

## 2023-03-30 MED ORDER — HYDROMORPHONE HCL 1 MG/ML IJ SOLN
0.5000 mg | Freq: Once | INTRAMUSCULAR | Status: AC
  Administered 2023-03-30: 0.5 mg via INTRAVENOUS
  Filled 2023-03-30: qty 0.5

## 2023-03-30 MED ORDER — ROSUVASTATIN CALCIUM 20 MG PO TABS
40.0000 mg | ORAL_TABLET | Freq: Every day | ORAL | Status: DC
  Administered 2023-03-30 – 2023-04-01 (×3): 40 mg via ORAL
  Filled 2023-03-30 (×3): qty 2

## 2023-03-30 MED ORDER — INSULIN ASPART PROT & ASPART (70-30 MIX) 100 UNIT/ML ~~LOC~~ SUSP
20.0000 [IU] | Freq: Two times a day (BID) | SUBCUTANEOUS | Status: DC
  Administered 2023-03-30: 20 [IU] via SUBCUTANEOUS
  Filled 2023-03-30: qty 10

## 2023-03-30 MED ORDER — ATENOLOL 50 MG PO TABS
25.0000 mg | ORAL_TABLET | Freq: Every day | ORAL | Status: DC
  Administered 2023-03-30 – 2023-04-01 (×3): 25 mg via ORAL
  Filled 2023-03-30 (×3): qty 1

## 2023-03-30 MED ORDER — ASPIRIN 81 MG PO TBEC
81.0000 mg | DELAYED_RELEASE_TABLET | Freq: Every day | ORAL | Status: DC
  Administered 2023-03-30 – 2023-04-01 (×3): 81 mg via ORAL
  Filled 2023-03-30 (×3): qty 1

## 2023-03-30 NOTE — Anesthesia Postprocedure Evaluation (Signed)
 Anesthesia Post Note  Patient: [name]  Procedure(s) Performed: CYSTOSCOPY WITH RETROGRADE PYELOGRAM/URETERAL STENT PLACEMENT (Left: Ureter)     Patient location during evaluation: PACU Anesthesia Type: General Level of consciousness: awake Pain management: pain level controlled Vital Signs Assessment: post-procedure vital signs reviewed and stable Respiratory status: spontaneous breathing, nonlabored ventilation and respiratory function stable Cardiovascular status: blood pressure returned to baseline and stable Postop Assessment: no apparent nausea or vomiting Anesthetic complications: no   No notable events documented.  Last Vitals:  Vitals:   03/30/23 0833 03/30/23 1254  BP: 104/68 119/64  Pulse: 77 78  Resp:  18  Temp: (!) 36.3 C 36.5 C  SpO2: 93% 94%    Last Pain:  Vitals:   03/30/23 1254  TempSrc: Oral  PainSc:                  Linton Rump

## 2023-03-30 NOTE — Progress Notes (Signed)
 PROGRESS NOTE                                                                                                                                                                                                             Patient Demographics:    [name], is a 68 y.o. female, DOB - 06/28/1955, ZOX:[id number]  Outpatient Primary MD for the patient is Mila Palmer, MD    LOS - 1  Admit date - 03/29/2023    Chief Complaint  Patient presents with   Kidney Stone       Brief Narrative (HPI from H&P)   68 y.o. female with past medical history  of allergies to 14 medications please see complete last, history recurrent UTI multidrug-resistant Klebsiella has been on Bactrim and fosphenytoin was seen by ID and referred to urology where patient had a CT of the abdomen showing a 5 mm left proximal stone with hydronephrosis. Admission requested  for medical issues which include her kidney disease and heart disease and diabetes.  Patient on arrival to the ED was taken to the OR urgently, received left-sided ureteral stent by urology, admitted for further care.    Subjective:    [name] today has, No headache, No chest pain, No abdominal pain - No Nausea, No new weakness tingling or numbness, no SOB   Assessment  & Plan :    Hydronephrosis with urinary obstruction due to renal calculus with AKI and possible UTI.  Present on admission Patient found to have hydronephrosis with ureteral obstruction taken to the OR immediately and underwent cystoscopy and  stent placement.  By urologist Dr. Mena Goes on 03/29/2023, continue gentle IV fluids, still has AKI, continue to monitor renal function, continue IV antibiotic which is empiric, follow urine culture results.  Note she has underlying history of multidrug-resistant UTIs in the past.  Urology Dr. Mena Goes following patient. Continue with IV antibiotics and aggressive IV fluid  hydration. Supportive care with pain meds.  AKI (acute kidney injury) (HCC)   See above  Hypothyroidism.  On Synthroid.    Hypertension.  Low-dose beta-blocker and monitor  Dyslipidemia.  Home dose statin.    GERD.  PPI.    Obesity.  BMI 36.  Follow-up with PCP.  Diabetes (HCC) Sliding scale.   CBG (last 3)  Recent Labs    03/29/23 1559  03/29/23 1752 03/30/23 0834  GLUCAP 87 106* 354*          Condition - Fair  Family Communication  :   None  Code Status :  Full  Consults  :  Urology  PUD Prophylaxis :    Procedures  :     03/29/2023 by Dr. Jerilee Field urology.  Intraoperative fluoroscopic assistance provided during left ureteral stent placement       Disposition Plan  :    Status is: Inpatient  DVT Prophylaxis  :    heparin injection 5,000 Units Start: 03/30/23 1000 SCDs Start: 03/29/23 1607    Lab Results  Component Value Date   PLT 248 03/30/2023    Diet :  Diet Order             Diet Carb Modified Fluid consistency: Thin; Room service appropriate? Yes  Diet effective now                    Inpatient Medications  Scheduled Meds:  acidophilus  1 capsule Oral Daily   aspirin EC  81 mg Oral Daily   atenolol  25 mg Oral Daily   cilostazol  100 mg Oral BID   dicyclomine  10 mg Oral TID AC   heparin  5,000 Units Subcutaneous Q12H   insulin aspart  0-15 Units Subcutaneous TID WC   levothyroxine  100 mcg Oral QAC breakfast   rosuvastatin  40 mg Oral Daily   Continuous Infusions:  lactated ringers     meropenem (MERREM) IV Stopped (03/30/23 0200)   PRN Meds:.acetaminophen **OR** acetaminophen, bisacodyl, ondansetron **OR** ondansetron (ZOFRAN) IV, pantoprazole   Objective:   Vitals:   03/29/23 2132 03/30/23 0044 03/30/23 0350 03/30/23 0833  BP: 127/65 123/70 120/64 104/68  Pulse: 72   77  Resp:  18    Temp: (!) 97.2 F (36.2 C) (!) 97.2 F (36.2 C) (!) 97.4 F (36.3 C) (!) 97.3 F (36.3 C)  TempSrc: Oral Oral Oral  Oral  SpO2: 94%   93%  Weight:      Height:        Wt Readings from Last 3 Encounters:  03/29/23 98.4 kg  03/22/23 99.1 kg  03/06/23 101.6 kg     Intake/Output Summary (Last 24 hours) at 03/30/2023 1009 Last data filed at 03/30/2023 0720 Gross per 24 hour  Intake 1040 ml  Output 1 ml  Net 1039 ml     Physical Exam  Awake Alert, No new F.N deficits, Normal affect [place].AT,PERRAL Supple Neck, No JVD,   Symmetrical Chest wall movement, Good air movement bilaterally, CTAB RRR,No Gallops,Rubs or new Murmurs,  +ve B.Sounds, Abd Soft, No tenderness,   No Cyanosis, Clubbing or edema        Data Review:    Recent Labs  Lab 03/29/23 1213 03/30/23 0548  WBC 5.6 5.6  HGB 15.5* 13.5  HCT 47.0* 41.2  PLT 274 248  MCV 81.7 80.6  MCH 27.0 26.4  MCHC 33.0 32.8  RDW 14.3 14.1  LYMPHSABS 2.0  --   MONOABS 0.4  --   EOSABS 0.3  --   BASOSABS 0.0  --     Recent Labs  Lab 03/29/23 1213 03/29/23 1233 03/29/23 2101 03/30/23 0548  NA 136  --   --  133*  K 3.7  --   --  4.5  CL 108  --   --  102  CO2 18*  --   --  19*  ANIONGAP 10  --   --  12  GLUCOSE 142*  --   --  292*  BUN 11  --   --  14  CREATININE 1.45*  --   --  1.59*  AST 21  --   --  17  ALT 14  --   --  12  ALKPHOS 75  --   --  58  BILITOT 0.7  --   --  0.5  ALBUMIN 3.4*  --   --  2.9*  LATICACIDVEN  --  1.3  --   --   HGBA1C  --   --  7.5*  --   CALCIUM 9.6  --   --  9.0      Recent Labs  Lab 03/29/23 1213 03/29/23 1233 03/29/23 2101 03/30/23 0548  LATICACIDVEN  --  1.3  --   --   HGBA1C  --   --  7.5*  --   CALCIUM 9.6  --   --  9.0    --------------------------------------------------------------------------------------------------------------- Lab Results  Component Value Date   CHOL 125 06/21/2020   HDL 47 06/21/2020   LDLCALC 61 06/21/2020   TRIG 83 06/21/2020   CHOLHDL 2.7 06/21/2020    Lab Results  Component Value Date   HGBA1C 7.5 (H) 03/29/2023   No results for input(s):  "TSH", "T4TOTAL", "FREET4", "T3FREE", "THYROIDAB" in the last 72 hours. No results for input(s): "VITAMINB12", "FOLATE", "FERRITIN", "TIBC", "IRON", "RETICCTPCT" in the last 72 hours. ------------------------------------------------------------------------------------------------------------------ Cardiac Enzymes No results for input(s): "CKMB", "TROPONINI", "MYOGLOBIN" in the last 168 hours.  Invalid input(s): "CK"  Micro Results No results found for this or any previous visit (from the past 240 hours).  Radiology Report DG C-Arm 1-60 Min Result Date: 03/29/2023 CLINICAL DATA:  Cystography EXAM: DG C-ARM 1-60 MIN FLUOROSCOPY: Fluoroscopy Time:  43 seconds Radiation Exposure Index (if provided by the fluoroscopic device): 9.46 mGy Number of Acquired Spot Images: 3 COMPARISON:  CT 03/15/2023 FINDINGS: Three low resolution intraoperative spot views of the abdomen. Images demonstrate filling defect within the proximal to mid left ureter corresponding to history of stone. There is hydronephrosis and hydroureter upstream to the stone. Subsequent wire access of left renal collecting system with placement of stent, the distal portion of the stent is nonvisualized. The proximal aspect of the stent overlies the left renal collecting system IMPRESSION: Intraoperative fluoroscopic assistance provided during left ureteral stent placement Electronically Signed   By: Jasmine Pang M.D.   On: 03/29/2023 18:21     Signature  -   Susa Raring M.D on 03/30/2023 at 10:09 AM   -  To page go to [email or web address]

## 2023-03-30 NOTE — Evaluation (Signed)
 Physical Therapy Evaluation and Discharge Patient Details Name: [name] MRN: [id number] DOB: 1955/09/12 Today's Date: 03/30/2023  History of Present Illness  68 y.o. female admitted 2/21 with CT of the abdomen showing a 5 mm left proximal stone with hydronephrosis. S/p Cystoscopy with left retrograde pyelogram, left ureteral stent placement 2/21.  PMH: allergies to 14 medications, history recurrent UTI multidrug-resistant Klebsiella,  CAD, arthritis, DM, HTN, PAD, SBO, CVA.  Clinical Impression  Patient evaluated by Physical Therapy with no further acute PT needs identified. All education has been completed and the patient has no further questions. Mod I at baseline, uses SPC to ambulate, denies any falls recently, family (2 sons) can check on her as needed, handicap accessible home.  Ambulates 300 feet today without assistive device, no signs of LOB. DGI 22/26 indicating low fall risk. SpO2 90% on room air while ambulating, denies SOB. Up to 95% while seated at rest. See below for any follow-up Physical Therapy or equipment needs. PT is signing off. Thank you for this referral.         If plan is discharge home, recommend the following: Assist for transportation     Equipment Recommendations None recommended by PT     Functional Status Assessment Patient has not had a recent decline in their functional status     Precautions / Restrictions Precautions Precautions: None Recall of Precautions/Restrictions: Intact Restrictions Weight Bearing Restrictions Per Provider Order: No      Mobility  Bed Mobility Overal bed mobility: Independent             General bed mobility comments: No assist needed.    Transfers Overall transfer level: Modified independent Equipment used: None               General transfer comment: Stable upon rising. No assist required. Performed from bed. Able to sit with good control into recliner.    Ambulation/Gait Ambulation/Gait  assistance: Modified independent (Device/Increase time) Gait Distance (Feet): 300 Feet Assistive device: None Gait Pattern/deviations: Step-through pattern, Wide base of support, Drifts right/left Gait velocity: wfl Gait velocity interpretation: >2.62 ft/sec, indicative of community ambulatory   General Gait Details: Minor drift at times but able to self correct. Cues for awareness and safety. No overt LOB or buckling. Tolerated dynamic gait tasks without LOB. Good pace. RLE externally rotated but likely baseline. SpO2 90% on RA during bout, no DOE. Denies feeling SOB.  Stairs            Wheelchair Mobility     Tilt Bed    Modified Rankin (Stroke Patients Only)       Balance Overall balance assessment: Mild deficits observed, not formally tested                               Standardized Balance Assessment Standardized Balance Assessment : Dynamic Gait Index   Dynamic Gait Index Level Surface: Normal Change in Gait Speed: Mild Impairment Gait with Horizontal Head Turns: Normal Gait with Vertical Head Turns: Normal Gait and Pivot Turn: Normal Step Over Obstacle: Normal Step Around Obstacles: Normal Steps: Mild Impairment Total Score: 22       Pertinent Vitals/Pain Pain Assessment Pain Assessment: No/denies pain    Home Living Family/patient expects to be discharged to:: Private residence Living Arrangements: Alone Available Help at Discharge: Family;Available PRN/intermittently (2 children) Type of Home: House Home Access: Ramped entrance       Home Layout: One  level Home Equipment: Rollator (4 wheels);Cane - single point;Wheelchair - manual      Prior Function Prior Level of Function : Independent/Modified Independent             Mobility Comments: Ambulates with cane, denies recent falls. ADLs Comments: Ind with ADLs, states she does not drive.     Extremity/Trunk Assessment   Upper Extremity Assessment Upper Extremity  Assessment: Defer to OT evaluation    Lower Extremity Assessment Lower Extremity Assessment: Overall WFL for tasks assessed       Communication   Communication Communication: No apparent difficulties    Cognition Arousal: Alert Behavior During Therapy: WFL for tasks assessed/performed   PT - Cognitive impairments: No apparent impairments                         Following commands: Intact       Cueing Cueing Techniques: Verbal cues     General Comments General comments (skin integrity, edema, etc.): SpO2 90% while ambulating on RA, denies SOB. After ambulating sats improved to 95% at rest.    Exercises     Assessment/Plan    PT Assessment Patient does not need any further PT services  PT Problem List Decreased activity tolerance;Decreased mobility;Cardiopulmonary status limiting activity;Pain       PT Treatment Interventions Gait training;Functional mobility training;Patient/family education    PT Goals (Current goals can be found in the Care Plan section)  Acute Rehab PT Goals Patient Stated Goal: Go home PT Goal Formulation: All assessment and education complete, DC therapy    Frequency       Co-evaluation               AM-PAC PT "6 Clicks" Mobility  Outcome Measure Help needed turning from your back to your side while in a flat bed without using bedrails?: None Help needed moving from lying on your back to sitting on the side of a flat bed without using bedrails?: None Help needed moving to and from a bed to a chair (including a wheelchair)?: None Help needed standing up from a chair using your arms (e.g., wheelchair or bedside chair)?: None Help needed to walk in hospital room?: None Help needed climbing 3-5 steps with a railing? : A Little 6 Click Score: 23    End of Session   Activity Tolerance: Patient tolerated treatment well Patient left: in chair;with call bell/phone within reach;with chair alarm set;with SCD's reapplied Nurse  Communication: Mobility status PT Visit Diagnosis: Other abnormalities of gait and mobility (R26.89);Pain Pain - part of body:  (flank (resolved))    Time: 7846-9629 PT Time Calculation (min) (ACUTE ONLY): 18 min   Charges:   PT Evaluation $PT Eval Low Complexity: 1 Low   PT General Charges $$ ACUTE PT VISIT: 1 Visit         Kathlyn Sacramento, PT, DPT [facility]  Rehabilitation Services Physical Therapist Office: [phone] Website: [email or web address]   Berton Mount 03/30/2023, 10:32 AM

## 2023-03-31 ENCOUNTER — Encounter (HOSPITAL_COMMUNITY): Payer: Self-pay | Admitting: Urology

## 2023-03-31 DIAGNOSIS — N132 Hydronephrosis with renal and ureteral calculous obstruction: Secondary | ICD-10-CM | POA: Diagnosis not present

## 2023-03-31 LAB — COMPREHENSIVE METABOLIC PANEL
ALT: 15 U/L (ref 0–44)
AST: 20 U/L (ref 15–41)
Albumin: 3 g/dL — ABNORMAL LOW (ref 3.5–5.0)
Alkaline Phosphatase: 64 U/L (ref 38–126)
Anion gap: 14 (ref 5–15)
BUN: 15 mg/dL (ref 8–23)
CO2: 19 mmol/L — ABNORMAL LOW (ref 22–32)
Calcium: 9.3 mg/dL (ref 8.9–10.3)
Chloride: 102 mmol/L (ref 98–111)
Creatinine, Ser: 1.3 mg/dL — ABNORMAL HIGH (ref 0.44–1.00)
GFR, Estimated: 45 mL/min — ABNORMAL LOW (ref >60)
Glucose, Bld: 201 mg/dL — ABNORMAL HIGH (ref 70–99)
Potassium: 3.9 mmol/L (ref 3.5–5.1)
Sodium: 135 mmol/L (ref 135–145)
Total Bilirubin: 0.4 mg/dL (ref 0.0–1.2)
Total Protein: 7.5 g/dL (ref 6.5–8.1)

## 2023-03-31 LAB — CBC WITH DIFFERENTIAL/PLATELET
Abs Immature Granulocytes: 0.02 10*3/uL (ref 0.00–0.07)
Basophils Absolute: 0 10*3/uL (ref 0.0–0.1)
Basophils Relative: 0 %
Eosinophils Absolute: 0 10*3/uL (ref 0.0–0.5)
Eosinophils Relative: 0 %
HCT: 40.2 % (ref 36.0–46.0)
Hemoglobin: 13.5 g/dL (ref 12.0–15.0)
Immature Granulocytes: 0 %
Lymphocytes Relative: 22 %
Lymphs Abs: 2.2 10*3/uL (ref 0.7–4.0)
MCH: 26.9 pg (ref 26.0–34.0)
MCHC: 33.6 g/dL (ref 30.0–36.0)
MCV: 80.2 fL (ref 80.0–100.0)
Monocytes Absolute: 0.6 10*3/uL (ref 0.1–1.0)
Monocytes Relative: 6 %
Neutro Abs: 7 10*3/uL (ref 1.7–7.7)
Neutrophils Relative %: 72 %
Platelets: 265 10*3/uL (ref 150–400)
RBC: 5.01 MIL/uL (ref 3.87–5.11)
RDW: 14.2 % (ref 11.5–15.5)
WBC: 9.9 10*3/uL (ref 4.0–10.5)
nRBC: 0 % (ref 0.0–0.2)

## 2023-03-31 LAB — GLUCOSE, CAPILLARY
Glucose-Capillary: 199 mg/dL — ABNORMAL HIGH (ref 70–99)
Glucose-Capillary: 156 mg/dL — ABNORMAL HIGH (ref 70–99)
Glucose-Capillary: 141 mg/dL — ABNORMAL HIGH (ref 70–99)
Glucose-Capillary: 168 mg/dL — ABNORMAL HIGH (ref 70–99)

## 2023-03-31 LAB — MAGNESIUM: Magnesium: 1.9 mg/dL (ref 1.7–2.4)

## 2023-03-31 MED ORDER — INSULIN ASPART PROT & ASPART (70-30 MIX) 100 UNIT/ML ~~LOC~~ SUSP
30.0000 [IU] | Freq: Two times a day (BID) | SUBCUTANEOUS | Status: DC
  Administered 2023-03-31 – 2023-04-01 (×3): 30 [IU] via SUBCUTANEOUS

## 2023-03-31 MED ORDER — INSULIN ASPART 100 UNIT/ML IJ SOLN
0.0000 [IU] | Freq: Three times a day (TID) | INTRAMUSCULAR | Status: DC
  Administered 2023-03-31: 3 [IU] via SUBCUTANEOUS
  Administered 2023-03-31: 2 [IU] via SUBCUTANEOUS
  Administered 2023-03-31 – 2023-04-01 (×2): 3 [IU] via SUBCUTANEOUS

## 2023-03-31 MED ORDER — INSULIN ASPART 100 UNIT/ML IJ SOLN: 0.0000 [IU] | Freq: Every day | INTRAMUSCULAR | Status: DC

## 2023-03-31 NOTE — Progress Notes (Signed)
   03/31/23 1544  Mobility  Activity Ambulated independently in hallway  Level of Assistance Modified independent, requires aide device or extra time  Assistive Device None  Distance Ambulated (ft) 300 ft  Activity Response Tolerated fair  Mobility Referral Yes  Mobility visit 1 Mobility  Mobility Specialist Start Time (ACUTE ONLY) 1544  Mobility Specialist Stop Time (ACUTE ONLY) 1551  Mobility Specialist Time Calculation (min) (ACUTE ONLY) 7 min   Mobility Specialist: Progress Note  Pt agreeable to mobility session - received in bed. C/o BLE weakness in quads. Returned to standing in room with all needs met - call bell within reach.   Barnie Mort, BS Mobility Specialist Please contact via SecureChat or  Rehab office at [phone].

## 2023-03-31 NOTE — Progress Notes (Signed)
 PROGRESS NOTE                                                                                                                                                                                                             Patient Demographics:    [name], is a 68 y.o. female, DOB - 09-09-1955, XBM:[id number]  Outpatient Primary MD for the patient is Mila Palmer, MD    LOS - 2  Admit date - 03/29/2023    Chief Complaint  Patient presents with   Kidney Stone       Brief Narrative (HPI from H&P)   68 y.o. female with past medical history  of allergies to 14 medications please see complete last, history recurrent UTI multidrug-resistant Klebsiella has been on Bactrim and fosphenytoin was seen by ID and referred to urology where patient had a CT of the abdomen showing a 5 mm left proximal stone with hydronephrosis. Admission requested  for medical issues which include her kidney disease and heart disease and diabetes.  Patient on arrival to the ED was taken to the OR urgently, received left-sided ureteral stent by urology, admitted for further care.    Subjective:   Patient in bed, appears comfortable, denies any headache, no fever, no chest pain or pressure, no shortness of breath , no abdominal pain. No new focal weakness.   Assessment  & Plan :    Hydronephrosis with urinary obstruction due to renal calculus with AKI and possible UTI.  Present on admission Patient found to have hydronephrosis with ureteral obstruction taken to the OR immediately and underwent cystoscopy and  stent placement.  By urologist Dr. Mena Goes on 03/29/2023, continue gentle IV fluids, still has AKI, continue to monitor renal function, continue IV antibiotic which is empiric, follow urine culture results, discussed with micro lab on 03/31/2023 results likely in 1-2 more days.  Note she has underlying history of multidrug-resistant UTIs in the  past.  Urology Dr. Mena Goes following patient. Continue with IV antibiotics and aggressive IV fluid hydration. Supportive care with pain meds.   AKI (acute kidney injury) (HCC)   See above  Hypothyroidism.  On Synthroid.    Hypertension.  Low-dose beta-blocker and monitor  Dyslipidemia.  Home dose statin.    GERD.  PPI.    Obesity.  BMI 36.  Follow-up with PCP.  Diabetes (  HCC) Sliding scale.   CBG (last 3)  Recent Labs    03/30/23 1639 03/30/23 2150 03/31/23 0807  GLUCAP 343* 259* 199*          Condition - Fair  Family Communication  :   None  Code Status :  Full  Consults  :  Urology  PUD Prophylaxis :    Procedures  :     03/29/2023 by Dr. Jerilee Field urology.  Intraoperative fluoroscopic assistance provided during left ureteral stent placement       Disposition Plan  :    Status is: Inpatient  DVT Prophylaxis  :    heparin injection 5,000 Units Start: 03/30/23 1000 SCDs Start: 03/29/23 1607    Lab Results  Component Value Date   PLT 265 03/31/2023    Diet :  Diet Order             Diet Carb Modified Fluid consistency: Thin; Room service appropriate? Yes  Diet effective now                    Inpatient Medications  Scheduled Meds:  acidophilus  1 capsule Oral Daily   aspirin EC  81 mg Oral Daily   atenolol  25 mg Oral Daily   cilostazol  100 mg Oral BID   dicyclomine  10 mg Oral TID AC   heparin  5,000 Units Subcutaneous Q12H   insulin aspart  0-15 Units Subcutaneous TID WC   insulin aspart  0-5 Units Subcutaneous QHS   insulin aspart protamine- aspart  30 Units Subcutaneous BID WC   levothyroxine  100 mcg Oral QAC breakfast   rosuvastatin  40 mg Oral Daily   Continuous Infusions:  meropenem (MERREM) IV 1 g (03/31/23 0059)   PRN Meds:.acetaminophen **OR** acetaminophen, bisacodyl, HYDROcodone-acetaminophen, ondansetron **OR** ondansetron (ZOFRAN) IV, pantoprazole   Objective:   Vitals:   03/30/23 2013 03/31/23  0055 03/31/23 0545 03/31/23 0754  BP: 125/64 116/64 111/61 115/61  Pulse: 78 73    Resp:   18   Temp: 97.8 F (36.6 C) 97.6 F (36.4 C) 97.8 F (36.6 C)   TempSrc: Oral Oral Oral   SpO2: 92%  93%   Weight:      Height:        Wt Readings from Last 3 Encounters:  03/29/23 98.4 kg  03/22/23 99.1 kg  03/06/23 101.6 kg     Intake/Output Summary (Last 24 hours) at 03/31/2023 0848 Last data filed at 03/31/2023 0105 Gross per 24 hour  Intake 1790.04 ml  Output --  Net 1790.04 ml     Physical Exam  Awake Alert, No new F.N deficits, Normal affect [place].AT,PERRAL Supple Neck, No JVD,   Symmetrical Chest wall movement, Good air movement bilaterally, CTAB RRR,No Gallops,Rubs or new Murmurs,  +ve B.Sounds, Abd Soft, No tenderness,   No Cyanosis, Clubbing or edema        Data Review:    Recent Labs  Lab 03/29/23 1213 03/30/23 0548 03/31/23 0544  WBC 5.6 5.6 9.9  HGB 15.5* 13.5 13.5  HCT 47.0* 41.2 40.2  PLT 274 248 265  MCV 81.7 80.6 80.2  MCH 27.0 26.4 26.9  MCHC 33.0 32.8 33.6  RDW 14.3 14.1 14.2  LYMPHSABS 2.0  --  2.2  MONOABS 0.4  --  0.6  EOSABS 0.3  --  0.0  BASOSABS 0.0  --  0.0    Recent Labs  Lab 03/29/23 1213 03/29/23 1233 03/29/23 2101 03/30/23 0548  03/31/23 0544  NA 136  --   --  133* 135  K 3.7  --   --  4.5 3.9  CL 108  --   --  102 102  CO2 18*  --   --  19* 19*  ANIONGAP 10  --   --  12 14  GLUCOSE 142*  --   --  292* 201*  BUN 11  --   --  14 15  CREATININE 1.45*  --   --  1.59* 1.30*  AST 21  --   --  17 20  ALT 14  --   --  12 15  ALKPHOS 75  --   --  58 64  BILITOT 0.7  --   --  0.5 0.4  ALBUMIN 3.4*  --   --  2.9* 3.0*  LATICACIDVEN  --  1.3  --   --   --   HGBA1C  --   --  7.5*  --   --   MG  --   --   --   --  1.9  CALCIUM 9.6  --   --  9.0 9.3      Recent Labs  Lab 03/29/23 1213 03/29/23 1233 03/29/23 2101 03/30/23 0548 03/31/23 0544  LATICACIDVEN  --  1.3  --   --   --   HGBA1C  --   --  7.5*  --   --   MG  --    --   --   --  1.9  CALCIUM 9.6  --   --  9.0 9.3    --------------------------------------------------------------------------------------------------------------- Lab Results  Component Value Date   CHOL 125 06/21/2020   HDL 47 06/21/2020   LDLCALC 61 06/21/2020   TRIG 83 06/21/2020   CHOLHDL 2.7 06/21/2020    Lab Results  Component Value Date   HGBA1C 7.5 (H) 03/29/2023   No results for input(s): "TSH", "T4TOTAL", "FREET4", "T3FREE", "THYROIDAB" in the last 72 hours. No results for input(s): "VITAMINB12", "FOLATE", "FERRITIN", "TIBC", "IRON", "RETICCTPCT" in the last 72 hours. ------------------------------------------------------------------------------------------------------------------ Cardiac Enzymes No results for input(s): "CKMB", "TROPONINI", "MYOGLOBIN" in the last 168 hours.  Invalid input(s): "CK"  Micro Results Recent Results (from the past 240 hours)  Urine Culture     Status: Abnormal (Preliminary result)   Collection Time: 03/29/23 12:13 PM   Specimen: Urine, Random  Result Value Ref Range Status   Specimen Description URINE, RANDOM  Final   Special Requests NONE Reflexed from [id number]  Final   Culture (A)  Final    >=100,000 COLONIES/mL KLEBSIELLA OXYTOCA >=100,000 COLONIES/mL ESCHERICHIA COLI ATTEMPTING TO ISOLATE FOR WORKUP Performed at [facility], [address] [place], [place] [address]    Report Status PENDING  Incomplete    Radiology Report DG C-Arm 1-60 Min Result Date: 03/29/2023 CLINICAL DATA:  Cystography EXAM: DG C-ARM 1-60 MIN FLUOROSCOPY: Fluoroscopy Time:  43 seconds Radiation Exposure Index (if provided by the fluoroscopic device): 9.46 mGy Number of Acquired Spot Images: 3 COMPARISON:  CT 03/15/2023 FINDINGS: Three low resolution intraoperative spot views of the abdomen. Images demonstrate filling defect within the proximal to mid left ureter corresponding to history of stone. There is hydronephrosis and hydroureter upstream to  the stone. Subsequent wire access of left renal collecting system with placement of stent, the distal portion of the stent is nonvisualized. The proximal aspect of the stent overlies the left renal collecting system IMPRESSION: Intraoperative fluoroscopic assistance provided during left  ureteral stent placement Electronically Signed   By: Jasmine Pang M.D.   On: 03/29/2023 18:21     Signature  -   Susa Raring M.D on 03/31/2023 at 8:48 AM   -  To page go to [email or web address]

## 2023-04-01 ENCOUNTER — Other Ambulatory Visit (HOSPITAL_COMMUNITY): Payer: Self-pay

## 2023-04-01 DIAGNOSIS — N132 Hydronephrosis with renal and ureteral calculous obstruction: Secondary | ICD-10-CM | POA: Diagnosis not present

## 2023-04-01 LAB — CBC WITH DIFFERENTIAL/PLATELET
Abs Immature Granulocytes: 0.03 10*3/uL (ref 0.00–0.07)
Basophils Absolute: 0.1 10*3/uL (ref 0.0–0.1)
Basophils Relative: 1 %
Eosinophils Absolute: 0.3 10*3/uL (ref 0.0–0.5)
Eosinophils Relative: 4 %
HCT: 41.9 % (ref 36.0–46.0)
Hemoglobin: 13.6 g/dL (ref 12.0–15.0)
Immature Granulocytes: 0 %
Lymphocytes Relative: 49 %
Lymphs Abs: 4.2 10*3/uL — ABNORMAL HIGH (ref 0.7–4.0)
MCH: 26.5 pg (ref 26.0–34.0)
MCHC: 32.5 g/dL (ref 30.0–36.0)
MCV: 81.5 fL (ref 80.0–100.0)
Monocytes Absolute: 0.7 10*3/uL (ref 0.1–1.0)
Monocytes Relative: 9 %
Neutro Abs: 3.1 10*3/uL (ref 1.7–7.7)
Neutrophils Relative %: 37 %
Platelets: 280 10*3/uL (ref 150–400)
RBC: 5.14 MIL/uL — ABNORMAL HIGH (ref 3.87–5.11)
RDW: 14.3 % (ref 11.5–15.5)
WBC: 8.4 10*3/uL (ref 4.0–10.5)
nRBC: 0 % (ref 0.0–0.2)

## 2023-04-01 LAB — COMPREHENSIVE METABOLIC PANEL
ALT: 21 U/L (ref 0–44)
AST: 27 U/L (ref 15–41)
Albumin: 3 g/dL — ABNORMAL LOW (ref 3.5–5.0)
Alkaline Phosphatase: 61 U/L (ref 38–126)
Anion gap: 7 (ref 5–15)
BUN: 17 mg/dL (ref 8–23)
CO2: 24 mmol/L (ref 22–32)
Calcium: 9 mg/dL (ref 8.9–10.3)
Chloride: 104 mmol/L (ref 98–111)
Creatinine, Ser: 1.23 mg/dL — ABNORMAL HIGH (ref 0.44–1.00)
GFR, Estimated: 48 mL/min — ABNORMAL LOW (ref >60)
Glucose, Bld: 160 mg/dL — ABNORMAL HIGH (ref 70–99)
Potassium: 4.1 mmol/L (ref 3.5–5.1)
Sodium: 135 mmol/L (ref 135–145)
Total Bilirubin: 0.5 mg/dL (ref 0.0–1.2)
Total Protein: 7.4 g/dL (ref 6.5–8.1)

## 2023-04-01 LAB — URINE CULTURE: Culture: >=100000 — AB

## 2023-04-01 LAB — MAGNESIUM: Magnesium: 1.9 mg/dL (ref 1.7–2.4)

## 2023-04-01 LAB — GLUCOSE, CAPILLARY: Glucose-Capillary: 160 mg/dL — ABNORMAL HIGH (ref 70–99)

## 2023-04-01 MED ORDER — NITROFURANTOIN MONOHYD MACRO 100 MG PO CAPS
100.0000 mg | ORAL_CAPSULE | Freq: Two times a day (BID) | ORAL | 0 refills | Status: AC
  Filled 2023-04-01: qty 14, 7d supply, fill #0

## 2023-04-01 MED ORDER — SPIRONOLACTONE 25 MG PO TABS: 25.0000 mg | ORAL_TABLET | Freq: Every day | ORAL | Status: DC

## 2023-04-01 MED ORDER — SODIUM CHLORIDE 0.9 % IV SOLN
1.0000 g | Freq: Three times a day (TID) | INTRAVENOUS | Status: DC
  Administered 2023-04-01: 1 g via INTRAVENOUS
  Filled 2023-04-01: qty 20

## 2023-04-01 NOTE — TOC Transition Note (Signed)
 Transition of Care [facility]) - Discharge Note   Patient Details  Name: [name] MRN: [id number] Date of Birth: 1955-04-30  [facility] CM/SW Contact:  Gordy Clement, RN Phone Number: 04/01/2023, 11:01 AM   Clinical Narrative:    Patient to dc to home today.  There have been no TOC needs identified. Family will transport         Patient Goals and CMS Choice            Discharge Placement                       Discharge Plan and Services Additional resources added to the After Visit Summary for                                       Social Drivers of Health (SDOH) Interventions SDOH Screenings   Food Insecurity: No Food Insecurity (03/29/2023)  Housing: Low Risk  (03/29/2023)  Transportation Needs: No Transportation Needs (03/29/2023)  Utilities: Not At Risk (03/29/2023)  Depression (PHQ2-9): Low Risk  (11/29/2018)  Financial Resource Strain: Low Risk  (02/07/2022)   Received from [facility], [facility]  Social Connections: Socially Isolated (03/29/2023)  Stress: No Stress Concern Present (02/07/2022)   Received from [facility], [facility]  Tobacco Use: Medium Risk (03/29/2023)     Readmission Risk Interventions     No data to display

## 2023-04-01 NOTE — Plan of Care (Signed)

## 2023-04-01 NOTE — Progress Notes (Signed)
   3 Days Post-Op  Subjective: Pt resting on arrival. NAEON. Reviewed plans for outpt definitive stone mgmt.   Objective: Vital signs in last 24 hours: Temp:  [97.4 F (36.3 C)-97.8 F (36.6 C)] 97.7 F (36.5 C) (02/24 0835) Pulse Rate:  [66-72] 68 (02/24 0423) Resp:  [15-16] 15 (02/24 0423) BP: (110-118)/(56-95) 113/56 (02/24 0835) SpO2:  [93 %-96 %] 93 % (02/24 0423)  Assessment/Plan: #left proximal stone #ESBL Klebsiella and E.Coli UTI  S/p left ureteral stent with Dr. Mena Goes 03/29/23  Definitive stone mgmt on an outpt basis.  Considering her allergy list, multiple organisms, recently failed course of Macrobid, and resistant bacteria, I would recommend discussing best course of ABX treatment with ID. She will benfit from suppressive bridge to treatment if possible.   Trend labs, incremental daily improvement in Scr.   Continue Estrace  Urology will sign off at this time. Please call with questions   Intake/Output from previous day: No intake/output data recorded.  Intake/Output this shift: No intake/output data recorded.  Physical Exam:  General: Alert and oriented CV: No cyanosis Lungs: equal chest rise   Lab Results: Recent Labs    03/30/23 0548 03/31/23 0544 04/01/23 0532  HGB 13.5 13.5 13.6  HCT 41.2 40.2 41.9   BMET Recent Labs    03/31/23 0544 04/01/23 0532  NA 135 135  K 3.9 4.1  CL 102 104  CO2 19* 24  GLUCOSE 201* 160*  BUN 15 17  CREATININE 1.30* 1.23*  CALCIUM 9.3 9.0     Studies/Results: No results found.    LOS: 3 days   Elmon Kirschner, NP [facility] Pager: [phone]  04/01/2023, 10:33 AM

## 2023-04-01 NOTE — Plan of Care (Signed)

## 2023-04-01 NOTE — Discharge Summary (Signed)
 [name] UEA:[id number] DOB: Jun 06, 1955 DOA: 03/29/2023  PCP: Mila Palmer, MD  Admit date: 03/29/2023  Discharge date: 04/01/2023  Admitted From: Home   Disposition:  Home   Recommendations for Outpatient Follow-up:   Follow up with PCP in 1-2 weeks  PCP Please obtain BMP/CBC, 2 view CXR in 1week,  (see Discharge instructions)   PCP Please follow up on the following pending results:    Home Health: None   Equipment/Devices: None  Consultations: Urology, ID over the phone Dr Drue Second Discharge Condition: Stable     CODE STATUS: Full     Diet Recommendation: Heart Healthy Low Carb    Chief Complaint  Patient presents with   Kidney Stone     Brief history of present illness from the day of admission and additional interim summary    68 y.o. female with past medical history  of allergies to 14 medications please see complete last, history recurrent UTI multidrug-resistant Klebsiella has been on Bactrim and fosphenytoin was seen by ID and referred to urology where patient had a CT of the abdomen showing a 5 mm left proximal stone with hydronephrosis. Admission requested  for medical issues which include her kidney disease and heart disease and diabetes.  Patient on arrival to the ED was taken to the OR urgently, received left-sided ureteral stent by urology, admitted for further care.                                                                  Hospital Course   Hydronephrosis with urinary obstruction due to renal calculus with AKI and possible UTI.  Present on admission Patient found to have hydronephrosis with ureteral obstruction taken to the OR immediately and underwent cystoscopy and  stent placement.  By urologist Dr. Mena Goes on 03/29/2023, was also treated with IV fluids, AKI has improved and renal  function close to normal, she was kept on IV antibiotics, clinically infection has resolved, no signs of sepsis, urine cultures noted discussed with ID physician Dr. Drue Second, 1 week of nitrofurantoin, outpatient follow-up with urology and PCP postdischarge.       AKI (acute kidney injury) (HCC)   See above   Hypothyroidism.  On Synthroid.     Hypertension.  Low-dose beta-blocker and monitor   Dyslipidemia.  Home dose statin.     GERD.  PPI.     Obesity.  BMI 36.  Follow-up with PCP.  Diabetes [facility])   Discharge diagnosis     Principal Problem:   Hydronephrosis with urinary obstruction due to renal calculus Active Problems:   Diabetes (HCC)   AKI (acute kidney injury) (HCC)   Hypothyroidism   Hypertension    Discharge instructions    Discharge Instructions     Discharge instructions   Complete by: As directed  Be sure to follow-up with Dr. Margo Aye or Dr. Pete Glatter for stent/stone removal [facility] [place] [phone]    Follow with Primary MD Mila Palmer, MD in 7 days   Get CBC, CMP, 2 view Chest X ray -  checked next visit with your primary MD    Activity: As tolerated with Full fall precautions use walker/cane & assistance as needed  Disposition Home    Diet: Heart Healthy low carbohydrate diet, check CBGs q. [facility] S.  Special Instructions: If you have smoked or chewed Tobacco  in the last 2 yrs please stop smoking, stop any regular Alcohol  and or any Recreational drug use.  On your next visit with your primary care physician please Get Medicines reviewed and adjusted.  Please request your Prim.MD to go over all Hospital Tests and Procedure/Radiological results at the follow up, please get all Hospital records sent to your Prim MD by signing hospital release before you go home.  If you experience worsening of your admission symptoms, develop shortness of breath, life threatening emergency, suicidal or homicidal thoughts you must seek medical  attention immediately by calling 911 or calling your MD immediately  if symptoms less severe.  You Must read complete instructions/literature along with all the possible adverse reactions/side effects for all the Medicines you take and that have been prescribed to you. Take any new Medicines after you have completely understood and accpet all the possible adverse reactions/side effects.   Do not drive when taking Pain medications.  Do not take more than prescribed Pain, Sleep and Anxiety Medications  Wear Seat belts while driving.   Increase activity slowly   Complete by: As directed        Discharge Medications   Allergies as of 04/01/2023       Reactions   Strawberry Extract Anaphylaxis   Cefdinir Other (See Comments)   Oxycodone Hcl Nausea And Vomiting   Sulfamethoxazole-trimethoprim Rash   Topiramate Other (See Comments)   GI Upset, Dizzy, HA, Blurred Vision   Cymbalta [duloxetine Hcl] Other (See Comments)   Dizzy   Lisinopril Itching, Swelling, Other (See Comments)   Angioedema   Lunesta [eszopiclone] Other (See Comments)   Bad taste in mouth    Metformin And Related Other (See Comments)   Damaged patient's kidneys   Other Other (See Comments)   Pt is a Jehovah Witness. No blood products.   Sulfa Antibiotics Itching, Swelling, Other (See Comments)   Angioedema   Tramadol Other (See Comments)   Hallucinations   Trazodone And Nefazodone Other (See Comments)   Shakes   Penicillins Rash, Other (See Comments)   "Paralyzed" (per patient) Has patient had a PCN reaction causing immediate rash, facial/tongue/throat swelling, SOB or lightheadedness with hypotension: Yes Has patient had a PCN reaction causing severe rash involving mucus membranes or skin necrosis: No Has patient had a PCN reaction that required hospitalization Yes Has patient had a PCN reaction occurring within the last 10 years: No If all of the above answers are "NO", then may proceed with Cephalosporin use.         Medication List     STOP taking these medications    fosfomycin 3 g Pack Commonly known as: MONUROL       TAKE these medications    Accu-Chek Guide test strip Generic drug: glucose blood   Accu-Chek Guide w/Device Kit   Accu-Chek Softclix Lancets lancets   acetaminophen-codeine 300-30 MG tablet Commonly known as: TYLENOL #3 Take 1 tablet  by mouth every 6 (six) hours as needed for moderate pain (pain score 4-6).   amLODipine 10 MG tablet Commonly known as: NORVASC Take 10 mg by mouth in the morning.   aspirin EC 81 MG tablet Take 1 tablet (81 mg total) by mouth daily. Swallow whole.   atenolol 100 MG tablet Commonly known as: TENORMIN Take 1 tablet (100 mg total) by mouth daily.   B-D UF III MINI PEN NEEDLES 31G X 5 MM Misc Generic drug: Insulin Pen Needle   cilostazol 100 MG tablet Commonly known as: PLETAL TAKE 1 TABLET BY MOUTH TWICE A DAY   dicyclomine 10 MG capsule Commonly known as: BENTYL Take 10 mg by mouth 3 (three) times daily before meals.   estradiol 0.1 MG/GM vaginal cream Commonly known as: ESTRACE Apply 3 times weekly by using a pea-sized amount applied to fingertip   FRUIT & VEGETABLE DAILY PO Take 6 capsules by mouth daily with lunch. Balance of Nature Fruit and Vegetable Supplements (3 Fruit + 3 Vegetable)   insulin aspart protamine - aspart (70-30) 100 UNIT/ML FlexPen Commonly known as: NOVOLOG 70/30 MIX Inject 40 Units into the skin in the morning and at bedtime.   levothyroxine 100 MCG tablet Commonly known as: SYNTHROID Take 100 mcg by mouth daily before breakfast.   Mounjaro 15 MG/0.5ML Pen Generic drug: tirzepatide Inject 15 mg into the skin once a week.   nitrofurantoin (macrocrystal-monohydrate) 100 MG capsule Commonly known as: Macrobid Take 1 capsule (100 mg total) by mouth 2 (two) times daily for 7 days.   ondansetron 4 MG tablet Commonly known as: Zofran Take 1 tablet (4 mg total) by mouth every 8 (eight)  hours as needed for nausea or vomiting.   Ozempic (1 MG/DOSE) 2 MG/1.5ML Sopn Generic drug: Semaglutide (1 MG/DOSE) Inject 1 mg into the skin every Saturday.   pantoprazole 40 MG tablet Commonly known as: Protonix Take 1 tablet (40 mg total) by mouth daily. What changed:  when to take this reasons to take this   ProAir HFA 108 (90 Base) MCG/ACT inhaler Generic drug: albuterol Inhale 2-3 puffs into the lungs every 6 (six) hours as needed for shortness of breath.   albuterol (2.5 MG/3ML) 0.083% nebulizer solution Commonly known as: PROVENTIL Take 3 mLs (2.5 mg total) by nebulization every 6 (six) hours as needed for wheezing or shortness of breath.   Repatha SureClick 140 MG/ML Soaj Generic drug: Evolocumab INJECT 1 PEN INTO THE SKIN EVERY 14 (FOURTEEN) DAYS.   rosuvastatin 40 MG tablet Commonly known as: CRESTOR Take 1 tablet (40 mg total) by mouth daily.   spironolactone 25 MG tablet Commonly known as: ALDACTONE Take 1 tablet (25 mg total) by mouth daily. Start taking on: April 03, 2023 What changed: These instructions start on April 03, 2023. If you are unsure what to do until then, ask your doctor or other care provider.   Vitamin D3 125 MCG (5000 UT) Tabs Take 5,000 Units by mouth in the morning.   zolpidem 12.5 MG CR tablet Commonly known as: AMBIEN CR Take 12.5 mg by mouth at bedtime as needed for sleep.         Follow-up Information     Joline Maxcy, MD Follow up on 04/10/2023.   Specialty: Urology Why: at 1 PM as planned. Contact information: [address] [place] [address] [phone]         Mila Palmer, MD. Schedule an appointment as soon as possible for  a visit in 1 week(s).   Specialty: Family Medicine Contact information: [address] [place] [address] [phone]                 Major procedures and Radiology Reports - PLEASE review detailed and final reports  thoroughly  -        DG C-Arm 1-60 Min Result Date: 03/29/2023 CLINICAL DATA:  Cystography EXAM: DG C-ARM 1-60 MIN FLUOROSCOPY: Fluoroscopy Time:  43 seconds Radiation Exposure Index (if provided by the fluoroscopic device): 9.46 mGy Number of Acquired Spot Images: 3 COMPARISON:  CT 03/15/2023 FINDINGS: Three low resolution intraoperative spot views of the abdomen. Images demonstrate filling defect within the proximal to mid left ureter corresponding to history of stone. There is hydronephrosis and hydroureter upstream to the stone. Subsequent wire access of left renal collecting system with placement of stent, the distal portion of the stent is nonvisualized. The proximal aspect of the stent overlies the left renal collecting system IMPRESSION: Intraoperative fluoroscopic assistance provided during left ureteral stent placement Electronically Signed   By: Jasmine Pang M.D.   On: 03/29/2023 18:21   CT RENAL STONE STUDY Result Date: 03/28/2023 CLINICAL DATA:  Recurrent urinary tract infections. EXAM: CT ABDOMEN AND PELVIS WITHOUT CONTRAST TECHNIQUE: Multidetector CT imaging of the abdomen and pelvis was performed following the standard protocol without IV contrast. RADIATION DOSE REDUCTION: This exam was performed according to the departmental dose-optimization program which includes automated exposure control, adjustment of the mA and/or kV according to patient size and/or use of iterative reconstruction technique. COMPARISON:  06/20/2020 FINDINGS: Lower chest: No acute findings. Bibasilar interstitial fibrosis again noted. Hepatobiliary: Caudate lobe hypertrophy and capsular nodularity are consistent with cirrhosis. No mass visualized on this unenhanced exam. Gallbladder is unremarkable. No evidence of biliary ductal dilatation. Pancreas: No mass or inflammatory process visualized on this unenhanced exam. Spleen:  Within normal limits in size. Adrenals/Urinary tract: 2 mm calculus seen in lower pole of  left kidney. Mild left hydronephrosis is seen due to a 5 mm calculus in the proximal left ureter. Stomach/Bowel: No evidence of obstruction, inflammatory process, or abnormal fluid collections. Surgical mesh again seen in the anterior abdominal wall soft tissues. No evidence of recurrent hernia. Vascular/Lymphatic: No pathologically enlarged lymph nodes identified. No evidence of abdominal aortic aneurysm. Reproductive: Prior hysterectomy noted. Adnexal regions are unremarkable in appearance. Other:  None. Musculoskeletal: No suspicious bone lesions identified. Bilateral L5 pars defects are again seen, with degenerative disc disease and grade 1 anterolisthesis at L5-S1. IMPRESSION: Mild left hydronephrosis due to 5 mm proximal left ureteral calculus. Tiny left renal calculus. Hepatic cirrhosis. Electronically Signed   By: Danae Orleans M.D.   On: 03/28/2023 16:21   MM 3D SCREENING MAMMOGRAM BILATERAL BREAST Result Date: 03/21/2023 CLINICAL DATA:  Screening. Technologist indicates best positioning/images possible. EXAM: DIGITAL SCREENING BILATERAL MAMMOGRAM WITH TOMOSYNTHESIS AND CAD TECHNIQUE: Bilateral screening digital craniocaudal and mediolateral oblique mammograms were obtained. Bilateral screening digital breast tomosynthesis was performed. The images were evaluated with computer-aided detection. COMPARISON:  Previous exam(s). ACR Breast Density Category b: There are scattered areas of fibroglandular density. FINDINGS: There are no findings suspicious for malignancy. IMPRESSION: No mammographic evidence of malignancy. A result letter of this screening mammogram will be mailed directly to the patient. RECOMMENDATION: Screening mammogram in one year. (Code:SM-B-01Y) BI-RADS CATEGORY  1: Negative. Electronically Signed   By: Sherron Ales M.D.   On: 03/21/2023 16:24    Micro Results    Recent Results (  from the past 240 hours)  Urine Culture     Status: Abnormal   Collection Time: 03/29/23 12:13 PM    Specimen: Urine, Random  Result Value Ref Range Status   Specimen Description URINE, RANDOM  Final   Special Requests   Final    NONE Reflexed from [id number] Performed at [facility], [address], [place], [place] [address]    Culture (A)  Final    >=100,000 COLONIES/mL KLEBSIELLA OXYTOCA >=100,000 COLONIES/mL ESCHERICHIA COLI Confirmed Extended Spectrum Beta-Lactamase Producer (ESBL).  In bloodstream infections from ESBL organisms, carbapenems are preferred over piperacillin/tazobactam. They are shown to have a lower risk of mortality.    Report Status 04/01/2023 FINAL  Final   Organism ID, Bacteria KLEBSIELLA OXYTOCA (A)  Final   Organism ID, Bacteria ESCHERICHIA COLI (A)  Final      Susceptibility   Escherichia coli - MIC*    AMPICILLIN >=32 RESISTANT Resistant     CEFAZOLIN >=64 RESISTANT Resistant     CEFEPIME 16 RESISTANT Resistant     CEFTRIAXONE 32 RESISTANT Resistant     CIPROFLOXACIN >=4 RESISTANT Resistant     GENTAMICIN <=1 SENSITIVE Sensitive     IMIPENEM <=0.25 SENSITIVE Sensitive     NITROFURANTOIN <=16 SENSITIVE Sensitive     TRIMETH/SULFA >=320 RESISTANT Resistant     AMPICILLIN/SULBACTAM <=2 SENSITIVE Sensitive     PIP/TAZO <=4 SENSITIVE Sensitive ug/mL    * >=100,000 COLONIES/mL ESCHERICHIA COLI   Klebsiella oxytoca - MIC*    AMPICILLIN >=32 RESISTANT Resistant     CEFEPIME <=0.12 SENSITIVE Sensitive     CEFTRIAXONE <=0.25 SENSITIVE Sensitive     CIPROFLOXACIN <=0.25 SENSITIVE Sensitive     GENTAMICIN <=1 SENSITIVE Sensitive     IMIPENEM <=0.25 SENSITIVE Sensitive     NITROFURANTOIN 32 SENSITIVE Sensitive     TRIMETH/SULFA <=20 SENSITIVE Sensitive     AMPICILLIN/SULBACTAM 8 SENSITIVE Sensitive     PIP/TAZO <=4 SENSITIVE Sensitive ug/mL    * >=100,000 COLONIES/mL KLEBSIELLA OXYTOCA    Today   Subjective    [name] today has no headache,no chest abdominal pain,no new weakness tingling or numbness, feels much better wants to go home today.     Objective   Blood pressure (!) 113/56, pulse 68, temperature 97.7 F (36.5 C), temperature source Oral, resp. rate 15, height 5\' 5"  (1.651 m), weight 98.4 kg, SpO2 93%.  No intake or output data in the 24 hours ending 04/01/23 1044  Exam  Awake Alert, No new F.N deficits,    [place].AT,PERRAL Supple Neck,   Symmetrical Chest wall movement, Good air movement bilaterally, CTAB RRR,No Gallops,   +ve B.Sounds, Abd Soft, Non tender,  No Cyanosis, Clubbing or edema    Data Review   Recent Labs  Lab 03/29/23 1213 03/30/23 0548 03/31/23 0544 04/01/23 0532  WBC 5.6 5.6 9.9 8.4  HGB 15.5* 13.5 13.5 13.6  HCT 47.0* 41.2 40.2 41.9  PLT 274 248 265 280  MCV 81.7 80.6 80.2 81.5  MCH 27.0 26.4 26.9 26.5  MCHC 33.0 32.8 33.6 32.5  RDW 14.3 14.1 14.2 14.3  LYMPHSABS 2.0  --  2.2 4.2*  MONOABS 0.4  --  0.6 0.7  EOSABS 0.3  --  0.0 0.3  BASOSABS 0.0  --  0.0 0.1    Recent Labs  Lab 03/29/23 1213 03/29/23 1233 03/29/23 2101 03/30/23 0548 03/31/23 0544 04/01/23 0532  NA 136  --   --  133* 135 135  K 3.7  --   --  4.5 3.9 4.1  CL 108  --   --  102 102 104  CO2 18*  --   --  19* 19* 24  ANIONGAP 10  --   --  12 14 7   GLUCOSE 142*  --   --  292* 201* 160*  BUN 11  --   --  14 15 17   CREATININE 1.45*  --   --  1.59* 1.30* 1.23*  AST 21  --   --  17 20 27   ALT 14  --   --  12 15 21   ALKPHOS 75  --   --  58 64 61  BILITOT 0.7  --   --  0.5 0.4 0.5  ALBUMIN 3.4*  --   --  2.9* 3.0* 3.0*  LATICACIDVEN  --  1.3  --   --   --   --   HGBA1C  --   --  7.5*  --   --   --   MG  --   --   --   --  1.9 1.9  CALCIUM 9.6  --   --  9.0 9.3 9.0    Total Time in preparing paper work, data evaluation and todays exam - 35 minutes  Signature  -    Susa Raring M.D on 04/01/2023 at 10:44 AM   -  To page go to [email or web address]

## 2023-04-01 NOTE — Care Management Important Message (Signed)
 Important Message  Patient Details  Name: EVOLEHT HOVATTER MRN: [id number] Date of Birth: 05-02-1955   Important Message Given:  Yes - Medicare IM  Patient left prior to Im delivery will mail to the patient home address.   Kaili Castille 04/01/2023, 3:29 PM

## 2023-04-05 ENCOUNTER — Ambulatory Visit (HOSPITAL_COMMUNITY)
Admission: RE | Admit: 2023-04-05 | Discharge: 2023-04-05 | Disposition: A | Discharge status: Home / Self Care | Payer: Medicare HMO | Source: Ambulatory Visit | Attending: Primary Care | Admitting: Primary Care

## 2023-04-05 DIAGNOSIS — J849 Interstitial pulmonary disease, unspecified: Secondary | ICD-10-CM | POA: Insufficient documentation

## 2023-04-05 DIAGNOSIS — R918 Other nonspecific abnormal finding of lung field: Secondary | ICD-10-CM | POA: Insufficient documentation

## 2023-04-05 DIAGNOSIS — J432 Centrilobular emphysema: Secondary | ICD-10-CM | POA: Diagnosis not present

## 2023-04-08 ENCOUNTER — Ambulatory Visit (HOSPITAL_COMMUNITY): Payer: Medicare HMO | Attending: Cardiovascular Disease

## 2023-04-08 DIAGNOSIS — R0602 Shortness of breath: Secondary | ICD-10-CM | POA: Diagnosis not present

## 2023-04-08 LAB — ECHOCARDIOGRAM COMPLETE
Area-P 1/2: 2.5 cm2
S' Lateral: 3.5 cm

## 2023-04-10 ENCOUNTER — Ambulatory Visit: Payer: Medicare HMO | Admitting: Urology

## 2023-04-10 ENCOUNTER — Other Ambulatory Visit: Payer: Self-pay | Admitting: Urology

## 2023-04-11 ENCOUNTER — Ambulatory Visit (HOSPITAL_BASED_OUTPATIENT_CLINIC_OR_DEPARTMENT_OTHER)
Admission: RE | Admit: 2023-04-11 | Discharge: 2023-04-11 | Disposition: A | Discharge status: Home / Self Care | Source: Ambulatory Visit | Attending: Urology | Admitting: Urology

## 2023-04-11 ENCOUNTER — Ambulatory Visit (INDEPENDENT_AMBULATORY_CARE_PROVIDER_SITE_OTHER): Admitting: Urology

## 2023-04-11 ENCOUNTER — Encounter: Payer: Self-pay | Admitting: Urology

## 2023-04-11 ENCOUNTER — Ambulatory Visit: Payer: Self-pay | Admitting: Urology

## 2023-04-11 VITALS — BP 150/85 | HR 96

## 2023-04-11 DIAGNOSIS — N390 Urinary tract infection, site not specified: Secondary | ICD-10-CM

## 2023-04-11 DIAGNOSIS — E1121 Type 2 diabetes mellitus with diabetic nephropathy: Secondary | ICD-10-CM | POA: Diagnosis not present

## 2023-04-11 DIAGNOSIS — N200 Calculus of kidney: Secondary | ICD-10-CM

## 2023-04-11 DIAGNOSIS — R82998 Other abnormal findings in urine: Secondary | ICD-10-CM | POA: Diagnosis not present

## 2023-04-11 DIAGNOSIS — Z87440 Personal history of urinary (tract) infections: Secondary | ICD-10-CM | POA: Diagnosis not present

## 2023-04-11 DIAGNOSIS — N183 Chronic kidney disease, stage 3 unspecified: Secondary | ICD-10-CM | POA: Diagnosis not present

## 2023-04-11 DIAGNOSIS — R112 Nausea with vomiting, unspecified: Secondary | ICD-10-CM | POA: Diagnosis not present

## 2023-04-11 LAB — URINALYSIS, ROUTINE W REFLEX MICROSCOPIC
Bilirubin, UA: NEGATIVE
Glucose, UA: NEGATIVE
Ketones, UA: NEGATIVE
Nitrite, UA: NEGATIVE
Specific Gravity, UA: 1.02 (ref 1.005–1.030)
Urobilinogen, Ur: 0.2 mg/dL (ref 0.2–1.0)
pH, UA: 7 (ref 5.0–7.5)

## 2023-04-11 LAB — MICROSCOPIC EXAMINATION: WBC, UA: >30 /HPF — AB (ref 0–5)
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
## 2023-04-12 ENCOUNTER — Other Ambulatory Visit: Payer: Self-pay

## 2023-04-12 ENCOUNTER — Encounter (HOSPITAL_COMMUNITY): Payer: Self-pay | Admitting: Urology

## 2023-04-12 ENCOUNTER — Telehealth: Payer: Self-pay | Admitting: Urology

## 2023-04-12 ENCOUNTER — Encounter (HOSPITAL_COMMUNITY): Payer: Self-pay

## 2023-04-12 MED ORDER — NITROFURANTOIN MONOHYD MACRO 100 MG PO CAPS
100.0000 mg | ORAL_CAPSULE | Freq: Two times a day (BID) | ORAL | 0 refills | Status: DC
Start: 2023-04-12 — End: 2023-04-23

## 2023-04-12 NOTE — Progress Notes (Addendum)
 COVID Vaccine Completed:  Yes  Date of COVID positive in last 90 days:  No  PCP - Mila Palmer, MD Cardiologist - Charlton Haws, MD Pulmonologist - Levy Pupa, MD  Chest x-ray - CT chest 04-05-23 epic EKG - 03-05-23 Epic Stress Test - 03-27-17 Epic ECHO - 04-08-23 Epic Cardiac Cath - Yes, several years ago Pacemaker/ICD device last checked: Spinal Cord Stimulator:  N/A  Bowel Prep - N/A  Sleep Study - Yes, +sleep apnea CPAP - No, does not use  Fasting Blood Sugar - 100 to 125 Checks Blood Sugar 2 times a day  [place] Last dose of GLP1 agonist-   04-11-23 GLP1 instructions:  Hold until after surgery     Last dose of SGLT-2 inhibitors-  N/A SGLT-2 instructions:  Hold 3 days before surgery   Blood Thinner Instructions:  Pletal.  Pt states that she is to stay on Aspirin Instructions:  ASA 81.  Patient states that she is to stay on Last Dose:  Activity level:  Unable to climb stairs due to leg/knee pain.  Able to perform activities of daily living without stopping and without symptoms of chest pain.  Patient states that she does have some shortness of breath with exertion but this has not worsened.  Pt lives alone  Anesthesia review:  CAD, carotid stenosis, PVD, HTN, ILD, DM,OSA.  Hx of stroke (no deficits)  Patient denies shortness of breath, fever, cough and chest pain at PAT appointment (completed over the phone)  Patient verbalized understanding of instructions that were given to them at the PAT appointment. Patient was also instructed that they will need to review over the PAT instructions again at home before surgery.

## 2023-04-12 NOTE — Telephone Encounter (Signed)
 Marland Kitchen
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
## 2023-04-12 NOTE — Addendum Note (Signed)
 Addended by: Joline Maxcy on: 04/12/2023 10:44 AM   Modules accepted: Orders

## 2023-04-12 NOTE — Patient Instructions (Signed)
 SURGICAL WAITING ROOM VISITATION Patients having surgery or a procedure may have no more than 2 support people in the waiting area - these visitors may rotate.    Children under the age of 57 must have an adult with them who is not the patient.  Due to an increase in RSV and influenza rates and associated hospitalizations, children ages 35 and under may not visit patients in [facility] hospitals.   If the patient needs to stay at the hospital during part of their recovery, the visitor guidelines for inpatient rooms apply. Pre-op nurse will coordinate an appropriate time for 1 support person to accompany patient in pre-op.  This support person may not rotate.    Please refer to the [facility] website for the visitor guidelines for Inpatients (after your surgery is over and you are in a regular room).       Your procedure is scheduled on: 04-16-23   Report to [facility] Main Entrance    Report to admitting at 11:45 AM   Call this number if you have problems the morning of surgery [phone]   Do not eat food or drink liquids :After Midnight.           If you have questions, please contact your surgeon's office.   FOLLOW  ANY ADDITIONAL PRE OP INSTRUCTIONS YOU RECEIVED FROM YOUR SURGEON'S OFFICE!!!     Oral Hygiene is also important to reduce your risk of infection.                                    Remember - BRUSH YOUR TEETH THE MORNING OF SURGERY WITH YOUR REGULAR TOOTHPASTE   Do NOT smoke after Midnight   Take these medicines the morning of surgery with A SIP OF WATER:    Amlodipine   Atenolol   Levothyroxine   Pantoprazole   Rosuvastatin  Stop all vitamins and herbal supplements 7 days before surgery  How to Manage Your Diabetes Before and After Surgery  Why is it important to control my blood sugar before and after surgery? Improving blood sugar levels before and after surgery helps healing and can limit problems. A way of improving blood sugar  control is eating a healthy diet by:  Eating less sugar and carbohydrates  Increasing activity/exercise  Talking with your doctor about reaching your blood sugar goals High blood sugars (greater than 180 mg/dL) can raise your risk of infections and slow your recovery, so you will need to focus on controlling your diabetes during the weeks before surgery. Make sure that the doctor who takes care of your diabetes knows about your planned surgery including the date and location.  How do I manage my blood sugar before surgery? Check your blood sugar at least 4 times a day, starting 2 days before surgery, to make sure that the level is not too high or low. Check your blood sugar the morning of your surgery when you wake up and every 2 hours until you get to the Short Stay unit. If your blood sugar is less than 70 mg/dL, you will need to treat for low blood sugar: Do not take insulin. Treat a low blood sugar (less than 70 mg/dL) with  cup of clear juice (cranberry or apple), 4 glucose tablets, OR glucose gel. Recheck blood sugar in 15 minutes after treatment (to make sure it is greater than 70 mg/dL). If your blood sugar  is not greater than 70 mg/dL on recheck, call [phone] for further instructions. Report your blood sugar to the short stay nurse when you get to Short Stay.  If you are admitted to the hospital after surgery: Your blood sugar will be checked by the staff and you will probably be given insulin after surgery (instead of oral diabetes medicines) to make sure you have good blood sugar levels. The goal for blood sugar control after surgery is 80-180 mg/dL.   WHAT DO I DO ABOUT MY DIABETES MEDICATION?  Do not take oral diabetes medicines (pills) the morning of surgery.  THE NIGHT BEFORE SURGERY, take 28 units of insulin.       THE MORNING OF SURGERY, do not take insulin.        Hold Mounjaro until after surgery   DO NOT TAKE THE FOLLOWING 7 DAYS PRIOR TO SURGERY: Ozempic,  Wegovy, Rybelsus (Semaglutide), Byetta (exenatide), Bydureon (exenatide ER), Victoza, Saxenda (liraglutide), or Trulicity (dulaglutide) Mounjaro (Tirzepatide) Adlyxin (Lixisenatide), Polyethylene Glycol Loxenatide.  Reviewed and Endorsed by [facility] Patient Education Committee, August 2015                              You may not have any metal on your body including hair pins, jewelry, and body piercing             Do not wear make-up, lotions, powders, perfumes or deodorant  Do not wear nail polish including gel and S&S, artificial/acrylic nails, or any other type of covering on natural nails including finger and toenails. If you have artificial nails, gel coating, etc. that needs to be removed by a nail salon please have this removed prior to surgery or surgery may need to be canceled/ delayed if the surgeon/ anesthesia feels like they are unable to be safely monitored.   Do not shave  48 hours prior to surgery.    Do not bring valuables to the hospital. [facility] IS NOT RESPONSIBLE   FOR VALUABLES.   Contacts, dentures or bridgework may not be worn into surgery.  DO NOT BRING YOUR HOME MEDICATIONS TO THE HOSPITAL. PHARMACY WILL DISPENSE MEDICATIONS LISTED ON YOUR MEDICATION LIST TO YOU DURING YOUR ADMISSION IN THE HOSPITAL!    Patients discharged on the day of surgery will not be allowed to drive home.  Someone NEEDS to stay with you for the first 24 hours after anesthesia.               Please read over the following fact sheets you were given: IF YOU HAVE QUESTIONS ABOUT YOUR PRE-OP INSTRUCTIONS PLEASE CALL [phone] [facility]  If you received a COVID test during your pre-op visit  it is requested that you wear a mask when out in public, stay away from anyone that may not be feeling well and notify your surgeon if you develop symptoms. If you test positive for Covid or have been in contact with anyone that has tested positive in the last 10 days please notify you surgeon.  [facility] - Preparing for Surgery (antibacterial soap) Before surgery, you can play an important role.  Because skin is not sterile, your skin needs to be as free of germs as possible.  You can reduce the number of germs on your skin by washing with CHG (chlorahexidine gluconate) soap before surgery.  CHG is an antiseptic cleaner which kills germs and bonds with the skin to continue killing germs even  after washing. Please DO NOT use if you have an allergy to CHG or antibacterial soaps.  If your skin becomes reddened/irritated stop using the CHG and inform your nurse when you arrive at Short Stay. Do not shave (including legs and underarms) for at least 48 hours prior to the first CHG shower.  You may shave your face/neck.  Please follow these instructions carefully:  1.  Shower with CHG Soap the night before surgery and the  morning of surgery.  2.  If you choose to wash your hair, wash your hair first as usual with your normal  shampoo.  3.  After you shampoo, rinse your hair and body thoroughly to remove the shampoo.                             4.  Use CHG as you would any other liquid soap.  You can apply chg directly to the skin and wash.  Gently with a scrungie or clean washcloth.  5.  Apply the CHG Soap to your body ONLY FROM THE NECK DOWN.   Do   not use on face/ open                           Wound or open sores. Avoid contact with eyes, ears mouth and   genitals (private parts).                       Wash face,  Genitals (private parts) with your normal soap.             6.  Wash thoroughly, paying special attention to the area where your    surgery  will be performed.  7.  Thoroughly rinse your body with warm water from the neck down.  8.  DO NOT shower/wash with your normal soap after using and rinsing off the CHG Soap.                9.  Pat yourself dry with a clean towel.            10.  Wear clean pajamas.            11.  Place clean sheets on your bed the night of your first shower  and do not  sleep with pets. Day of Surgery : Do not apply any lotions/deodorants the morning of surgery.  Please wear clean clothes to the hospital/surgery center.  FAILURE TO FOLLOW THESE INSTRUCTIONS MAY RESULT IN THE CANCELLATION OF YOUR SURGERY  PATIENT SIGNATURE_________________________________  NURSE SIGNATURE__________________________________  ________________________________________________________________________

## 2023-04-15 LAB — URINE CULTURE

## 2023-04-16 ENCOUNTER — Ambulatory Visit (HOSPITAL_BASED_OUTPATIENT_CLINIC_OR_DEPARTMENT_OTHER): Payer: Self-pay | Admitting: Physician Assistant

## 2023-04-16 ENCOUNTER — Encounter (HOSPITAL_COMMUNITY)
Admission: RE | Disposition: A | Discharge status: Home / Self Care | Payer: Self-pay | Source: Home / Self Care | Attending: Urology

## 2023-04-16 ENCOUNTER — Other Ambulatory Visit: Payer: Self-pay

## 2023-04-16 ENCOUNTER — Encounter (HOSPITAL_COMMUNITY): Payer: Self-pay | Admitting: Urology

## 2023-04-16 ENCOUNTER — Ambulatory Visit (HOSPITAL_COMMUNITY): Payer: Self-pay | Admitting: Physician Assistant

## 2023-04-16 ENCOUNTER — Ambulatory Visit (HOSPITAL_COMMUNITY)

## 2023-04-16 ENCOUNTER — Ambulatory Visit (HOSPITAL_COMMUNITY)
Admission: RE | Admit: 2023-04-16 | Discharge: 2023-04-16 | Disposition: A | Discharge status: Home / Self Care | Attending: Urology | Admitting: Urology

## 2023-04-16 DIAGNOSIS — K219 Gastro-esophageal reflux disease without esophagitis: Secondary | ICD-10-CM | POA: Insufficient documentation

## 2023-04-16 DIAGNOSIS — I2510 Atherosclerotic heart disease of native coronary artery without angina pectoris: Secondary | ICD-10-CM

## 2023-04-16 DIAGNOSIS — Z87440 Personal history of urinary (tract) infections: Secondary | ICD-10-CM | POA: Insufficient documentation

## 2023-04-16 DIAGNOSIS — E119 Type 2 diabetes mellitus without complications: Secondary | ICD-10-CM | POA: Diagnosis not present

## 2023-04-16 DIAGNOSIS — Z87891 Personal history of nicotine dependence: Secondary | ICD-10-CM | POA: Insufficient documentation

## 2023-04-16 DIAGNOSIS — E1151 Type 2 diabetes mellitus with diabetic peripheral angiopathy without gangrene: Secondary | ICD-10-CM | POA: Insufficient documentation

## 2023-04-16 DIAGNOSIS — I10 Essential (primary) hypertension: Secondary | ICD-10-CM | POA: Diagnosis not present

## 2023-04-16 DIAGNOSIS — G4733 Obstructive sleep apnea (adult) (pediatric): Secondary | ICD-10-CM | POA: Diagnosis not present

## 2023-04-16 DIAGNOSIS — N201 Calculus of ureter: Secondary | ICD-10-CM | POA: Diagnosis not present

## 2023-04-16 DIAGNOSIS — J45909 Unspecified asthma, uncomplicated: Secondary | ICD-10-CM | POA: Insufficient documentation

## 2023-04-16 DIAGNOSIS — G4730 Sleep apnea, unspecified: Secondary | ICD-10-CM | POA: Insufficient documentation

## 2023-04-16 DIAGNOSIS — N390 Urinary tract infection, site not specified: Secondary | ICD-10-CM

## 2023-04-16 HISTORY — DX: Dyspnea, unspecified: R06.00

## 2023-04-16 HISTORY — PX: URETEROSCOPY WITH HOLMIUM LASER LITHOTRIPSY: SHX6645

## 2023-04-16 HISTORY — DX: Personal history of urinary calculi: Z87.442

## 2023-04-16 HISTORY — PX: CYSTOSCOPY WITH STENT PLACEMENT: SHX5790

## 2023-04-16 LAB — GLUCOSE, CAPILLARY
Glucose-Capillary: 167 mg/dL — ABNORMAL HIGH (ref 70–99)
Glucose-Capillary: 73 mg/dL (ref 70–99)

## 2023-04-16 SURGERY — URETEROSCOPY, WITH LITHOTRIPSY USING HOLMIUM LASER
Anesthesia: General | Laterality: Left

## 2023-04-16 MED ORDER — MEPERIDINE HCL 50 MG/ML IJ SOLN: 6.2500 mg | INTRAMUSCULAR | Status: DC | PRN

## 2023-04-16 MED ORDER — FENTANYL CITRATE PF 50 MCG/ML IJ SOSY
PREFILLED_SYRINGE | INTRAMUSCULAR | Status: AC
  Filled 2023-04-16: qty 2

## 2023-04-16 MED ORDER — CHLORHEXIDINE GLUCONATE 0.12 % MT SOLN
15.0000 mL | Freq: Once | OROMUCOSAL | Status: AC
  Administered 2023-04-16: 15 mL via OROMUCOSAL

## 2023-04-16 MED ORDER — FENTANYL CITRATE PF 50 MCG/ML IJ SOSY
25.0000 ug | PREFILLED_SYRINGE | INTRAMUSCULAR | Status: DC | PRN
  Administered 2023-04-16 (×2): 50 ug via INTRAVENOUS

## 2023-04-16 MED ORDER — LIDOCAINE HCL URETHRAL/MUCOSAL 2 % EX GEL
CUTANEOUS | Status: AC
  Filled 2023-04-16: qty 30

## 2023-04-16 MED ORDER — ONDANSETRON HCL 4 MG/2ML IJ SOLN: 4.0000 mg | Freq: Once | INTRAMUSCULAR | Status: DC | PRN

## 2023-04-16 MED ORDER — ACETAMINOPHEN 325 MG PO TABS
325.0000 mg | ORAL_TABLET | ORAL | Status: DC | PRN
  Administered 2023-04-16: 650 mg via ORAL

## 2023-04-16 MED ORDER — FENTANYL CITRATE (PF) 100 MCG/2ML IJ SOLN
INTRAMUSCULAR | Status: DC | PRN
  Administered 2023-04-16 (×2): 25 ug via INTRAVENOUS

## 2023-04-16 MED ORDER — LIDOCAINE HCL (PF) 2 % IJ SOLN
INTRAMUSCULAR | Status: AC
  Filled 2023-04-16: qty 5

## 2023-04-16 MED ORDER — OXYCODONE HCL 5 MG/5ML PO SOLN: 5.0000 mg | Freq: Once | ORAL | Status: AC | PRN

## 2023-04-16 MED ORDER — PHENYLEPHRINE 80 MCG/ML (10ML) SYRINGE FOR IV PUSH (FOR BLOOD PRESSURE SUPPORT)
PREFILLED_SYRINGE | INTRAVENOUS | Status: DC | PRN
Start: 2023-04-16 — End: 2023-04-16
  Administered 2023-04-16 (×3): 160 ug via INTRAVENOUS

## 2023-04-16 MED ORDER — PHENYLEPHRINE 80 MCG/ML (10ML) SYRINGE FOR IV PUSH (FOR BLOOD PRESSURE SUPPORT)
PREFILLED_SYRINGE | INTRAVENOUS | Status: AC
  Filled 2023-04-16: qty 10

## 2023-04-16 MED ORDER — PROPOFOL 10 MG/ML IV BOLUS
INTRAVENOUS | Status: AC
  Filled 2023-04-16: qty 20

## 2023-04-16 MED ORDER — SODIUM CHLORIDE 0.9 % IV SOLN
1.0000 g | INTRAVENOUS | Status: AC
  Administered 2023-04-16: 1 g via INTRAVENOUS
  Filled 2023-04-16 (×2): qty 20

## 2023-04-16 MED ORDER — LIDOCAINE 2% (20 MG/ML) 5 ML SYRINGE
INTRAMUSCULAR | Status: DC | PRN
  Administered 2023-04-16: 100 mg via INTRAVENOUS

## 2023-04-16 MED ORDER — OXYCODONE HCL 5 MG PO TABS
5.0000 mg | ORAL_TABLET | Freq: Once | ORAL | Status: AC | PRN
  Administered 2023-04-16: 5 mg via ORAL

## 2023-04-16 MED ORDER — PROPOFOL 10 MG/ML IV BOLUS
INTRAVENOUS | Status: DC | PRN
  Administered 2023-04-16: 170 mg via INTRAVENOUS

## 2023-04-16 MED ORDER — ACETAMINOPHEN 160 MG/5ML PO SOLN: 325.0000 mg | ORAL | Status: DC | PRN

## 2023-04-16 MED ORDER — ONDANSETRON HCL 4 MG/2ML IJ SOLN
INTRAMUSCULAR | Status: DC | PRN
  Administered 2023-04-16: 4 mg via INTRAVENOUS

## 2023-04-16 MED ORDER — INSULIN ASPART 100 UNIT/ML IJ SOLN
0.0000 [IU] | INTRAMUSCULAR | Status: DC | PRN
  Administered 2023-04-16: 2 [IU] via SUBCUTANEOUS
  Filled 2023-04-16: qty 1

## 2023-04-16 MED ORDER — FENTANYL CITRATE (PF) 100 MCG/2ML IJ SOLN
INTRAMUSCULAR | Status: AC
  Filled 2023-04-16: qty 2

## 2023-04-16 MED ORDER — ORAL CARE MOUTH RINSE: 15.0000 mL | Freq: Once | OROMUCOSAL | Status: AC

## 2023-04-16 MED ORDER — MIDAZOLAM HCL 2 MG/2ML IJ SOLN
INTRAMUSCULAR | Status: AC
  Filled 2023-04-16: qty 2

## 2023-04-16 MED ORDER — DEXAMETHASONE SODIUM PHOSPHATE 10 MG/ML IJ SOLN
INTRAMUSCULAR | Status: AC
  Filled 2023-04-16: qty 1

## 2023-04-16 MED ORDER — SODIUM CHLORIDE 0.9 % IR SOLN
Status: DC | PRN
  Administered 2023-04-16: 3000 mL

## 2023-04-16 MED ORDER — LACTATED RINGERS IV SOLN
INTRAVENOUS | Status: DC
Start: 2023-04-16 — End: 2023-04-16

## 2023-04-16 MED ORDER — MIDAZOLAM HCL 5 MG/5ML IJ SOLN
INTRAMUSCULAR | Status: DC | PRN
  Administered 2023-04-16: 2 mg via INTRAVENOUS

## 2023-04-16 MED ORDER — CILOSTAZOL 100 MG PO TABS: 100.0000 mg | ORAL_TABLET | Freq: Two times a day (BID) | ORAL | 3 refills | Status: AC

## 2023-04-16 MED ORDER — 0.9 % SODIUM CHLORIDE (POUR BTL) OPTIME
TOPICAL | Status: DC | PRN
  Administered 2023-04-16: 1000 mL

## 2023-04-16 MED ORDER — CIPROFLOXACIN HCL 500 MG PO TABS: 500.0000 mg | ORAL_TABLET | Freq: Two times a day (BID) | ORAL | 0 refills | Status: AC

## 2023-04-16 MED ORDER — ACETAMINOPHEN 325 MG PO TABS
ORAL_TABLET | ORAL | Status: AC
  Filled 2023-04-16: qty 2

## 2023-04-16 MED ORDER — ONDANSETRON HCL 4 MG/2ML IJ SOLN
INTRAMUSCULAR | Status: AC
  Filled 2023-04-16: qty 2

## 2023-04-16 MED ORDER — OXYCODONE HCL 5 MG PO TABS
ORAL_TABLET | ORAL | Status: DC
Start: 2023-04-16 — End: 2023-04-16
  Filled 2023-04-16: qty 1

## 2023-04-16 SURGICAL SUPPLY — 22 items
BAG URO CATCHER STRL LF (MISCELLANEOUS) ×1 IMPLANT
BASKET ZERO TIP NITINOL 2.4FR (BASKET) IMPLANT
CATH URETERAL DUAL LUMEN 10F (MISCELLANEOUS) IMPLANT
CATH URETL OPEN END 6FR 70 (CATHETERS) IMPLANT
CLOTH BEACON ORANGE TIMEOUT ST (SAFETY) ×1 IMPLANT
FIBER LASER MOSES 200 DFL (Laser) IMPLANT
FIBER LASER MOSES 365 DFL (Laser) IMPLANT
GLOVE BIO SURGEON STRL SZ8.5 (GLOVE) ×2 IMPLANT
GOWN STRL REUS W/ TWL XL LVL3 (GOWN DISPOSABLE) ×1 IMPLANT
GUIDEWIRE AMPLATZ STIFF 0.35 (WIRE) IMPLANT
GUIDEWIRE STR DUAL SENSOR (WIRE) ×1 IMPLANT
GUIDEWIRE SUPER STIFF (WIRE) IMPLANT
GUIDEWIRE ZIPWRE .038 STRAIGHT (WIRE) IMPLANT
KIT TURNOVER KIT A (KITS) IMPLANT
LASER FIB FLEXIVA PULSE ID 365 (Laser) IMPLANT
MANIFOLD NEPTUNE II (INSTRUMENTS) ×1 IMPLANT
PACK CYSTO (CUSTOM PROCEDURE TRAY) ×1 IMPLANT
SHEATH NAVIGATOR HD 11/13X28 (SHEATH) IMPLANT
SHEATH NAVIGATOR HD 11/13X36 (SHEATH) IMPLANT
STENT URET 6FRX24 CONTOUR (STENTS) IMPLANT
TUBING CONNECTING 10 (TUBING) ×1 IMPLANT
TUBING UROLOGY SET (TUBING) ×1 IMPLANT

## 2023-04-16 NOTE — OR Nursing (Signed)
 Patient has dentures in place that "do not come out" anesthesia aware and states ok to proceed with dentures in place during surgery.

## 2023-04-16 NOTE — Anesthesia Preprocedure Evaluation (Addendum)
 Anesthesia Evaluation  Patient identified by MRN, date of birth, ID band Patient awake    Reviewed: Allergy & Precautions, NPO status , Patient's Chart, lab work & pertinent test results  History of Anesthesia Complications Negative for: history of anesthetic complications  Airway Mallampati: I  TM Distance: >3 FB Neck ROM: Full   Comment: Previous grade I view with MAC 3, easy mask Dental  (+) Edentulous Upper, Edentulous Lower Pt refuses to remove dentures:   Pulmonary neg shortness of breath, asthma (does not use inhalers) , sleep apnea (noncompliant with CPAP) , neg COPD, neg recent URI, former smoker Pulmonary nodules, ILD   Pulmonary exam normal breath sounds clear to auscultation       Cardiovascular hypertension, Pt. on medications (-) angina + CAD and + Peripheral Vascular Disease  (-) Past MI, (-) Cardiac Stents and (-) CABG + dysrhythmias (prolonged QT)  Rhythm:Regular Rate:Normal  Carotid artery occlusion Echo 04/08/2023 1. Left ventricular ejection fraction, by estimation, is 60 to 65%. The  left ventricle has normal function. The left ventricle has no regional  wall motion abnormalities. There is mild concentric left ventricular  hypertrophy. Left ventricular diastolic  parameters are consistent with Grade I diastolic dysfunction (impaired  relaxation).   2. Right ventricular systolic function is normal. The right ventricular  size is normal.   3. Left atrial size was mildly dilated.   4. The mitral valve is normal in structure. Trivial mitral valve  regurgitation. No evidence of mitral stenosis.   5. The aortic valve is tricuspid. There is mild calcification of the  aortic valve. Aortic valve regurgitation is not visualized. Aortic valve  sclerosis/calcification is present, without any evidence of aortic  stenosis.   6. The inferior vena cava is normal in size with greater than 50%  respiratory variability,  suggesting right atrial pressure of 3 mmHg.    Myocardial Perfusion 03/27/2017  Nuclear stress EF: 62%.  There was no ST segment deviation noted during stress.  No T wave inversion was noted during stress.  The study is normal.  This is a low risk study.  The left ventricular ejection fraction is normal (55-65%).    Neuro/Psych  Headaches, neg Seizures PSYCHIATRIC DISORDERS Anxiety     CVA (11/2012), No Residual Symptoms    GI/Hepatic ,GERD  ,,Hepatic steatosis   Endo/Other  diabetes, Type 2, Insulin DependentHypothyroidism    Renal/GU Renal disease (stones)     Musculoskeletal  (+) Arthritis ,    Abdominal  (+) + obese  Peds  Hematology  (+) REFUSES BLOOD PRODUCTS (refuses albumin as well), JEHOVAH'S WITNESS        Anesthesia Other Findings   Reproductive/Obstetrics                             Anesthesia Physical Anesthesia Plan  ASA: 3  Anesthesia Plan: General   Post-op Pain Management:    Induction: Intravenous  PONV Risk Score and Plan: 3 and Ondansetron, Dexamethasone and Treatment may vary due to age or medical condition  Airway Management Planned: Oral ETT and LMA  Additional Equipment: None  Intra-op Plan:   Post-operative Plan: Extubation in OR  Informed Consent: I have reviewed the patients History and Physical, chart, labs and discussed the procedure including the risks, benefits and alternatives for the proposed anesthesia with the patient or authorized representative who has indicated his/her understanding and acceptance.     Dental advisory given  Plan Discussed  with: CRNA and Anesthesiologist  Anesthesia Plan Comments: (DISCUSSION:68 y.o. former smoker with h/o asthma, ILD, sleep apnea, HTN, DM II, carotid artery disease s/p left carotid endarterectomy, CAD, Stroke, left ureteral stone scheduled for above procedure 04/16/2023 with Dr. Marcha Solders.   Pt with recent admission 2/21-2/24/2025 hydronephrosis  due to obstruction. Stent placed 03/29/23 with no anesthesia complications noted.  Pt last seen by cardiology 03/05/2023. Stable at this visit, pt without cv sx.    Echo 04/08/2023 with normal EF, no valvular problems.  Risks of general anesthesia discussed including, but not limited to, sore throat, hoarse voice, chipped/damaged teeth, injury to vocal cords, nausea and vomiting, allergic reactions, lung infection, heart attack, stroke, and death. All questions answered  Pt refuses to remove dentures )        Anesthesia Quick Evaluation

## 2023-04-16 NOTE — Anesthesia Procedure Notes (Signed)
 Procedure Name: LMA Insertion Date/Time: 04/16/2023 2:04 PM  Performed by: Florene Route, CRNAPatient Re-evaluated:Patient Re-evaluated prior to induction Oxygen Delivery Method: Circle system utilized Preoxygenation: Pre-oxygenation with 100% oxygen LMA: LMA inserted LMA Size: 4.0 Number of attempts: 1 Placement Confirmation: positive ETCO2 and breath sounds checked- equal and bilateral Tube secured with: Tape Dental Injury: Teeth and Oropharynx as per pre-operative assessment

## 2023-04-16 NOTE — Interval H&P Note (Signed)
 History and Physical Interval Note:  04/16/2023 12:14 PM  [name]  has presented today for surgery, with the diagnosis of left ureteral stone.  The various methods of treatment have been discussed with the patient and family. After consideration of risks, benefits and other options for treatment, the patient has consented to  Procedure(s): URETEROSCOPY, WITH LITHOTRIPSY USING HOLMIUM LASER (Left) CYSTOSCOPY, WITH STENT INSERTION (Left) as a surgical intervention.  The patient's history has been reviewed, patient examined, no change in status, stable for surgery.  I have reviewed the patient's chart and labs.  Questions were answered to the patient's satisfaction.     Joline Maxcy

## 2023-04-16 NOTE — Op Note (Signed)
 OPERATIVE NOTE   Patient Name: [name]  MRN: [id number]   Date of Procedure: 04/16/23    Preoperative diagnosis:  Left ureteral calculus  Postoperative diagnosis:  same  Procedure:  Cystoscopy, ureteroscopy, laser lithotripsy, left dj stent exchange  Attending: Concepcion Living, MD  Anesthesia: general/LMA  Estimated blood loss: minimal  Fluids: Per anesthesia record  Drains: 6x24cm dj stent   Specimens: none  Antibiotics: merropenam  Findings: 6mm left mid ureteral stone  Indications:  Left ureteral stone  Description of Procedure:  Patient was brought to the operating room where she was correctly identified and subsequently underwent satisfactory induction of general anesthesia via LMA.  Patient was then positioned in the modified dorsal lithotomy position and her external genitalia were prepped and draped in the usual sterile fashion.  Preprocedural timeout was performed.  21 French panendoscope was subsequently inserted per urethra.  The bladder was carefully inspected.  There was noted to be some inflammatory changes especially on the trigone around the left double-J stent.  No other significant mucosal lesions.  Under endoscopic and fluoroscopic control I subsequently placed a safety wire alongside the existing stent with a nice coil obtained within the renal collecting system.  After the safety wire was in place, the indwelling stent was removed using flexible graspers.  At this time I deployed the semirigid ureteroscope.  I was able to easily enter the ureter which was nicely dilated.  The stone was encountered in the mid ureter at the level of the L3-L4 junction.  I deployed the 365 m Moses laser fiber and used the lithotripsy settings to fragment the stone into multiple small pieces.  I did use the 0 tip nitinol basket to remove the larger fragments.  There were no significant residual fragments upon completion of the case.  At this time after the  ureteroscope was removed I backloaded the safety wire over the cystoscope and under endoscopic and fluoroscopic control placed a new 6 x 24 cm double-J stent with tether subsequently tucked into the vagina.  Good position was obtained proximally and distally.  The patient tolerated the procedure well.  No apparent complications.  Complications: None  Condition: Stable, extubated, transferred to PACU  Plan: FU 1 week for stent removal via tether

## 2023-04-16 NOTE — Transfer of Care (Signed)
 Immediate Anesthesia Transfer of Care Note  Patient: [name]  Procedure(s) Performed: URETEROSCOPY, WITH LITHOTRIPSY USING HOLMIUM LASER (Left) CYSTOSCOPY, WITH STENT INSERTION (Left)  Patient Location: PACU  Anesthesia Type:General  Level of Consciousness: drowsy  Airway & Oxygen Therapy: Patient Spontanous Breathing and Patient connected to face mask oxygen  Post-op Assessment: Report given to RN and Post -op Vital signs reviewed and stable  Post vital signs: Reviewed and stable  Last Vitals:  Vitals Value Taken Time  BP 143/70 04/16/23 1500  Temp 36.5 C 04/16/23 1500  Pulse 64 04/16/23 1503  Resp 19 04/16/23 1503  SpO2 100 % 04/16/23 1503  Vitals shown include unfiled device data.  Last Pain:  Vitals:   04/16/23 1500  TempSrc:   PainSc: 0-No pain         Complications: No notable events documented.

## 2023-04-16 NOTE — Addendum Note (Signed)
 Addendum  created 04/16/23 1514 by Florene Route, CRNA   Intraprocedure Event edited

## 2023-04-16 NOTE — Anesthesia Postprocedure Evaluation (Signed)
 Anesthesia Post Note  Patient: [name]  Procedure(s) Performed: URETEROSCOPY, WITH LITHOTRIPSY USING HOLMIUM LASER (Left) CYSTOSCOPY, WITH STENT INSERTION (Left)     Patient location during evaluation: PACU Anesthesia Type: General Level of consciousness: awake and alert Pain management: pain level controlled Vital Signs Assessment: post-procedure vital signs reviewed and stable Respiratory status: spontaneous breathing, nonlabored ventilation, respiratory function stable and patient connected to nasal cannula oxygen Cardiovascular status: blood pressure returned to baseline and stable Postop Assessment: no apparent nausea or vomiting Anesthetic complications: no   No notable events documented.  Last Vitals:  Vitals:   04/16/23 1206 04/16/23 1500  BP: 121/70 (!) 143/70  Pulse: 70 66  Resp: 16 20  Temp: 36.7 C 36.5 C  SpO2: 98% 98%    Last Pain:  Vitals:   04/16/23 1500  TempSrc:   PainSc: 0-No pain                 Talibah Colasurdo

## 2023-04-17 ENCOUNTER — Telehealth: Payer: Self-pay | Admitting: Urology

## 2023-04-17 ENCOUNTER — Other Ambulatory Visit: Payer: Self-pay | Admitting: Urology

## 2023-04-17 ENCOUNTER — Encounter (HOSPITAL_COMMUNITY): Payer: Self-pay | Admitting: Urology

## 2023-04-17 MED ORDER — OXYBUTYNIN CHLORIDE 5 MG PO TABS: 5.0000 mg | ORAL_TABLET | Freq: Three times a day (TID) | ORAL | 0 refills | Status: DC | PRN

## 2023-04-17 NOTE — Telephone Encounter (Signed)
 Pt called and wanted to know if Dr Margo Aye can send in pain medication. She is having pain in her lower abdomen after surgery yesterday.

## 2023-04-18 ENCOUNTER — Emergency Department (HOSPITAL_COMMUNITY)
Admission: EM | Admit: 2023-04-18 | Discharge: 2023-04-18 | Disposition: A | Discharge status: Home / Self Care | Attending: Student | Admitting: Student

## 2023-04-18 ENCOUNTER — Encounter (HOSPITAL_COMMUNITY): Payer: Self-pay

## 2023-04-18 ENCOUNTER — Other Ambulatory Visit: Payer: Self-pay

## 2023-04-18 DIAGNOSIS — I10 Essential (primary) hypertension: Secondary | ICD-10-CM | POA: Insufficient documentation

## 2023-04-18 DIAGNOSIS — T83122A Displacement of urinary stent, initial encounter: Secondary | ICD-10-CM | POA: Insufficient documentation

## 2023-04-18 DIAGNOSIS — I2510 Atherosclerotic heart disease of native coronary artery without angina pectoris: Secondary | ICD-10-CM | POA: Insufficient documentation

## 2023-04-18 DIAGNOSIS — Z79899 Other long term (current) drug therapy: Secondary | ICD-10-CM | POA: Insufficient documentation

## 2023-04-18 DIAGNOSIS — E039 Hypothyroidism, unspecified: Secondary | ICD-10-CM | POA: Insufficient documentation

## 2023-04-18 DIAGNOSIS — Z794 Long term (current) use of insulin: Secondary | ICD-10-CM | POA: Insufficient documentation

## 2023-04-18 DIAGNOSIS — E119 Type 2 diabetes mellitus without complications: Secondary | ICD-10-CM | POA: Diagnosis not present

## 2023-04-18 DIAGNOSIS — J45909 Unspecified asthma, uncomplicated: Secondary | ICD-10-CM | POA: Diagnosis not present

## 2023-04-18 DIAGNOSIS — Z8673 Personal history of transient ischemic attack (TIA), and cerebral infarction without residual deficits: Secondary | ICD-10-CM | POA: Diagnosis not present

## 2023-04-18 DIAGNOSIS — Z7982 Long term (current) use of aspirin: Secondary | ICD-10-CM | POA: Diagnosis not present

## 2023-04-18 DIAGNOSIS — Z7901 Long term (current) use of anticoagulants: Secondary | ICD-10-CM | POA: Insufficient documentation

## 2023-04-18 DIAGNOSIS — Y732 Prosthetic and other implants, materials and accessory gastroenterology and urology devices associated with adverse incidents: Secondary | ICD-10-CM | POA: Diagnosis not present

## 2023-04-18 LAB — CBC WITH DIFFERENTIAL/PLATELET
Abs Immature Granulocytes: 0.01 10*3/uL (ref 0.00–0.07)
Basophils Absolute: 0 10*3/uL (ref 0.0–0.1)
Basophils Relative: 1 %
Eosinophils Absolute: 0.2 10*3/uL (ref 0.0–0.5)
Eosinophils Relative: 5 %
HCT: 45.8 % (ref 36.0–46.0)
Hemoglobin: 14.4 g/dL (ref 12.0–15.0)
Immature Granulocytes: 0 %
Lymphocytes Relative: 39 %
Lymphs Abs: 1.8 10*3/uL (ref 0.7–4.0)
MCH: 26.4 pg (ref 26.0–34.0)
MCHC: 31.4 g/dL (ref 30.0–36.0)
MCV: 83.9 fL (ref 80.0–100.0)
Monocytes Absolute: 0.4 10*3/uL (ref 0.1–1.0)
Monocytes Relative: 9 %
Neutro Abs: 2.2 10*3/uL (ref 1.7–7.7)
Neutrophils Relative %: 46 %
Platelets: 257 10*3/uL (ref 150–400)
RBC: 5.46 MIL/uL — ABNORMAL HIGH (ref 3.87–5.11)
RDW: 14.6 % (ref 11.5–15.5)
WBC: 4.7 10*3/uL (ref 4.0–10.5)
nRBC: 0 % (ref 0.0–0.2)

## 2023-04-18 LAB — BASIC METABOLIC PANEL
Anion gap: 8 (ref 5–15)
BUN: 9 mg/dL (ref 8–23)
CO2: 24 mmol/L (ref 22–32)
Calcium: 9.6 mg/dL (ref 8.9–10.3)
Chloride: 105 mmol/L (ref 98–111)
Creatinine, Ser: 1.05 mg/dL — ABNORMAL HIGH (ref 0.44–1.00)
GFR, Estimated: 58 mL/min — ABNORMAL LOW (ref >60)
Glucose, Bld: 150 mg/dL — ABNORMAL HIGH (ref 70–99)
Potassium: 3.8 mmol/L (ref 3.5–5.1)
Sodium: 137 mmol/L (ref 135–145)
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
## 2023-04-18 NOTE — Telephone Encounter (Signed)
 Attempted to call patient but noticed in her chart that she was in the ED.

## 2023-04-18 NOTE — ED Triage Notes (Signed)
 Patient was here on Tuesday, had stent placed for kidney stone. Patient stated stent fell out and now she is "peeing everywhere" unable to control it. A&Ox4, denies blood in urine. Denies taking blood thinners or antibiotics at this time. Afebrile.

## 2023-04-19 ENCOUNTER — Telehealth: Payer: Self-pay

## 2023-04-19 NOTE — Telephone Encounter (Signed)
 Spoke with patient, she is aware of Dr Scharlene Gloss recommendation and the need to follow up next week.

## 2023-04-19 NOTE — Telephone Encounter (Signed)
 Pt called to let us know that she was in so much pain with the stent that she went to the ED and they pulled the stent. She would like to know what the next steps are? She is scheduled for follow up on Tuesday.

## 2023-04-23 ENCOUNTER — Encounter: Payer: Self-pay | Admitting: Urology

## 2023-04-23 ENCOUNTER — Ambulatory Visit (INDEPENDENT_AMBULATORY_CARE_PROVIDER_SITE_OTHER): Admitting: Urology

## 2023-04-23 VITALS — BP 117/74 | HR 73

## 2023-04-23 DIAGNOSIS — Z87440 Personal history of urinary (tract) infections: Secondary | ICD-10-CM

## 2023-04-23 DIAGNOSIS — N390 Urinary tract infection, site not specified: Secondary | ICD-10-CM

## 2023-04-23 DIAGNOSIS — Z8742 Personal history of other diseases of the female genital tract: Secondary | ICD-10-CM

## 2023-04-23 DIAGNOSIS — Z09 Encounter for follow-up examination after completed treatment for conditions other than malignant neoplasm: Secondary | ICD-10-CM

## 2023-04-23 DIAGNOSIS — N200 Calculus of kidney: Secondary | ICD-10-CM | POA: Diagnosis not present

## 2023-04-23 LAB — URINALYSIS, ROUTINE W REFLEX MICROSCOPIC
Bilirubin, UA: NEGATIVE
Glucose, UA: NEGATIVE
Ketones, UA: NEGATIVE
Leukocytes,UA: NEGATIVE
Nitrite, UA: NEGATIVE
RBC, UA: NEGATIVE
Specific Gravity, UA: 1.025 (ref 1.005–1.030)
Urobilinogen, Ur: 0.2 mg/dL (ref 0.2–1.0)
pH, UA: 6.5 (ref 5.0–7.5)

## 2023-04-23 LAB — MICROSCOPIC EXAMINATION

## 2023-04-23 MED ORDER — METHENAMINE HIPPURATE 1 G PO TABS: 1.0000 g | ORAL_TABLET | Freq: Two times a day (BID) | ORAL | 3 refills | Status: DC

## 2023-04-23 MED ORDER — ESTRADIOL 0.1 MG/GM VA CREA: TOPICAL_CREAM | VAGINAL | 12 refills | Status: AC
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
## 2023-04-29 ENCOUNTER — Telehealth (HOSPITAL_BASED_OUTPATIENT_CLINIC_OR_DEPARTMENT_OTHER): Payer: Self-pay | Admitting: Primary Care

## 2023-04-29 NOTE — Telephone Encounter (Signed)
 I called the Radiology reading room to try and reach Jane. I was unable to reach her, but a radiologist stated she would let Erskine Squibb know to all me back. NFN until Erskine Squibb calls.

## 2023-04-29 NOTE — Telephone Encounter (Signed)
 Erskine Squibb calling with a STAT call report. Please call back.  May already have the report in the chart but Still needs a call back so office can document-  [phone]

## 2023-04-30 ENCOUNTER — Telehealth: Payer: Self-pay | Admitting: Emergency Medicine

## 2023-04-30 DIAGNOSIS — R911 Solitary pulmonary nodule: Secondary | ICD-10-CM | POA: Insufficient documentation

## 2023-04-30 NOTE — Telephone Encounter (Signed)
 Need to review CT chest with patient and set up bronchoscopy

## 2023-04-30 NOTE — Telephone Encounter (Signed)
 The CT is in pt's chart. I called Radiology and they said nothing further was needed and report is in the chart.

## 2023-04-30 NOTE — Progress Notes (Signed)
 Patient of yours with sarcoid. CT showed progressive fibrotic lung disease ans well as an enlarging apical right upper lobe nodule, worrisome for primary bronchogenic carcinoma.   I have spoken with patient to review results and she is open to further diagnostic work up and proceeding with bronchoscopy for lung biopsy if needed  Dr. Delton Coombes can you set up a time for biopsy

## 2023-05-01 NOTE — Telephone Encounter (Signed)
 Orders placed to arrange for robotic assisted navigational bronchoscopy to evaluate a right upper lobe nodule.  Patient will need to hold aspirin for 2 days, Pletal for 5 days, Mounjaro for 1 week.

## 2023-05-01 NOTE — Telephone Encounter (Signed)
 Routing to [facility] [place]

## 2023-05-02 ENCOUNTER — Encounter: Payer: Self-pay | Admitting: Emergency Medicine

## 2023-05-13 ENCOUNTER — Encounter (HOSPITAL_COMMUNITY): Payer: Self-pay | Admitting: Emergency Medicine

## 2023-05-13 ENCOUNTER — Other Ambulatory Visit: Payer: Self-pay

## 2023-05-13 NOTE — Progress Notes (Signed)
 PCP - Mila Palmer, MD  Cardiologist - Wendall Stade, MD   PPM/ICD - denies Device Orders - n/a Rep Notified - n/a  Chest x-ray - 03-13-22 EKG - 03-05-23 Stress Test - 12-30-12 ECHO - 04-08-23 Cardiac Cath - 2015  CPAP - Sleep test 08-13-19 unable to tolerate CPAP  GLP-1 -MOUNJARO Last dose 05-05-23  Fasting Blood Sugar - per patient this am 155 Checks Blood Sugar Dexacom 7 to abdomen  Blood Thinner Instructions: cilostazol (PLETAL)05-06-23  Aspirin Instructions: aspirin 05-11-23  ERAS Protcol - NPO  COVID TEST- n/a  Anesthesia review: yes  Patient verbally denies any shortness of breath, fever, cough and chest pain during phone call   -------------  SDW INSTRUCTIONS given:  Your procedure is scheduled on May 14, 2023.  Report to [facility] Main Entrance "A" at 5:30 A.M., and check in at the Admitting office.  Call this number if you have problems the morning of surgery:  [phone]   Remember:  Do not eat or drink  after midnight the night before your surgery      Take these medicines the morning of surgery with A SIP OF WATER  acetaminophen-codeine (TYLENOL #3)  albuterol (PROVENTIL)  amLODipine (NORVASC)  atenolol (TENORMIN)  dicyclomine (BENTYL)  levothyroxine (SYNTHROID)  ondansetron (ZOFRAN)  oxybutynin (DITROPAN)  pantoprazole (PROTONIX)  PROAIR  rosuvastatin (CRESTOR)    As of today, STOP taking any Aspirin (unless otherwise instructed by your surgeon) Aleve, Naproxen, Ibuprofen, Motrin, Advil, Goody's, BC's, all herbal medications, fish oil, and all vitamins.  WHAT DO I DO ABOUT MY DIABETES MEDICATION?   Do not take oral diabetes medicines (pills) the morning of surgery.  THE NIGHT BEFORE SURGERY, take 28 units of NOVOLOG 70/30 MIX insulin.       THE MORNING OF SURGERY, take 0 units of NOVOLOG 70/30 MIX insulin.  The day of surgery, do not take other diabetes injectables, including Byetta (exenatide), Bydureon (exenatide ER), Victoza  (liraglutide), or Trulicity (dulaglutide).  If your CBG is greater than 220 mg/dL, you may take  of your sliding scale (correction) dose of insulin.   HOW TO MANAGE YOUR DIABETES BEFORE AND AFTER SURGERY  Why is it important to control my blood sugar before and after surgery? Improving blood sugar levels before and after surgery helps healing and can limit problems. A way of improving blood sugar control is eating a healthy diet by:  Eating less sugar and carbohydrates  Increasing activity/exercise  Talking with your doctor about reaching your blood sugar goals High blood sugars (greater than 180 mg/dL) can raise your risk of infections and slow your recovery, so you will need to focus on controlling your diabetes during the weeks before surgery. Make sure that the doctor who takes care of your diabetes knows about your planned surgery including the date and location.  How do I manage my blood sugar before surgery? Check your blood sugar at least 4 times a day, starting 2 days before surgery, to make sure that the level is not too high or low.  Check your blood sugar the morning of your surgery when you wake up and every 2 hours until you get to the Short Stay unit.  If your blood sugar is less than 70 mg/dL, you will need to treat for low blood sugar: Do not take insulin. Treat a low blood sugar (less than 70 mg/dL) with  cup of clear juice (cranberry or apple), 4 glucose tablets, OR glucose gel. Recheck blood sugar in 15  minutes after treatment (to make sure it is greater than 70 mg/dL). If your blood sugar is not greater than 70 mg/dL on recheck, call [phone] for further instructions. Report your blood sugar to the short stay nurse when you get to Short Stay.  If you are admitted to the hospital after surgery: Your blood sugar will be checked by the staff and you will probably be given insulin after surgery (instead of oral diabetes medicines) to make sure you have good blood  sugar levels. The goal for blood sugar control after surgery is 80-180 mg/dL.                      Do not wear jewelry, make up, or nail polish            Do not wear lotions, powders, perfumes/colognes, or deodorant.            Do not shave 48 hours prior to surgery.  Men may shave face and neck.            Do not bring valuables to the hospital.            [facility] is not responsible for any belongings or valuables.  Do NOT Smoke (Tobacco/Vaping) 24 hours prior to your procedure If you use a CPAP at night, you may bring all equipment for your overnight stay.   Contacts, glasses, dentures or bridgework may not be worn into surgery.      For patients admitted to the hospital, discharge time will be determined by your treatment team.   Patients discharged the day of surgery will not be allowed to drive home, and someone needs to stay with them for 24 hours.    Special instructions:   [facility]- Preparing For Surgery  Before surgery, you can play an important role. Because skin is not sterile, your skin needs to be as free of germs as possible. You can reduce the number of germs on your skin by washing with CHG (chlorahexidine gluconate) Soap before surgery.  CHG is an antiseptic cleaner which kills germs and bonds with the skin to continue killing germs even after washing.    Oral Hygiene is also important to reduce your risk of infection.  Remember - BRUSH YOUR TEETH THE MORNING OF SURGERY WITH YOUR REGULAR TOOTHPASTE  Please do not use if you have an allergy to CHG or antibacterial soaps. If your skin becomes reddened/irritated stop using the CHG.  Do not shave (including legs and underarms) for at least 48 hours prior to first CHG shower. It is OK to shave your face.  Please follow these instructions carefully.   Shower the NIGHT BEFORE SURGERY and the MORNING OF SURGERY with DIAL Soap.   Pat yourself dry with a CLEAN TOWEL.  Wear CLEAN PAJAMAS to bed the night before  surgery  Place CLEAN SHEETS on your bed the night of your first shower and DO NOT SLEEP WITH PETS.   Day of Surgery: Please shower morning of surgery  Wear Clean/Comfortable clothing the morning of surgery Do not apply any deodorants/lotions.   Remember to brush your teeth WITH YOUR REGULAR TOOTHPASTE.   Questions were answered. Patient verbalized understanding of instructions.

## 2023-05-14 ENCOUNTER — Ambulatory Visit (HOSPITAL_COMMUNITY)
Admission: RE | Admit: 2023-05-14 | Discharge: 2023-05-14 | Disposition: A | Discharge status: Home / Self Care | Attending: Emergency Medicine | Admitting: Emergency Medicine

## 2023-05-14 ENCOUNTER — Other Ambulatory Visit: Payer: Self-pay

## 2023-05-14 ENCOUNTER — Encounter (HOSPITAL_COMMUNITY)
Admission: RE | Disposition: A | Discharge status: Home / Self Care | Payer: Self-pay | Source: Home / Self Care | Attending: Emergency Medicine

## 2023-05-14 ENCOUNTER — Ambulatory Visit (HOSPITAL_COMMUNITY)

## 2023-05-14 ENCOUNTER — Ambulatory Visit (HOSPITAL_BASED_OUTPATIENT_CLINIC_OR_DEPARTMENT_OTHER): Payer: Self-pay | Admitting: Certified Registered"

## 2023-05-14 ENCOUNTER — Ambulatory Visit (HOSPITAL_COMMUNITY): Payer: Self-pay | Admitting: Certified Registered"

## 2023-05-14 ENCOUNTER — Encounter (HOSPITAL_COMMUNITY): Payer: Self-pay | Admitting: Emergency Medicine

## 2023-05-14 DIAGNOSIS — E119 Type 2 diabetes mellitus without complications: Secondary | ICD-10-CM | POA: Diagnosis not present

## 2023-05-14 DIAGNOSIS — J45909 Unspecified asthma, uncomplicated: Secondary | ICD-10-CM | POA: Diagnosis not present

## 2023-05-14 DIAGNOSIS — I2510 Atherosclerotic heart disease of native coronary artery without angina pectoris: Secondary | ICD-10-CM

## 2023-05-14 DIAGNOSIS — G4733 Obstructive sleep apnea (adult) (pediatric): Secondary | ICD-10-CM | POA: Insufficient documentation

## 2023-05-14 DIAGNOSIS — R0989 Other specified symptoms and signs involving the circulatory and respiratory systems: Secondary | ICD-10-CM | POA: Diagnosis not present

## 2023-05-14 DIAGNOSIS — R590 Localized enlarged lymph nodes: Secondary | ICD-10-CM | POA: Insufficient documentation

## 2023-05-14 DIAGNOSIS — K760 Fatty (change of) liver, not elsewhere classified: Secondary | ICD-10-CM | POA: Diagnosis not present

## 2023-05-14 DIAGNOSIS — R140 Abdominal distension (gaseous): Secondary | ICD-10-CM | POA: Diagnosis not present

## 2023-05-14 DIAGNOSIS — Z794 Long term (current) use of insulin: Secondary | ICD-10-CM | POA: Diagnosis not present

## 2023-05-14 DIAGNOSIS — Z48813 Encounter for surgical aftercare following surgery on the respiratory system: Secondary | ICD-10-CM | POA: Diagnosis not present

## 2023-05-14 DIAGNOSIS — Z87891 Personal history of nicotine dependence: Secondary | ICD-10-CM | POA: Insufficient documentation

## 2023-05-14 DIAGNOSIS — K219 Gastro-esophageal reflux disease without esophagitis: Secondary | ICD-10-CM | POA: Diagnosis not present

## 2023-05-14 DIAGNOSIS — I10 Essential (primary) hypertension: Secondary | ICD-10-CM | POA: Diagnosis not present

## 2023-05-14 DIAGNOSIS — C3411 Malignant neoplasm of upper lobe, right bronchus or lung: Secondary | ICD-10-CM | POA: Insufficient documentation

## 2023-05-14 DIAGNOSIS — Z7985 Long-term (current) use of injectable non-insulin antidiabetic drugs: Secondary | ICD-10-CM | POA: Insufficient documentation

## 2023-05-14 DIAGNOSIS — R911 Solitary pulmonary nodule: Secondary | ICD-10-CM | POA: Diagnosis present

## 2023-05-14 DIAGNOSIS — J849 Interstitial pulmonary disease, unspecified: Secondary | ICD-10-CM | POA: Diagnosis not present

## 2023-05-14 HISTORY — PX: BRONCHIAL BIOPSY: SHX5109

## 2023-05-14 HISTORY — PX: VIDEO BRONCHOSCOPY WITH ENDOBRONCHIAL NAVIGATION: SHX6175

## 2023-05-14 HISTORY — PX: BRONCHIAL BRUSHINGS: SHX5108

## 2023-05-14 HISTORY — PX: VIDEO BRONCHOSCOPY WITH ENDOBRONCHIAL ULTRASOUND: SHX6177

## 2023-05-14 HISTORY — PX: BRONCHIAL NEEDLE ASPIRATION BIOPSY: SHX5106

## 2023-05-14 LAB — GLUCOSE, CAPILLARY
Glucose-Capillary: 184 mg/dL — ABNORMAL HIGH (ref 70–99)
Glucose-Capillary: 138 mg/dL — ABNORMAL HIGH (ref 70–99)

## 2023-05-14 LAB — NO BLOOD PRODUCTS

## 2023-05-14 SURGERY — VIDEO BRONCHOSCOPY WITH ENDOBRONCHIAL NAVIGATION
Anesthesia: General | Laterality: Right

## 2023-05-14 MED ORDER — ONDANSETRON HCL 4 MG/2ML IJ SOLN
INTRAMUSCULAR | Status: DC | PRN
  Administered 2023-05-14: 4 mg via INTRAVENOUS

## 2023-05-14 MED ORDER — FENTANYL CITRATE (PF) 100 MCG/2ML IJ SOLN
INTRAMUSCULAR | Status: AC
  Filled 2023-05-14: qty 2

## 2023-05-14 MED ORDER — FENTANYL CITRATE (PF) 100 MCG/2ML IJ SOLN: 25.0000 ug | INTRAMUSCULAR | Status: DC | PRN

## 2023-05-14 MED ORDER — ACETAMINOPHEN 500 MG PO TABS
1000.0000 mg | ORAL_TABLET | Freq: Once | ORAL | Status: AC
  Administered 2023-05-14: 1000 mg via ORAL
  Filled 2023-05-14: qty 2

## 2023-05-14 MED ORDER — PANTOPRAZOLE SODIUM 40 MG PO TBEC
40.0000 mg | DELAYED_RELEASE_TABLET | Freq: Every day | ORAL | Status: DC | PRN
Start: 2023-05-14 — End: 2023-06-16

## 2023-05-14 MED ORDER — EPHEDRINE SULFATE-NACL 50-0.9 MG/10ML-% IV SOSY
PREFILLED_SYRINGE | INTRAVENOUS | Status: DC | PRN
  Administered 2023-05-14: 5 mg via INTRAVENOUS

## 2023-05-14 MED ORDER — DROPERIDOL 2.5 MG/ML IJ SOLN: 0.6250 mg | Freq: Once | INTRAMUSCULAR | Status: DC | PRN

## 2023-05-14 MED ORDER — CHLORHEXIDINE GLUCONATE 0.12 % MT SOLN
15.0000 mL | Freq: Once | OROMUCOSAL | Status: AC
  Administered 2023-05-14: 15 mL via OROMUCOSAL
  Filled 2023-05-14: qty 15

## 2023-05-14 MED ORDER — LIDOCAINE 2% (20 MG/ML) 5 ML SYRINGE
INTRAMUSCULAR | Status: DC | PRN
  Administered 2023-05-14: 100 mg via INTRAVENOUS
  Administered 2023-05-14: 10 mg via INTRAVENOUS

## 2023-05-14 MED ORDER — PROPOFOL 10 MG/ML IV BOLUS
INTRAVENOUS | Status: DC | PRN
Start: 2023-05-14 — End: 2023-05-14
  Administered 2023-05-14: 20 mg via INTRAVENOUS
  Administered 2023-05-14: 150 mg via INTRAVENOUS

## 2023-05-14 MED ORDER — SUGAMMADEX SODIUM 200 MG/2ML IV SOLN
INTRAVENOUS | Status: DC | PRN
  Administered 2023-05-14: 200 mg via INTRAVENOUS

## 2023-05-14 MED ORDER — PROPOFOL 500 MG/50ML IV EMUL
INTRAVENOUS | Status: DC | PRN
  Administered 2023-05-14: 150 ug/kg/min via INTRAVENOUS

## 2023-05-14 MED ORDER — INSULIN ASPART 100 UNIT/ML IJ SOLN
0.0000 [IU] | INTRAMUSCULAR | Status: DC | PRN
  Administered 2023-05-14: 4 [IU] via SUBCUTANEOUS
  Filled 2023-05-14: qty 1

## 2023-05-14 MED ORDER — PHENYLEPHRINE 80 MCG/ML (10ML) SYRINGE FOR IV PUSH (FOR BLOOD PRESSURE SUPPORT)
PREFILLED_SYRINGE | INTRAVENOUS | Status: DC | PRN
  Administered 2023-05-14 (×2): 80 ug via INTRAVENOUS
  Administered 2023-05-14: 160 ug via INTRAVENOUS
  Administered 2023-05-14 (×2): 80 ug via INTRAVENOUS
  Administered 2023-05-14: 40 ug via INTRAVENOUS
  Administered 2023-05-14 (×2): 160 ug via INTRAVENOUS

## 2023-05-14 MED ORDER — LACTATED RINGERS IV SOLN: INTRAVENOUS | Status: DC

## 2023-05-14 MED ORDER — ROCURONIUM BROMIDE 10 MG/ML (PF) SYRINGE
PREFILLED_SYRINGE | INTRAVENOUS | Status: DC | PRN
  Administered 2023-05-14: 70 mg via INTRAVENOUS

## 2023-05-14 NOTE — Transfer of Care (Cosign Needed)
 Immediate Anesthesia Transfer of Care Note  Patient: [name]  Procedure(s) Performed: VIDEO BRONCHOSCOPY WITH ENDOBRONCHIAL NAVIGATION (Right) BRONCHOSCOPY, WITH EBUS BRONCHOSCOPY, WITH BIOPSY BRONCHOSCOPY, WITH BRUSH BIOPSY BRONCHOSCOPY, WITH NEEDLE ASPIRATION BIOPSY  Patient Location: PACU  Anesthesia Type:General  Level of Consciousness: awake, oriented, and patient cooperative  Airway & Oxygen Therapy: Patient Spontanous Breathing and Patient connected to face mask oxygen  Post-op Assessment: Report given to RN and Post -op Vital signs reviewed and stable  Post vital signs: Reviewed and stable  Last Vitals:  Vitals Value Taken Time  BP    Temp    Pulse 63 05/14/23 0856  Resp 15 05/14/23 0856  SpO2 95 % 05/14/23 0856  Vitals shown include unfiled device data.  Last Pain:  Vitals:   05/14/23 0626  TempSrc:   PainSc: 0-No pain      Patients Stated Pain Goal: 0 (05/14/23 1610)  Complications: No notable events documented.
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
## 2023-05-14 NOTE — Op Note (Signed)
 Video Bronchoscopy with Robotic Assisted Bronchoscopic Navigation and Endobronchial Ultrasound Procedure Note  Date of Operation: 05/14/2023   Pre-op Diagnosis: Right upper lobe nodule, mediastinal adenopathy  Post-op Diagnosis: Same  Surgeon: Levy Pupa  Assistants: None  Anesthesia: General endotracheal anesthesia  Operation: Flexible video fiberoptic bronchoscopy with robotic assistance and biopsies.  Estimated Blood Loss: Minimal  Complications: None  Indications and History: [name] is a 68 y.o. female with history of former tobacco use, interstitial infiltrates and nodular disease on CT chest, mediastinal adenopathy, possible sarcoidosis.  Surveillance imaging showed an increased right upper lobe nodule.  Recommendation made to achieve a tissue diagnosis via robotic assisted navigational bronchoscopy, endobronchial ultrasound and biopsies. The risks, benefits, complications, treatment options and expected outcomes were discussed with the patient.  The possibilities of pneumothorax, pneumonia, reaction to medication, pulmonary aspiration, perforation of a viscus, bleeding, failure to diagnose a condition and creating a complication requiring transfusion or operation were discussed with the patient who freely signed the consent.    Description of Procedure: The patient was seen in the Preoperative Area, was examined and was deemed appropriate to proceed.  The patient was taken to [facility] endoscopy room 3, identified as [name] and the procedure verified as Flexible Video Fiberoptic Bronchoscopy.  A Time Out was held and the above information confirmed.   Prior to the date of the procedure a high-resolution CT scan of the chest was performed. Utilizing ION software program a virtual tracheobronchial tree was generated to allow the creation of distinct navigation pathways to the patient's parenchymal abnormalities. After being taken to the operating room general anesthesia was  initiated and the patient  was orally intubated. The video fiberoptic bronchoscope was introduced via the endotracheal tube and a general inspection was performed which showed normal right and left lung anatomy. Aspiration of the bilateral mainstems was completed to remove any remaining secretions. Robotic catheter inserted into patient's endotracheal tube.   Target #1 right upper lobe nodule: The distinct navigation pathways prepared prior to this procedure were then utilized to navigate to patient's lesion identified on CT scan. The robotic catheter was secured into place and the vision probe was withdrawn.  Lesion location was approximated using fluoroscopy.  Local registration and targeting was performed using Cios three-dimensional imaging. Under fluoroscopic guidance transbronchial needle brushings, transbronchial needle biopsies, and transbronchial forceps biopsies were performed to be sent for cytology and pathology.  Needle in lesion was confirmed using Cios.    The robotic scope was then withdrawn and the endobronchial ultrasound was used to identify and characterize the peritracheal, hilar and bronchial lymph nodes. Inspection showed enlargement at station 4R, station 7, station 11R. Using real-time ultrasound guidance Wang needle biopsies were take from Station 4R, 7, 11R nodes and were sent for cytology.   At the end of the procedure a general airway inspection was performed and there was no evidence of active bleeding. The bronchoscope was removed.  The patient tolerated the procedure well. There was no significant blood loss and there were no obvious complications. A post-procedural chest x-ray is pending.  Samples Target #1: 1. Transbronchial needle brushings from right upper lobe nodule 2. Transbronchial Wang needle biopsies from right upper lobe nodule 3. Transbronchial forceps biopsies from right upper lobe nodule  EBUS samples: 1. Wang needle biopsies from 4R node 2. Wang needle  biopsies from 7 node 3. Wang needle biopsies from 11R node   Plans:  The patient will be discharged from the PACU to home  when recovered from anesthesia and after chest x-ray is reviewed. We will review the cytology, pathology and microbiology results with the patient when they become available. Outpatient followup will be with Dr. Delton Coombes.   Levy Pupa, MD, PhD 05/14/2023, 8:49 AM [facility] [phone] or if no answer before 7:00PM call [phone] For any issues after 7:00PM please call eLink [phone]

## 2023-05-14 NOTE — Discharge Instructions (Addendum)
 Flexible Bronchoscopy, Care After This sheet gives you information about how to care for yourself after your test. Your doctor may also give you more specific instructions. If you have problems or questions, contact your doctor. Follow these instructions at home: Eating and drinking When your numbness is gone and your cough and gag reflexes have come back, you may: Eat only soft foods. Slowly drink liquids. When you get home after the test, go back to your normal diet. Driving Do not drive for 24 hours if you were given a medicine to help you relax (sedative). Do not drive or use heavy machinery while taking prescription pain medicine. General instructions  Take over-the-counter and prescription medicines only as told by your doctor. Return to your normal activities as told. Ask what activities are safe for you. Do not use any products that have nicotine or tobacco in them. This includes cigarettes and e-cigarettes. If you need help quitting, ask your doctor. Keep all follow-up visits as told by your doctor. This is important. It is very important if you had a tissue sample (biopsy) taken. Get help right away if: You have shortness of breath that gets worse. You get light-headed. You feel like you are going to pass out (faint). You have chest pain. You cough up: More than a little blood. More blood than before. Summary Do not eat or drink anything (not even water) for 2 hours after your test, or until your numbing medicine wears off. Do not use cigarettes. Do not use e-cigarettes. Get help right away if you have chest pain.  Please call our office for any questions or concerns.  [phone]. Okay to restart your aspirin and your Pletal on 05/15/23  This information is not intended to replace advice given to you by your health care provider. Make sure you discuss any questions you have with your health care provider. Document Released: 11/19/2008 Document Revised: 01/04/2017 Document  Reviewed: 02/10/2016 Elsevier Patient Education  2020 [employer].

## 2023-05-14 NOTE — Anesthesia Preprocedure Evaluation (Signed)
 Anesthesia Evaluation  Patient identified by MRN, date of birth, ID band Patient awake    Reviewed: Allergy & Precautions, NPO status , Patient's Chart, lab work & pertinent test results  Airway Mallampati: II  TM Distance: >3 FB Neck ROM: Full    Dental   Pulmonary shortness of breath, asthma , sleep apnea , former smoker   breath sounds clear to auscultation       Cardiovascular hypertension, Pt. on medications + CAD   Rhythm:Regular Rate:Normal     Neuro/Psych TIACVA    GI/Hepatic Neg liver ROS,GERD  ,,  Endo/Other  diabetesHypothyroidism    Renal/GU Renal disease     Musculoskeletal  (+) Arthritis ,    Abdominal   Peds  Hematology negative hematology ROS (+)   Anesthesia Other Findings   Reproductive/Obstetrics                             Anesthesia Physical Anesthesia Plan  ASA: 3  Anesthesia Plan: General   Post-op Pain Management: Tylenol PO (pre-op)*   Induction: Intravenous  PONV Risk Score and Plan: 3 and Dexamethasone, Ondansetron and Treatment may vary due to age or medical condition  Airway Management Planned: Oral ETT  Additional Equipment: None  Intra-op Plan:   Post-operative Plan: Extubation in OR  Informed Consent: I have reviewed the patients History and Physical, chart, labs and discussed the procedure including the risks, benefits and alternatives for the proposed anesthesia with the patient or authorized representative who has indicated his/her understanding and acceptance.     Dental advisory given  Plan Discussed with: CRNA  Anesthesia Plan Comments:        Anesthesia Quick Evaluation

## 2023-05-14 NOTE — Anesthesia Procedure Notes (Signed)
 Procedure Name: Intubation Date/Time: 05/14/2023 7:35 AM  Performed by: Stanton Kidney, CRNAPre-anesthesia Checklist: Patient identified, Patient being monitored, Timeout performed, Emergency Drugs available and Suction available Patient Re-evaluated:Patient Re-evaluated prior to induction Oxygen Delivery Method: Circle system utilized Preoxygenation: Pre-oxygenation with 100% oxygen Induction Type: IV induction Ventilation: Mask ventilation without difficulty Laryngoscope Size: 3 and McGrath Grade View: Grade I Tube type: Oral Tube size: 8.5 mm Number of attempts: 1 Airway Equipment and Method: Stylet Placement Confirmation: ETT inserted through vocal cords under direct vision, positive ETCO2 and breath sounds checked- equal and bilateral Secured at: 22 cm Tube secured with: Tape Dental Injury: Teeth and Oropharynx as per pre-operative assessment  Comments: Pt. Refused to remove upper and lower dentures and accepted all responsibility for any damages.

## 2023-05-15 ENCOUNTER — Encounter (HOSPITAL_COMMUNITY): Payer: Self-pay | Admitting: Emergency Medicine

## 2023-05-15 ENCOUNTER — Ambulatory Visit (HOSPITAL_BASED_OUTPATIENT_CLINIC_OR_DEPARTMENT_OTHER)
Admission: RE | Admit: 2023-05-15 | Discharge: 2023-05-15 | Disposition: A | Discharge status: Home / Self Care | Source: Ambulatory Visit | Attending: Urology | Admitting: Urology

## 2023-05-15 DIAGNOSIS — N132 Hydronephrosis with renal and ureteral calculous obstruction: Secondary | ICD-10-CM | POA: Diagnosis not present

## 2023-05-15 DIAGNOSIS — N200 Calculus of kidney: Secondary | ICD-10-CM | POA: Diagnosis not present

## 2023-05-15 LAB — CYTOLOGY - NON PAP

## 2023-05-16 LAB — CYTOLOGY - NON PAP

## 2023-05-16 NOTE — Anesthesia Postprocedure Evaluation (Signed)
 Anesthesia Post Note  Patient: [name]  Procedure(s) Performed: VIDEO BRONCHOSCOPY WITH ENDOBRONCHIAL NAVIGATION (Right) BRONCHOSCOPY, WITH EBUS BRONCHOSCOPY, WITH BIOPSY BRONCHOSCOPY, WITH BRUSH BIOPSY BRONCHOSCOPY, WITH NEEDLE ASPIRATION BIOPSY     Patient location during evaluation: PACU Anesthesia Type: General Level of consciousness: awake and alert Pain management: pain level controlled Vital Signs Assessment: post-procedure vital signs reviewed and stable Respiratory status: spontaneous breathing, nonlabored ventilation, respiratory function stable and patient connected to nasal cannula oxygen Cardiovascular status: blood pressure returned to baseline and stable Postop Assessment: no apparent nausea or vomiting Anesthetic complications: no   No notable events documented.  Last Vitals:  Vitals:   05/14/23 0915 05/14/23 0930  BP: 102/60 104/61  Pulse: 62 61  Resp: 20 17  Temp:  36.7 C  SpO2: 93% 95%    Last Pain:  Vitals:   05/14/23 0915  TempSrc:   PainSc: 0-No pain                 Kennieth Rad

## 2023-05-17 ENCOUNTER — Ambulatory Visit: Admitting: Acute Care

## 2023-05-17 ENCOUNTER — Encounter: Payer: Self-pay | Admitting: Acute Care

## 2023-05-17 VITALS — BP 114/75 | HR 81 | Ht 65.0 in | Wt 215.8 lb

## 2023-05-17 DIAGNOSIS — J069 Acute upper respiratory infection, unspecified: Secondary | ICD-10-CM | POA: Diagnosis not present

## 2023-05-17 DIAGNOSIS — C3490 Malignant neoplasm of unspecified part of unspecified bronchus or lung: Secondary | ICD-10-CM

## 2023-05-17 DIAGNOSIS — Z87891 Personal history of nicotine dependence: Secondary | ICD-10-CM

## 2023-05-17 DIAGNOSIS — Z9889 Other specified postprocedural states: Secondary | ICD-10-CM

## 2023-05-17 DIAGNOSIS — R051 Acute cough: Secondary | ICD-10-CM | POA: Diagnosis not present

## 2023-05-17 MED ORDER — AZITHROMYCIN 250 MG PO TABS: ORAL_TABLET | ORAL | 0 refills | Status: DC

## 2023-05-17 MED ORDER — HYDROCODONE BIT-HOMATROP MBR 5-1.5 MG/5ML PO SOLN: 5.0000 mL | Freq: Four times a day (QID) | ORAL | 0 refills | Status: DC | PRN

## 2023-05-17 NOTE — Progress Notes (Signed)
 History of Present Illness [name] is a 68 y.o. female former smoker. ( Quit 2003 with a 14.5 pack year smoking history)  PMH significant for CAD, HTN, mediastinal adenopathy, ILD, pulmonary nodules, CAP, dibaetes, hypothyroidism, hyperlipidemia. She was seen by Dr. Delton Coombes for an abnormal pulmonary nodule found on surveillance CT Chest for ILD.  Synopsis Pt. with history of ILD and questionable sarcoid nodules had an HRCT 04/05/2023 for surveillance of these nodules. CT showed enlarging apical right upper lobe nodule, worrisome for primary bronchogenic carcinoma. Family history includes a brother who died from lung cancer.  Pt. Was seen by Buelah Manis NP, 03/22/2023 , who notified Dr. Delton Coombes. Pt. Was scheduled for bronchoscopy with biopsies 05/14/2023. She is here today to review her results as to determine best steps in plan of care moving forward.   05/17/2023 Pt. Presents for follow up after bronchoscopies with biopsy. She states she has had a  cough with blood tinged secretions, old looking blood.  Productive, especially at night.She is only 3 days post procedure, but her cough is forceful and may be impacting tissue healing. I will prescribe a cough Supressant, and give patient other options to prevent cough. She was able to cough up secretions today, and they are slightly discolored. I will treat with a z pack prophylactically   to ensure she is not developing an upper respiratory infection.She denies any fever, or worsening dyspnea.   We have discussed the results of her biopsy.  Biopsy of the right upper lobe was positive for a squamous cell lung cancer.  The 4R lymph node, 7 lymph node, and 11R lymph node were negative for malignant cells. Patient has not yet had PET scan for staging.  Additionally she has not had MRI of the brain.  I have placed orders for both of these studies.  Patient has pulmonary function test scheduled for the end of the month.  She has follow-up with Dr. Delton Coombes the  same day to review the results.  If she is a surgical candidate based on PET scan results and PFTs she will be referred to thoracic surgery for treatment.  I have referred the patient to radiation oncology for consultation in the event that surgical treatment is not an option.  Patient verbalized understanding and is in agreement with the above.  She knows to call the office or seek emergency care if she has increased hemoptysis that does not resolve.  She is here today with her daughter.     Test Results: Cytology 05/14/2023 A. LUNG, RUL, FINE NEEDLE ASPIRATION:  - Squamous cell carcinoma, see comment   B. LUNG, RUL, BRUSHING:  - Atypical cells present   C. LYMPH NODE, 4R, FINE NEEDLE ASPIRATION:  - No malignant cells identified  - Lymphoid tissue present   D. LYMPH NODE, 7, FINE NEEDLE ASPIRATION:  - No malignant cells identified  - Lymphoid tissue present   E. LYMPH NODE, 11R, FINE NEEDLE ASPIRATION:  - No malignant cells identified  - Lymphoid tissue present   04/05/2023 HRCT Chest  Centrilobular and paraseptal emphysema. Apical segment right upper lobe nodule has enlarged, now measuring 1.5 x 2.1 cm (6/38), previously 8 x 10 mm. Peripheral 8 mm left lower lobe nodule (7 x 9 mm, 6/75), stable. Nodules along the superior aspect of the left major fissure have enlarged slightly, with index nodule measuring 6 mm (6/42), previously 4 mm on 04/27/2022. Patchy coarsened ground-glass with traction bronchiectasis/bronchiolectasis and subpleural reticulation, without a definite zonal predominance.  Findings appear progressive from 04/27/2022. No pleural fluid. Minimal debris in the airway. There may be mild air trapping in the lung bases.   Upper Abdomen: Visualized portions of the liver, adrenal glands, kidneys, spleen, pancreas, stomach and bowel are grossly unremarkable. No upper abdominal adenopathy.   Musculoskeletal: Degenerative changes in the spine.   IMPRESSION: 1.  Pulmonary parenchymal pattern of fibrotic interstitial lung disease, as detailed above, progressive from 04/27/2022. Suspect mild air trapping. Findings may be due to sarcoid or fibrotic hypersensitivity pneumonitis.  2. Enlarging apical right upper lobe nodule, worrisome for primary bronchogenic carcinoma.  3. Slight enlargement of nodules along the left major fissure, possibly reactive in etiology. Recommend attention on follow-up. 4. Aortic atherosclerosis (ICD10-I70.0). Coronary artery calcification. 5. Enlarged pulmonic trunk, indicative of pulmonary arterial hypertension. 6.  Emphysema (ICD10-J43.9).    Latest Ref Rng & Units 04/18/2023    8:29 AM 04/01/2023    5:32 AM 03/31/2023    5:44 AM  CBC  WBC 4.0 - 10.5 K/uL 4.7  8.4  9.9   Hemoglobin 12.0 - 15.0 g/dL 09.8  11.9  14.7   Hematocrit 36.0 - 46.0 % 45.8  41.9  40.2   Platelets 150 - 400 K/uL 257  280  265        Latest Ref Rng & Units 04/18/2023    8:29 AM 04/01/2023    5:32 AM 03/31/2023    5:44 AM  BMP  Glucose 70 - 99 mg/dL 829  562  130   BUN 8 - 23 mg/dL 9  17  15    Creatinine 0.44 - 1.00 mg/dL 8.65  7.84  6.96   Sodium 135 - 145 mmol/L 137  135  135   Potassium 3.5 - 5.1 mmol/L 3.8  4.1  3.9   Chloride 98 - 111 mmol/L 105  104  102   CO2 22 - 32 mmol/L 24  24  19    Calcium 8.9 - 10.3 mg/dL 9.6  9.0  9.3     BNP No results found for: "BNP"  ProBNP No results found for: "PROBNP"  PFT    Component Value Date/Time   FEV1PRE 1.77 11/04/2019 1453   FEV1POST 1.84 11/04/2019 1453   FVCPRE 2.28 11/04/2019 1453   FVCPOST 2.24 11/04/2019 1453   TLC 3.55 11/04/2019 1453   DLCOUNC 16.11 11/04/2019 1453   PREFEV1FVCRT 78 11/04/2019 1453   PSTFEV1FVCRT 82 11/04/2019 1453    Ultrasound renal complete Result Date: 05/15/2023 CLINICAL DATA:  Left nephrolithiasis and hydronephrosis EXAM: RENAL / URINARY TRACT ULTRASOUND COMPLETE COMPARISON:  Renal stone protocol CT 03/15/2023 FINDINGS: Right Kidney: Renal  measurements: 11.6 x 3.3 x 4.3 cm = volume: 86 mL. Echogenicity within normal limits. No mass or hydronephrosis visualized. Left Kidney: Renal measurements: 10.4 x 4.4 x 4.5 cm = volume: 105 mL. Echogenicity within normal limits. Mild persistent hydronephrosis appears improved since prior examination. Bladder: Grossly unremarkable.  Evaluation limited due to under distension. Other: None. IMPRESSION: Mild residual left hydronephrosis appears improved since prior examination. Electronically Signed   By: Acquanetta Belling M.D.   On: 05/15/2023 12:25   DG Chest Port 1 View Result Date: 05/14/2023 CLINICAL DATA:  68 year old female status post bronchoscopy and bronchoscopic biopsy. EXAM: PORTABLE CHEST 1 VIEW COMPARISON:  High-resolution chest CT 04/05/2023 and earlier. FINDINGS: Portable AP semi upright view at 0905 hours. Lower lung volumes compared to December radiographs. Coarse and confluent bilateral lung opacity similar to the February CT scout view. No pneumothorax, pleural effusion or  consolidation. Stable mediastinal contours allowing for lower lung volumes. Visualized tracheal air column is within normal limits. No acute osseous abnormality identified.  Paucity of bowel gas. IMPRESSION: 1. No pneumothorax or other acute cardiopulmonary abnormality identified following bronchoscopic biopsy. 2. Interstitial lung disease. Electronically Signed   By: Odessa Fleming M.D.   On: 05/14/2023 10:46   DG C-ARM BRONCHOSCOPY Result Date: 05/14/2023 C-ARM BRONCHOSCOPY: Fluoroscopy was utilized by the requesting physician.  No radiographic interpretation.     Past medical hx Past Medical History:  Diagnosis Date   Acid reflux    Anxiety    ARF (acute renal failure) (HCC) 12/14/2011   Arthritis    Asthma    CAD (coronary artery disease)    a. Cath 2015 - moderate LAD, diagonal, and OM disease with 100% dRCA with L-R collaterals. b. 2019 nuc normal.   Carotid artery occlusion    Chronic pain following surgery or  procedure 2008   Abdominal pain   Common migraine with intractable migraine 06/04/2019   Dehydration 02/09/2012   Diabetes mellitus    Type II   Diabetic hyperosmolar non-ketotic state (HCC) 04/09/2014   Dyspnea    Headache(784.0)    Hepatic steatosis    noted on CT 04/2019   History of kidney stones    Hyperlipidemia    Hypertension    stress test- scheduled for 12/30/2012   Hypothyroidism    PAD (peripheral artery disease) (HCC)    a. presumed by noninvasive testing 2021.   Pneumonia    Prolonged QT interval    Pulmonary nodules/lesions, multiple 06/05/2019   SBO (small bowel obstruction) (HCC) 02/08/2012   Sleep apnea    Uses a Cpap   Stroke (HCC) 11/2012   tingling in L arm , slurred speech- came to ER     Social History   Tobacco Use   Smoking status: Former    Current packs/day: 0.00    Average packs/day: 0.3 packs/day for 29.0 years (7.3 ttl pk-yrs)    Types: Cigarettes    Start date: 02/06/1972    Quit date: 02/05/2001    Years since quitting: 22.2   Smokeless tobacco: Never  Vaping Use   Vaping status: Never Used  Substance Use Topics   Alcohol use: Not Currently   Drug use: No    [name] reports that she quit smoking about 22 years ago. Her smoking use included cigarettes. She started smoking about 51 years ago. She has a 7.3 pack-year smoking history. She has never used smokeless tobacco. She reports that she does not currently use alcohol. She reports that she does not use drugs.  Tobacco Cessation: Counseling given: Not Answered Former smoker quit 20 years ago  Past surgical hx, Family hx, Social hx all reviewed.  Current Outpatient Medications on File Prior to Visit  Medication Sig   ACCU-CHEK GUIDE test strip    Accu-Chek Softclix Lancets lancets    acetaminophen-codeine (TYLENOL #3) 300-30 MG tablet Take 1 tablet by mouth every 6 (six) hours as needed for moderate pain (pain score 4-6).   albuterol (PROVENTIL) (2.5 MG/3ML) 0.083% nebulizer  solution Take 3 mLs (2.5 mg total) by nebulization every 6 (six) hours as needed for wheezing or shortness of breath.   amLODipine (NORVASC) 10 MG tablet Take 10 mg by mouth in the morning.   aspirin EC 81 MG tablet Take 1 tablet (81 mg total) by mouth daily. Swallow whole.   atenolol (TENORMIN) 100 MG tablet Take 1 tablet (100 mg total) by  mouth daily.   B-D UF III MINI PEN NEEDLES 31G X 5 MM MISC    Blood Glucose Monitoring Suppl (ACCU-CHEK GUIDE) w/Device KIT    Cholecalciferol (VITAMIN D3) 125 MCG (5000 UT) TABS Take 5,000 Units by mouth in the morning.   cilostazol (PLETAL) 100 MG tablet Take 1 tablet (100 mg total) by mouth 2 (two) times daily.   dicyclomine (BENTYL) 10 MG capsule Take 10 mg by mouth 3 (three) times daily before meals.   estradiol (ESTRACE) 0.1 MG/GM vaginal cream Apply 3 times weekly by using a pea-sized amount applied to fingertip   Evolocumab (REPATHA SURECLICK) 140 MG/ML SOAJ INJECT 1 PEN INTO THE SKIN EVERY 14 (FOURTEEN) DAYS.   insulin aspart protamine - aspart (NOVOLOG 70/30 MIX) (70-30) 100 UNIT/ML FlexPen Inject 40 Units into the skin in the morning and at bedtime.   levothyroxine (SYNTHROID) 100 MCG tablet Take 100 mcg by mouth daily before breakfast.   methenamine (HIPREX) 1 g tablet Take 1 tablet (1 g total) by mouth 2 (two) times daily with a meal. Take along with a vit C tablet as directed   MOUNJARO 12.5 MG/0.5ML Pen    MOUNJARO 15 MG/0.5ML Pen Inject 15 mg into the skin once a week.   Nutritional Supplements (FRUIT & VEGETABLE DAILY PO) Take 6 capsules by mouth daily with lunch. Balance of Nature Fruit and Vegetable Supplements (3 Fruit + 3 Vegetable)   ondansetron (ZOFRAN) 4 MG tablet Take 1 tablet (4 mg total) by mouth every 8 (eight) hours as needed for nausea or vomiting.   oxybutynin (DITROPAN) 5 MG tablet Take 1 tablet (5 mg total) by mouth every 8 (eight) hours as needed for bladder spasms.   pantoprazole (PROTONIX) 40 MG tablet Take 1 tablet (40 mg  total) by mouth daily as needed (indigestion/heartburn).   PROAIR HFA 108 (90 Base) MCG/ACT inhaler Inhale 2-3 puffs into the lungs every 6 (six) hours as needed for shortness of breath.   spironolactone (ALDACTONE) 25 MG tablet Take 1 tablet (25 mg total) by mouth daily.   zolpidem (AMBIEN CR) 12.5 MG CR tablet Take 12.5 mg by mouth at bedtime as needed for sleep.   rosuvastatin (CRESTOR) 40 MG tablet Take 1 tablet (40 mg total) by mouth daily.   No current facility-administered medications on file prior to visit.     Allergies  Allergen Reactions   Strawberry Extract Anaphylaxis   Cefdinir Other (See Comments)   Oxycodone Hcl Nausea And Vomiting   Sulfamethoxazole-Trimethoprim Rash   Topiramate Other (See Comments)    GI Upset, Dizzy, HA, Blurred Vision   Cymbalta [Duloxetine Hcl] Other (See Comments)    Dizzy    Lisinopril Itching, Swelling and Other (See Comments)    Angioedema    Lunesta [Eszopiclone] Other (See Comments)    Bad taste in mouth    Metformin And Related Other (See Comments)    Damaged patient's kidneys   Other Other (See Comments)    Pt is a Jehovah Witness. No blood products.   Sulfa Antibiotics Itching, Swelling and Other (See Comments)    Angioedema    Tramadol Other (See Comments)    Hallucinations    Trazodone And Nefazodone Other (See Comments)    Shakes    Penicillins Rash and Other (See Comments)    "Paralyzed" (per patient) Has patient had a PCN reaction causing immediate rash, facial/tongue/throat swelling, SOB or lightheadedness with hypotension: Yes Has patient had a PCN reaction causing severe rash involving mucus membranes  or skin necrosis: No Has patient had a PCN reaction that required hospitalization Yes Has patient had a PCN reaction occurring within the last 10 years: No If all of the above answers are "NO", then may proceed with Cephalosporin use.     Review Of Systems:  Constitutional:   No  weight loss, night sweats,  Fevers,  chills, fatigue, or  lassitude.  HEENT:   No headaches,  Difficulty swallowing,  Tooth/dental problems, or  Sore throat,                No sneezing, itching, ear ache, nasal congestion, post nasal drip,   CV:  No chest pain,  Orthopnea, PND, swelling in lower extremities, anasarca, dizziness, palpitations, syncope.   GI  No heartburn, indigestion, abdominal pain, nausea, vomiting, diarrhea, change in bowel habits, loss of appetite, bloody stools.   Resp: No shortness of breath with exertion or at rest.  + excess mucus, + productive cough,  + non-productive cough,  + coughing up of blood.  + change in color of mucus.  No wheezing.  No chest wall deformity  Skin: no rash or lesions.  GU: no dysuria, change in color of urine, no urgency or frequency.  No flank pain, no hematuria   MS:  No joint pain or swelling.  No decreased range of motion.  No back pain.  Psych:  No change in mood or affect. No depression or anxiety.  No memory loss.   Vital Signs BP 114/75 (BP Location: Left Arm, Patient Position: Sitting, Cuff Size: Large)   Pulse 81   Ht 5\' 5"  (1.651 m)   Wt 215 lb 12.8 oz (97.9 kg)   SpO2 96%   BMI 35.91 kg/m    Physical Exam:  General- No distress,  A&Ox3, pleasant and appropriate ENT: No sinus tenderness, TM clear, pale nasal mucosa, no oral exudate,no post nasal drip, no LAN Cardiac: S1, S2, regular rate and rhythm, no murmur Chest: No wheeze/ rales/ dullness; no accessory muscle use, no nasal flaring, no sternal retractions, slightly diminished per bases, few scattered rhonchi that clear with cough Abd.: Soft Non-tender, nondistended, bowel sounds positive,Body mass index is 35.91 kg/m.  Ext: No clubbing cyanosis, edema, no obvious deformities Neuro:  normal strength, moving all extremities x 4, alert and oriented x 3 Skin: No rashes, warm and dry.  No obvious lesions Psych: normal mood and behavior   Assessment/Plan New diagnosis adenocarcinoma of the lung, right  upper lobe Former smoker ILD Post bronchoscopy with biopsies Continued light dark-colored hemoptysis Cough with discolored secretions, no fever Plan I have sent her prescription for Z-Pak for your cough and discolored secretions. Take 2 tablets today then take 1 tablet daily until gone. I have also written a prescription for Hycodan cough syrup. Take 5 ml as needed for cough, no more than every 6 hours. Do not drive when taking this medication. Sips of water instead of throat clearing for cough Sugar Free Coca-Cola or Werther's originals for throat soothing. Your biopsy was positive for a squamous cell lung cancer. This is a non-small cell lung cancer. I have ordered a PET scan to complete staging of the cancer. You will get a call to get this scheduled Additionally I will order either an MRI brain or a CT brain based on whether the titanium rod you have in your leg is MRI compatible. You will get a call to get 1 of these 2 studies scheduled. I have made a referral to radiation  oncology to be evaluated for treatment. Once we have the PET scan results we can determine if you are surgical candidate to have the lung nodule removed. You have pulmonary function test scheduled for April 30, and then you will meet with Dr. Delton Coombes to review the results This will help to determine if you are surgical candidate if that is the option you prefer for treatment. Call to be seen or seek emergency care if the blood in your mucus becomes larger in amount or becomes more bright red Call if you need [place] sooner.. Please contact office for sooner follow up if symptoms do not improve or worsen or seek emergency care   I spent 33 minutes dedicated to the care of this patient on the date of this encounter to include pre-visit review of records, face-to-face time with the patient discussing conditions above, post visit ordering of testing, clinical documentation with the electronic health record, making  appropriate referrals as documented, and communicating necessary information to the patient's healthcare team.   Bevelyn Ngo, NP 05/17/2023  11:09 AM

## 2023-05-17 NOTE — Patient Instructions (Addendum)
 It is good to see you today.  I have sent her prescription for Z-Pak for your cough and discolored secretions. Take 2 tablets today then take 1 tablet daily until gone. I have also written a prescription for Hycodan cough syrup. Take 5 ml as needed for cough, no more than every 6 hours. Do not drive when taking this medication. Sips of water instead of throat clearing for cough Sugar Free Coca-Cola or Werther's originals for throat soothing. Your biopsy was positive for a squamous cell lung cancer. This is a non-small cell lung cancer. I have ordered a PET scan to complete staging of the cancer. You will get a call to get this scheduled Additionally I will order either an MRI brain or a CT brain based on whether the titanium rod you have in your leg is MRI compatible. You will get a call to get 1 of these 2 studies scheduled. I have made a referral to radiation oncology to be evaluated for treatment. Once we have the PET scan results we can determine if you are surgical candidate to have the lung nodule removed. You have pulmonary function test scheduled for April 30, and then you will meet with Dr. Delton Coombes to review the results This will help to determine if you are surgical candidate if that is the option you prefer for treatment. Call to be seen or seek emergency care if the blood in your mucus becomes larger in amount or becomes more bright red Call if you need [place] sooner.. Please contact office for sooner follow up if symptoms do not improve or worsen or seek emergency care

## 2023-05-20 ENCOUNTER — Ambulatory Visit (HOSPITAL_BASED_OUTPATIENT_CLINIC_OR_DEPARTMENT_OTHER)

## 2023-05-22 ENCOUNTER — Encounter: Payer: Self-pay | Admitting: Urology

## 2023-05-22 ENCOUNTER — Ambulatory Visit: Admitting: Urology

## 2023-05-22 VITALS — BP 102/65 | HR 79

## 2023-05-22 DIAGNOSIS — N390 Urinary tract infection, site not specified: Secondary | ICD-10-CM | POA: Diagnosis not present

## 2023-05-22 DIAGNOSIS — N200 Calculus of kidney: Secondary | ICD-10-CM | POA: Diagnosis not present

## 2023-05-22 DIAGNOSIS — Z87440 Personal history of urinary (tract) infections: Secondary | ICD-10-CM

## 2023-05-22 LAB — MICROSCOPIC EXAMINATION

## 2023-05-22 LAB — URINALYSIS, ROUTINE W REFLEX MICROSCOPIC
Bilirubin, UA: NEGATIVE
Glucose, UA: NEGATIVE
Ketones, UA: NEGATIVE
Nitrite, UA: NEGATIVE
Specific Gravity, UA: 1.015 (ref 1.005–1.030)
Urobilinogen, Ur: 0.2 mg/dL (ref 0.2–1.0)
pH, UA: 6 (ref 5.0–7.5)
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
## 2023-05-22 NOTE — Addendum Note (Signed)
 Addended by: Montgomery Apgar on: 05/22/2023 03:35 PM   Modules accepted: Orders

## 2023-05-23 ENCOUNTER — Ambulatory Visit (HOSPITAL_COMMUNITY)
Admission: RE | Admit: 2023-05-23 | Discharge: 2023-05-23 | Disposition: A | Discharge status: Home / Self Care | Source: Ambulatory Visit | Attending: Acute Care | Admitting: Acute Care

## 2023-05-23 ENCOUNTER — Telehealth (HOSPITAL_BASED_OUTPATIENT_CLINIC_OR_DEPARTMENT_OTHER): Payer: Self-pay

## 2023-05-23 DIAGNOSIS — C3490 Malignant neoplasm of unspecified part of unspecified bronchus or lung: Secondary | ICD-10-CM | POA: Diagnosis not present

## 2023-05-23 MED ORDER — GADOBUTROL 1 MMOL/ML IV SOLN
9.0000 mL | Freq: Once | INTRAVENOUS | Status: AC | PRN
  Administered 2023-05-23: 9 mL via INTRAVENOUS

## 2023-05-24 NOTE — Progress Notes (Signed)
 Thoracic Location of Tumor / Histology:  Malignant Neoplasm of Unspecified part of Unspecified Lung  Patient would get pneumonia every year. This year, patient got covid and pneumonia  This time patient  Always coughing Biopsies revealed:  04/05/2023 CT Chest WO Contrast   Tobacco/Marijuana/Snuff/ETOH use:  History of former tobacco use, OSA, ANA Positive   Past/Anticipated interventions by Pulmonology if any:  Flexible Video Fiberoptic Bronchoscopy with Robotic Assistance and Biopsies.   Past/Anticipated interventions by medical oncology, if any:   Signs/Symptoms Weight changes, if any: Patient has lost weight, used to be close to 300, now is 215 Respiratory complaints, if any: Coughs up a lot of phlegm during the da. Denies wheezing or shortness of breath. Hemoptysis, if any: Patient did cough up blood after the biopsy Pain issues, if any:  Patient  SAFETY ISSUES: Prior radiation? None Pacemaker/ICD? None  Possible current pregnancy?None Is the patient on methotrexate? None  Current Complaints / other details:   None   BP 135/79 (BP Location: Left Arm, Patient Position: Sitting)   Pulse 65   Temp 97.8 F (36.6 C) (Temporal)   Resp 18   Ht 5\' 5"  (1.651 m)   Wt 215 lb 6 oz (97.7 kg)   SpO2 98%   BMI 35.84 kg/m    Wt Readings from Last 3 Encounters:  05/31/23 215 lb 6 oz (97.7 kg)  05/17/23 215 lb 12.8 oz (97.9 kg)  05/14/23 216 lb (98 kg)

## 2023-05-25 ENCOUNTER — Other Ambulatory Visit: Payer: Self-pay | Admitting: Urology

## 2023-05-25 ENCOUNTER — Encounter: Payer: Self-pay | Admitting: Urology

## 2023-05-25 LAB — URINE CULTURE

## 2023-05-25 MED ORDER — NITROFURANTOIN MONOHYD MACRO 100 MG PO CAPS: 100.0000 mg | ORAL_CAPSULE | Freq: Two times a day (BID) | ORAL | 0 refills | Status: DC

## 2023-05-27 ENCOUNTER — Ambulatory Visit: Admitting: Acute Care

## 2023-05-28 ENCOUNTER — Encounter (HOSPITAL_COMMUNITY)
Admission: RE | Admit: 2023-05-28 | Discharge: 2023-05-28 | Disposition: A | Discharge status: Home / Self Care | Source: Ambulatory Visit | Attending: Acute Care | Admitting: Acute Care

## 2023-05-28 DIAGNOSIS — C3490 Malignant neoplasm of unspecified part of unspecified bronchus or lung: Secondary | ICD-10-CM | POA: Insufficient documentation

## 2023-05-28 DIAGNOSIS — C3432 Malignant neoplasm of lower lobe, left bronchus or lung: Secondary | ICD-10-CM | POA: Diagnosis not present

## 2023-05-28 LAB — GLUCOSE, CAPILLARY: Glucose-Capillary: 164 mg/dL — ABNORMAL HIGH (ref 70–99)

## 2023-05-28 MED ORDER — FLUDEOXYGLUCOSE F - 18 (FDG) INJECTION
10.4960 | Freq: Once | INTRAVENOUS | Status: AC
Start: 2023-05-28 — End: 2023-05-28
  Administered 2023-05-28: 10.496 via INTRAVENOUS
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
## 2023-05-31 ENCOUNTER — Ambulatory Visit
Admission: RE | Admit: 2023-05-31 | Discharge: 2023-05-31 | Disposition: A | Discharge status: Home / Self Care | Source: Ambulatory Visit | Attending: Radiation Oncology | Admitting: Radiation Oncology

## 2023-05-31 ENCOUNTER — Encounter: Payer: Self-pay | Admitting: Radiation Oncology

## 2023-05-31 VITALS — BP 135/79 | HR 65 | Temp 97.8°F | Resp 18 | Ht 65.0 in | Wt 215.4 lb

## 2023-05-31 DIAGNOSIS — Z87442 Personal history of urinary calculi: Secondary | ICD-10-CM | POA: Diagnosis not present

## 2023-05-31 DIAGNOSIS — E785 Hyperlipidemia, unspecified: Secondary | ICD-10-CM | POA: Insufficient documentation

## 2023-05-31 DIAGNOSIS — Z79899 Other long term (current) drug therapy: Secondary | ICD-10-CM | POA: Diagnosis not present

## 2023-05-31 DIAGNOSIS — K760 Fatty (change of) liver, not elsewhere classified: Secondary | ICD-10-CM | POA: Insufficient documentation

## 2023-05-31 DIAGNOSIS — N1330 Unspecified hydronephrosis: Secondary | ICD-10-CM | POA: Diagnosis not present

## 2023-05-31 DIAGNOSIS — Z87891 Personal history of nicotine dependence: Secondary | ICD-10-CM | POA: Insufficient documentation

## 2023-05-31 DIAGNOSIS — Z7985 Long-term (current) use of injectable non-insulin antidiabetic drugs: Secondary | ICD-10-CM | POA: Diagnosis not present

## 2023-05-31 DIAGNOSIS — I119 Hypertensive heart disease without heart failure: Secondary | ICD-10-CM | POA: Diagnosis not present

## 2023-05-31 DIAGNOSIS — Z7902 Long term (current) use of antithrombotics/antiplatelets: Secondary | ICD-10-CM | POA: Diagnosis not present

## 2023-05-31 DIAGNOSIS — Z794 Long term (current) use of insulin: Secondary | ICD-10-CM | POA: Diagnosis not present

## 2023-05-31 DIAGNOSIS — R222 Localized swelling, mass and lump, trunk: Secondary | ICD-10-CM | POA: Insufficient documentation

## 2023-05-31 DIAGNOSIS — C3411 Malignant neoplasm of upper lobe, right bronchus or lung: Secondary | ICD-10-CM | POA: Diagnosis not present

## 2023-05-31 DIAGNOSIS — E039 Hypothyroidism, unspecified: Secondary | ICD-10-CM | POA: Diagnosis not present

## 2023-05-31 DIAGNOSIS — G4730 Sleep apnea, unspecified: Secondary | ICD-10-CM | POA: Insufficient documentation

## 2023-05-31 DIAGNOSIS — Z7982 Long term (current) use of aspirin: Secondary | ICD-10-CM | POA: Insufficient documentation

## 2023-05-31 DIAGNOSIS — I2510 Atherosclerotic heart disease of native coronary artery without angina pectoris: Secondary | ICD-10-CM | POA: Diagnosis not present

## 2023-05-31 DIAGNOSIS — E1151 Type 2 diabetes mellitus with diabetic peripheral angiopathy without gangrene: Secondary | ICD-10-CM | POA: Diagnosis not present

## 2023-05-31 DIAGNOSIS — Z8673 Personal history of transient ischemic attack (TIA), and cerebral infarction without residual deficits: Secondary | ICD-10-CM | POA: Diagnosis not present

## 2023-05-31 DIAGNOSIS — J849 Interstitial pulmonary disease, unspecified: Secondary | ICD-10-CM | POA: Insufficient documentation

## 2023-05-31 HISTORY — DX: Malignant neoplasm of unspecified part of unspecified bronchus or lung: C34.90

## 2023-06-05 ENCOUNTER — Encounter: Payer: Self-pay | Admitting: Emergency Medicine

## 2023-06-05 ENCOUNTER — Ambulatory Visit (INDEPENDENT_AMBULATORY_CARE_PROVIDER_SITE_OTHER): Payer: Medicare HMO | Admitting: Emergency Medicine

## 2023-06-05 ENCOUNTER — Ambulatory Visit: Payer: Medicare HMO | Admitting: Emergency Medicine

## 2023-06-05 VITALS — BP 105/69 | HR 74 | Ht 65.0 in | Wt 215.4 lb

## 2023-06-05 DIAGNOSIS — C3411 Malignant neoplasm of upper lobe, right bronchus or lung: Secondary | ICD-10-CM | POA: Diagnosis not present

## 2023-06-05 DIAGNOSIS — J449 Chronic obstructive pulmonary disease, unspecified: Secondary | ICD-10-CM | POA: Diagnosis not present

## 2023-06-05 DIAGNOSIS — J849 Interstitial pulmonary disease, unspecified: Secondary | ICD-10-CM | POA: Diagnosis not present

## 2023-06-05 DIAGNOSIS — J441 Chronic obstructive pulmonary disease with (acute) exacerbation: Secondary | ICD-10-CM | POA: Insufficient documentation

## 2023-06-05 DIAGNOSIS — J438 Other emphysema: Secondary | ICD-10-CM

## 2023-06-05 LAB — PULMONARY FUNCTION TEST
DL/VA % pred: 100 %
DL/VA: 4.19 ml/min/mmHg/L
DLCO cor % pred: 59 %
DLCO cor: 11.77 ml/min/mmHg
DLCO unc % pred: 59 %
DLCO unc: 11.77 ml/min/mmHg
FEF 25-75 Post: 1.4 L/s
FEF 25-75 Pre: 1.49 L/s
FEF2575-%Change-Post: -5 %
FEF2575-%Pred-Post: 70 %
FEF2575-%Pred-Pre: 74 %
FEV1-%Change-Post: -1 %
FEV1-%Pred-Post: 67 %
FEV1-%Pred-Pre: 68 %
FEV1-Post: 1.58 L
FEV1-Pre: 1.6 L
FEV1FVC-%Change-Post: 4 %
FEV1FVC-%Pred-Pre: 104 %
FEV6-%Change-Post: -6 %
FEV6-%Pred-Post: 63 %
FEV6-%Pred-Pre: 68 %
FEV6-Post: 1.88 L
FEV6-Pre: 2 L
FEV6FVC-%Pred-Post: 104 %
FEV6FVC-%Pred-Pre: 104 %
FVC-%Change-Post: -6 %
FVC-%Pred-Post: 61 %
FVC-%Pred-Pre: 65 %
FVC-Post: 1.88 L
FVC-Pre: 2 L
Post FEV1/FVC ratio: 84 %
Post FEV6/FVC ratio: 100 %
Pre FEV1/FVC ratio: 80 %
Pre FEV6/FVC Ratio: 100 %
RV % pred: 42 %
RV: 0.92 L
TLC % pred: 59 %
TLC: 3 L

## 2023-06-05 MED ORDER — SPIRIVA RESPIMAT 2.5 MCG/ACT IN AERS: 2.0000 | INHALATION_SPRAY | Freq: Every day | RESPIRATORY_TRACT | 11 refills | Status: DC

## 2023-06-05 MED ORDER — ALBUTEROL SULFATE HFA 108 (90 BASE) MCG/ACT IN AERS: 2.0000 | INHALATION_SPRAY | Freq: Four times a day (QID) | RESPIRATORY_TRACT | 6 refills | Status: DC | PRN

## 2023-06-05 NOTE — Progress Notes (Signed)
   Subjective:    Patient ID: [name], female    DOB: 1955-05-24, 68 y.o.   MRN: [id number]  HPI   ROV 06/05/2023 --[name] is 75 with a history of former tobacco use, OSA, positive ANA.  Suspected sarcoidosis based on EBUS and navigational bronchoscopy 05/2021 for mixed groundglass infiltrates and mediastinal adenopathy.  There was no evidence of granulomatous disease on that study.  Since then serial imaging showed a new right upper lobe nodule as well as mediastinal adenopathy.  She underwent repeat bronchoscopy on 05/14/2023.  Right upper lobe nodule biopsies showed squamous cell carcinoma.  The mediastinal adenopathy was negative for any malignancy or granulomas.  She has been referred to radiation oncology.  MRI brain did not show any evidence of metastatic disease. She contemplated surgical resection, but is leaning now towards SBRT.   PET scan done 05/28/2023 reviewed by me shows that the right upper lobe nodule is hypermetabolic.  There is some patchy parenchymal groundglass opacity bilaterally, scattered nodules that do not show any significant hypermetabolism.  No evidence of distant disease.   Review of Systems As per HPI     Objective:   Physical Exam Vitals:   06/05/23 1059  BP: 105/69  Pulse: 74  SpO2: 94%    Gen: Pleasant, obese woman, in no distress,  normal affect, frequent cough  ENT: No lesions,  mouth clear,  oropharynx clear, no postnasal drip  Neck: No JVD, no stridor  Lungs: No use of accessory muscles, no crackles or wheezing on normal respiration, no wheeze on forced expiration  Cardiovascular: RRR, heart sounds normal, no murmur or gallops, no peripheral edema  Musculoskeletal: No deformities, no cyanosis or clubbing  Neuro: alert, awake, non focal  Skin: Warm, no lesions or rash      Assessment & Plan:  Cancer of upper lobe of right lung (HCC) We reviewed her pulmonary function testing today.  Based on this and her functional capacity have  recommended that she proceed with SBRT with Dr. Lurena Sally.  She had initially wanted to consider surgical resection but I think she is marginal for this.  She understands and agrees.  COPD (chronic obstructive pulmonary disease) (HCC) Mixed obstruction and restriction on her pulmonary function testing.  She does have interstitial changes, we have questioned whether this could be due to sarcoidosis.  Also some evidence for COPD.  She is not on BD therapy.  We are going to do a trial of Spiriva, albuterol  as needed to see if she gets benefit.  Time spent 30 minutes  Racheal Buddle, MD, PhD 06/05/2023, 1:19 PM [facility] [phone] or if no answer [phone]

## 2023-06-05 NOTE — Assessment & Plan Note (Signed)
 Mixed obstruction and restriction on her pulmonary function testing.  She does have interstitial changes, we have questioned whether this could be due to sarcoidosis.  Also some evidence for COPD.  She is not on BD therapy.  We are going to do a trial of Spiriva, albuterol  as needed to see if she gets benefit.

## 2023-06-05 NOTE — Patient Instructions (Signed)
 Full PFT performed today.

## 2023-06-05 NOTE — Progress Notes (Signed)
 Full PFT performed today.

## 2023-06-05 NOTE — Assessment & Plan Note (Signed)
 We reviewed her pulmonary function testing today.  Based on this and her functional capacity have recommended that she proceed with SBRT with Dr. Lurena Sally.  She had initially wanted to consider surgical resection but I think she is marginal for this.  She understands and agrees.

## 2023-06-05 NOTE — Patient Instructions (Signed)
 We reviewed your pulmonary function testing today. Agree with following up with Dr. Lurena Sally to initiate radiation therapy.  We will hold off on referral to thoracic surgery We will try starting an inhaled medication called Spiriva.  Take 2 puffs once daily.  Take it every day on a schedule.  Keep track of how it helps your breathing so we can discuss next time. We will refill your albuterol .  You can use 2 puffs up to every 4 hours if needed for shortness of breath. Follow with Dr. Baldwin Levee in about 4 months so we can review how you are doing on the inhaler medication.

## 2023-06-09 DIAGNOSIS — E1165 Type 2 diabetes mellitus with hyperglycemia: Secondary | ICD-10-CM | POA: Diagnosis not present

## 2023-06-10 ENCOUNTER — Ambulatory Visit
Admission: RE | Admit: 2023-06-10 | Discharge: 2023-06-10 | Disposition: A | Discharge status: Home / Self Care | Source: Ambulatory Visit | Attending: Radiation Oncology | Admitting: Radiation Oncology

## 2023-06-10 DIAGNOSIS — Z510 Encounter for antineoplastic radiation therapy: Secondary | ICD-10-CM | POA: Diagnosis not present

## 2023-06-10 DIAGNOSIS — Z87891 Personal history of nicotine dependence: Secondary | ICD-10-CM | POA: Diagnosis not present

## 2023-06-10 DIAGNOSIS — C3411 Malignant neoplasm of upper lobe, right bronchus or lung: Secondary | ICD-10-CM | POA: Insufficient documentation

## 2023-06-11 DIAGNOSIS — Z87891 Personal history of nicotine dependence: Secondary | ICD-10-CM | POA: Diagnosis not present

## 2023-06-11 DIAGNOSIS — C3411 Malignant neoplasm of upper lobe, right bronchus or lung: Secondary | ICD-10-CM | POA: Diagnosis not present

## 2023-06-11 DIAGNOSIS — Z510 Encounter for antineoplastic radiation therapy: Secondary | ICD-10-CM | POA: Diagnosis not present

## 2023-06-16 ENCOUNTER — Encounter (HOSPITAL_COMMUNITY): Payer: Self-pay

## 2023-06-16 ENCOUNTER — Emergency Department (HOSPITAL_COMMUNITY)

## 2023-06-16 ENCOUNTER — Other Ambulatory Visit: Payer: Self-pay

## 2023-06-16 ENCOUNTER — Inpatient Hospital Stay (HOSPITAL_COMMUNITY)
Admission: EM | Admit: 2023-06-16 | Discharge: 2023-06-18 | DRG: 189 | Disposition: A | Discharge status: Home / Self Care | Attending: Internal Medicine | Admitting: Internal Medicine

## 2023-06-16 DIAGNOSIS — Z7985 Long-term (current) use of injectable non-insulin antidiabetic drugs: Secondary | ICD-10-CM

## 2023-06-16 DIAGNOSIS — E66812 Obesity, class 2: Secondary | ICD-10-CM | POA: Diagnosis present

## 2023-06-16 DIAGNOSIS — N3281 Overactive bladder: Secondary | ICD-10-CM | POA: Insufficient documentation

## 2023-06-16 DIAGNOSIS — Z79890 Hormone replacement therapy (postmenopausal): Secondary | ICD-10-CM | POA: Diagnosis not present

## 2023-06-16 DIAGNOSIS — Z7982 Long term (current) use of aspirin: Secondary | ICD-10-CM | POA: Diagnosis not present

## 2023-06-16 DIAGNOSIS — J441 Chronic obstructive pulmonary disease with (acute) exacerbation: Principal | ICD-10-CM | POA: Diagnosis present

## 2023-06-16 DIAGNOSIS — J439 Emphysema, unspecified: Secondary | ICD-10-CM | POA: Diagnosis not present

## 2023-06-16 DIAGNOSIS — I130 Hypertensive heart and chronic kidney disease with heart failure and stage 1 through stage 4 chronic kidney disease, or unspecified chronic kidney disease: Secondary | ICD-10-CM | POA: Diagnosis present

## 2023-06-16 DIAGNOSIS — I10 Essential (primary) hypertension: Secondary | ICD-10-CM | POA: Diagnosis not present

## 2023-06-16 DIAGNOSIS — R918 Other nonspecific abnormal finding of lung field: Secondary | ICD-10-CM | POA: Diagnosis not present

## 2023-06-16 DIAGNOSIS — K760 Fatty (change of) liver, not elsewhere classified: Secondary | ICD-10-CM | POA: Insufficient documentation

## 2023-06-16 DIAGNOSIS — Z8673 Personal history of transient ischemic attack (TIA), and cerebral infarction without residual deficits: Secondary | ICD-10-CM

## 2023-06-16 DIAGNOSIS — Z8679 Personal history of other diseases of the circulatory system: Secondary | ICD-10-CM | POA: Diagnosis not present

## 2023-06-16 DIAGNOSIS — I2510 Atherosclerotic heart disease of native coronary artery without angina pectoris: Secondary | ICD-10-CM | POA: Diagnosis not present

## 2023-06-16 DIAGNOSIS — E1151 Type 2 diabetes mellitus with diabetic peripheral angiopathy without gangrene: Secondary | ICD-10-CM | POA: Diagnosis present

## 2023-06-16 DIAGNOSIS — I739 Peripheral vascular disease, unspecified: Secondary | ICD-10-CM | POA: Diagnosis not present

## 2023-06-16 DIAGNOSIS — E1122 Type 2 diabetes mellitus with diabetic chronic kidney disease: Secondary | ICD-10-CM | POA: Diagnosis present

## 2023-06-16 DIAGNOSIS — I5032 Chronic diastolic (congestive) heart failure: Secondary | ICD-10-CM | POA: Diagnosis not present

## 2023-06-16 DIAGNOSIS — Z7902 Long term (current) use of antithrombotics/antiplatelets: Secondary | ICD-10-CM | POA: Diagnosis not present

## 2023-06-16 DIAGNOSIS — J449 Chronic obstructive pulmonary disease, unspecified: Secondary | ICD-10-CM | POA: Diagnosis present

## 2023-06-16 DIAGNOSIS — E785 Hyperlipidemia, unspecified: Secondary | ICD-10-CM | POA: Diagnosis present

## 2023-06-16 DIAGNOSIS — J9601 Acute respiratory failure with hypoxia: Secondary | ICD-10-CM | POA: Diagnosis not present

## 2023-06-16 DIAGNOSIS — Z794 Long term (current) use of insulin: Secondary | ICD-10-CM | POA: Diagnosis not present

## 2023-06-16 DIAGNOSIS — F411 Generalized anxiety disorder: Secondary | ICD-10-CM | POA: Diagnosis not present

## 2023-06-16 DIAGNOSIS — N183 Chronic kidney disease, stage 3 unspecified: Secondary | ICD-10-CM | POA: Insufficient documentation

## 2023-06-16 DIAGNOSIS — R590 Localized enlarged lymph nodes: Secondary | ICD-10-CM | POA: Diagnosis not present

## 2023-06-16 DIAGNOSIS — J849 Interstitial pulmonary disease, unspecified: Secondary | ICD-10-CM | POA: Diagnosis not present

## 2023-06-16 DIAGNOSIS — Z87891 Personal history of nicotine dependence: Secondary | ICD-10-CM

## 2023-06-16 DIAGNOSIS — Z6836 Body mass index (BMI) 36.0-36.9, adult: Secondary | ICD-10-CM

## 2023-06-16 DIAGNOSIS — E039 Hypothyroidism, unspecified: Secondary | ICD-10-CM | POA: Diagnosis not present

## 2023-06-16 DIAGNOSIS — N1832 Chronic kidney disease, stage 3b: Secondary | ICD-10-CM | POA: Diagnosis present

## 2023-06-16 DIAGNOSIS — E119 Type 2 diabetes mellitus without complications: Secondary | ICD-10-CM | POA: Diagnosis not present

## 2023-06-16 DIAGNOSIS — C3411 Malignant neoplasm of upper lobe, right bronchus or lung: Secondary | ICD-10-CM | POA: Diagnosis not present

## 2023-06-16 DIAGNOSIS — G4733 Obstructive sleep apnea (adult) (pediatric): Secondary | ICD-10-CM | POA: Insufficient documentation

## 2023-06-16 DIAGNOSIS — N1831 Chronic kidney disease, stage 3a: Secondary | ICD-10-CM

## 2023-06-16 DIAGNOSIS — R9431 Abnormal electrocardiogram [ECG] [EKG]: Secondary | ICD-10-CM | POA: Diagnosis not present

## 2023-06-16 DIAGNOSIS — R0602 Shortness of breath: Secondary | ICD-10-CM | POA: Diagnosis not present

## 2023-06-16 LAB — BASIC METABOLIC PANEL WITH GFR
Anion gap: 10 (ref 5–15)
BUN: 14 mg/dL (ref 8–23)
CO2: 22 mmol/L (ref 22–32)
Calcium: 8.9 mg/dL (ref 8.9–10.3)
Chloride: 102 mmol/L (ref 98–111)
Creatinine, Ser: 1.1 mg/dL — ABNORMAL HIGH (ref 0.44–1.00)
GFR, Estimated: 55 mL/min — ABNORMAL LOW (ref >60)
Glucose, Bld: 211 mg/dL — ABNORMAL HIGH (ref 70–99)
Potassium: 5.1 mmol/L (ref 3.5–5.1)
Sodium: 134 mmol/L — ABNORMAL LOW (ref 135–145)

## 2023-06-16 LAB — CBC
HCT: 43.3 % (ref 36.0–46.0)
Hemoglobin: 13.9 g/dL (ref 12.0–15.0)
MCH: 27 pg (ref 26.0–34.0)
MCHC: 32.1 g/dL (ref 30.0–36.0)
MCV: 84.2 fL (ref 80.0–100.0)
Platelets: 269 10*3/uL (ref 150–400)
RBC: 5.14 MIL/uL — ABNORMAL HIGH (ref 3.87–5.11)
RDW: 15.3 % (ref 11.5–15.5)
WBC: 6.6 10*3/uL (ref 4.0–10.5)
nRBC: 0 % (ref 0.0–0.2)

## 2023-06-16 MED ORDER — METHYLPREDNISOLONE SODIUM SUCC 125 MG IJ SOLR
125.0000 mg | Freq: Once | INTRAMUSCULAR | Status: AC
  Administered 2023-06-16: 125 mg via INTRAVENOUS
  Filled 2023-06-16: qty 2

## 2023-06-16 MED ORDER — INSULIN ASPART 100 UNIT/ML IJ SOLN
0.0000 [IU] | Freq: Every day | INTRAMUSCULAR | Status: DC
  Administered 2023-06-17: 4 [IU] via SUBCUTANEOUS
  Administered 2023-06-17: 3 [IU] via SUBCUTANEOUS
  Filled 2023-06-16: qty 0.05

## 2023-06-16 MED ORDER — METHYLPREDNISOLONE SODIUM SUCC 40 MG IJ SOLR
40.0000 mg | Freq: Two times a day (BID) | INTRAMUSCULAR | Status: DC
  Administered 2023-06-17 – 2023-06-18 (×3): 40 mg via INTRAVENOUS
  Filled 2023-06-16 (×3): qty 1

## 2023-06-16 MED ORDER — ALBUTEROL SULFATE (2.5 MG/3ML) 0.083% IN NEBU
INHALATION_SOLUTION | RESPIRATORY_TRACT | Status: AC
  Filled 2023-06-16: qty 3

## 2023-06-16 MED ORDER — SODIUM CHLORIDE 0.9% FLUSH
3.0000 mL | Freq: Two times a day (BID) | INTRAVENOUS | Status: DC
  Administered 2023-06-16 – 2023-06-18 (×3): 3 mL via INTRAVENOUS

## 2023-06-16 MED ORDER — AZITHROMYCIN 500 MG IV SOLR
500.0000 mg | INTRAVENOUS | Status: DC
  Administered 2023-06-18: 500 mg via INTRAVENOUS
  Filled 2023-06-16: qty 5

## 2023-06-16 MED ORDER — BENZONATATE 100 MG PO CAPS
100.0000 mg | ORAL_CAPSULE | Freq: Three times a day (TID) | ORAL | Status: DC
  Administered 2023-06-17: 100 mg via ORAL
  Filled 2023-06-16 (×2): qty 1

## 2023-06-16 MED ORDER — SPIRONOLACTONE 25 MG PO TABS
25.0000 mg | ORAL_TABLET | Freq: Every day | ORAL | Status: DC
  Administered 2023-06-17 – 2023-06-18 (×2): 25 mg via ORAL
  Filled 2023-06-16 (×2): qty 1

## 2023-06-16 MED ORDER — ACETAMINOPHEN 325 MG PO TABS
650.0000 mg | ORAL_TABLET | Freq: Four times a day (QID) | ORAL | Status: DC | PRN
  Administered 2023-06-18: 650 mg via ORAL
  Filled 2023-06-16: qty 2

## 2023-06-16 MED ORDER — IPRATROPIUM-ALBUTEROL 0.5-2.5 (3) MG/3ML IN SOLN
3.0000 mL | Freq: Once | RESPIRATORY_TRACT | Status: AC
  Administered 2023-06-16: 3 mL via RESPIRATORY_TRACT
  Filled 2023-06-16: qty 3

## 2023-06-16 MED ORDER — SODIUM CHLORIDE 0.9 % IV SOLN
500.0000 mg | Freq: Once | INTRAVENOUS | Status: AC
  Administered 2023-06-16: 500 mg via INTRAVENOUS
  Filled 2023-06-16: qty 5

## 2023-06-16 MED ORDER — SODIUM CHLORIDE 0.9% FLUSH: 3.0000 mL | INTRAVENOUS | Status: DC | PRN

## 2023-06-16 MED ORDER — INSULIN ASPART PROT & ASPART (70-30 MIX) 100 UNIT/ML PEN: 28.0000 [IU] | PEN_INJECTOR | Freq: Every day | SUBCUTANEOUS | Status: DC

## 2023-06-16 MED ORDER — SODIUM CHLORIDE 0.9% FLUSH
3.0000 mL | Freq: Two times a day (BID) | INTRAVENOUS | Status: DC
  Administered 2023-06-16 – 2023-06-17 (×2): 3 mL via INTRAVENOUS

## 2023-06-16 MED ORDER — ROSUVASTATIN CALCIUM 10 MG PO TABS
40.0000 mg | ORAL_TABLET | Freq: Every day | ORAL | Status: DC
  Administered 2023-06-17 – 2023-06-18 (×2): 40 mg via ORAL
  Filled 2023-06-16: qty 4
  Filled 2023-06-16: qty 2

## 2023-06-16 MED ORDER — IPRATROPIUM-ALBUTEROL 0.5-2.5 (3) MG/3ML IN SOLN
3.0000 mL | Freq: Four times a day (QID) | RESPIRATORY_TRACT | Status: DC
  Administered 2023-06-17 – 2023-06-18 (×6): 3 mL via RESPIRATORY_TRACT
  Filled 2023-06-16 (×7): qty 3

## 2023-06-16 MED ORDER — IOHEXOL 350 MG/ML SOLN
75.0000 mL | Freq: Once | INTRAVENOUS | Status: AC | PRN
  Administered 2023-06-16: 75 mL via INTRAVENOUS

## 2023-06-16 MED ORDER — PANTOPRAZOLE SODIUM 40 MG PO TBEC: 40.0000 mg | DELAYED_RELEASE_TABLET | Freq: Every day | ORAL | Status: DC | PRN

## 2023-06-16 MED ORDER — IPRATROPIUM-ALBUTEROL 0.5-2.5 (3) MG/3ML IN SOLN
3.0000 mL | RESPIRATORY_TRACT | Status: DC | PRN
  Administered 2023-06-17: 3 mL via RESPIRATORY_TRACT
  Filled 2023-06-16: qty 3

## 2023-06-16 MED ORDER — HEPARIN SODIUM (PORCINE) 5000 UNIT/ML IJ SOLN
5000.0000 [IU] | Freq: Three times a day (TID) | INTRAMUSCULAR | Status: DC
  Administered 2023-06-17 – 2023-06-18 (×4): 5000 [IU] via SUBCUTANEOUS
  Filled 2023-06-16 (×4): qty 1

## 2023-06-16 MED ORDER — TRIMETHOBENZAMIDE HCL 100 MG/ML IM SOLN: 200.0000 mg | Freq: Three times a day (TID) | INTRAMUSCULAR | Status: DC | PRN

## 2023-06-16 MED ORDER — INSULIN ASPART 100 UNIT/ML IJ SOLN
0.0000 [IU] | Freq: Three times a day (TID) | INTRAMUSCULAR | Status: DC
  Administered 2023-06-17: 4 [IU] via SUBCUTANEOUS
  Administered 2023-06-17: 3 [IU] via SUBCUTANEOUS
  Administered 2023-06-17: 2 [IU] via SUBCUTANEOUS
  Administered 2023-06-18 (×2): 4 [IU] via SUBCUTANEOUS
  Filled 2023-06-16: qty 0.06

## 2023-06-16 MED ORDER — ALBUTEROL SULFATE (2.5 MG/3ML) 0.083% IN NEBU
2.5000 mg | INHALATION_SOLUTION | Freq: Once | RESPIRATORY_TRACT | Status: AC
  Administered 2023-06-16: 2.5 mg via RESPIRATORY_TRACT

## 2023-06-16 MED ORDER — AMLODIPINE BESYLATE 10 MG PO TABS
10.0000 mg | ORAL_TABLET | Freq: Every morning | ORAL | Status: DC
  Administered 2023-06-17 – 2023-06-18 (×2): 10 mg via ORAL
  Filled 2023-06-16 (×2): qty 1

## 2023-06-16 MED ORDER — ACETAMINOPHEN 650 MG RE SUPP: 650.0000 mg | Freq: Four times a day (QID) | RECTAL | Status: DC | PRN

## 2023-06-16 MED ORDER — ATENOLOL 25 MG PO TABS
100.0000 mg | ORAL_TABLET | Freq: Every day | ORAL | Status: DC
  Administered 2023-06-17 – 2023-06-18 (×2): 100 mg via ORAL
  Filled 2023-06-16 (×2): qty 4

## 2023-06-16 MED ORDER — IPRATROPIUM-ALBUTEROL 0.5-2.5 (3) MG/3ML IN SOLN
10.0000 mL | Freq: Once | RESPIRATORY_TRACT | Status: AC
  Administered 2023-06-16: 10 mL via RESPIRATORY_TRACT
  Filled 2023-06-16: qty 12

## 2023-06-16 MED ORDER — SODIUM CHLORIDE 0.9 % IV SOLN: 250.0000 mL | INTRAVENOUS | Status: AC | PRN

## 2023-06-16 MED ORDER — OXYBUTYNIN CHLORIDE 5 MG PO TABS: 5.0000 mg | ORAL_TABLET | Freq: Three times a day (TID) | ORAL | Status: DC | PRN

## 2023-06-16 MED ORDER — GUAIFENESIN ER 600 MG PO TB12
600.0000 mg | ORAL_TABLET | Freq: Two times a day (BID) | ORAL | Status: DC
Start: 2023-06-16 — End: 2023-06-17
  Administered 2023-06-16 – 2023-06-17 (×2): 600 mg via ORAL
  Filled 2023-06-16 (×2): qty 1

## 2023-06-16 MED ORDER — ATENOLOL 50 MG PO TABS: 100.0000 mg | ORAL_TABLET | Freq: Every day | ORAL | Status: DC

## 2023-06-16 NOTE — ED Provider Notes (Signed)
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 (peripheral artery disease) (HCC)    a. presumed by noninvasive testing 2021.   Pneumonia    Prolonged QT interval    Pulmonary nodules/lesions, multiple 06/05/2019   SBO (small bowel obstruction) (HCC) 02/08/2012   Sleep apnea    Uses a Cpap   Stroke (HCC) 11/2012   tingling in L arm , slurred speech- came to ER      Dispostion: Admission     Final Clinical Impression(s) / ED Diagnoses Final diagnoses:  COPD exacerbation (HCC)  Acute hypoxic respiratory failure (HCC)     @PCDICTATION @    Afton Horse T, DO 06/16/23 2229

## 2023-06-16 NOTE — ED Triage Notes (Signed)
 Pt reports with wheezing and shob. Pt has been using nebs and inhaler at home with no relief.

## 2023-06-16 NOTE — Progress Notes (Signed)
   06/16/23 1843  BiPAP/CPAP/SIPAP  $ Non-Invasive Ventilator  (S)  Non-Invasive Vent Set Up;Non-Invasive Vent Initial  $ Face Mask Medium Yes  BiPAP/CPAP/SIPAP Pt Type Adult  BiPAP/CPAP/SIPAP (S)  V60  Mask Type Full face mask  Dentures removed? Not applicable  Mask Size Medium  Set Rate 12 breaths/min  Respiratory Rate 26 breaths/min  IPAP (S)  10 cmH20  EPAP (S)  5 cmH2O  Pressure Support 5 cmH20  FiO2 (%) (S)  40 %  Flow Rate  (I Time: 0.90, Rise 2.)  Minute Ventilation 15.6  Leak 0  Peak Inspiratory Pressure (PIP) 11  Tidal Volume (Vt) 611  Patient Home Machine No  Patient Home Mask No  Patient Home Tubing No  Auto Titrate No  Press High Alarm 30 cmH2O  Press Low Alarm 5 cmH2O  Nasal massage performed No (comment)  CPAP/SIPAP surface wiped down Yes  Device Plugged into RED Power Outlet Yes  BiPAP/CPAP /SiPAP Vitals  Pulse Rate 78  Resp (!) 25  SpO2 100 %  Bilateral Breath Sounds Diminished;Expiratory wheezes  MEWS Score/Color  MEWS Score 1  MEWS Score Color Green

## 2023-06-16 NOTE — ED Notes (Signed)
 Pt 83% on RA. Pt placed on 6L, O2 now 95%. MD aware.

## 2023-06-16 NOTE — H&P (Addendum)
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 Patient has new diagnosis of stage I lung cancer supposed to be starting radiation therapy from tomorrow 5/12.  Due to baseline for functional status from interstitial lung disease and COPD not a good candidate for surgery per oncology. - Please inform patient oncology's in the daytime.  History of CAD -Continue aspirin , Lipitor  and atenolol   History of PAD History of carotid artery stenosis -Continue aspirin  and Lipitor .     Essential hypertension Grade 1 diastolic heart failure with preserved EF 60 to 65% - Continue amlodipine  10 mg, spironolactone  25 mg and atenolol .  Patient reported she took 3 of the bp meds today morning and next dose will be tomorrow.  History of obstructive sleep apnea -Continue supplemental oxygen at bedtime  Hepatic steatosis -Continue Crestor .  Overactive bladder - Continue oxybutynin   History of prolonged Qtc -EKG showing normal QTc.  Currently on  IV azithromycin  for management of septic exacerbation.  Continue cardiac monitoring.  Insulin -dependent DM type II -Continue NovoLog  70/30 mix 28 unit at bedtime.  Continue sliding scale insulin  with mealtime coverage.  And POC blood glucose check  CKD stage IIIb - Renal function at baseline.   Avoid nephrotoxic agent.  Monitor urine output.  DVT prophylaxis:  SQ Heparin  Code Status:  Full Code Diet: Heart healthy carb modified diet Family Communication:   Family was present at bedside, at the time of interview. Opportunity was given to ask question and all questions were answered satisfactorily.  Disposition Plan: Continue to monitor improvement of shortness of breath and need to transition to room air eventually. Consults: None at this time Admission status:   Inpatient, progressive unit  Severity of Illness: The appropriate patient status for this patient is INPATIENT. Inpatient status is judged to be reasonable and necessary in order to provide the required intensity of service to ensure the patient's safety. The patient's presenting symptoms, physical exam findings, and initial radiographic and laboratory data in the context of their chronic comorbidities is felt to place them at high risk for further clinical deterioration. Furthermore, it is not anticipated that the patient will be medically stable for discharge from the hospital within 2 midnights of admission.   * I certify that at the point of admission it is my clinical judgment that the patient will require inpatient hospital care spanning beyond 2 midnights from the point of admission due to high intensity of service, high risk for further deterioration and high frequency of surveillance required.Aaron Aas    Siddiq Kaluzny, MD [facility]  How to contact the [facility] Attending or Consulting provider 7A - 7P or covering provider during after hours 7P -7A, for this patient.  Check the care team in [facility] and look for a)  attending/consulting TR[facility] provider listed and b) the TR[facility] team listed Log into [email or web address] and use [place]'s universal password to access. If you do not have the password, please contact the hospital operator. Locate the [facility] provider you are looking for under [facility] and page to a number that you can be directly reached. If you still have difficulty reaching the provider, please page the [facility] (Director on Call) for the Hospitalists listed on amion for assistance.  06/16/2023, 11:08 PM

## 2023-06-16 NOTE — Progress Notes (Signed)
 Patient taken off Bipap to go to CT and was placed on 5L [place].  Current sat is 96%.

## 2023-06-16 NOTE — ED Notes (Signed)
 Admitting provider Dr. Sundil at patient bedside.

## 2023-06-17 ENCOUNTER — Ambulatory Visit
Admission: RE | Admit: 2023-06-17 | Discharge: 2023-06-17 | Disposition: A | Discharge status: Home / Self Care | Source: Ambulatory Visit | Attending: Radiation Oncology | Admitting: Radiation Oncology

## 2023-06-17 ENCOUNTER — Other Ambulatory Visit: Payer: Self-pay

## 2023-06-17 ENCOUNTER — Ambulatory Visit
Admission: RE | Admit: 2023-06-17 | Discharge: 2023-06-17 | Disposition: A | Discharge status: Home / Self Care | Source: Ambulatory Visit | Attending: Radiation Oncology

## 2023-06-17 DIAGNOSIS — J441 Chronic obstructive pulmonary disease with (acute) exacerbation: Secondary | ICD-10-CM | POA: Diagnosis not present

## 2023-06-17 LAB — MRSA NEXT GEN BY PCR, NASAL: MRSA by PCR Next Gen: NOT DETECTED

## 2023-06-17 LAB — RAD ONC ARIA SESSION SUMMARY
Course Elapsed Days: 0
Plan Fractions Treated to Date: 1
Plan Prescribed Dose Per Fraction: 12 Gy
Plan Total Fractions Prescribed: 5
Plan Total Prescribed Dose: 60 Gy
Reference Point Dosage Given to Date: 12 Gy
Reference Point Session Dosage Given: 12 Gy
Session Number: 1

## 2023-06-17 LAB — CBC
HCT: 44.3 % (ref 36.0–46.0)
Hemoglobin: 13.5 g/dL (ref 12.0–15.0)
MCH: 26.6 pg (ref 26.0–34.0)
MCHC: 30.5 g/dL (ref 30.0–36.0)
MCV: 87.4 fL (ref 80.0–100.0)
Platelets: 249 10*3/uL (ref 150–400)
RBC: 5.07 MIL/uL (ref 3.87–5.11)
RDW: 15.3 % (ref 11.5–15.5)
WBC: 7.3 10*3/uL (ref 4.0–10.5)
nRBC: 0 % (ref 0.0–0.2)

## 2023-06-17 LAB — GLUCOSE, CAPILLARY
Glucose-Capillary: 279 mg/dL — ABNORMAL HIGH (ref 70–99)
Glucose-Capillary: 237 mg/dL — ABNORMAL HIGH (ref 70–99)
Glucose-Capillary: 304 mg/dL — ABNORMAL HIGH (ref 70–99)
Glucose-Capillary: 269 mg/dL — ABNORMAL HIGH (ref 70–99)
Glucose-Capillary: 322 mg/dL — ABNORMAL HIGH (ref 70–99)

## 2023-06-17 LAB — RESPIRATORY PANEL BY PCR

## 2023-06-17 LAB — COMPREHENSIVE METABOLIC PANEL WITH GFR
ALT: 14 U/L (ref 0–44)
AST: 21 U/L (ref 15–41)
Albumin: 3.3 g/dL — ABNORMAL LOW (ref 3.5–5.0)
Alkaline Phosphatase: 88 U/L (ref 38–126)
Anion gap: 10 (ref 5–15)
BUN: 15 mg/dL (ref 8–23)
CO2: 19 mmol/L — ABNORMAL LOW (ref 22–32)
Calcium: 9 mg/dL (ref 8.9–10.3)
Chloride: 102 mmol/L (ref 98–111)
Creatinine, Ser: 0.86 mg/dL (ref 0.44–1.00)
GFR, Estimated: >60 mL/min (ref >60)
Glucose, Bld: 257 mg/dL — ABNORMAL HIGH (ref 70–99)
Potassium: 4.7 mmol/L (ref 3.5–5.1)
Sodium: 131 mmol/L — ABNORMAL LOW (ref 135–145)
Total Bilirubin: 0.8 mg/dL (ref 0.0–1.2)
Total Protein: 8.2 g/dL — ABNORMAL HIGH (ref 6.5–8.1)

## 2023-06-17 LAB — BLOOD GAS, VENOUS
Acid-base deficit: 2.6 mmol/L — ABNORMAL HIGH (ref 0.0–2.0)
Bicarbonate: 23.7 mmol/L (ref 20.0–28.0)
O2 Saturation: 95.1 %
Patient temperature: 37
pCO2, Ven: 46 mmHg (ref 44–60)
pH, Ven: 7.32 (ref 7.25–7.43)
pO2, Ven: 67 mmHg — ABNORMAL HIGH (ref 32–45)

## 2023-06-17 MED ORDER — INSULIN ASPART PROT & ASPART (70-30 MIX) 100 UNIT/ML ~~LOC~~ SUSP
28.0000 [IU] | Freq: Every day | SUBCUTANEOUS | Status: DC
  Filled 2023-06-17: qty 10

## 2023-06-17 MED ORDER — CHLORHEXIDINE GLUCONATE CLOTH 2 % EX PADS
6.0000 | MEDICATED_PAD | Freq: Every day | CUTANEOUS | Status: DC
  Administered 2023-06-17: 6 via TOPICAL

## 2023-06-17 MED ORDER — ORAL CARE MOUTH RINSE: 15.0000 mL | OROMUCOSAL | Status: DC | PRN

## 2023-06-17 MED ORDER — ALBUTEROL SULFATE (2.5 MG/3ML) 0.083% IN NEBU
2.5000 mg | INHALATION_SOLUTION | RESPIRATORY_TRACT | Status: DC | PRN
Start: 2023-06-17 — End: 2023-06-18

## 2023-06-17 MED ORDER — INSULIN ASPART PROT & ASPART (70-30 MIX) 100 UNIT/ML ~~LOC~~ SUSP
28.0000 [IU] | Freq: Two times a day (BID) | SUBCUTANEOUS | Status: DC
  Administered 2023-06-17 – 2023-06-18 (×2): 28 [IU] via SUBCUTANEOUS
  Filled 2023-06-17: qty 10

## 2023-06-17 MED ORDER — GUAIFENESIN-CODEINE 100-10 MG/5ML PO SOLN
5.0000 mL | ORAL | Status: DC | PRN
  Administered 2023-06-17 (×3): 5 mL via ORAL
  Filled 2023-06-17 (×3): qty 5

## 2023-06-17 MED ORDER — LEVOTHYROXINE SODIUM 100 MCG PO TABS
100.0000 ug | ORAL_TABLET | Freq: Every day | ORAL | Status: DC
  Administered 2023-06-18: 100 ug via ORAL
  Filled 2023-06-17: qty 1

## 2023-06-17 MED ORDER — GUAIFENESIN ER 600 MG PO TB12
600.0000 mg | ORAL_TABLET | Freq: Two times a day (BID) | ORAL | Status: DC
  Administered 2023-06-17 – 2023-06-18 (×2): 600 mg via ORAL
  Filled 2023-06-17 (×3): qty 1

## 2023-06-17 MED ORDER — ASPIRIN 81 MG PO TBEC
81.0000 mg | DELAYED_RELEASE_TABLET | Freq: Every day | ORAL | Status: DC
  Administered 2023-06-17 – 2023-06-18 (×2): 81 mg via ORAL
  Filled 2023-06-17 (×2): qty 1

## 2023-06-17 NOTE — Hospital Course (Addendum)
 67yo with h/o COPD, ILD, OSA, recent diagnosis of stage 1 RUL lung CA, stage 3a CKD, CAD, DM, HTN, HLD, and chronic HFpEF who presented on 5/11 with SOB.  She is supposed to be starting radiation this AM (5/12).  O2 75%, started on BIPAP -> HFNC O2.  CXR with patchy opacities; CT with enlarging mass and LAD from CA. She was diagnosed with COPD exacerbation and treated with nebs, steroids, and Azithromycin .

## 2023-06-17 NOTE — Inpatient Diabetes Management (Signed)
 Inpatient Diabetes Program Recommendations  AACE/ADA: New Consensus Statement on Inpatient Glycemic Control (2015)  Target Ranges:  Prepandial:   less than 140 mg/dL      Peak postprandial:   less than 180 mg/dL (1-2 hours)      Critically ill patients:  140 - 180 mg/dL   Lab Results  Component Value Date   GLUCAP 237 (H) 06/17/2023   HGBA1C 7.5 (H) 03/29/2023    Review of Glycemic Control  Latest Reference Range & Units 06/17/23 01:29 06/17/23 07:58  Glucose-Capillary 70 - 99 mg/dL 161 (H) 096 (H)  (H): Data is abnormally high Diabetes history: Type 2 DM Outpatient Diabetes medications: Novolog  70/30 60 units QA/ 40 units QP Current orders for Inpatient glycemic control: Novolog  70/30 28 units QP, Novolog  0-6 units TID & HS Solumedrol 125 mg x 1, 40 units BID  Inpatient Diabetes Program Recommendations:    Noted new insulin  orders.  Consider adding Novolog  70/30 28 units BID and A1C.   Thanks, Marjo Sievert, MSN, RNC-OB Diabetes Coordinator [phone] (8a-5p)

## 2023-06-17 NOTE — ED Notes (Signed)
 This RN called 2W for report; NS requested for this RN to call back in 10 minutes.

## 2023-06-17 NOTE — Plan of Care (Signed)
  Problem: Metabolic: Goal: Ability to maintain appropriate glucose levels will improve Outcome: Progressing   Problem: Clinical Measurements: Goal: Ability to maintain clinical measurements within normal limits will improve Outcome: Progressing Goal: Diagnostic test results will improve Outcome: Progressing Goal: Respiratory complications will improve Outcome: Progressing Goal: Cardiovascular complication will be avoided Outcome: Progressing   Problem: Respiratory: Goal: Ability to maintain a clear airway will improve Outcome: Progressing Goal: Levels of oxygenation will improve Outcome: Progressing Goal: Ability to maintain adequate ventilation will improve Outcome: Progressing

## 2023-06-17 NOTE — Plan of Care (Signed)

## 2023-06-17 NOTE — Progress Notes (Signed)
 Progress Note   Patient: [name] LKG:[id number] DOB: Oct 19, 1955 DOA: 06/16/2023     1 DOS: the patient was seen and examined on 06/17/2023   Brief hospital course: 67yo with h/o COPD, ILD, OSA, recent diagnosis of stage 1 RUL lung CA, stage 3a CKD, CAD, DM, HTN, HLD, and chronic HFpEF who presented on 5/11 with SOB.  She is supposed to be starting radiation this AM (5/12).  O2 75%, started on BIPAP -> HFNC O2.  CXR with patchy opacities; CT with enlarging mass and LAD from CA. She was diagnosed with COPD exacerbation and treated with nebs, steroids, and Azithromycin .  Assessment and Plan:  ILD/COPD with acute exacerbation, acute respiratory failure Patient presented with shortness of breath not improving with nebulizer treatment at home O2 sat 73% room air with significant respiratory distress  Placed on BiPAP with 1 hour of albuterol  nebulizer treatment with improvement Transitioned to nasal cannula oxygen, weaning; will order ambulatory pulse ox for tomorrow Chest x-ray airspace disease and interstitial opacity but Chest CT negative for PE/PNA Chest CT did show right apical nodule (known diagnosis of lung cancer), appears to be worsening (see below) Continue DuoNeb every 6 hours,Albuterol  q2h prn, steroids, azithromycin  500 mg daily (EKG showing no QTc prolongation).   New diagnosis of stage I lung cancer Recent diagnosis of stage I lung cancer  Due to start radiation therapy 5/12 Due to baseline for functional status from interstitial lung disease and COPD, not a good candidate for surgery per oncology Discussed with Dr. Lurena Sally - with progressive changes on CT, will initiate radiation today as inpatient   History of CAD Continue aspirin , Lipitor  and atenolol    History of PAD/carotid artery stenosis Continue aspirin  and Lipitor     Essential hypertension/chronic HFpEF Continue amlodipine , spironolactone , and atenolol    History of obstructive sleep apnea Continue supplemental  oxygen at bedtime   HLD Continue Crestor    Overactive bladder Continue oxybutynin    Insulin -dependent DM type II Recent A1c 7.5, decent control; recheck pending Continue NovoLog  70/30 BID   Continue sliding scale insulin  with mealtime coverage DM coordinator consulted   CKD stage IIIb Renal function at baseline Avoid nephrotoxic agents  Class 2 Obesity Body mass index is 36.87 kg/m.Aaron Aas  Weight loss should be encouraged Outpatient PCP/bariatric medicine f/u encouraged Significantly low or high BMI is associated with higher medical risk including morbidity and mortality      Consultants: None  Procedures: None  Antibiotics: Azithromycin  5/11-16  30 Day Unplanned Readmission Risk Score    Flowsheet Row ED to Hosp-Admission (Current) from 06/16/2023 in [facility]-ICU/STEPDOWN  30 Day Unplanned Readmission Risk Score (%) 22.89 Filed at 06/17/2023 0401       This score is the patient's risk of an unplanned readmission within 30 days of being discharged (0 -100%). The score is based on dignosis, age, lab data, medications, orders, and past utilization.   Low:  0-14.9   Medium: 15-21.9   High: 22-29.9   Extreme: 30 and above           Subjective: Feeling some better, able to wean O2 down to 2L.  Pleased that she is going to start radiation today.   Objective: Vitals:   06/17/23 1200 06/17/23 1300  BP:    Pulse: 87 81  Resp: 18 19  Temp:    SpO2: (!) 87% 93%    Intake/Output Summary (Last 24 hours) at 06/17/2023 1413 Last data filed at 06/17/2023 1300 Gross per 24 hour  Intake  970 ml  Output 725 ml  Net 245 ml   Filed Weights   06/16/23 1722 06/17/23 0427  Weight: 97.7 kg 100.5 kg    Exam:  General:  Appears calm and comfortable and is in NAD, appears younger than stated age Eyes:   EOMI, normal lids, iris ENT:  grossly normal hearing, lips & tongue, mmm Neck:  no LAD, masses or thyromegaly Cardiovascular:  RRR, no m/r/g. No LE  edema.  Respiratory:   CTA bilaterally with no wheezes/rales/rhonchi.  Normal respiratory effort. Abdomen:  soft, NT, ND Skin:  no rash or induration seen on limited exam Musculoskeletal:  grossly normal tone BUE/BLE, good ROM, no bony abnormality Psychiatric:  grossly normal mood and affect, speech fluent and appropriate, AOx3 Neurologic:  CN 2-12 grossly intact, moves all extremities in coordinated fashion  Data Reviewed: I have reviewed the patient's lab results since admission.  Pertinent labs for today include:   ABG: 7.32/46/67/23.7/95% Na++ 131 CO2 19, down from 22 on 5/11 Glucose 257 Albumin  3.3 Normal CBC    Family Communication: None present  Disposition: Status is: Inpatient Remains inpatient appropriate because: acute illness     Time spent: 50 minutes  Unresulted Labs (From admission, onward)     Start     Ordered   06/18/23 0500  CBC with Differential/Platelet  Tomorrow morning,   R       Question:  Specimen collection method  Answer:  Lab=Lab collect   06/17/23 1413   06/18/23 0500  Basic metabolic panel with GFR  Tomorrow morning,   R       Question:  Specimen collection method  Answer:  Lab=Lab collect   06/17/23 1413   06/17/23 1213  Hemoglobin A1c  Once,   R       Question:  Specimen collection method  Answer:  Lab=Lab collect   06/17/23 1212             Author: Lorita Rosa, MD 06/17/2023 2:13 PM  For on call review [email or web address].

## 2023-06-17 NOTE — TOC Initial Note (Addendum)
 Transition of Care [facility]) - Initial/Assessment Note    Patient Details  Name: [name] MRN: [id number] Date of Birth: 1955-09-11  [facility] CM/SW Contact:    Ruben Corolla, RN Phone Number: 06/17/2023, 12:02 PM  Clinical Narrative:  d/c plan home.                Expected Discharge Plan: Home/Self Care Barriers to Discharge: Continued Medical Work up   Patient Goals and CMS Choice Patient states their goals for this hospitalization and ongoing recovery are:: Home CMS Medicare.gov Compare Post Acute Care list provided to:: Patient Choice offered to / list presented to : Patient [place] ownership interest in [facility].provided to:: Patient    Expected Discharge Plan and Services                                              Prior Living Arrangements/Services                       Activities of Daily Living   ADL Screening (condition at time of admission) Independently performs ADLs?: Yes (appropriate for developmental age) Is the patient deaf or have difficulty hearing?: No Does the patient have difficulty seeing, even when wearing glasses/contacts?: No Does the patient have difficulty concentrating, remembering, or making decisions?: Yes  Permission Sought/Granted                  Emotional Assessment              Admission diagnosis:  COPD exacerbation (HCC) [J44.1] COPD with acute exacerbation (HCC) [J44.1] Acute hypoxic respiratory failure (HCC) [J96.01] Patient Active Problem List   Diagnosis Date Noted   Acute hypoxic respiratory failure (HCC) 06/16/2023   Obstructive sleep apnea 06/16/2023   Chronic kidney disease (CKD), stage III (moderate) (HCC) 06/16/2023   GAD (generalized anxiety disorder) 06/16/2023   History of CAD (coronary artery disease) 06/16/2023   Hepatic steatosis 06/16/2023   History of prolonged Q-T interval on ECG 06/16/2023   Chronic diastolic CHF (congestive heart failure)  (HCC) 06/16/2023   Overactive bladder 06/16/2023   COPD with acute exacerbation (HCC) 06/05/2023   Cancer of upper lobe of right lung (HCC) 05/31/2023   Pulmonary nodule 1 cm or greater in diameter 04/30/2023   Hydronephrosis with urinary obstruction due to renal calculus 03/29/2023   Chronic cough 06/07/2021   Mediastinal adenopathy 05/08/2021   Migraine 06/23/2020   RUQ pain 06/20/2020   Generalized abdominal pain    Interstitial lung disease (HCC) 11/04/2019   Pulmonary nodules/lesions, multiple 06/05/2019   Common migraine with intractable migraine 06/04/2019   Spondylolisthesis, lumbar region 01/03/2017   Pain in right leg 01/03/2017   Hyperglycemia 04/09/2014   Diabetic hyperosmolar non-ketotic state (HCC) 04/09/2014   Hypothyroidism 04/09/2014   Hypertension 04/09/2014   Peripheral artery disease (HCC) 11/02/2013   Carotid stenosis 01/08/2013   Left carotid bruit 12/15/2012   Hypertensive urgency 12/02/2012   TIA (transient ischemic attack) 12/02/2012   Headache 12/02/2012   Hypokalemia 02/10/2012   Obesity (BMI 30-39.9) 02/09/2012   AKI (acute kidney injury) (HCC) 02/09/2012   Dehydration 02/09/2012   SBO (small bowel obstruction) (HCC) 02/08/2012   Pleuritic chest pain 12/14/2011   SOB (shortness of breath) 12/14/2011   Hypotension 12/14/2011   ARF (acute renal failure) (HCC) 12/14/2011   Insulin   dependent type 2 diabetes mellitus (HCC) 12/14/2011   Hyperlipidemia 12/14/2011   CAP (community acquired pneumonia) 12/14/2011   PCP:  Olin Bertin, MD Pharmacy:   [facility] [phone] - [address] [place] - [address] [place] [address] Phone: [phone] Fax: [phone]  [facility] - [place], [place] - [address] [place] [address] Phone: [phone] Fax: [phone]  [facility] [address] [place] [address] Phone: [phone] Fax:  [phone]  [id number] [place], [place] - [address] AT [facility] [address] [place] [address] Phone: [phone] Fax: [phone]     Social Drivers of Health (SDOH) Social History: SDOH Screenings   Food Insecurity: No Food Insecurity (06/17/2023)  Housing: Low Risk  (06/17/2023)  Transportation Needs: No Transportation Needs (06/17/2023)  Utilities: Not At Risk (06/17/2023)  Depression (PHQ2-9): Low Risk  (11/29/2018)  Financial Resource Strain: Low Risk  (02/07/2022)   Received from [facility], [facility]  Social Connections: Socially Isolated (06/17/2023)  Stress: No Stress Concern Present (02/07/2022)   Received from [facility], [facility]  Tobacco Use: Medium Risk (06/16/2023)   SDOH Interventions:     Readmission Risk Interventions     No data to display

## 2023-06-18 ENCOUNTER — Ambulatory Visit: Admitting: Radiation Oncology

## 2023-06-18 ENCOUNTER — Other Ambulatory Visit (HOSPITAL_COMMUNITY): Payer: Self-pay

## 2023-06-18 DIAGNOSIS — J441 Chronic obstructive pulmonary disease with (acute) exacerbation: Secondary | ICD-10-CM | POA: Diagnosis not present

## 2023-06-18 LAB — CBC WITH DIFFERENTIAL/PLATELET
Abs Immature Granulocytes: 0.08 10*3/uL — ABNORMAL HIGH (ref 0.00–0.07)
Basophils Absolute: 0 10*3/uL (ref 0.0–0.1)
Basophils Relative: 0 %
Eosinophils Absolute: 0 10*3/uL (ref 0.0–0.5)
Eosinophils Relative: 0 %
HCT: 40.2 % (ref 36.0–46.0)
Hemoglobin: 12.6 g/dL (ref 12.0–15.0)
Immature Granulocytes: 1 %
Lymphocytes Relative: 8 %
Lymphs Abs: 1.1 10*3/uL (ref 0.7–4.0)
MCH: 26.6 pg (ref 26.0–34.0)
MCHC: 31.3 g/dL (ref 30.0–36.0)
MCV: 84.8 fL (ref 80.0–100.0)
Monocytes Absolute: 0.5 10*3/uL (ref 0.1–1.0)
Monocytes Relative: 4 %
Neutro Abs: 11.6 10*3/uL — ABNORMAL HIGH (ref 1.7–7.7)
Neutrophils Relative %: 87 %
Platelets: 276 10*3/uL (ref 150–400)
RBC: 4.74 MIL/uL (ref 3.87–5.11)
RDW: 15 % (ref 11.5–15.5)
WBC: 13.3 10*3/uL — ABNORMAL HIGH (ref 4.0–10.5)
nRBC: 0 % (ref 0.0–0.2)

## 2023-06-18 LAB — BASIC METABOLIC PANEL WITH GFR
Anion gap: 9 (ref 5–15)
BUN: 20 mg/dL (ref 8–23)
CO2: 20 mmol/L — ABNORMAL LOW (ref 22–32)
Calcium: 9.1 mg/dL (ref 8.9–10.3)
Chloride: 103 mmol/L (ref 98–111)
Creatinine, Ser: 0.69 mg/dL (ref 0.44–1.00)
GFR, Estimated: >60 mL/min (ref >60)
Glucose, Bld: 278 mg/dL — ABNORMAL HIGH (ref 70–99)
Potassium: 4.4 mmol/L (ref 3.5–5.1)
Sodium: 132 mmol/L — ABNORMAL LOW (ref 135–145)

## 2023-06-18 LAB — GLUCOSE, CAPILLARY
Glucose-Capillary: 302 mg/dL — ABNORMAL HIGH (ref 70–99)
Glucose-Capillary: 325 mg/dL — ABNORMAL HIGH (ref 70–99)

## 2023-06-18 LAB — HEMOGLOBIN A1C
Hgb A1c MFr Bld: 7.7 % — ABNORMAL HIGH (ref 4.8–5.6)
Mean Plasma Glucose: 174 mg/dL

## 2023-06-18 MED ORDER — AZITHROMYCIN 500 MG PO TABS
ORAL_TABLET | ORAL | 0 refills | Status: AC
  Filled 2023-06-18: qty 3, 2d supply, fill #0

## 2023-06-18 MED ORDER — PREDNISONE 20 MG PO TABS: 40.0000 mg | ORAL_TABLET | Freq: Every day | ORAL | Status: DC

## 2023-06-18 MED ORDER — GUAIFENESIN ER 600 MG PO TB12
600.0000 mg | ORAL_TABLET | Freq: Two times a day (BID) | ORAL | 0 refills | Status: DC
  Filled 2023-06-18: qty 60, 30d supply, fill #0

## 2023-06-18 MED ORDER — GUAIFENESIN-CODEINE 100-10 MG/5ML PO SOLN
5.0000 mL | ORAL | 0 refills | Status: DC | PRN
  Filled 2023-06-18: qty 120, 4d supply, fill #0

## 2023-06-18 MED ORDER — AZITHROMYCIN 250 MG PO TABS: 500.0000 mg | ORAL_TABLET | Freq: Every day | ORAL | Status: DC

## 2023-06-18 MED ORDER — ALBUTEROL SULFATE (2.5 MG/3ML) 0.083% IN NEBU
2.5000 mg | INHALATION_SOLUTION | RESPIRATORY_TRACT | 12 refills | Status: DC | PRN
  Filled 2023-06-18: qty 75, 3d supply, fill #0

## 2023-06-18 MED ORDER — PREDNISONE 20 MG PO TABS
40.0000 mg | ORAL_TABLET | Freq: Every day | ORAL | 0 refills | Status: AC
  Filled 2023-06-18: qty 6, 3d supply, fill #0

## 2023-06-18 NOTE — Inpatient Diabetes Management (Signed)
 Inpatient Diabetes Program Recommendations  AACE/ADA: New Consensus Statement on Inpatient Glycemic Control (2015)  Target Ranges:  Prepandial:   less than 140 mg/dL      Peak postprandial:   less than 180 mg/dL (1-2 hours)      Critically ill patients:  140 - 180 mg/dL   Lab Results  Component Value Date   GLUCAP 325 (H) 06/18/2023   HGBA1C 7.7 (H) 06/17/2023    Review of Glycemic Control  Latest Reference Range & Units 06/17/23 17:48 06/17/23 21:17 06/18/23 07:41 06/18/23 11:52  Glucose-Capillary 70 - 99 mg/dL 161 (H) 096 (H) 045 (H) 325 (H)  (H): Data is abnormally high Diabetes history: Type 2 DM Outpatient Diabetes medications: Novolog  70/30 60 units QA/ 40 units QP Current orders for Inpatient glycemic control: Novolog  70/30 28 units QP, Novolog  0-6 units TID & HS Solumedrol 125 mg x 1, 40 units BID   Inpatient Diabetes Program Recommendations:   If to remain inpatient: Consider adding Novolog  70/30 40 units BID.  Secure chat sent to MD and discussed basal/bolus. Plan for DC home.  Thanks, Marjo Sievert, MSN, RNC-OB Diabetes Coordinator [phone] (8a-5p)

## 2023-06-18 NOTE — Discharge Summary (Addendum)
 Physician Discharge Summary   Patient: [name] MRN: [id number] DOB: [birth year]  Admit date:     06/16/2023  Discharge date: 06/18/23  Discharge Physician: Lorita Rosa   PCP: Olin Bertin, MD   Recommendations at discharge:   You are being discharged with steroids and azithromycin  for 3 more days Follow up for radiation treatment as scheduled Follow up with Dr. Alexia Angelucci in 1-2 weeks  Discharge Diagnoses: Principal Problem:   COPD with acute exacerbation (HCC) Active Problems:   Acute hypoxic respiratory failure (HCC)   Insulin  dependent type 2 diabetes mellitus (HCC)   Peripheral artery disease (HCC)   Interstitial lung disease (HCC)   Cancer of upper lobe of right lung (HCC)   Obstructive sleep apnea   Chronic kidney disease (CKD), stage III (moderate) (HCC)   GAD (generalized anxiety disorder)   History of CAD (coronary artery disease)   Hepatic steatosis   History of prolonged Q-T interval on ECG   Chronic diastolic CHF (congestive heart failure) (HCC)   Overactive bladder    Hospital Course: 67yo with h/o COPD, ILD, OSA, recent diagnosis of stage 1 RUL lung CA, stage 3a CKD, CAD, DM, HTN, HLD, and chronic HFpEF who presented on 5/11 with SOB.  She is supposed to be starting radiation this AM (5/12).  O2 75%, started on BIPAP -> HFNC O2.  CXR with patchy opacities; CT with enlarging mass and LAD from CA. She was diagnosed with COPD exacerbation and treated with nebs, steroids, and Azithromycin .  Started radiation treatment for cancer on 5/12.  Improving and stable for dc on 5/13.  Assessment and Plan:  ILD/COPD with acute exacerbation, acute respiratory failure Patient presented with shortness of breath not improving with nebulizer treatment at home O2 sat 73% room air with significant respiratory distress  Placed on BiPAP with 1 hour of albuterol  nebulizer treatment with improvement Transitioned to nasal cannula oxygen, weaning; will order ambulatory pulse ox  for tomorrow Chest x-ray airspace disease and interstitial opacity but Chest CT negative for PE/PNA Chest CT did show right apical nodule (known diagnosis of lung cancer), appears to be worsening (see below) Continue DuoNeb every 6 hours,Albuterol  q2h prn, steroids, azithromycin  500 mg daily (EKG showing no QTc prolongation) Breathing is significantly better No longer on O2 Appears stable for dc   New diagnosis of stage I lung cancer Recent diagnosis of stage I lung cancer  Due to start radiation therapy 5/12 Due to baseline for functional status from interstitial lung disease and COPD, not a good candidate for surgery per oncology Discussed with Dr. Lurena Sally - with progressive changes on CT, initiated radiation as inpatient She is scheduled for every other day radiation, next treatment tomorrow (5/14) D/w Dr. Lurena Sally and she is appropriate for dc to home today   History of CAD Continue aspirin , Lipitor  and atenolol    History of PAD/carotid artery stenosis Continue aspirin  and Lipitor     Essential hypertension/chronic HFpEF Continue amlodipine , spironolactone , and atenolol    History of obstructive sleep apnea Continue supplemental oxygen at bedtime   HLD Continue Crestor    Overactive bladder Continue oxybutynin    Insulin -dependent DM type II A1c 7.7, decent control Continue NovoLog  70/30 BID   Continue sliding scale insulin  with mealtime coverage DM coordinator consulted   CKD stage IIIb Renal function at baseline Avoid nephrotoxic agents   Class 2 Obesity Body mass index is 36.87 kg/m.Aaron Aas  Weight loss should be encouraged Outpatient PCP/bariatric medicine f/u encouraged Significantly low or high BMI is associated with higher  medical risk including morbidity and mortality          Consultants: None   Procedures: None   Antibiotics: Azithromycin  5/11-16   Pain control - [place]  Controlled Substance Reporting System database was reviewed. and patient  was instructed, not to drive, operate heavy machinery, perform activities at heights, swimming or participation in water  activities or provide baby-sitting services while on Pain, Sleep and Anxiety Medications; until their outpatient Physician has advised to do so again. Also recommended to not to take more than prescribed Pain, Sleep and Anxiety Medications.   Disposition: Home Diet recommendation:  Carb modified diet DISCHARGE MEDICATION: Allergies as of 06/18/2023       Reactions   Strawberry Extract Anaphylaxis   Cefdinir Other (See Comments)   Oxycodone  Hcl Nausea And Vomiting   Sulfamethoxazole-trimethoprim Rash   Topiramate  Other (See Comments)   GI Upset, Dizzy, HA, Blurred Vision   Cymbalta [duloxetine Hcl] Other (See Comments)   Dizzy   Lisinopril  Itching, Swelling, Other (See Comments)   Angioedema   Lunesta [eszopiclone] Other (See Comments)   Bad taste in mouth    Metformin  And Related Other (See Comments)   Damaged patient's kidneys   Other Other (See Comments)   Pt is a Jehovah Witness. No blood products.   Sulfa Antibiotics Itching, Swelling, Other (See Comments)   Angioedema   Tramadol Other (See Comments)   Hallucinations   Trazodone And Nefazodone Other (See Comments)   Shakes   Penicillins Rash, Other (See Comments)   "Paralyzed" (per patient) Has patient had a PCN reaction causing immediate rash, facial/tongue/throat swelling, SOB or lightheadedness with hypotension: Yes Has patient had a PCN reaction causing severe rash involving mucus membranes or skin necrosis: No Has patient had a PCN reaction that required hospitalization Yes Has patient had a PCN reaction occurring within the last 10 years: No If all of the above answers are "NO", then may proceed with Cephalosporin use.        Medication List     STOP taking these medications    ondansetron  4 MG tablet Commonly known as: Zofran        TAKE these medications    Accu-Chek Guide test  strip Generic drug: glucose blood   Accu-Chek Guide w/Device Kit   Accu-Chek Softclix Lancets lancets   albuterol  108 (90 Base) MCG/ACT inhaler Commonly known as: VENTOLIN  HFA Inhale 2 puffs into the lungs every 6 (six) hours as needed for wheezing or shortness of breath. What changed: Another medication with the same name was added. Make sure you understand how and when to take each.   albuterol  (2.5 MG/3ML) 0.083% nebulizer solution Commonly known as: PROVENTIL  Take 3 mLs (2.5 mg total) by nebulization every 2 (two) hours as needed for wheezing or shortness of breath. What changed: You were already taking a medication with the same name, and this prescription was added. Make sure you understand how and when to take each.   amLODipine  10 MG tablet Commonly known as: NORVASC  Take 10 mg by mouth in the morning.   aspirin  EC 81 MG tablet Take 1 tablet (81 mg total) by mouth daily. Swallow whole.   atenolol  100 MG tablet Commonly known as: TENORMIN  Take 1 tablet (100 mg total) by mouth daily.   azithromycin  500 MG tablet Commonly known as: ZITHROMAX  Take one tablet daily for 3 days starting 06/19/2023 Start taking on: Jun 19, 2023 What changed:  medication strength additional instructions   B-D UF III MINI PEN NEEDLES  31G X 5 MM Misc Generic drug: Insulin  Pen Needle   cilostazol  100 MG tablet Commonly known as: PLETAL  Take 1 tablet (100 mg total) by mouth 2 (two) times daily.   dicyclomine  10 MG capsule Commonly known as: BENTYL  Take 10 mg by mouth 3 (three) times daily before meals.   estradiol  0.1 MG/GM vaginal cream Commonly known as: ESTRACE  Apply 3 times weekly by using a pea-sized amount applied to fingertip   guaiFENesin  600 MG 12 hr tablet Commonly known as: MUCINEX  Take 1 tablet (600 mg total) by mouth 2 (two) times daily.   guaiFENesin -codeine  100-10 MG/5ML syrup Take 5 mLs by mouth every 4 (four) hours as needed for cough.   insulin  aspart protamine  -  aspart (70-30) 100 UNIT/ML FlexPen Commonly known as: NOVOLOG  70/30 MIX Inject 40-60 Units into the skin in the morning and at bedtime. Inject 60 units in the am & 40 units in the pm   levothyroxine  100 MCG tablet Commonly known as: SYNTHROID  Take 100 mcg by mouth daily before breakfast.   methenamine  1 g tablet Commonly known as: HIPREX  Take 1 tablet (1 g total) by mouth 2 (two) times daily with a meal. Take along with a vit C tablet as directed   predniSONE  20 MG tablet Commonly known as: DELTASONE  Take 2 tablets (40 mg total) by mouth daily with breakfast for 3 doses. Start taking on: Jun 19, 2023   Repatha  SureClick 140 MG/ML Soaj Generic drug: Evolocumab  INJECT 1 PEN INTO THE SKIN EVERY 14 (FOURTEEN) DAYS.   rosuvastatin  40 MG tablet Commonly known as: CRESTOR  Take 1 tablet (40 mg total) by mouth daily. What changed: Another medication with the same name was removed. Continue taking this medication, and follow the directions you see here.   spironolactone  25 MG tablet Commonly known as: ALDACTONE  Take 1 tablet (25 mg total) by mouth daily. What changed: when to take this   Vitamin D3 125 MCG (5000 UT) Tabs Take 5,000 Units by mouth in the morning.        Discharge Exam:   Subjective: Feeling much better, on RA.  Seen while she was coming out of the bathroom, ambulating without significant dyspnea.  She understood that treatment should happen inpatient and so was planning to stay for radiation tomorrow - but happy to go home and sleep in her home bed instead.   Objective: Vitals:   06/18/23 0420 06/18/23 0842  BP: 119/65   Pulse: 84   Resp: 18   Temp: 98.4 F (36.9 C)   SpO2: 95% 93%   No intake or output data in the 24 hours ending 06/18/23 1343  Filed Weights   06/16/23 1722 06/17/23 0427  Weight: 97.7 kg 100.5 kg    Exam:  General:  Appears calm and comfortable and is in NAD, appears younger than stated age Eyes:   EOMI, normal lids, iris ENT:   grossly normal hearing, lips & tongue, mmm Neck:  no LAD, masses or thyromegaly Cardiovascular:  RRR, no m/r/g. No LE edema.  Respiratory:   CTA bilaterally with no wheezes/rales/rhonchi.  Normal respiratory effort. Abdomen:  soft, NT, ND Skin:  no rash or induration seen on limited exam Musculoskeletal:  grossly normal tone BUE/BLE, good ROM, no bony abnormality Psychiatric:  grossly normal mood and affect, speech fluent and appropriate, AOx3 Neurologic:  CN 2-12 grossly intact, moves all extremities in coordinated fashion  Data Reviewed: I have reviewed the patient's lab results since admission.  Pertinent labs for today  include:  Na++ 132, improved and not clinically significant CO2, not clinically significant Glucose 278 WBC 13.3 A1c 7.7     Condition at discharge: improving  The results of significant diagnostics from this hospitalization (including imaging, microbiology, ancillary and laboratory) are listed below for reference.   Imaging Studies: CT Angio Chest PE W and/or Wo Contrast Result Date: 06/16/2023 CLINICAL DATA:  Pulmonary embolism (PE) suspected, high prob Wheezing and shortness of breath. Radiologic records indicates history of non-small cell lung cancer. EXAM: CT ANGIOGRAPHY CHEST WITH CONTRAST TECHNIQUE: Multidetector CT imaging of the chest was performed using the standard protocol during bolus administration of intravenous contrast. Multiplanar CT image reconstructions and MIPs were obtained to evaluate the vascular anatomy. RADIATION DOSE REDUCTION: This exam was performed according to the departmental dose-optimization program which includes automated exposure control, adjustment of the mA and/or kV according to patient size and/or use of iterative reconstruction technique. CONTRAST:  75mL OMNIPAQUE  IOHEXOL  350 MG/ML SOLN COMPARISON:  Radiograph earlier today. PET CT 03/30/2023. Chest CT 04/05/2023 FINDINGS: Cardiovascular: There are no filling defects within the  pulmonary arteries to suggest pulmonary embolus. The main pulmonary artery is dilated at 3.8 cm. Diffuse aortic atherosclerosis without acute aortic findings. Mitral annulus and coronary artery calcifications. The heart is upper normal in size. No pericardial effusion. Mediastinum/Nodes: Bilateral hilar adenopathy, likely increased although comparison with priors is difficult in the absence of IV contrast. Suspected paratracheal adenopathy, ill-defined soft tissue spanning 14 mm series 4, image 48. Prominent epicardial nodes are unchanged, series 4, image 101. No esophageal wall thickening. Lungs/Pleura: Right apical nodule measures 2.6 x 2.4 cm, previously 2.2 x 1.7 cm. Subpleural left lower lobe nodule measures 10 x 8 mm, series 12, image 77 unchanged in size from prior PET. Perifissural nodule in the right mid lung, series 12, image 57, unchanged and likely an intrapulmonary lymph node. Diffuse heterogeneous ground-glass opacities, greatest in the right lung, without significant interval change. There is moderate emphysema. Bowing of the trachea may be due to expiratory phase imaging. There is prominent central bronchial thickening and narrowing. Areas of bronchial occlusion in the subsegmental right lower lobe. No confluent consolidation. No significant pleural effusion. Upper Abdomen: No acute upper abdominal findings. Musculoskeletal: There are no acute or suspicious osseous abnormalities. Scattered thoracic spondylosis. No acute chest wall soft tissue abnormalities. Review of the MIP images confirms the above findings. IMPRESSION: 1. No pulmonary embolus. 2. Enlarging right upper lobe pulmonary nodule likely site of primary malignancy. Left lower lobe pulmonary nodule is stable. 3. Diffuse heterogeneous ground-glass opacities, greatest in the right lung, without significant interval change. This is chronic and not significantly changed from prior imaging. 4. Bilateral hilar adenopathy, likely increased  although comparison with priors is difficult in the absence of IV contrast. Suspected paratracheal adenopathy, ill-defined soft tissue spanning 14 mm. Attention at oncologic follow-up recommended. 5. Dilated main pulmonary artery, can be seen with pulmonary arterial hypertension. 6. Aortic atherosclerosis and coronary artery calcifications. Aortic Atherosclerosis (ICD10-I70.0) and Emphysema (ICD10-J43.9). Electronically Signed   By: Chadwick Colonel M.D.   On: 06/16/2023 22:06   DG Chest 2 View Result Date: 06/16/2023 CLINICAL DATA:  shob EXAM: CHEST - 2 VIEW COMPARISON:  Chest x-ray 05/14/2023 trauma PET CT 05/28/2023 FINDINGS: The heart and mediastinal contours are within normal limits. Persistent patchy airspace and interstitial opacities. No pleural effusion. No pneumothorax. No acute osseous abnormality. IMPRESSION: Persistent patchy airspace and interstitial opacities. Electronically Signed   By: Morgane  Naveau M.D.   On: 06/16/2023  20:12   MR BRAIN W WO CONTRAST Result Date: 05/31/2023 CLINICAL DATA:  Non-small cell lung cancer, staging EXAM: MRI HEAD WITHOUT AND WITH CONTRAST TECHNIQUE: Multiplanar, multiecho pulse sequences of the brain and surrounding structures were obtained without and with intravenous contrast. CONTRAST:  9mL GADAVIST  GADOBUTROL  1 MMOL/ML IV SOLN COMPARISON:  2022 FINDINGS: Brain: There is no acute infarction or intracranial hemorrhage. There is no intracranial mass, mass effect, or edema. There is no hydrocephalus or extra-axial fluid collection. Ventricles and sulci within normal in configuration. Patchy foci of T2 hyperintensity in the supratentorial white matter are nonspecific but may reflect similar mild microvascular ischemic changes. No abnormal enhancement. Vascular: Major vessel flow voids at the skull base are preserved. Skull and upper cervical spine: Normal marrow signal is preserved. Sinuses/Orbits: Paranasal sinuses are aerated. Right lens replacement. Other: Sella  is unremarkable.  Mastoid air cells are clear. IMPRESSION: No evidence of intracranial metastatic disease. Electronically Signed   By: Geannie Keener M.D.   On: 05/31/2023 13:15   NM PET Image Restage (PS) Skull Base to Thigh (F-18 FDG) Result Date: 05/31/2023 CLINICAL DATA:  Subsequent treatment strategy for non-small-cell lung cancer. EXAM: NUCLEAR MEDICINE PET SKULL BASE TO THIGH TECHNIQUE: 10.496 mCi F-18 FDG was injected intravenously. Full-ring PET imaging was performed from the skull base to thigh after the radiotracer. CT data was obtained and used for attenuation correction and anatomic localization. Fasting blood glucose: 164 mg/dl COMPARISON:  PET-CT 16/11/9602.  Chest CT 03/28/2023 and older. FINDINGS: Mediastinal blood pool activity: SUV max 3.3 Liver activity: SUV max 4.1 NECK: No specific abnormal radiotracer uptake identified in the neck including along lymph node change of the submandibular, posterior triangle internal jugular region. There is symmetric uptake along the visualized intracranial compartment. Incidental CT findings: The parotid glands, submandibular glands and thyroid  glands unremarkable. The visualized paranasal sinuses and mastoid air cells are clear. Scattered vascular calcifications are identified. CHEST: There is a nodular mass at the right lung apex which is hypermetabolic. This has maximum SUV value of 16.5. On the prior study this measured 2.1 x 1.5 cm and today on CT image 58 this measures 2.2 by 1.7 cm. Again this abuts the pleura posterolateral. Worrisome for neoplasm. Lungs have some patchy parenchymal ground-glass like opacities with interstitial thickening. There is some mild patchy uptake noted along the lungs. Previously there is also a nodule identified in the posterior left upper lobe abutting the interlobar fissure. These do not show any abnormal uptake as seen on CT T image 60 and 58. The left lower lobe lesion has some uptake of maximum SUV value of 3.3 and the  lesion on image 78 measures 8 mm today. This lesion however has been present since at least March 2023 demonstrating 2 years of stability. No significant abnormal uptake along the axillary regions, hilum or mediastinum for nodes. Incidental CT findings: Heart is enlarged. Coronary artery calcifications are seen. There is diffuse vascular calcifications along the aorta. Diameter of the ascending aorta approaches 4.2 cm. ABDOMEN/PELVIS: There is physiologic distribution radiotracer along the parenchymal organs, bowel and renal collecting systems. There is some uptake along the mesh along the anterior pelvic and abdominal wall, nonspecific. Maximum SUV of 11.1. Please correlate for any symptoms. This was seen previously. Incidental CT findings: Diffuse vascular calcifications. No abnormal calcifications are seen within either kidney nor along the course of either ureter. Hepatic granulomas. Surgical changes from mesh along the anterior abdominopelvic wall. Surgical changes along the cecum. No bowel obstruction. No  free air or free fluid. Gallbladder is present. SKELETON: No abnormal uptake identified along the visualized osseous structures. Incidental CT findings: Scattered degenerative changes along the spine. There are pars defects at L5 with trace anterolisthesis, grade 1. Intramedullary rod along the right femur. Degenerative changes of the pelvis and shoulders. IMPRESSION: Significant abnormal uptake along the nodule at the right upper lobe, lung apex worrisome for neoplasm. Mild uptake along the small nodule left lower lobe and no uptake along the nodules in the left upper lobe abutting the interlobar fissure. These lesions however were seen on the remote study of 2023 and are stable. No additional areas of uptake to suggest metastatic disease. Diffuse patchy lung opacities with ground-glass and interstitial changes. Enlarged heart. Ectasia of the ascending aorta 4.2 cm. Recommend annual imaging followup by CTA  or MRA. This recommendation follows 2010 ACCF/AHA/AATS/ACR/ASA/SCA/SCAI/SIR/STS/SVM Guidelines for the Diagnosis and Management of Patients with Thoracic Aortic Disease. Circulation. 2010; 121: W098-J191. Aortic aneurysm NOS (ICD10-I71.9) Stable uptake along the anterior abdominal wall hernia mesh. Electronically Signed   By: Adrianna Horde M.D.   On: 05/31/2023 11:06    Microbiology: Results for orders placed or performed during the hospital encounter of 06/16/23  Respiratory (~20 pathogens) panel by PCR     Status: None   Collection Time: 06/17/23 12:36 AM   Specimen: Nasopharyngeal Swab; Respiratory  Result Value Ref Range Status   Adenovirus NOT DETECTED NOT DETECTED Final   Coronavirus 229E NOT DETECTED NOT DETECTED Final    Comment: (NOTE) The Coronavirus on the Respiratory Panel, DOES NOT test for the novel  Coronavirus (2019 nCoV)    Coronavirus HKU1 NOT DETECTED NOT DETECTED Final   Coronavirus NL63 NOT DETECTED NOT DETECTED Final   Coronavirus OC43 NOT DETECTED NOT DETECTED Final   Metapneumovirus NOT DETECTED NOT DETECTED Final   Rhinovirus / Enterovirus NOT DETECTED NOT DETECTED Final   Influenza A NOT DETECTED NOT DETECTED Final   Influenza B NOT DETECTED NOT DETECTED Final   Parainfluenza Virus 1 NOT DETECTED NOT DETECTED Final   Parainfluenza Virus 2 NOT DETECTED NOT DETECTED Final   Parainfluenza Virus 3 NOT DETECTED NOT DETECTED Final   Parainfluenza Virus 4 NOT DETECTED NOT DETECTED Final   Respiratory Syncytial Virus NOT DETECTED NOT DETECTED Final   Bordetella pertussis NOT DETECTED NOT DETECTED Final   Bordetella Parapertussis NOT DETECTED NOT DETECTED Final   Chlamydophila pneumoniae NOT DETECTED NOT DETECTED Final   Mycoplasma pneumoniae NOT DETECTED NOT DETECTED Final    Comment: Performed at [facility], [address], [place], [place] [address]  MRSA Next Gen by PCR, Nasal     Status: None   Collection Time: 06/17/23 10:25 AM   Specimen: Nasal Mucosa;  Nasal Swab  Result Value Ref Range Status   MRSA by PCR Next Gen NOT DETECTED NOT DETECTED Final    Comment: (NOTE) The GeneXpert MRSA Assay (FDA approved for NASAL specimens only), is one component of a comprehensive MRSA colonization surveillance program. It is not intended to diagnose MRSA infection nor to guide or monitor treatment for MRSA infections. Test performance is not FDA approved in patients less than 18 years old. Performed at [facility], [address] [place], [place] [address]     Labs: CBC: Recent Labs  Lab 06/16/23 1737 06/17/23 0328 06/18/23 0524  WBC 6.6 7.3 13.3*  NEUTROABS  --   --  11.6*  HGB 13.9 13.5 12.6  HCT 43.3 44.3 40.2  MCV 84.2  87.4 84.8  PLT 269 249 276   Basic Metabolic Panel: Recent Labs  Lab 06/16/23 1737 06/17/23 0328 06/18/23 0524  NA 134* 131* 132*  K 5.1 4.7 4.4  CL 102 102 103  CO2 22 19* 20*  GLUCOSE 211* 257* 278*  BUN 14 15 20   CREATININE 1.10* 0.86 0.69  CALCIUM  8.9 9.0 9.1   Liver Function Tests: Recent Labs  Lab 06/17/23 0328  AST 21  ALT 14  ALKPHOS 88  BILITOT 0.8  PROT 8.2*  ALBUMIN  3.3*   CBG: Recent Labs  Lab 06/17/23 1200 06/17/23 1748 06/17/23 2117 06/18/23 0741 06/18/23 1152  GLUCAP 304* 269* 322* 302* 325*    Discharge time spent: greater than 30 minutes.  Signed: Lorita Rosa, MD [facility] 06/18/2023

## 2023-06-19 ENCOUNTER — Other Ambulatory Visit: Payer: Self-pay

## 2023-06-19 ENCOUNTER — Ambulatory Visit
Admission: RE | Admit: 2023-06-19 | Discharge: 2023-06-19 | Disposition: A | Discharge status: Home / Self Care | Source: Ambulatory Visit | Attending: Radiation Oncology | Admitting: Radiation Oncology

## 2023-06-19 DIAGNOSIS — Z87891 Personal history of nicotine dependence: Secondary | ICD-10-CM | POA: Diagnosis not present

## 2023-06-19 DIAGNOSIS — Z510 Encounter for antineoplastic radiation therapy: Secondary | ICD-10-CM | POA: Diagnosis not present

## 2023-06-19 DIAGNOSIS — C3411 Malignant neoplasm of upper lobe, right bronchus or lung: Secondary | ICD-10-CM | POA: Diagnosis not present

## 2023-06-19 LAB — RAD ONC ARIA SESSION SUMMARY
Course Elapsed Days: 2
Plan Fractions Treated to Date: 2
Plan Prescribed Dose Per Fraction: 12 Gy
Plan Total Fractions Prescribed: 5
Plan Total Prescribed Dose: 60 Gy
Reference Point Dosage Given to Date: 24 Gy
Reference Point Session Dosage Given: 12 Gy
Session Number: 2

## 2023-06-20 ENCOUNTER — Ambulatory Visit: Admitting: Radiation Oncology

## 2023-06-21 ENCOUNTER — Other Ambulatory Visit: Payer: Self-pay | Admitting: Radiation Oncology

## 2023-06-21 ENCOUNTER — Ambulatory Visit
Admission: RE | Admit: 2023-06-21 | Discharge: 2023-06-21 | Disposition: A | Discharge status: Home / Self Care | Source: Ambulatory Visit | Attending: Radiation Oncology | Admitting: Radiation Oncology

## 2023-06-21 ENCOUNTER — Other Ambulatory Visit: Payer: Self-pay

## 2023-06-21 DIAGNOSIS — C3411 Malignant neoplasm of upper lobe, right bronchus or lung: Secondary | ICD-10-CM | POA: Diagnosis not present

## 2023-06-21 DIAGNOSIS — Z87891 Personal history of nicotine dependence: Secondary | ICD-10-CM | POA: Diagnosis not present

## 2023-06-21 DIAGNOSIS — Z510 Encounter for antineoplastic radiation therapy: Secondary | ICD-10-CM | POA: Diagnosis not present

## 2023-06-21 LAB — RAD ONC ARIA SESSION SUMMARY
Course Elapsed Days: 4
Plan Fractions Treated to Date: 3
Plan Prescribed Dose Per Fraction: 12 Gy
Plan Total Fractions Prescribed: 5
Plan Total Prescribed Dose: 60 Gy
Reference Point Dosage Given to Date: 36 Gy
Reference Point Session Dosage Given: 12 Gy
Session Number: 3

## 2023-06-21 MED ORDER — GUAIFENESIN-CODEINE 100-10 MG/5ML PO SOLN
7.5000 mL | ORAL | 0 refills | Status: DC | PRN
Start: 2023-06-21 — End: 2023-08-03

## 2023-06-24 ENCOUNTER — Other Ambulatory Visit: Payer: Self-pay

## 2023-06-24 ENCOUNTER — Ambulatory Visit
Admission: RE | Admit: 2023-06-24 | Discharge: 2023-06-24 | Disposition: A | Discharge status: Home / Self Care | Payer: Self-pay | Source: Ambulatory Visit | Attending: Radiation Oncology | Admitting: Radiation Oncology

## 2023-06-24 ENCOUNTER — Ambulatory Visit
Admission: RE | Admit: 2023-06-24 | Discharge: 2023-06-24 | Disposition: A | Discharge status: Home / Self Care | Source: Ambulatory Visit | Attending: Radiation Oncology | Admitting: Radiation Oncology

## 2023-06-24 DIAGNOSIS — Z510 Encounter for antineoplastic radiation therapy: Secondary | ICD-10-CM | POA: Diagnosis not present

## 2023-06-24 DIAGNOSIS — Z87891 Personal history of nicotine dependence: Secondary | ICD-10-CM | POA: Diagnosis not present

## 2023-06-24 DIAGNOSIS — C3411 Malignant neoplasm of upper lobe, right bronchus or lung: Secondary | ICD-10-CM

## 2023-06-24 LAB — RAD ONC ARIA SESSION SUMMARY
Course Elapsed Days: 7
Plan Fractions Treated to Date: 4
Plan Prescribed Dose Per Fraction: 12 Gy
Plan Total Fractions Prescribed: 5
Plan Total Prescribed Dose: 60 Gy
Reference Point Dosage Given to Date: 48 Gy
Reference Point Session Dosage Given: 12 Gy
Session Number: 4

## 2023-06-26 ENCOUNTER — Other Ambulatory Visit: Payer: Self-pay

## 2023-06-26 ENCOUNTER — Ambulatory Visit
Admission: RE | Admit: 2023-06-26 | Discharge: 2023-06-26 | Disposition: A | Discharge status: Home / Self Care | Source: Ambulatory Visit | Attending: Radiation Oncology | Admitting: Radiation Oncology

## 2023-06-26 DIAGNOSIS — Z87891 Personal history of nicotine dependence: Secondary | ICD-10-CM | POA: Diagnosis not present

## 2023-06-26 DIAGNOSIS — Z510 Encounter for antineoplastic radiation therapy: Secondary | ICD-10-CM | POA: Diagnosis not present

## 2023-06-26 DIAGNOSIS — C3411 Malignant neoplasm of upper lobe, right bronchus or lung: Secondary | ICD-10-CM | POA: Diagnosis not present

## 2023-06-26 LAB — RAD ONC ARIA SESSION SUMMARY
Course Elapsed Days: 9
Plan Fractions Treated to Date: 5
Plan Prescribed Dose Per Fraction: 12 Gy
Plan Total Fractions Prescribed: 5
Plan Total Prescribed Dose: 60 Gy
Reference Point Dosage Given to Date: 60 Gy
Reference Point Session Dosage Given: 12 Gy
Session Number: 5

## 2023-06-27 ENCOUNTER — Encounter: Payer: Self-pay | Admitting: Cardiovascular Disease

## 2023-06-27 DIAGNOSIS — I701 Atherosclerosis of renal artery: Secondary | ICD-10-CM | POA: Diagnosis not present

## 2023-06-27 DIAGNOSIS — I10 Essential (primary) hypertension: Secondary | ICD-10-CM | POA: Diagnosis not present

## 2023-06-27 DIAGNOSIS — I700 Atherosclerosis of aorta: Secondary | ICD-10-CM | POA: Diagnosis not present

## 2023-06-27 DIAGNOSIS — J441 Chronic obstructive pulmonary disease with (acute) exacerbation: Secondary | ICD-10-CM | POA: Diagnosis not present

## 2023-06-27 DIAGNOSIS — E1121 Type 2 diabetes mellitus with diabetic nephropathy: Secondary | ICD-10-CM | POA: Diagnosis not present

## 2023-06-27 DIAGNOSIS — C3411 Malignant neoplasm of upper lobe, right bronchus or lung: Secondary | ICD-10-CM | POA: Diagnosis not present

## 2023-06-27 DIAGNOSIS — M1990 Unspecified osteoarthritis, unspecified site: Secondary | ICD-10-CM | POA: Diagnosis not present

## 2023-06-27 NOTE — Radiation Completion Notes (Signed)
 Patient Name: [name], [name] MRN: [id number] Date of Birth: [birth year] Referring Physician: Racheal Buddle, M.D. Date of Service: 2023-06-27 Radiation Oncologist: Colie Dawes, M.D. [facility] -                              RADIATION ONCOLOGY END OF TREATMENT NOTE     Diagnosis: C34.11 Malignant neoplasm of upper lobe, right bronchus or lung Staging on 2023-05-31: Cancer of upper lobe of right lung (HCC) T=cT1c, N=cN0, M=cM0 Intent: Curative     ==========DELIVERED PLANS==========  First Treatment Date: 2023-06-17 Last Treatment Date: 2023-06-26   Plan Name: Lung_R_SBRT Site: Lung, Right Technique: SBRT/SRT-IMRT Mode: Photon Dose Per Fraction: 12 Gy Prescribed Dose (Delivered / Prescribed): 60 Gy / 60 Gy Prescribed Fxs (Delivered / Prescribed): 5 / 5     ==========ON TREATMENT VISIT DATES========== 2023-06-17, 2023-06-17, 2023-06-19, 2023-06-21, 2023-06-21, 2023-06-24, 2023-06-24, 2023-06-26     ==========UPCOMING VISITS==========       ==========APPENDIX - ON TREATMENT VISIT NOTES==========   See weekly On Treatment Notes in Epic for details in the Media tab (listed as Progress notes on the On Treatment Visit Dates listed above).

## 2023-07-01 ENCOUNTER — Observation Stay (HOSPITAL_COMMUNITY)
Admission: EM | Admit: 2023-07-01 | Discharge: 2023-07-02 | Disposition: A | Discharge status: Home / Self Care | Attending: Family Medicine | Admitting: Family Medicine

## 2023-07-01 ENCOUNTER — Emergency Department (HOSPITAL_COMMUNITY)

## 2023-07-01 ENCOUNTER — Encounter (HOSPITAL_COMMUNITY): Payer: Self-pay | Admitting: Internal Medicine

## 2023-07-01 ENCOUNTER — Other Ambulatory Visit: Payer: Self-pay

## 2023-07-01 DIAGNOSIS — G4733 Obstructive sleep apnea (adult) (pediatric): Secondary | ICD-10-CM | POA: Diagnosis not present

## 2023-07-01 DIAGNOSIS — E039 Hypothyroidism, unspecified: Secondary | ICD-10-CM | POA: Diagnosis not present

## 2023-07-01 DIAGNOSIS — R0602 Shortness of breath: Secondary | ICD-10-CM | POA: Diagnosis not present

## 2023-07-01 DIAGNOSIS — E785 Hyperlipidemia, unspecified: Secondary | ICD-10-CM | POA: Diagnosis not present

## 2023-07-01 DIAGNOSIS — D696 Thrombocytopenia, unspecified: Secondary | ICD-10-CM | POA: Diagnosis not present

## 2023-07-01 DIAGNOSIS — I739 Peripheral vascular disease, unspecified: Secondary | ICD-10-CM | POA: Diagnosis present

## 2023-07-01 DIAGNOSIS — J441 Chronic obstructive pulmonary disease with (acute) exacerbation: Principal | ICD-10-CM

## 2023-07-01 DIAGNOSIS — Z87891 Personal history of nicotine dependence: Secondary | ICD-10-CM | POA: Insufficient documentation

## 2023-07-01 DIAGNOSIS — I10 Essential (primary) hypertension: Secondary | ICD-10-CM | POA: Diagnosis not present

## 2023-07-01 DIAGNOSIS — Z794 Long term (current) use of insulin: Secondary | ICD-10-CM | POA: Insufficient documentation

## 2023-07-01 DIAGNOSIS — R9431 Abnormal electrocardiogram [ECG] [EKG]: Secondary | ICD-10-CM | POA: Diagnosis present

## 2023-07-01 DIAGNOSIS — Z7982 Long term (current) use of aspirin: Secondary | ICD-10-CM | POA: Insufficient documentation

## 2023-07-01 DIAGNOSIS — J449 Chronic obstructive pulmonary disease, unspecified: Secondary | ICD-10-CM | POA: Diagnosis present

## 2023-07-01 DIAGNOSIS — E1169 Type 2 diabetes mellitus with other specified complication: Secondary | ICD-10-CM | POA: Diagnosis not present

## 2023-07-01 DIAGNOSIS — E119 Type 2 diabetes mellitus without complications: Secondary | ICD-10-CM

## 2023-07-01 DIAGNOSIS — Z79899 Other long term (current) drug therapy: Secondary | ICD-10-CM | POA: Diagnosis not present

## 2023-07-01 DIAGNOSIS — I2510 Atherosclerotic heart disease of native coronary artery without angina pectoris: Secondary | ICD-10-CM | POA: Diagnosis present

## 2023-07-01 DIAGNOSIS — C3411 Malignant neoplasm of upper lobe, right bronchus or lung: Secondary | ICD-10-CM | POA: Diagnosis not present

## 2023-07-01 DIAGNOSIS — E1159 Type 2 diabetes mellitus with other circulatory complications: Secondary | ICD-10-CM | POA: Diagnosis present

## 2023-07-01 DIAGNOSIS — J439 Emphysema, unspecified: Secondary | ICD-10-CM | POA: Diagnosis not present

## 2023-07-01 DIAGNOSIS — I152 Hypertension secondary to endocrine disorders: Secondary | ICD-10-CM | POA: Diagnosis present

## 2023-07-01 LAB — BASIC METABOLIC PANEL WITH GFR
Anion gap: 7 (ref 5–15)
BUN: 11 mg/dL (ref 8–23)
CO2: 20 mmol/L — ABNORMAL LOW (ref 22–32)
Calcium: 8 mg/dL — ABNORMAL LOW (ref 8.9–10.3)
Chloride: 108 mmol/L (ref 98–111)
Creatinine, Ser: 0.97 mg/dL (ref 0.44–1.00)
GFR, Estimated: >60 mL/min (ref >60)
Glucose, Bld: 55 mg/dL — ABNORMAL LOW (ref 70–99)
Potassium: 3.7 mmol/L (ref 3.5–5.1)
Sodium: 135 mmol/L (ref 135–145)

## 2023-07-01 LAB — TROPONIN I (HIGH SENSITIVITY)
Troponin I (High Sensitivity): 10 ng/L (ref <18)
Troponin I (High Sensitivity): 9 ng/L (ref <18)

## 2023-07-01 LAB — CBC
HCT: 38.8 % (ref 36.0–46.0)
Hemoglobin: 12.3 g/dL (ref 12.0–15.0)
MCH: 26.7 pg (ref 26.0–34.0)
MCHC: 31.7 g/dL (ref 30.0–36.0)
MCV: 84.2 fL (ref 80.0–100.0)
Platelets: 111 10*3/uL — ABNORMAL LOW (ref 150–400)
RBC: 4.61 MIL/uL (ref 3.87–5.11)
RDW: 16.3 % — ABNORMAL HIGH (ref 11.5–15.5)
WBC: 7.1 10*3/uL (ref 4.0–10.5)
nRBC: 0 % (ref 0.0–0.2)

## 2023-07-01 LAB — CBG MONITORING, ED: Glucose-Capillary: 83 mg/dL (ref 70–99)

## 2023-07-01 LAB — BLOOD GAS, VENOUS
Acid-base deficit: 1.3 mmol/L (ref 0.0–2.0)
Bicarbonate: 23.7 mmol/L (ref 20.0–28.0)
O2 Saturation: 90.1 %
Patient temperature: 37
pCO2, Ven: 40 mmHg — ABNORMAL LOW (ref 44–60)
pH, Ven: 7.38 (ref 7.25–7.43)
pO2, Ven: 54 mmHg — ABNORMAL HIGH (ref 32–45)

## 2023-07-01 LAB — MAGNESIUM: Magnesium: 1.8 mg/dL (ref 1.7–2.4)

## 2023-07-01 LAB — GLUCOSE, CAPILLARY: Glucose-Capillary: 212 mg/dL — ABNORMAL HIGH (ref 70–99)

## 2023-07-01 LAB — BRAIN NATRIURETIC PEPTIDE: B Natriuretic Peptide: 49 pg/mL (ref 0.0–100.0)

## 2023-07-01 MED ORDER — ENOXAPARIN SODIUM 40 MG/0.4ML IJ SOSY
40.0000 mg | PREFILLED_SYRINGE | INTRAMUSCULAR | Status: DC
  Administered 2023-07-01: 40 mg via SUBCUTANEOUS
  Filled 2023-07-01: qty 0.4

## 2023-07-01 MED ORDER — LEVOTHYROXINE SODIUM 100 MCG PO TABS
100.0000 ug | ORAL_TABLET | Freq: Every day | ORAL | Status: DC
  Administered 2023-07-02: 100 ug via ORAL
  Filled 2023-07-01: qty 1

## 2023-07-01 MED ORDER — GUAIFENESIN ER 600 MG PO TB12
600.0000 mg | ORAL_TABLET | Freq: Two times a day (BID) | ORAL | Status: DC
Start: 2023-07-01 — End: 2023-07-02
  Administered 2023-07-01 – 2023-07-02 (×2): 600 mg via ORAL
  Filled 2023-07-01 (×2): qty 1

## 2023-07-01 MED ORDER — INSULIN ASPART 100 UNIT/ML IJ SOLN
0.0000 [IU] | Freq: Three times a day (TID) | INTRAMUSCULAR | Status: DC
  Administered 2023-07-02: 9 [IU] via SUBCUTANEOUS
  Administered 2023-07-02: 7 [IU] via SUBCUTANEOUS
  Filled 2023-07-01: qty 0.09

## 2023-07-01 MED ORDER — SODIUM CHLORIDE 0.9% FLUSH
3.0000 mL | Freq: Two times a day (BID) | INTRAVENOUS | Status: DC
  Administered 2023-07-01 – 2023-07-02 (×2): 3 mL via INTRAVENOUS

## 2023-07-01 MED ORDER — METHOCARBAMOL 500 MG PO TABS
500.0000 mg | ORAL_TABLET | Freq: Three times a day (TID) | ORAL | Status: DC | PRN
  Administered 2023-07-01: 500 mg via ORAL
  Filled 2023-07-01: qty 1

## 2023-07-01 MED ORDER — IPRATROPIUM-ALBUTEROL 0.5-2.5 (3) MG/3ML IN SOLN: 3.0000 mL | Freq: Four times a day (QID) | RESPIRATORY_TRACT | Status: DC | PRN

## 2023-07-01 MED ORDER — ASPIRIN 81 MG PO TBEC
81.0000 mg | DELAYED_RELEASE_TABLET | Freq: Every day | ORAL | Status: DC
  Administered 2023-07-02: 81 mg via ORAL
  Filled 2023-07-01: qty 1

## 2023-07-01 MED ORDER — IPRATROPIUM-ALBUTEROL 0.5-2.5 (3) MG/3ML IN SOLN
9.0000 mL | Freq: Once | RESPIRATORY_TRACT | Status: AC
  Administered 2023-07-01: 9 mL via RESPIRATORY_TRACT
  Filled 2023-07-01: qty 3

## 2023-07-01 MED ORDER — DEXTROSE 50 % IV SOLN
1.0000 | Freq: Once | INTRAVENOUS | Status: DC
  Filled 2023-07-01: qty 50

## 2023-07-01 MED ORDER — AMLODIPINE BESYLATE 5 MG PO TABS
10.0000 mg | ORAL_TABLET | Freq: Every day | ORAL | Status: DC
  Administered 2023-07-02: 10 mg via ORAL
  Filled 2023-07-01: qty 2

## 2023-07-01 MED ORDER — BUDESONIDE 0.25 MG/2ML IN SUSP
0.2500 mg | Freq: Two times a day (BID) | RESPIRATORY_TRACT | Status: DC
  Administered 2023-07-01 – 2023-07-02 (×2): 0.25 mg via RESPIRATORY_TRACT
  Filled 2023-07-01 (×2): qty 2

## 2023-07-01 MED ORDER — METHENAMINE MANDELATE 0.5 G PO TABS
1.0000 g | ORAL_TABLET | Freq: Two times a day (BID) | ORAL | Status: DC
  Administered 2023-07-02: 1 g via ORAL
  Filled 2023-07-01 (×2): qty 2

## 2023-07-01 MED ORDER — METHYLPREDNISOLONE SODIUM SUCC 125 MG IJ SOLR
81.2500 mg | Freq: Every day | INTRAMUSCULAR | Status: DC
  Administered 2023-07-01 – 2023-07-02 (×2): 81.25 mg via INTRAVENOUS
  Filled 2023-07-01 (×2): qty 2

## 2023-07-01 MED ORDER — SPIRONOLACTONE 25 MG PO TABS
25.0000 mg | ORAL_TABLET | Freq: Every day | ORAL | Status: DC
  Administered 2023-07-02: 25 mg via ORAL
  Filled 2023-07-01: qty 1

## 2023-07-01 MED ORDER — ACETAMINOPHEN 325 MG PO TABS: 650.0000 mg | ORAL_TABLET | Freq: Four times a day (QID) | ORAL | Status: DC | PRN

## 2023-07-01 MED ORDER — SENNOSIDES-DOCUSATE SODIUM 8.6-50 MG PO TABS: 1.0000 | ORAL_TABLET | Freq: Every evening | ORAL | Status: DC | PRN

## 2023-07-01 MED ORDER — ATENOLOL 50 MG PO TABS
100.0000 mg | ORAL_TABLET | Freq: Every day | ORAL | Status: DC
  Administered 2023-07-02: 100 mg via ORAL
  Filled 2023-07-01: qty 2

## 2023-07-01 MED ORDER — MAGNESIUM SULFATE 2 GM/50ML IV SOLN
2.0000 g | Freq: Once | INTRAVENOUS | Status: AC
  Administered 2023-07-01: 2 g via INTRAVENOUS
  Filled 2023-07-01: qty 50

## 2023-07-01 MED ORDER — ALBUTEROL SULFATE (2.5 MG/3ML) 0.083% IN NEBU
10.0000 mg | INHALATION_SOLUTION | Freq: Once | RESPIRATORY_TRACT | Status: AC
  Administered 2023-07-01: 10 mg via RESPIRATORY_TRACT
  Filled 2023-07-01: qty 12

## 2023-07-01 MED ORDER — METHYLPREDNISOLONE SODIUM SUCC 125 MG IJ SOLR
125.0000 mg | Freq: Once | INTRAMUSCULAR | Status: AC
  Administered 2023-07-01: 125 mg via INTRAVENOUS
  Filled 2023-07-01: qty 2

## 2023-07-01 MED ORDER — ACETAMINOPHEN 650 MG RE SUPP: 650.0000 mg | Freq: Four times a day (QID) | RECTAL | Status: DC | PRN

## 2023-07-01 MED ORDER — ROSUVASTATIN CALCIUM 20 MG PO TABS
40.0000 mg | ORAL_TABLET | Freq: Every day | ORAL | Status: DC
  Administered 2023-07-02: 40 mg via ORAL
  Filled 2023-07-01: qty 2

## 2023-07-01 MED ORDER — ARFORMOTEROL TARTRATE 15 MCG/2ML IN NEBU
15.0000 ug | INHALATION_SOLUTION | Freq: Two times a day (BID) | RESPIRATORY_TRACT | Status: DC
  Administered 2023-07-01 – 2023-07-02 (×2): 15 ug via RESPIRATORY_TRACT
  Filled 2023-07-01 (×2): qty 2
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
## 2023-07-01 NOTE — Hospital Course (Signed)
 [name] is a 68 y.o. female with medical history significant for COPD/emphysema, suspected sarcoidosis, recently diagnosed stage I RUL squamous cell lung cancer s/p SBRT, CAD, PAD, T2DM, HTN, HLD, hypothyroidism, OSA not using CPAP who is admitted with acute COPD exacerbation.   Assessment and Plan:  COPD with acute exacerbation:  Significant wheezing throughout the lung fields on admission.   -CXR negative for acute changes.   -Currently saturating well on room air.  - Follows with pulmonology, has mixed obstruction and restriction on PFTs as well as previous suspicion of underlying sarcoidosis.  - IV Solu-Medrol  40 mg twice daily - Brovana/Pulmicort twice daily, DuoNebs as needed - Supplemental oxygen as needed - IS, FV, Mucinex    Stage I RUL squamous cell lung cancer: Follows with Rad/Onc Dr. Lurena Sally.  Recently completed SBRT to right upper lung.   Thrombocytopenia: New mild thrombocytopenia without obvious bleeding.  Continue to monitor.   Type 2 diabetes: Low serum glucose on arrival, CBG improved after receiving steroids.  Hemoglobin A1c 7.7% on 06/17/2023.  Will place on SSI, adjust as needed.   Hypertension: Continue amlodipine , atenolol , spironolactone .   Prolonged QTc: Limit QT prolonging meds.   CAD/PAD/HLD: Continue aspirin  and rosuvastatin .   Hypothyroidism: Continue Synthroid .   History of nephrolithiasis and recurrent UTI: Continue methenamine  for UTI prophylaxis.   OSA: Patient reports intolerance to CPAP.  Continue supplemental oxygen at night as needed.

## 2023-07-01 NOTE — ED Provider Notes (Addendum)
 [place] EMERGENCY DEPARTMENT AT [facility] Provider Note   CSN: [id number] Arrival date & time: 07/01/23  1434     History  Chief Complaint  Patient presents with   Shortness of Breath    [name] is a 68 y.o. female history of diabetes, stroke, SBO, ARF, peripheral artery disease, lung cancer currently initial chemo on Monday, COPD presented for shortness of breath began last night.  Patient states he has a dry cough but denies any chest pain.  Patient denies any hemoptysis or leg swelling.  Patient denies any fevers.  Patient states she does not take anything for her COPD as she was not aware that she had COPD.  Home Medications Prior to Admission medications   Medication Sig Start Date End Date Taking? Authorizing Provider  ACCU-CHEK GUIDE test strip  06/25/19   [provider]  Accu-Chek Softclix Lancets lancets  06/25/19   [provider]  albuterol  (PROVENTIL ) (2.5 MG/3ML) 0.083% nebulizer solution Take 3 mLs (2.5 mg total) by nebulization every 2 (two) hours as needed for wheezing or shortness of breath. 06/18/23   Lorita Rosa, MD  albuterol  (VENTOLIN  HFA) 108 (90 Base) MCG/ACT inhaler Inhale 2 puffs into the lungs every 6 (six) hours as needed for wheezing or shortness of breath. 06/05/23   Byrum, Robert S, MD  amLODipine  (NORVASC ) 10 MG tablet Take 10 mg by mouth in the morning.    [provider]  aspirin  EC 81 MG tablet Take 1 tablet (81 mg total) by mouth daily. Swallow whole. 10/13/20   Dunn, Dayna N, PA-C  atenolol  (TENORMIN ) 100 MG tablet Take 1 tablet (100 mg total) by mouth daily. 12/18/22   Loyde Rule, MD  B-D UF III MINI PEN NEEDLES 31G X 5 MM MISC  07/22/20   [provider]  Blood Glucose Monitoring Suppl (ACCU-CHEK GUIDE) w/Device KIT  06/29/19   [provider]  Cholecalciferol (VITAMIN D3) 125 MCG (5000 UT) TABS Take 5,000 Units by mouth in the morning.    [provider]  cilostazol   (PLETAL ) 100 MG tablet Take 1 tablet (100 mg total) by mouth 2 (two) times daily. 04/16/23   Loyde Rule, MD  dicyclomine  (BENTYL ) 10 MG capsule Take 10 mg by mouth 3 (three) times daily before meals.    [provider]  estradiol  (ESTRACE ) 0.1 MG/GM vaginal cream Apply 3 times weekly by using a pea-sized amount applied to fingertip 04/23/23   Scarlet Curly, MD  Evolocumab  (REPATHA  SURECLICK) 140 MG/ML SOAJ INJECT 1 PEN INTO THE SKIN EVERY 14 (FOURTEEN) DAYS. 03/29/23   Wenona Hamilton, MD  guaiFENesin  (MUCINEX ) 600 MG 12 hr tablet Take 1 tablet (600 mg total) by mouth 2 (two) times daily. 06/18/23   Lorita Rosa, MD  guaiFENesin -codeine  100-10 MG/5ML syrup Take 7.5 mLs by mouth every 4 (four) hours as needed for cough. 06/21/23   Colie Dawes, MD  insulin  aspart protamine  - aspart (NOVOLOG  70/30 MIX) (70-30) 100 UNIT/ML FlexPen Inject 40-60 Units into the skin in the morning and at bedtime. Inject 60 units in the am & 40 units in the pm 05/16/16   [provider]  levothyroxine  (SYNTHROID ) 100 MCG tablet Take 100 mcg by mouth daily before breakfast.    [provider]  methenamine  (HIPREX ) 1 g tablet Take 1 tablet (1 g total) by mouth 2 (two) times daily with a meal. Take along with a vit C tablet as directed 04/23/23   Del Favia,  Scarlette Currier, MD  rosuvastatin  (CRESTOR ) 40 MG tablet Take 1 tablet (40 mg total) by mouth daily. 06/23/20 05/14/23  Pokhrel, Laxman, MD  spironolactone  (ALDACTONE ) 25 MG tablet Take 1 tablet (25 mg total) by mouth daily. Patient taking differently: Take 25 mg by mouth 2 (two) times daily. 04/03/23   Singh, Prashant K, MD      Allergies    Strawberry extract, Cefdinir, Oxycodone  hcl, Sulfamethoxazole-trimethoprim, Topiramate , Cymbalta [duloxetine hcl], Lisinopril , Lunesta [eszopiclone], Metformin  and related, Other, Sulfa antibiotics, Tramadol, Trazodone and nefazodone, and Penicillins    Review of Systems   Review of Systems  Respiratory:   Positive for shortness of breath.     Physical Exam Updated Vital Signs BP (!) 139/59 (BP Location: Left Arm)   Pulse 80   Temp 98.2 F (36.8 C) (Oral)   Resp (!) 22   SpO2 96%  Physical Exam Constitutional:      General: She is not in acute distress. Cardiovascular:     Rate and Rhythm: Normal rate and regular rhythm.     Pulses: Normal pulses.     Heart sounds: Normal heart sounds.  Pulmonary:     Effort: No respiratory distress.     Breath sounds: Decreased breath sounds and wheezing present.     Comments: Able to speak in full sentences Increased work of breathing Musculoskeletal:     Right lower leg: No tenderness. No edema.     Left lower leg: No tenderness. No edema.  Skin:    General: Skin is warm and dry.  Neurological:     Mental Status: She is alert.  Psychiatric:        Mood and Affect: Mood normal.     ED Results / Procedures / Treatments   Labs (all labs ordered are listed, but only abnormal results are displayed) Labs Reviewed  BASIC METABOLIC PANEL WITH GFR - Abnormal; Notable for the following components:      Result Value   CO2 20 (*)    Glucose, Bld 55 (*)    Calcium  8.0 (*)    All other components within normal limits  CBC - Abnormal; Notable for the following components:   RDW 16.3 (*)    Platelets 111 (*)    All other components within normal limits  BLOOD GAS, VENOUS - Abnormal; Notable for the following components:   pCO2, Ven 40 (*)    pO2, Ven 54 (*)    All other components within normal limits  BRAIN NATRIURETIC PEPTIDE  MAGNESIUM   CBG MONITORING, ED  TROPONIN I (HIGH SENSITIVITY)  TROPONIN I (HIGH SENSITIVITY)    EKG EKG Interpretation Date/Time:  Monday Jul 01 2023 14:42:36 EDT Ventricular Rate:  80 PR Interval:  143 QRS Duration:  94 QT Interval:  509 QTC Calculation: 588 R Axis:   52  Text Interpretation: Sinus rhythm Consider left atrial enlargement Prolonged QT interval No significant change since last tracing  Confirmed by Trish Furl [phone]) on 07/01/2023 3:07:24 PM  Radiology DG Chest 2 View Result Date: 07/01/2023 CLINICAL DATA:  Shortness of breath. EXAM: CHEST - 2 VIEW COMPARISON:  Radiograph and CT 06/16/2023 FINDINGS: Stable heart size and mediastinal contours. Coarse lung markings with emphysema on prior CT. Right apical nodule is not well seen on the current exam. Additional areas of ill-defined bilateral lung opacities correspond to ground-glass on CT and are chronic. No definite acute airspace disease. No pleural fluid or pneumothorax. No acute osseous findings. IMPRESSION: Emphysema and chronic lung disease. No definite  acute findings. Electronically Signed   By: Chadwick Colonel M.D.   On: 07/01/2023 16:37    Procedures .Critical Care  Performed by: Denese Finn, PA-C Authorized by: Denese Finn, PA-C   Critical care provider statement:    Critical care time (minutes):  40   Critical care time was exclusive of:  Separately billable procedures and treating other patients   Critical care was necessary to treat or prevent imminent or life-threatening deterioration of the following conditions:  Respiratory failure   Critical care was time spent personally by me on the following activities:  Development of treatment plan with patient or surrogate, blood draw for specimens, discussions with consultants, evaluation of patient's response to treatment, examination of patient, obtaining history from patient or surrogate, review of old charts, re-evaluation of patient's condition, pulse oximetry, ordering and review of radiographic studies, ordering and review of laboratory studies and ordering and performing treatments and interventions   I assumed direction of critical care for this patient from another provider in my specialty: no     Care discussed with: admitting provider       Medications Ordered in ED Medications  dextrose  50 % solution 50 mL (0 mLs Intravenous Hold 07/01/23 1656)   ipratropium-albuterol  (DUONEB) 0.5-2.5 (3) MG/3ML nebulizer solution 9 mL (9 mLs Nebulization Given 07/01/23 1545)  methylPREDNISolone  sodium succinate (SOLU-MEDROL ) 125 mg/2 mL injection 125 mg (125 mg Intravenous Given 07/01/23 1551)  magnesium  sulfate IVPB 2 g 50 mL (0 g Intravenous Stopped 07/01/23 1724)  albuterol  (PROVENTIL ) (2.5 MG/3ML) 0.083% nebulizer solution 10 mg (10 mg Nebulization Given 07/01/23 1813)    ED Course/ Medical Decision Making/ A&P                                 Medical Decision Making Amount and/or Complexity of Data Reviewed Labs: ordered. Radiology: ordered.  Risk Prescription drug management. Decision regarding hospitalization.   John Muzzy 68 y.o. presented today for shortness of breath.  Working DDx that I considered at this time includes, but not limited to, asthma/COPD exacerbation, URI, viral illness, anemia, ACS, PE, pneumonia, pleural effusion, lung cancer, CHF, respiratory distress, medication side effect, intoxication.  R/o DDx: Asthma exacerbation, URI, viral illness, anemia, ACS, PE, pneumonia, pleural effusion, lung cancer, CHF, respiratory distress, medication side effect, intoxication: These are considered less likely due to history of present illness, physical exam, labs/imaging findings  Review of prior external notes: 06/08/2023 unknown  Unique Tests and My Independent Interpretation:  CBC: Unremarkable BMP: Unremarkable Magnesium : 1.8 VBG: Unremarkable EKG: Sinus 80 bpm, prolonged QT noted, no ST elevations, no right heart strain noted Troponin: 10, 9 CXR: Emphysema noted BNP: Unremarkable  Social Determinants of Health: none  Discussion with Independent Historian: Family  Discussion of Management of Tests: Patel, MD Hospitalist  Risk: High: hospitalization or escalation of hospital-level care  Risk Stratification Score: None  Staffed with Dixon, MD  Plan: On exam patient was no acute distress but does have decreased  breath sounds and wheezing bilaterally and tachypneic. The cardiac monitor was ordered secondary to the patient's history of shortness of breath and to monitor the patient for dysrhythmia. Cardiac monitor by my independent interpretation showed normal sinus.  Patient has history of COPD however states that she was not aware of this and does not take any breathing treatments or medications for this.  Patient has decreased breath sounds bilateral along with wheezing and a dry  cough suspicious of COPD exacerbation.  Will treat with breathing treatments magnesium  and steroids and reevaluate.  Patient is not endorsing any leg swelling, calf tenderness, hemoptysis and so we will defer PE workup at this time given physical exam and medical history.  On reevaluation by my independent rotation patient's cardiac monitor is still in normal sinus.  Patient states she feels about the same after medications given but does appear somnolent to me along with wheezing still.  Will get VBG.  Labs and imaging are essentially unremarkable.  I went to reevaluate patient and she is still extremely wheezy and short of breath.  Patient's vitals are stable however given that she is a high risk patient and appears extremely uncomfortable we will start continues albuterol  treatment and admit to hospitalist for COPD exacerbation.  I spoke to the hospitalist and they do will come down to admit the patient.  This chart was dictated using voice recognition software.  Despite best efforts to proofread,  errors can occur which can change the documentation meaning.         Final Clinical Impression(s) / ED Diagnoses Final diagnoses:  COPD exacerbation [facility])    Rx / DC Orders ED Discharge Orders     None         Elex Grimmer 07/01/23 1841    Denese Finn, PA-C 07/01/23 1842    Denese Finn, PA-C 07/01/23 1849    Iva Mariner, MD 07/01/23 2320

## 2023-07-01 NOTE — ED Triage Notes (Signed)
 Pt reports shob since yesterday. Just finished chemo. Using home meds with no relief. In hospital earlier this month for same

## 2023-07-02 DIAGNOSIS — J441 Chronic obstructive pulmonary disease with (acute) exacerbation: Secondary | ICD-10-CM | POA: Diagnosis not present

## 2023-07-02 LAB — CBC
HCT: 38.4 % (ref 36.0–46.0)
Hemoglobin: 12.3 g/dL (ref 12.0–15.0)
MCH: 27.1 pg (ref 26.0–34.0)
MCHC: 32 g/dL (ref 30.0–36.0)
MCV: 84.6 fL (ref 80.0–100.0)
Platelets: 163 10*3/uL (ref 150–400)
RBC: 4.54 MIL/uL (ref 3.87–5.11)
RDW: 16.2 % — ABNORMAL HIGH (ref 11.5–15.5)
WBC: 5.5 10*3/uL (ref 4.0–10.5)
nRBC: 0 % (ref 0.0–0.2)

## 2023-07-02 LAB — BASIC METABOLIC PANEL WITH GFR
Anion gap: 12 (ref 5–15)
BUN: 13 mg/dL (ref 8–23)
CO2: 19 mmol/L — ABNORMAL LOW (ref 22–32)
Calcium: 9.3 mg/dL (ref 8.9–10.3)
Chloride: 103 mmol/L (ref 98–111)
Creatinine, Ser: 0.97 mg/dL (ref 0.44–1.00)
GFR, Estimated: >60 mL/min (ref >60)
Glucose, Bld: 327 mg/dL — ABNORMAL HIGH (ref 70–99)
Potassium: 5.4 mmol/L — ABNORMAL HIGH (ref 3.5–5.1)
Sodium: 134 mmol/L — ABNORMAL LOW (ref 135–145)

## 2023-07-02 LAB — GLUCOSE, CAPILLARY
Glucose-Capillary: 337 mg/dL — ABNORMAL HIGH (ref 70–99)
Glucose-Capillary: 383 mg/dL — ABNORMAL HIGH (ref 70–99)

## 2023-07-02 MED ORDER — IPRATROPIUM-ALBUTEROL 0.5-2.5 (3) MG/3ML IN SOLN
3.0000 mL | Freq: Four times a day (QID) | RESPIRATORY_TRACT | Status: DC
  Administered 2023-07-02 (×2): 3 mL via RESPIRATORY_TRACT
  Filled 2023-07-02 (×2): qty 3

## 2023-07-02 MED ORDER — METHYLPREDNISOLONE 4 MG PO TBPK: ORAL_TABLET | ORAL | 0 refills | Status: DC

## 2023-07-02 MED ORDER — GUAIFENESIN ER 600 MG PO TB12: 600.0000 mg | ORAL_TABLET | Freq: Two times a day (BID) | ORAL | 0 refills | Status: DC

## 2023-07-02 NOTE — Plan of Care (Signed)

## 2023-07-02 NOTE — Progress Notes (Signed)
   07/02/23 0935  TOC Brief Assessment  Insurance and Status Reviewed  Patient has primary care physician Yes  Home environment has been reviewed single family home  Prior level of function: independent  Prior/Current Home Services No current home services  Social Drivers of Health Review SDOH reviewed no interventions necessary  Readmission risk has been reviewed Yes  Transition of care needs no transition of care needs at this time    [place], MSW, LCSW 07/02/2023 9:35 AM

## 2023-07-02 NOTE — Discharge Summary (Addendum)
 Physician Discharge Summary   Patient: [name] MRN: [id number] DOB: [birth year]  Admit date:     07/01/2023  Discharge date: 07/02/23  Discharge Physician: Bobbetta Burnet   PCP: Olin Bertin, MD   Recommendations at discharge:   Follow-up with primary pulmonologist in 1 week Follow with the PCP in 1-2 weeks Continue Medrol  dose pack taper, continue breathing treatment every 4-6 hours at home   Discharge Diagnoses: Principal Problem:   COPD with acute exacerbation (HCC) Active Problems:   Cancer of upper lobe of right lung (HCC)   Thrombocytopenia (HCC)   Insulin  dependent type 2 diabetes mellitus (HCC)   Hypertension associated with diabetes (HCC)   Hyperlipidemia associated with type 2 diabetes mellitus (HCC)   CAD (coronary artery disease)   Peripheral artery disease (HCC)   Hypothyroidism   Obstructive sleep apnea   History of prolonged Q-T interval on ECG  Resolved Problems:   * No resolved hospital problems. [facility] Course: [name] is a 68 y.o. female with medical history significant for COPD/emphysema, suspected sarcoidosis, recently diagnosed stage I RUL squamous cell lung cancer s/p SBRT, CAD, PAD, T2DM, HTN, HLD, hypothyroidism, OSA not using CPAP who is admitted with acute COPD exacerbation.     COPD with acute exacerbation: Patient and his significant shortness of breath and wheezing on admission--- per patient has improved Has received 125 mg of IV Solu-Medrol , every 4-6 hours DuoNeb broncho dilator treatment - Ackley satting 92-99% on room air -CXR negative for acute changes.    - Follows with pulmonology, has mixed obstruction and restriction on PFTs as well as previous suspicion of underlying sarcoidosis.  - Brovana/Pulmicort twice daily, DuoNebs as needed - Supplemental oxygen as needed Will discharge the patient on Medrol  Dosepak, continue bronchodilator and Mucinex  at home  To follow with the primary pulmonologist in 1-2  weeks   Stage I RUL squamous cell lung cancer: Follows with Rad/Onc Dr. Lurena Sally.  Recently completed SBRT to right upper lung.   Thrombocytopenia: New mild thrombocytopenia without obvious bleeding.  Continue to monitor.   Type 2 diabetes: Patient was instructed and notified that her blood sugar will be running high while on tapered down steroids -Maintain strict diabetic diet  Low serum glucose on arrival, CBG improved after receiving steroids.   Hemoglobin A1c 7.7% on 06/17/2023.    Hypertension: Continue amlodipine , atenolol , spironolactone  (hold the spironolactone  for few days due to mildly elevated potassium)   Prolonged QTc: Limit QT prolonging meds.   CAD/PAD/HLD: Continue aspirin  and rosuvastatin .   Hypothyroidism: Continue Synthroid .   History of nephrolithiasis and recurrent UTI: Continue methenamine  for UTI prophylaxis.   OSA: Patient reports intolerance to CPAP.  Continue supplemental oxygen at night as needed.    Disposition: Home Diet recommendation:  Discharge Diet Orders (From admission, onward)     Start     Ordered   07/02/23 0000  Diet - low sodium heart healthy        07/02/23 1226           Carb modified diet DISCHARGE MEDICATION: Allergies as of 07/02/2023       Reactions   Metformin  And Related Other (See Comments)   Damaged patient's kidneys   Strawberry Extract Anaphylaxis   Cefdinir Other (See Comments)   Reaction not named   Oxycodone  Hcl Nausea And Vomiting   Sulfamethoxazole-trimethoprim Rash   Topiramate  Other (See Comments)   GI Upset, Dizzy, HA, Blurred Vision   Cymbalta [duloxetine Hcl] Other (See  Comments)   Dizzy   Hydrocodone  Bit-homatrop Mbr Nausea And Vomiting   Lisinopril  Itching, Swelling, Other (See Comments)   Angioedema   Lunesta [eszopiclone] Other (See Comments)   Bad taste in mouth    Other Other (See Comments)   Pt is a Jehovah Witness. No blood products.   Sulfa Antibiotics Itching, Swelling, Other  (See Comments)   Angioedema   Tramadol Other (See Comments)   Hallucinations   Trazodone And Nefazodone Other (See Comments)   Shakes   Penicillins Rash, Other (See Comments)   "Paralyzed" (per patient) Has patient had a PCN reaction causing immediate rash, facial/tongue/throat swelling, SOB or lightheadedness with hypotension: Yes Has patient had a PCN reaction causing severe rash involving mucus membranes or skin necrosis: No Has patient had a PCN reaction that required hospitalization Yes Has patient had a PCN reaction occurring within the last 10 years: No If all of the above answers are "NO", then may proceed with Cephalosporin use.        Medication List     PAUSE taking these medications    spironolactone  25 MG tablet Wait to take this until: Jul 04, 2023 Commonly known as: ALDACTONE  Take 1 tablet (25 mg total) by mouth daily.       TAKE these medications    Accu-Chek Guide test strip Generic drug: glucose blood   Accu-Chek Guide w/Device Kit   Accu-Chek Softclix Lancets lancets   albuterol  108 (90 Base) MCG/ACT inhaler Commonly known as: VENTOLIN  HFA Inhale 2 puffs into the lungs every 6 (six) hours as needed for wheezing or shortness of breath.   albuterol  (2.5 MG/3ML) 0.083% nebulizer solution Commonly known as: PROVENTIL  Take 3 mLs (2.5 mg total) by nebulization every 2 (two) hours as needed for wheezing or shortness of breath.   amLODipine  10 MG tablet Commonly known as: NORVASC  Take 10 mg by mouth in the morning.   aspirin  EC 81 MG tablet Take 1 tablet (81 mg total) by mouth daily. Swallow whole.   atenolol  100 MG tablet Commonly known as: TENORMIN  Take 1 tablet (100 mg total) by mouth daily.   B-D UF III MINI PEN NEEDLES 31G X 5 MM Misc Generic drug: Insulin  Pen Needle   cilostazol  100 MG tablet Commonly known as: PLETAL  Take 1 tablet (100 mg total) by mouth 2 (two) times daily.   dicyclomine  10 MG capsule Commonly known as: BENTYL  Take  10 mg by mouth 3 (three) times daily before meals.   estradiol  0.1 MG/GM vaginal cream Commonly known as: ESTRACE  Apply 3 times weekly by using a pea-sized amount applied to fingertip What changed:  how to take this when to take this   guaiFENesin  600 MG 12 hr tablet Commonly known as: MUCINEX  Take 1 tablet (600 mg total) by mouth 2 (two) times daily. What changed:  when to take this reasons to take this   guaiFENesin -codeine  100-10 MG/5ML syrup Take 7.5 mLs by mouth every 4 (four) hours as needed for cough.   insulin  aspart protamine  - aspart (70-30) 100 UNIT/ML FlexPen Commonly known as: NOVOLOG  70/30 MIX Inject 40-60 Units into the skin. Inject 60 units into the skin in the morning and 40 units at bedtime   ipratropium-albuterol  0.5-2.5 (3) MG/3ML Soln Commonly known as: DUONEB Take 3 mLs by nebulization every 8 (eight) hours as needed (for shortness of breathing or wheezing).   levothyroxine  100 MCG tablet Commonly known as: SYNTHROID  Take 100 mcg by mouth daily before breakfast.   methenamine  1  g tablet Commonly known as: HIPREX  Take 1 tablet (1 g total) by mouth 2 (two) times daily with a meal. Take along with a vit C tablet as directed What changed: additional instructions   methylPREDNISolone  4 MG Tbpk tablet Commonly known as: MEDROL  DOSEPAK Medrol  Dosepak take as instructed   Mounjaro 12.5 MG/0.5ML Pen Generic drug: tirzepatide Inject 12.5 mg into the skin every Friday.   Repatha  SureClick 140 MG/ML Soaj Generic drug: Evolocumab  INJECT 1 PEN INTO THE SKIN EVERY 14 (FOURTEEN) DAYS.   rosuvastatin  40 MG tablet Commonly known as: CRESTOR  Take 1 tablet (40 mg total) by mouth daily.   Spiriva  Respimat 2.5 MCG/ACT Aers Generic drug: Tiotropium Bromide Monohydrate  Inhale 2 puffs into the lungs daily.   vitamin C 250 MG tablet Commonly known as: ASCORBIC ACID Take 250 mg by mouth See admin instructions. Take 250 mg by mouth twice a day, with each dose of  Methenamine    Vitamin D3 125 MCG (5000 UT) Tabs Take 5,000 Units by mouth in the morning.        Discharge Exam: Filed Weights   07/01/23 2027  Weight: 107.7 kg     General:  AAO x 3,  cooperative, no distress;   HEENT:  Normocephalic, PERRL, otherwise with in Normal limits   Neuro:  CNII-XII intact. , normal motor and sensation, reflexes intact   Lungs:   Clear to auscultation BL, Respirations unlabored,  + wheezes and rails/ NO crackles  Cardio:    S1/S2, RRR, No murmure, No Rubs or Gallops   Abdomen:  Soft, non-tender, bowel sounds active all four quadrants, no guarding or peritoneal signs.  Muscular  skeletal:  Limited exam -global generalized weaknesses - in bed, able to move all 4 extremities,   2+ pulses,  symmetric, No pitting edema  Skin:  Dry, warm to touch, negative for any Rashes,  Wounds: Please see nursing documentation      Condition at discharge: good  The results of significant diagnostics from this hospitalization (including imaging, microbiology, ancillary and laboratory) are listed below for reference.   Imaging Studies: DG Chest 2 View Result Date: 07/01/2023 CLINICAL DATA:  Shortness of breath. EXAM: CHEST - 2 VIEW COMPARISON:  Radiograph and CT 06/16/2023 FINDINGS: Stable heart size and mediastinal contours. Coarse lung markings with emphysema on prior CT. Right apical nodule is not well seen on the current exam. Additional areas of ill-defined bilateral lung opacities correspond to ground-glass on CT and are chronic. No definite acute airspace disease. No pleural fluid or pneumothorax. No acute osseous findings. IMPRESSION: Emphysema and chronic lung disease. No definite acute findings. Electronically Signed   By: Chadwick Colonel M.D.   On: 07/01/2023 16:37   CT Angio Chest PE W and/or Wo Contrast Result Date: 06/16/2023 CLINICAL DATA:  Pulmonary embolism (PE) suspected, high prob Wheezing and shortness of breath. Radiologic records indicates history  of non-small cell lung cancer. EXAM: CT ANGIOGRAPHY CHEST WITH CONTRAST TECHNIQUE: Multidetector CT imaging of the chest was performed using the standard protocol during bolus administration of intravenous contrast. Multiplanar CT image reconstructions and MIPs were obtained to evaluate the vascular anatomy. RADIATION DOSE REDUCTION: This exam was performed according to the departmental dose-optimization program which includes automated exposure control, adjustment of the mA and/or kV according to patient size and/or use of iterative reconstruction technique. CONTRAST:  75mL OMNIPAQUE  IOHEXOL  350 MG/ML SOLN COMPARISON:  Radiograph earlier today. PET CT 03/30/2023. Chest CT 04/05/2023 FINDINGS: Cardiovascular: There are no filling defects within the pulmonary  arteries to suggest pulmonary embolus. The main pulmonary artery is dilated at 3.8 cm. Diffuse aortic atherosclerosis without acute aortic findings. Mitral annulus and coronary artery calcifications. The heart is upper normal in size. No pericardial effusion. Mediastinum/Nodes: Bilateral hilar adenopathy, likely increased although comparison with priors is difficult in the absence of IV contrast. Suspected paratracheal adenopathy, ill-defined soft tissue spanning 14 mm series 4, image 48. Prominent epicardial nodes are unchanged, series 4, image 101. No esophageal wall thickening. Lungs/Pleura: Right apical nodule measures 2.6 x 2.4 cm, previously 2.2 x 1.7 cm. Subpleural left lower lobe nodule measures 10 x 8 mm, series 12, image 77 unchanged in size from prior PET. Perifissural nodule in the right mid lung, series 12, image 57, unchanged and likely an intrapulmonary lymph node. Diffuse heterogeneous ground-glass opacities, greatest in the right lung, without significant interval change. There is moderate emphysema. Bowing of the trachea may be due to expiratory phase imaging. There is prominent central bronchial thickening and narrowing. Areas of bronchial  occlusion in the subsegmental right lower lobe. No confluent consolidation. No significant pleural effusion. Upper Abdomen: No acute upper abdominal findings. Musculoskeletal: There are no acute or suspicious osseous abnormalities. Scattered thoracic spondylosis. No acute chest wall soft tissue abnormalities. Review of the MIP images confirms the above findings. IMPRESSION: 1. No pulmonary embolus. 2. Enlarging right upper lobe pulmonary nodule likely site of primary malignancy. Left lower lobe pulmonary nodule is stable. 3. Diffuse heterogeneous ground-glass opacities, greatest in the right lung, without significant interval change. This is chronic and not significantly changed from prior imaging. 4. Bilateral hilar adenopathy, likely increased although comparison with priors is difficult in the absence of IV contrast. Suspected paratracheal adenopathy, ill-defined soft tissue spanning 14 mm. Attention at oncologic follow-up recommended. 5. Dilated main pulmonary artery, can be seen with pulmonary arterial hypertension. 6. Aortic atherosclerosis and coronary artery calcifications. Aortic Atherosclerosis (ICD10-I70.0) and Emphysema (ICD10-J43.9). Electronically Signed   By: Chadwick Colonel M.D.   On: 06/16/2023 22:06   DG Chest 2 View Result Date: 06/16/2023 CLINICAL DATA:  shob EXAM: CHEST - 2 VIEW COMPARISON:  Chest x-ray 05/14/2023 trauma PET CT 05/28/2023 FINDINGS: The heart and mediastinal contours are within normal limits. Persistent patchy airspace and interstitial opacities. No pleural effusion. No pneumothorax. No acute osseous abnormality. IMPRESSION: Persistent patchy airspace and interstitial opacities. Electronically Signed   By: Morgane  Naveau M.D.   On: 06/16/2023 20:12    Microbiology: Results for orders placed or performed during the hospital encounter of 06/16/23  Respiratory (~20 pathogens) panel by PCR     Status: None   Collection Time: 06/17/23 12:36 AM   Specimen: Nasopharyngeal  Swab; Respiratory  Result Value Ref Range Status   Adenovirus NOT DETECTED NOT DETECTED Final   Coronavirus 229E NOT DETECTED NOT DETECTED Final    Comment: (NOTE) The Coronavirus on the Respiratory Panel, DOES NOT test for the novel  Coronavirus (2019 nCoV)    Coronavirus HKU1 NOT DETECTED NOT DETECTED Final   Coronavirus NL63 NOT DETECTED NOT DETECTED Final   Coronavirus OC43 NOT DETECTED NOT DETECTED Final   Metapneumovirus NOT DETECTED NOT DETECTED Final   Rhinovirus / Enterovirus NOT DETECTED NOT DETECTED Final   Influenza A NOT DETECTED NOT DETECTED Final   Influenza B NOT DETECTED NOT DETECTED Final   Parainfluenza Virus 1 NOT DETECTED NOT DETECTED Final   Parainfluenza Virus 2 NOT DETECTED NOT DETECTED Final   Parainfluenza Virus 3 NOT DETECTED NOT DETECTED Final   Parainfluenza Virus 4  NOT DETECTED NOT DETECTED Final   Respiratory Syncytial Virus NOT DETECTED NOT DETECTED Final   Bordetella pertussis NOT DETECTED NOT DETECTED Final   Bordetella Parapertussis NOT DETECTED NOT DETECTED Final   Chlamydophila pneumoniae NOT DETECTED NOT DETECTED Final   Mycoplasma pneumoniae NOT DETECTED NOT DETECTED Final    Comment: Performed at [facility], [address] [place], [place] [address]  MRSA Next Gen by PCR, Nasal     Status: None   Collection Time: 06/17/23 10:25 AM   Specimen: Nasal Mucosa; Nasal Swab  Result Value Ref Range Status   MRSA by PCR Next Gen NOT DETECTED NOT DETECTED Final    Comment: (NOTE) The GeneXpert MRSA Assay (FDA approved for NASAL specimens only), is one component of a comprehensive MRSA colonization surveillance program. It is not intended to diagnose MRSA infection nor to guide or monitor treatment for MRSA infections. Test performance is not FDA approved in patients less than 29 years old. Performed at [facility], [address], [place], [place] [address]     Labs: CBC: Recent Labs  Lab 07/01/23 1535  07/02/23 0503  WBC 7.1 5.5  HGB 12.3 12.3  HCT 38.8 38.4  MCV 84.2 84.6  PLT 111* 163   Basic Metabolic Panel: Recent Labs  Lab 07/01/23 1535 07/02/23 0503  NA 135 134*  K 3.7 5.4*  CL 108 103  CO2 20* 19*  GLUCOSE 55* 327*  BUN 11 13  CREATININE 0.97 0.97  CALCIUM  8.0* 9.3  MG 1.8  --    Liver Function Tests: No results for input(s): "AST", "ALT", "ALKPHOS", "BILITOT", "PROT", "ALBUMIN " in the last 168 hours. CBG: Recent Labs  Lab 07/01/23 1707 07/01/23 2030 07/02/23 0715 07/02/23 1113  GLUCAP 83 212* 337* 383*    Discharge time spent: greater than 30 minutes.  Signed: Bobbetta Burnet, MD [facility] 07/02/2023

## 2023-07-02 NOTE — Plan of Care (Signed)
 Went over discharge instructions with patient, she verbalized her understanding. Pts vitals stable, PIV intact upon removal. Patient going by private vehicle.    Problem: Education: Goal: Ability to describe self-care measures that may prevent or decrease complications (Diabetes Survival Skills Education) will improve Outcome: Adequate for Discharge Goal: Individualized Educational Video(s) Outcome: Adequate for Discharge   Problem: Coping: Goal: Ability to adjust to condition or change in health will improve Outcome: Adequate for Discharge   Problem: Fluid Volume: Goal: Ability to maintain a balanced intake and output will improve Outcome: Adequate for Discharge   Problem: Health Behavior/Discharge Planning: Goal: Ability to identify and utilize available resources and services will improve Outcome: Adequate for Discharge Goal: Ability to manage health-related needs will improve Outcome: Adequate for Discharge   Problem: Metabolic: Goal: Ability to maintain appropriate glucose levels will improve Outcome: Adequate for Discharge   Problem: Nutritional: Goal: Maintenance of adequate nutrition will improve Outcome: Adequate for Discharge Goal: Progress toward achieving an optimal weight will improve Outcome: Adequate for Discharge   Problem: Skin Integrity: Goal: Risk for impaired skin integrity will decrease Outcome: Adequate for Discharge   Problem: Tissue Perfusion: Goal: Adequacy of tissue perfusion will improve Outcome: Adequate for Discharge   Problem: Education: Goal: Knowledge of General Education information will improve Description: Including pain rating scale, medication(s)/side effects and non-pharmacologic comfort measures Outcome: Adequate for Discharge   Problem: Health Behavior/Discharge Planning: Goal: Ability to manage health-related needs will improve Outcome: Adequate for Discharge   Problem: Clinical Measurements: Goal: Ability to maintain  clinical measurements within normal limits will improve Outcome: Adequate for Discharge Goal: Will remain free from infection Outcome: Adequate for Discharge Goal: Diagnostic test results will improve Outcome: Adequate for Discharge Goal: Respiratory complications will improve Outcome: Adequate for Discharge Goal: Cardiovascular complication will be avoided Outcome: Adequate for Discharge   Problem: Activity: Goal: Risk for activity intolerance will decrease Outcome: Adequate for Discharge   Problem: Nutrition: Goal: Adequate nutrition will be maintained Outcome: Adequate for Discharge   Problem: Coping: Goal: Level of anxiety will decrease Outcome: Adequate for Discharge   Problem: Elimination: Goal: Will not experience complications related to bowel motility Outcome: Adequate for Discharge Goal: Will not experience complications related to urinary retention Outcome: Adequate for Discharge   Problem: Pain Managment: Goal: General experience of comfort will improve and/or be controlled Outcome: Adequate for Discharge   Problem: Safety: Goal: Ability to remain free from injury will improve Outcome: Adequate for Discharge   Problem: Skin Integrity: Goal: Risk for impaired skin integrity will decrease Outcome: Adequate for Discharge   Problem: Education: Goal: Knowledge of disease or condition will improve Outcome: Adequate for Discharge Goal: Knowledge of the prescribed therapeutic regimen will improve Outcome: Adequate for Discharge Goal: Individualized Educational Video(s) Outcome: Adequate for Discharge   Problem: Activity: Goal: Ability to tolerate increased activity will improve Outcome: Adequate for Discharge Goal: Will verbalize the importance of balancing activity with adequate rest periods Outcome: Adequate for Discharge   Problem: Respiratory: Goal: Ability to maintain a clear airway will improve Outcome: Adequate for Discharge Goal: Levels of  oxygenation will improve Outcome: Adequate for Discharge Goal: Ability to maintain adequate ventilation will improve Outcome: Adequate for Discharge

## 2023-07-09 DIAGNOSIS — E1121 Type 2 diabetes mellitus with diabetic nephropathy: Secondary | ICD-10-CM | POA: Diagnosis not present

## 2023-07-09 DIAGNOSIS — C3411 Malignant neoplasm of upper lobe, right bronchus or lung: Secondary | ICD-10-CM | POA: Diagnosis not present

## 2023-07-09 DIAGNOSIS — D696 Thrombocytopenia, unspecified: Secondary | ICD-10-CM | POA: Diagnosis not present

## 2023-07-09 DIAGNOSIS — R9431 Abnormal electrocardiogram [ECG] [EKG]: Secondary | ICD-10-CM | POA: Diagnosis not present

## 2023-07-09 DIAGNOSIS — J441 Chronic obstructive pulmonary disease with (acute) exacerbation: Secondary | ICD-10-CM | POA: Diagnosis not present

## 2023-07-09 DIAGNOSIS — E875 Hyperkalemia: Secondary | ICD-10-CM | POA: Diagnosis not present

## 2023-07-09 DIAGNOSIS — N390 Urinary tract infection, site not specified: Secondary | ICD-10-CM | POA: Diagnosis not present

## 2023-07-22 ENCOUNTER — Ambulatory Visit: Payer: Self-pay

## 2023-07-22 NOTE — Telephone Encounter (Signed)
 NFN

## 2023-07-22 NOTE — Telephone Encounter (Addendum)
 FYI Only or Action Required?: FYI only for provider  Patient is followed in Pulmonology for COPD, last seen on 06/05/2023 by Denson Flake, MD. Called Nurse Triage reporting Breathing Problem. Symptoms began about a month ago. Interventions attempted: Rescue inhaler, Maintenance inhaler, and Nebulizer treatments. Symptoms are: unchanged.  Triage Disposition: See Physician Within 24 Hours  Patient/caregiver understands and will follow disposition?: Yes   Copied from CRM [id number]. Topic: Clinical - Red Word Triage >> Jul 22, 2023  9:03 AM Corean Deutscher wrote: Red Word that prompted transfer to Nurse Triage: Oxygen levels low and breathing prblems, wheezing really bad and on inhaler shortness of breath.    ----------------------------------------------------------------------- From previous Reason for Contact - Scheduling: Patient/patient representative is calling to schedule an appointment. Refer to attachments for appointment information. Reason for Disposition  [1] MILD difficulty breathing (e.g., minimal/no SOB at rest, SOB with walking) AND [2] worse than normal  Answer Assessment - Initial Assessment Questions E2C2 Pulmonary Triage - Initial Assessment Questions Chief Complaint (e.g., cough, sob, wheezing, fever, chills, sweat or additional symptoms) *Go to specific symptom protocol after initial questions. Wheezing, cough - yellow, thick Reports recent hospitalization - received cough medicine, INH - reports still feeling the same  How long have symptoms been present? Last month  Have you tested for COVID or Flu? Note: If not, ask patient if a home test can be taken. If so, instruct patient to call back for positive results. No  MEDICINES:   Have you used any OTC meds to help with symptoms? Yes If yes, ask What medications? Tussin DM Max - does not provide relief  Have you used your inhalers/maintenance medication? Yes If yes, What medications? DuoNEB - twice  daily Albuterol  INH - 3 x a day SPIRIVA  RESPIMAT - 2 puffs once a day  If inhaler, ask How many puffs and how often? Note: Review instructions on medication in the chart. See above  OXYGEN: Do you wear supplemental oxygen? No If yes, How many liters are you supposed to use? N/a  Do you monitor your oxygen levels? Yes If yes, What is your reading (oxygen level) today? 94  What is your usual oxygen saturation reading?  (Note: Pulmonary O2 sats should be 90% or greater) 97-98     8. BREATHING DIFFICULTY: Are you having any difficulty breathing? If Yes, ask: How bad is it?  (e.g., none, mild, moderate, severe)   - MILD: No SOB at rest, mild SOB with walking, speaks normally in sentences, able to lie down, no retractions, pulse < 100.   - MODERATE: SOB at rest, SOB with minimal exertion and prefers to sit, cannot lie down flat, speaks in phrases, mild retractions, audible wheezing, pulse 100-120.   - SEVERE: Very SOB at rest, speaks in single words, struggling to breathe, sitting hunched forward, retractions, pulse > 120      Moderate Triager does appreciate audible mild SOBg during call. Pt is speaking in full sentences.  9. OTHER SYMPTOMS: Do you have any other symptoms? (e.g., fever, change in sputum)     Coughing thick yellow sputum, wheezing   Triager will forward encounter for Dr. Baldwin Levee 's office to review since no sooner appt available. Patient added to waitlist and verbalized understanding. Triager also advised that pt should seek guidance from PCP to follow disposition for further evaluation/treatment in the interim.  Protocols used: COPD Oxygen Monitoring and Hypoxia-A-AH

## 2023-07-23 DIAGNOSIS — E1140 Type 2 diabetes mellitus with diabetic neuropathy, unspecified: Secondary | ICD-10-CM | POA: Diagnosis not present

## 2023-07-23 DIAGNOSIS — J439 Emphysema, unspecified: Secondary | ICD-10-CM | POA: Diagnosis not present

## 2023-07-23 DIAGNOSIS — I701 Atherosclerosis of renal artery: Secondary | ICD-10-CM | POA: Diagnosis not present

## 2023-07-23 DIAGNOSIS — E1121 Type 2 diabetes mellitus with diabetic nephropathy: Secondary | ICD-10-CM | POA: Diagnosis not present

## 2023-08-01 ENCOUNTER — Ambulatory Visit
Admission: RE | Admit: 2023-08-01 | Discharge: 2023-08-01 | Disposition: A | Discharge status: Home / Self Care | Source: Ambulatory Visit | Attending: Radiation Oncology | Admitting: Radiation Oncology

## 2023-08-01 DIAGNOSIS — C3411 Malignant neoplasm of upper lobe, right bronchus or lung: Secondary | ICD-10-CM

## 2023-08-01 NOTE — Progress Notes (Signed)
 Spoke with the patient to see how she was doing since completion of her radiation treatments.  She states she has been doing fairly well since she completed SBRT to her right lung 06/26/2023. She reports she has persistent cough that is not relieved with guaifenesin -codeine  syrup that Dr. Izell prescribed her.  She states some times she coughs so forceful and often that she vomits.  She continues to use her inhaler and nebulizer treatments to alleviate occasional wheezing and SOB.  I informed her I would make Dr. Izell aware of her concerns and let her know that she is due for CT Chest and would be hearing from the schedulers.  She verbalized understanding.

## 2023-08-02 ENCOUNTER — Other Ambulatory Visit: Payer: Self-pay

## 2023-08-02 ENCOUNTER — Encounter (HOSPITAL_COMMUNITY): Payer: Self-pay | Admitting: Internal Medicine

## 2023-08-02 ENCOUNTER — Observation Stay (HOSPITAL_COMMUNITY)
Admission: EM | Admit: 2023-08-02 | Discharge: 2023-08-03 | Disposition: A | Discharge status: Home / Self Care | Attending: Internal Medicine | Admitting: Internal Medicine

## 2023-08-02 ENCOUNTER — Emergency Department (HOSPITAL_COMMUNITY)

## 2023-08-02 DIAGNOSIS — C3411 Malignant neoplasm of upper lobe, right bronchus or lung: Secondary | ICD-10-CM | POA: Diagnosis present

## 2023-08-02 DIAGNOSIS — R0902 Hypoxemia: Secondary | ICD-10-CM

## 2023-08-02 DIAGNOSIS — I739 Peripheral vascular disease, unspecified: Secondary | ICD-10-CM | POA: Diagnosis present

## 2023-08-02 DIAGNOSIS — E039 Hypothyroidism, unspecified: Secondary | ICD-10-CM | POA: Diagnosis present

## 2023-08-02 DIAGNOSIS — I6529 Occlusion and stenosis of unspecified carotid artery: Secondary | ICD-10-CM | POA: Diagnosis not present

## 2023-08-02 DIAGNOSIS — C3490 Malignant neoplasm of unspecified part of unspecified bronchus or lung: Secondary | ICD-10-CM | POA: Diagnosis not present

## 2023-08-02 DIAGNOSIS — J441 Chronic obstructive pulmonary disease with (acute) exacerbation: Secondary | ICD-10-CM | POA: Diagnosis not present

## 2023-08-02 DIAGNOSIS — Z7982 Long term (current) use of aspirin: Secondary | ICD-10-CM | POA: Diagnosis not present

## 2023-08-02 DIAGNOSIS — J9601 Acute respiratory failure with hypoxia: Secondary | ICD-10-CM | POA: Diagnosis not present

## 2023-08-02 DIAGNOSIS — R0603 Acute respiratory distress: Secondary | ICD-10-CM | POA: Diagnosis present

## 2023-08-02 DIAGNOSIS — E785 Hyperlipidemia, unspecified: Secondary | ICD-10-CM | POA: Diagnosis not present

## 2023-08-02 DIAGNOSIS — J449 Chronic obstructive pulmonary disease, unspecified: Secondary | ICD-10-CM | POA: Diagnosis present

## 2023-08-02 DIAGNOSIS — I110 Hypertensive heart disease with heart failure: Secondary | ICD-10-CM | POA: Insufficient documentation

## 2023-08-02 DIAGNOSIS — Z1152 Encounter for screening for COVID-19: Secondary | ICD-10-CM | POA: Diagnosis not present

## 2023-08-02 DIAGNOSIS — I700 Atherosclerosis of aorta: Secondary | ICD-10-CM | POA: Diagnosis not present

## 2023-08-02 DIAGNOSIS — E119 Type 2 diabetes mellitus without complications: Secondary | ICD-10-CM | POA: Insufficient documentation

## 2023-08-02 DIAGNOSIS — R0602 Shortness of breath: Secondary | ICD-10-CM | POA: Diagnosis not present

## 2023-08-02 DIAGNOSIS — I771 Stricture of artery: Secondary | ICD-10-CM | POA: Diagnosis not present

## 2023-08-02 DIAGNOSIS — G4733 Obstructive sleep apnea (adult) (pediatric): Secondary | ICD-10-CM | POA: Diagnosis not present

## 2023-08-02 DIAGNOSIS — I2510 Atherosclerotic heart disease of native coronary artery without angina pectoris: Secondary | ICD-10-CM | POA: Diagnosis not present

## 2023-08-02 DIAGNOSIS — I5032 Chronic diastolic (congestive) heart failure: Secondary | ICD-10-CM | POA: Diagnosis not present

## 2023-08-02 DIAGNOSIS — E1169 Type 2 diabetes mellitus with other specified complication: Secondary | ICD-10-CM | POA: Diagnosis present

## 2023-08-02 DIAGNOSIS — Z992 Dependence on renal dialysis: Secondary | ICD-10-CM | POA: Insufficient documentation

## 2023-08-02 DIAGNOSIS — J849 Interstitial pulmonary disease, unspecified: Secondary | ICD-10-CM | POA: Diagnosis present

## 2023-08-02 LAB — CBC WITH DIFFERENTIAL/PLATELET
Abs Immature Granulocytes: 0.02 10*3/uL (ref 0.00–0.07)
Basophils Absolute: 0 10*3/uL (ref 0.0–0.1)
Basophils Relative: 0 %
Eosinophils Absolute: 0.6 10*3/uL — ABNORMAL HIGH (ref 0.0–0.5)
Eosinophils Relative: 8 %
HCT: 43.5 % (ref 36.0–46.0)
Hemoglobin: 13.5 g/dL (ref 12.0–15.0)
Immature Granulocytes: 0 %
Lymphocytes Relative: 22 %
Lymphs Abs: 1.7 10*3/uL (ref 0.7–4.0)
MCH: 26.8 pg (ref 26.0–34.0)
MCHC: 31 g/dL (ref 30.0–36.0)
MCV: 86.5 fL (ref 80.0–100.0)
Monocytes Absolute: 0.7 10*3/uL (ref 0.1–1.0)
Monocytes Relative: 9 %
Neutro Abs: 4.5 10*3/uL (ref 1.7–7.7)
Neutrophils Relative %: 61 %
Platelets: 313 10*3/uL (ref 150–400)
RBC: 5.03 MIL/uL (ref 3.87–5.11)
RDW: 15.3 % (ref 11.5–15.5)
WBC: 7.4 10*3/uL (ref 4.0–10.5)
nRBC: 0 % (ref 0.0–0.2)

## 2023-08-02 LAB — GLUCOSE, CAPILLARY
Glucose-Capillary: 276 mg/dL — ABNORMAL HIGH (ref 70–99)
Glucose-Capillary: 310 mg/dL — ABNORMAL HIGH (ref 70–99)

## 2023-08-02 LAB — CBG MONITORING, ED: Glucose-Capillary: 276 mg/dL — ABNORMAL HIGH (ref 70–99)

## 2023-08-02 LAB — BASIC METABOLIC PANEL WITH GFR
Anion gap: 10 (ref 5–15)
BUN: 13 mg/dL (ref 8–23)
CO2: 20 mmol/L — ABNORMAL LOW (ref 22–32)
Calcium: 9 mg/dL (ref 8.9–10.3)
Chloride: 99 mmol/L (ref 98–111)
Creatinine, Ser: 0.98 mg/dL (ref 0.44–1.00)
GFR, Estimated: >60 mL/min (ref >60)
Glucose, Bld: 284 mg/dL — ABNORMAL HIGH (ref 70–99)
Potassium: 4.1 mmol/L (ref 3.5–5.1)
Sodium: 129 mmol/L — ABNORMAL LOW (ref 135–145)

## 2023-08-02 LAB — RESP PANEL BY RT-PCR (RSV, FLU A&B, COVID)  RVPGX2
Influenza A by PCR: NEGATIVE
Influenza B by PCR: NEGATIVE
Resp Syncytial Virus by PCR: NEGATIVE
SARS Coronavirus 2 by RT PCR: NEGATIVE

## 2023-08-02 LAB — I-STAT CHEM 8, ED
BUN: 13 mg/dL (ref 8–23)
Calcium, Ion: 1.22 mmol/L (ref 1.15–1.40)
Chloride: 101 mmol/L (ref 98–111)
Creatinine, Ser: 0.9 mg/dL (ref 0.44–1.00)
Glucose, Bld: 308 mg/dL — ABNORMAL HIGH (ref 70–99)
HCT: 45 % (ref 36.0–46.0)
Hemoglobin: 15.3 g/dL — ABNORMAL HIGH (ref 12.0–15.0)
Potassium: 5 mmol/L (ref 3.5–5.1)
Sodium: 135 mmol/L (ref 135–145)
TCO2: 27 mmol/L (ref 22–32)

## 2023-08-02 LAB — MRSA NEXT GEN BY PCR, NASAL: MRSA by PCR Next Gen: NOT DETECTED

## 2023-08-02 LAB — BRAIN NATRIURETIC PEPTIDE: B Natriuretic Peptide: 70.8 pg/mL (ref 0.0–100.0)

## 2023-08-02 MED ORDER — ACETAMINOPHEN 325 MG PO TABS: 650.0000 mg | ORAL_TABLET | Freq: Four times a day (QID) | ORAL | Status: DC | PRN

## 2023-08-02 MED ORDER — METHYLPREDNISOLONE SODIUM SUCC 125 MG IJ SOLR
125.0000 mg | Freq: Once | INTRAMUSCULAR | Status: AC
  Administered 2023-08-02: 125 mg via INTRAVENOUS
  Filled 2023-08-02: qty 2

## 2023-08-02 MED ORDER — VITAMIN B-12 100 MCG PO TABS
500.0000 ug | ORAL_TABLET | Freq: Every day | ORAL | Status: DC
  Administered 2023-08-03: 500 ug via ORAL
  Filled 2023-08-02: qty 5

## 2023-08-02 MED ORDER — PROCHLORPERAZINE EDISYLATE 10 MG/2ML IJ SOLN: 10.0000 mg | Freq: Four times a day (QID) | INTRAMUSCULAR | Status: DC | PRN

## 2023-08-02 MED ORDER — ROSUVASTATIN CALCIUM 20 MG PO TABS
40.0000 mg | ORAL_TABLET | Freq: Every day | ORAL | Status: DC
  Administered 2023-08-03: 40 mg via ORAL
  Filled 2023-08-02: qty 2

## 2023-08-02 MED ORDER — INSULIN ASPART 100 UNIT/ML IJ SOLN
8.0000 [IU] | Freq: Once | INTRAMUSCULAR | Status: AC
  Administered 2023-08-02: 8 [IU] via SUBCUTANEOUS

## 2023-08-02 MED ORDER — IPRATROPIUM-ALBUTEROL 0.5-2.5 (3) MG/3ML IN SOLN
3.0000 mL | Freq: Four times a day (QID) | RESPIRATORY_TRACT | Status: DC
  Administered 2023-08-02 – 2023-08-03 (×4): 3 mL via RESPIRATORY_TRACT
  Filled 2023-08-02 (×5): qty 3

## 2023-08-02 MED ORDER — AMLODIPINE BESYLATE 10 MG PO TABS
10.0000 mg | ORAL_TABLET | Freq: Every day | ORAL | Status: DC
  Administered 2023-08-03: 10 mg via ORAL
  Filled 2023-08-02: qty 1

## 2023-08-02 MED ORDER — DICYCLOMINE HCL 10 MG PO CAPS
10.0000 mg | ORAL_CAPSULE | Freq: Three times a day (TID) | ORAL | Status: DC
  Administered 2023-08-02 – 2023-08-03 (×4): 10 mg via ORAL
  Filled 2023-08-02 (×4): qty 1

## 2023-08-02 MED ORDER — VITAMIN D 25 MCG (1000 UNIT) PO TABS
5000.0000 [IU] | ORAL_TABLET | Freq: Every day | ORAL | Status: DC
  Administered 2023-08-03: 5000 [IU] via ORAL
  Filled 2023-08-02: qty 5

## 2023-08-02 MED ORDER — ORAL CARE MOUTH RINSE: 15.0000 mL | OROMUCOSAL | Status: DC | PRN

## 2023-08-02 MED ORDER — IPRATROPIUM-ALBUTEROL 0.5-2.5 (3) MG/3ML IN SOLN
3.0000 mL | Freq: Once | RESPIRATORY_TRACT | Status: AC
  Administered 2023-08-02: 3 mL via RESPIRATORY_TRACT
  Filled 2023-08-02: qty 3

## 2023-08-02 MED ORDER — ACETAMINOPHEN 650 MG RE SUPP: 650.0000 mg | Freq: Four times a day (QID) | RECTAL | Status: DC | PRN

## 2023-08-02 MED ORDER — CILOSTAZOL 100 MG PO TABS
100.0000 mg | ORAL_TABLET | Freq: Two times a day (BID) | ORAL | Status: DC
  Administered 2023-08-02 – 2023-08-03 (×2): 100 mg via ORAL
  Filled 2023-08-02 (×2): qty 1

## 2023-08-02 MED ORDER — ALBUTEROL SULFATE (2.5 MG/3ML) 0.083% IN NEBU
2.5000 mg | INHALATION_SOLUTION | RESPIRATORY_TRACT | Status: DC | PRN
  Administered 2023-08-03: 2.5 mg via RESPIRATORY_TRACT
  Filled 2023-08-02: qty 3

## 2023-08-02 MED ORDER — ATENOLOL 50 MG PO TABS
100.0000 mg | ORAL_TABLET | Freq: Every day | ORAL | Status: DC
  Administered 2023-08-03: 100 mg via ORAL
  Filled 2023-08-02: qty 2

## 2023-08-02 MED ORDER — SPIRONOLACTONE 25 MG PO TABS
25.0000 mg | ORAL_TABLET | Freq: Every day | ORAL | Status: DC
  Administered 2023-08-03: 25 mg via ORAL
  Filled 2023-08-02: qty 1

## 2023-08-02 MED ORDER — PREDNISONE 20 MG PO TABS
40.0000 mg | ORAL_TABLET | Freq: Every day | ORAL | Status: DC
Start: 2023-08-03 — End: 2023-08-08
  Administered 2023-08-03: 40 mg via ORAL
  Filled 2023-08-02: qty 2

## 2023-08-02 MED ORDER — ASPIRIN 81 MG PO TBEC
81.0000 mg | DELAYED_RELEASE_TABLET | Freq: Every day | ORAL | Status: DC
  Administered 2023-08-03: 81 mg via ORAL
  Filled 2023-08-02: qty 1

## 2023-08-02 MED ORDER — INSULIN ASPART PROT & ASPART (70-30 MIX) 100 UNIT/ML ~~LOC~~ SUSP
60.0000 [IU] | Freq: Every day | SUBCUTANEOUS | Status: DC
  Administered 2023-08-03: 60 [IU] via SUBCUTANEOUS

## 2023-08-02 MED ORDER — HYDROXYZINE HCL 25 MG PO TABS
25.0000 mg | ORAL_TABLET | Freq: Four times a day (QID) | ORAL | Status: AC | PRN
  Administered 2023-08-02 – 2023-08-03 (×2): 25 mg via ORAL
  Filled 2023-08-02 (×2): qty 1

## 2023-08-02 MED ORDER — INSULIN ASPART PROT & ASPART (70-30 MIX) 100 UNIT/ML PEN: 40.0000 [IU] | PEN_INJECTOR | Freq: Two times a day (BID) | SUBCUTANEOUS | Status: DC

## 2023-08-02 MED ORDER — LORAZEPAM 2 MG/ML IJ SOLN
0.5000 mg | Freq: Once | INTRAMUSCULAR | Status: AC
  Administered 2023-08-02: 0.5 mg via INTRAVENOUS
  Filled 2023-08-02: qty 1

## 2023-08-02 MED ORDER — ALBUTEROL SULFATE (2.5 MG/3ML) 0.083% IN NEBU
15.0000 mg/h | INHALATION_SOLUTION | Freq: Once | RESPIRATORY_TRACT | Status: AC
  Administered 2023-08-02: 15 mg/h via RESPIRATORY_TRACT
  Filled 2023-08-02: qty 18

## 2023-08-02 MED ORDER — INSULIN ASPART 100 UNIT/ML IJ SOLN
0.0000 [IU] | Freq: Three times a day (TID) | INTRAMUSCULAR | Status: DC
  Administered 2023-08-02 (×2): 11 [IU] via SUBCUTANEOUS
  Administered 2023-08-03: 7 [IU] via SUBCUTANEOUS
  Administered 2023-08-03: 4 [IU] via SUBCUTANEOUS
  Administered 2023-08-03: 20 [IU] via SUBCUTANEOUS
  Filled 2023-08-02: qty 0.2

## 2023-08-02 MED ORDER — INSULIN ASPART PROT & ASPART (70-30 MIX) 100 UNIT/ML ~~LOC~~ SUSP
40.0000 [IU] | Freq: Every day | SUBCUTANEOUS | Status: DC
  Administered 2023-08-02 – 2023-08-03 (×2): 40 [IU] via SUBCUTANEOUS
  Filled 2023-08-02: qty 10

## 2023-08-02 MED ORDER — DM-GUAIFENESIN ER 30-600 MG PO TB12
1.0000 | ORAL_TABLET | Freq: Two times a day (BID) | ORAL | Status: DC
  Administered 2023-08-02 – 2023-08-03 (×2): 1 via ORAL
  Filled 2023-08-02 (×2): qty 1

## 2023-08-02 MED ORDER — LEVOTHYROXINE SODIUM 100 MCG PO TABS
100.0000 ug | ORAL_TABLET | Freq: Every day | ORAL | Status: DC
  Administered 2023-08-03: 100 ug via ORAL
  Filled 2023-08-02: qty 1

## 2023-08-02 MED ORDER — IPRATROPIUM-ALBUTEROL 0.5-2.5 (3) MG/3ML IN SOLN
RESPIRATORY_TRACT | Status: AC
  Administered 2023-08-02: 3 mL
  Filled 2023-08-02: qty 3

## 2023-08-02 MED ORDER — MAGNESIUM SULFATE 2 GM/50ML IV SOLN
2.0000 g | Freq: Once | INTRAVENOUS | Status: AC
  Administered 2023-08-02: 2 g via INTRAVENOUS
  Filled 2023-08-02: qty 50

## 2023-08-02 NOTE — Progress Notes (Signed)
   08/02/23 1145  BiPAP/CPAP/SIPAP  $ Non-Invasive Ventilator  Non-Invasive Vent Set Up;Non-Invasive Vent Initial  $ Face Mask Small Yes (Medium changed to a small per leakage.)  BiPAP/CPAP/SIPAP Pt Type Adult  BiPAP/CPAP/SIPAP SERVO  Mask Type Full face mask  Dentures removed? Not applicable  Mask Size Small  Set Rate (S)  15 breaths/min  Respiratory Rate 27 breaths/min  IPAP (S)  12 cmH20  EPAP (S)  5 cmH2O  Pressure Support 7 cmH20  FiO2 (%) (S)  100 % (Weaned to 45%, Sp02 100%.)  Flow Rate 0.9 lpm  Minute Ventilation 13.9  Leak 39  Peak Inspiratory Pressure (PIP) 15  Tidal Volume (Vt) 536  Patient Home Machine No  Patient Home Mask No  Patient Home Tubing No  Auto Titrate No  Press High Alarm 30 cmH2O  Nasal massage performed No (comment)  CPAP/SIPAP surface wiped down Yes  Device Plugged into RED Power Outlet Yes  Oxygen Percent 45 %  BiPAP/CPAP /SiPAP Vitals  Pulse Rate 81 (Simultaneous filing. User may not have seen previous data.)  Resp (!) 24 (Simultaneous filing. User may not have seen previous data.)  BP 136/70 (Simultaneous filing. User may not have seen previous data.)  SpO2 100 %  Bilateral Breath Sounds Expiratory wheezes;Diminished  MEWS Score/Color  MEWS Score 1  MEWS Score Color Green

## 2023-08-02 NOTE — Progress Notes (Signed)
   08/02/23 1416  BiPAP/CPAP/SIPAP  Reason BIPAP/CPAP not in use (S)   (Placed pt on 2 L [place], keep sats > 92%, placed bipap on standby. 100%)  BiPAP/CPAP /SiPAP Vitals  Pulse Rate 84  Resp (!) 28  BP 111/73  SpO2 99 %  MEWS Score/Color  MEWS Score 2  MEWS Score Color Yellow

## 2023-08-02 NOTE — Progress Notes (Signed)
   08/02/23 2329  BiPAP/CPAP/SIPAP  Reason BIPAP/CPAP not in use Non-compliant (PATIENT DOES NOT WANT TO WEAR. SHE IS DOING WELL AT THIS TIME. BIPAP IN THE ROOM IF NEEDED)  BiPAP/CPAP /SiPAP Vitals  Resp 20  MEWS Score/Color  MEWS Score 0  MEWS Score Color Landy
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
## 2023-08-02 NOTE — ED Notes (Signed)
 Patient is resting comfortably. Blankets given for patient comfort

## 2023-08-02 NOTE — ED Provider Notes (Cosign Needed Addendum)
 [name] Provider Note   CSN: [id number] Arrival date & time: 08/02/23  1104     Patient presents with: No chief complaint on file.   [name] is a 68 y.o. female who presents with acute shortness of breath, has a significant previous history of of COPD that was diagnosed within the last month.  Further has history of pulmonary nodules, and coronary artery disease.  Noted that she takes albuterol , and Spiriva  for her COPD.  She was seen at family practice on the 27th and was placed on a Medrol  dose pack for COPD with acute exacerbation at that time.  Review of primary care records shows a previous history of COPD/emphysema, suspected sarcoidosis, and recently diagnosed stage I squamous cell lung cancer.  Independently reviewed chest x-ray from previous visit on 5/26 and 5/11.  Similar asymmetric opacities in the right upper lung are noted without any acute findings or significant interval findings.   HPI     Prior to Admission medications   Medication Sig Start Date End Date Taking? Authorizing Provider  ACCU-CHEK GUIDE test strip  06/25/19   [provider]  Accu-Chek Softclix Lancets lancets  06/25/19   [provider]  albuterol  (PROVENTIL ) (2.5 MG/3ML) 0.083% nebulizer solution Take 3 mLs (2.5 mg total) by nebulization every 2 (two) hours as needed for wheezing or shortness of breath. 06/18/23   Barbarann Nest, MD  albuterol  (VENTOLIN  HFA) 108 [phone] Base) MCG/ACT inhaler Inhale 2 puffs into the lungs every 6 (six) hours as needed for wheezing or shortness of breath. 06/05/23   Byrum, Robert S, MD  amLODipine  (NORVASC ) 10 MG tablet Take 10 mg by mouth in the morning.    [provider]  aspirin  EC 81 MG tablet Take 1 tablet (81 mg total) by mouth daily. Swallow whole. 10/13/20   Dunn, Dayna N, PA-C  atenolol  (TENORMIN ) 100 MG tablet Take 1 tablet (100 mg total) by mouth daily. 12/18/22   Delford Maude BROCKS, MD  B-D  UF III MINI PEN NEEDLES 31G X 5 MM MISC  07/22/20   [provider]  Blood Glucose Monitoring Suppl (ACCU-CHEK GUIDE) w/Device KIT  06/29/19   [provider]  Cholecalciferol (VITAMIN D3) 125 MCG (5000 UT) TABS Take 5,000 Units by mouth in the morning.    [provider]  cilostazol  (PLETAL ) 100 MG tablet Take 1 tablet (100 mg total) by mouth 2 (two) times daily. 04/16/23   Delford Maude BROCKS, MD  dicyclomine  (BENTYL ) 10 MG capsule Take 10 mg by mouth 3 (three) times daily before meals.    [provider]  estradiol  (ESTRACE ) 0.1 MG/GM vaginal cream Apply 3 times weekly by using a pea-sized amount applied to fingertip Patient taking differently: Place vaginally See admin instructions. Apply 3 times weekly by using a pea-sized amount applied to fingertip 04/23/23   Shona Layman BROCKS, MD  Evolocumab  (REPATHA  SURECLICK) 140 MG/ML SOAJ INJECT 1 PEN INTO THE SKIN EVERY 14 (FOURTEEN) DAYS. 03/29/23   Darron Deatrice DELENA, MD  guaiFENesin  (MUCINEX ) 600 MG 12 hr tablet Take 1 tablet (600 mg total) by mouth 2 (two) times daily. 07/02/23   Shahmehdi, Adriana DELENA, MD  guaiFENesin -codeine  100-10 MG/5ML syrup Take 7.5 mLs by mouth every 4 (four) hours as needed for cough. 06/21/23   Izell Domino, MD  insulin  aspart protamine  - aspart (NOVOLOG  70/30 MIX) (70-30) 100 UNIT/ML FlexPen Inject 40-60 Units into the skin. Inject 60 units into the skin  in the morning and 40 units at bedtime 05/16/16   [provider]  ipratropium-albuterol  (DUONEB) 0.5-2.5 (3) MG/3ML SOLN Take 3 mLs by nebulization every 8 (eight) hours as needed (for shortness of breathing or wheezing).    [provider]  levothyroxine  (SYNTHROID ) 100 MCG tablet Take 100 mcg by mouth daily before breakfast.    [provider]  methenamine  (HIPREX ) 1 g tablet Take 1 tablet (1 g total) by mouth 2 (two) times daily with a meal. Take along with a vit C tablet as directed Patient taking differently: Take 1 g by  mouth 2 (two) times daily with a meal. 04/23/23   Shona Layman BROCKS, MD  methylPREDNISolone  (MEDROL  DOSEPAK) 4 MG TBPK tablet Medrol  Dosepak take as instructed 07/02/23   Shahmehdi, Seyed A, MD  MOUNJARO 12.5 MG/0.5ML Pen Inject 12.5 mg into the skin every Friday.    [provider]  rosuvastatin  (CRESTOR ) 40 MG tablet Take 1 tablet (40 mg total) by mouth daily. 06/23/20 07/01/23  Pokhrel, Laxman, MD  SPIRIVA  RESPIMAT 2.5 MCG/ACT AERS Inhale 2 puffs into the lungs daily.    [provider]  spironolactone  (ALDACTONE ) 25 MG tablet Take 1 tablet (25 mg total) by mouth daily. Patient taking differently: Take 25 mg by mouth in the morning. 04/03/23   Singh, Prashant K, MD  vitamin C (ASCORBIC ACID) 250 MG tablet Take 250 mg by mouth See admin instructions. Take 250 mg by mouth twice a day, with each dose of Methenamine     [provider]    Allergies: Metformin  and related, Strawberry extract, Cefdinir, Oxycodone  hcl, Sulfamethoxazole-trimethoprim, Topiramate , Cymbalta [duloxetine hcl], Hydrocodone  bit-homatrop mbr, Lisinopril , Lunesta [eszopiclone], Other, Sulfa antibiotics, Tramadol, Trazodone and nefazodone, and Penicillins    Review of Systems  Updated Vital Signs There were no vitals taken for this visit.  Physical Exam Vitals and nursing note reviewed.  Constitutional:      General: She is not in acute distress.    Appearance: Normal appearance.  HENT:     Head: Normocephalic and atraumatic.     Mouth/Throat:     Mouth: Mucous membranes are moist.     Pharynx: Oropharynx is clear.   Eyes:     Extraocular Movements: Extraocular movements intact.     Conjunctiva/sclera: Conjunctivae normal.     Pupils: Pupils are equal, round, and reactive to light.    Cardiovascular:     Rate and Rhythm: Normal rate and regular rhythm.     Pulses: Normal pulses.     Heart sounds: Normal heart sounds. No murmur heard.    No friction rub. No gallop.  Pulmonary:     Effort:  Pulmonary effort is normal. Tachypnea present. No accessory muscle usage.     Breath sounds: Decreased air movement present. Examination of the right-upper field reveals wheezing. Examination of the left-upper field reveals wheezing. Examination of the right-middle field reveals wheezing. Examination of the left-middle field reveals wheezing. Examination of the right-lower field reveals wheezing. Examination of the left-lower field reveals wheezing. Wheezing present.  Abdominal:     General: Abdomen is flat. Bowel sounds are normal.     Palpations: Abdomen is soft.   Musculoskeletal:        General: Normal range of motion.     Cervical back: Normal range of motion and neck supple.     Right lower leg: No edema.     Left lower leg: No edema.   Skin:    General: Skin is warm and  dry.     Capillary Refill: Capillary refill takes less than 2 seconds.   Neurological:     General: No focal deficit present.     Mental Status: She is alert. Mental status is at baseline.   Psychiatric:        Mood and Affect: Mood normal.     (all labs ordered are listed, but only abnormal results are displayed) Labs Reviewed  RESP PANEL BY RT-PCR (RSV, FLU A&B, COVID)  [id number]  BASIC METABOLIC PANEL WITH GFR  CBC WITH DIFFERENTIAL/PLATELET  BRAIN NATRIURETIC PEPTIDE  I-STAT CHEM 8, ED    EKG: None  Radiology: No results found.   .Critical Care  Performed by: Myriam Dorn BROCKS, PA Authorized by: Myriam Dorn BROCKS, PA   Critical care provider statement:    Critical care time (minutes):  30   Critical care time was exclusive of:  Separately billable procedures and treating other patients and teaching time   Critical care was necessary to treat or prevent imminent or life-threatening deterioration of the following conditions:  Respiratory failure   Critical care was time spent personally by me on the following activities:  Development of treatment plan with patient or surrogate, discussions  with consultants, evaluation of patient's response to treatment, examination of patient, ordering and performing treatments and interventions, ordering and review of laboratory studies, ordering and review of radiographic studies, pulse oximetry, re-evaluation of patient's condition and review of old charts   I assumed direction of critical care for this patient from another provider in my specialty: no     Care discussed with: admitting provider      Medications Ordered in the ED  methylPREDNISolone  sodium succinate (SOLU-MEDROL ) 125 mg/2 mL injection 125 mg (has no administration in time range)  ipratropium-albuterol  (DUONEB) 0.5-2.5 (3) MG/3ML nebulizer solution 3 mL (has no administration in time range)  ipratropium-albuterol  (DUONEB) 0.5-2.5 (3) MG/3ML nebulizer solution (3 mLs  Given 08/02/23 1113)                                    Medical Decision Making Amount and/or Complexity of Data Reviewed Labs: ordered. Radiology: ordered.  Risk Prescription drug management. Decision regarding hospitalization.   Medical Decision Making:   DANEE SOLLER is a 68 y.o. female who presented to the ED today with acute dyspnea and hypoxia detailed above.    External chart has been reviewed including primary care records along with review of outside imaging. Patient's presentation is complicated by their history of COPD as well as squamous cell carcinoma of the right lung..  Patient placed on continuous vitals and telemetry monitoring while in ED which was reviewed periodically.  Complete initial physical exam performed, notably the patient  was alert and oriented and in no acute distress.  Notable for dyspnea and wheezing as noted in physical exam findings. .    Reviewed and confirmed nursing documentation for past medical history, family history, social history.    Initial Assessment:   With the patient's presentation of dyspnea and hypoxia, most likely diagnosis is COPD  exacerbation/hypoxic respiratory failure.   Initial Plan:  Evaluate BNP to rule out heart failure. Screening labs including CBC and Metabolic panel to evaluate for infectious or metabolic etiology of disease.  Blood cultures to assess for infectious etiology CXR to evaluate for structural/infectious intrathoracic pathology.  EKG to evaluate for cardiac pathology Objective evaluation as below reviewed   Initial  Study Results:   Laboratory  All laboratory results reviewed without evidence of clinically relevant pathology.   Exceptions include: Glucose is noted at 308, he does have history of type 2 diabetes.  EKG EKG was reviewed independently. Rate, rhythm, axis, intervals all examined and without medically relevant abnormality. ST segments without concerns for elevations.    Radiology:  All images reviewed independently. Agree with radiology report at this time.   DG Chest Port 1 View Result Date: 08/02/2023 CLINICAL DATA:  Acute shortness of breath. History of non-small-cell lung cancer. EXAM: PORTABLE CHEST 1 VIEW COMPARISON:  X-ray 07/01/2023 and older.  CTA 06/16/2023 FINDINGS: Asymmetric opacity at the right upper lung is again seen. Vascular congestion interstitial changes are again seen. Stable cardiopericardial silhouette with a calcified and tortuous aorta. No pneumothorax or effusion. Overlapping cardiac leads. Films are under penetrated. IMPRESSION: No significant interval change when adjusted for technique. Electronically Signed   By: Ranell Bring M.D.   On: 08/02/2023 11:31      Consults: Case discussed with Dr. Celinda with the hospitalist team who accepts patient for admission..   Reassessment and Plan:   Based on evaluation of this patient, believe her to be having an acute COPD exacerbation along with hypoxic respiratory failure.  As such we will consult with hospitalist for inpatient admission.  She has been started on BiPAP, as well as continuous albuterol  nebulizer as  noted.  This is improved her work of breathing, and objectively vital signs have improved with reduced respiratory rate and improved oxygen saturations.       Final diagnoses:  None    ED Discharge Orders     None          Myriam Dorn BROCKS, PA 08/02/23 1243    Myriam Dorn BROCKS, [place] 08/02/23 1324    Charlyn Sora, MD 08/03/23 [phone]

## 2023-08-02 NOTE — ED Notes (Signed)
 Asked provider for medication for anxiety per pt and sister request

## 2023-08-02 NOTE — ED Notes (Signed)
Visitor to room  

## 2023-08-02 NOTE — ED Triage Notes (Signed)
 Pt arrives from front, few days ongoing sob, o2 here mid 60s RA. Placed on 15LNRB and up to 98%. Respiratory therapist and provider at bedside performing assessment, duo neb performed and pt now on bipap. Pt states she is feeling like she's fixing to die.

## 2023-08-02 NOTE — Plan of Care (Signed)
  Problem: Education: Goal: Ability to describe self-care measures that may prevent or decrease complications (Diabetes Survival Skills Education) will improve Outcome: Progressing Goal: Individualized Educational Video(s) Outcome: Progressing   Problem: Coping: Goal: Ability to adjust to condition or change in health will improve Outcome: Progressing   Problem: Fluid Volume: Goal: Ability to maintain a balanced intake and output will improve Outcome: Progressing   Problem: Health Behavior/Discharge Planning: Goal: Ability to identify and utilize available resources and services will improve Outcome: Progressing Goal: Ability to manage health-related needs will improve Outcome: Progressing   Problem: Metabolic: Goal: Ability to maintain appropriate glucose levels will improve Outcome: Progressing   Problem: Nutritional: Goal: Maintenance of adequate nutrition will improve Outcome: Progressing Goal: Progress toward achieving an optimal weight will improve Outcome: Progressing   Problem: Skin Integrity: Goal: Risk for impaired skin integrity will decrease Outcome: Progressing   Problem: Tissue Perfusion: Goal: Adequacy of tissue perfusion will improve Outcome: Progressing   Problem: Education: Goal: Knowledge of disease or condition will improve Outcome: Progressing Goal: Knowledge of the prescribed therapeutic regimen will improve Outcome: Progressing Goal: Individualized Educational Video(s) Outcome: Progressing   Problem: Activity: Goal: Ability to tolerate increased activity will improve Outcome: Progressing Goal: Will verbalize the importance of balancing activity with adequate rest periods Outcome: Progressing   Problem: Respiratory: Goal: Ability to maintain a clear airway will improve Outcome: Progressing   Problem: Education: Goal: Knowledge of General Education information will improve Description: Including pain rating scale, medication(s)/side  effects and non-pharmacologic comfort measures Outcome: Progressing   Problem: Health Behavior/Discharge Planning: Goal: Ability to manage health-related needs will improve Outcome: Progressing   Problem: Clinical Measurements: Goal: Ability to maintain clinical measurements within normal limits will improve Outcome: Progressing Goal: Will remain free from infection Outcome: Progressing Goal: Diagnostic test results will improve Outcome: Progressing Goal: Respiratory complications will improve Outcome: Progressing Goal: Cardiovascular complication will be avoided Outcome: Progressing   Problem: Activity: Goal: Risk for activity intolerance will decrease Outcome: Progressing   Problem: Nutrition: Goal: Adequate nutrition will be maintained Outcome: Progressing   Problem: Coping: Goal: Level of anxiety will decrease Outcome: Progressing   Problem: Elimination: Goal: Will not experience complications related to bowel motility Outcome: Progressing Goal: Will not experience complications related to urinary retention Outcome: Progressing   Problem: Pain Managment: Goal: General experience of comfort will improve and/or be controlled Outcome: Progressing   Problem: Safety: Goal: Ability to remain free from injury will improve Outcome: Progressing   Problem: Skin Integrity: Goal: Risk for impaired skin integrity will decrease Outcome: Progressing

## 2023-08-03 ENCOUNTER — Other Ambulatory Visit (HOSPITAL_COMMUNITY): Payer: Self-pay

## 2023-08-03 DIAGNOSIS — J441 Chronic obstructive pulmonary disease with (acute) exacerbation: Secondary | ICD-10-CM | POA: Diagnosis not present

## 2023-08-03 DIAGNOSIS — J9601 Acute respiratory failure with hypoxia: Secondary | ICD-10-CM | POA: Diagnosis not present

## 2023-08-03 LAB — GLUCOSE, CAPILLARY
Glucose-Capillary: 445 mg/dL — ABNORMAL HIGH (ref 70–99)
Glucose-Capillary: 197 mg/dL — ABNORMAL HIGH (ref 70–99)
Glucose-Capillary: 250 mg/dL — ABNORMAL HIGH (ref 70–99)

## 2023-08-03 LAB — COMPREHENSIVE METABOLIC PANEL WITH GFR
ALT: 11 U/L (ref 0–44)
AST: 19 U/L (ref 15–41)
Albumin: 3.3 g/dL — ABNORMAL LOW (ref 3.5–5.0)
Alkaline Phosphatase: 94 U/L (ref 38–126)
Anion gap: 9 (ref 5–15)
BUN: 12 mg/dL (ref 8–23)
CO2: 21 mmol/L — ABNORMAL LOW (ref 22–32)
Calcium: 9.2 mg/dL (ref 8.9–10.3)
Chloride: 103 mmol/L (ref 98–111)
Creatinine, Ser: 0.94 mg/dL (ref 0.44–1.00)
GFR, Estimated: >60 mL/min (ref >60)
Glucose, Bld: 309 mg/dL — ABNORMAL HIGH (ref 70–99)
Potassium: 4.3 mmol/L (ref 3.5–5.1)
Sodium: 133 mmol/L — ABNORMAL LOW (ref 135–145)
Total Bilirubin: 0.7 mg/dL (ref 0.0–1.2)
Total Protein: 8.1 g/dL (ref 6.5–8.1)

## 2023-08-03 LAB — CBC
HCT: 40.8 % (ref 36.0–46.0)
Hemoglobin: 12.8 g/dL (ref 12.0–15.0)
MCH: 27.5 pg (ref 26.0–34.0)
MCHC: 31.4 g/dL (ref 30.0–36.0)
MCV: 87.7 fL (ref 80.0–100.0)
Platelets: 279 10*3/uL (ref 150–400)
RBC: 4.65 MIL/uL (ref 3.87–5.11)
RDW: 15 % (ref 11.5–15.5)
WBC: 7.4 10*3/uL (ref 4.0–10.5)
nRBC: 0 % (ref 0.0–0.2)

## 2023-08-03 MED ORDER — ALBUTEROL SULFATE HFA 108 (90 BASE) MCG/ACT IN AERS
2.0000 | INHALATION_SPRAY | Freq: Four times a day (QID) | RESPIRATORY_TRACT | 0 refills | Status: DC | PRN
  Filled 2023-08-03: qty 6.7, 30d supply, fill #0

## 2023-08-03 MED ORDER — HYDROXYZINE HCL 25 MG PO TABS: 25.0000 mg | ORAL_TABLET | Freq: Three times a day (TID) | ORAL | 0 refills | Status: AC | PRN

## 2023-08-03 MED ORDER — ALBUTEROL SULFATE HFA 108 (90 BASE) MCG/ACT IN AERS: 2.0000 | INHALATION_SPRAY | Freq: Four times a day (QID) | RESPIRATORY_TRACT | 2 refills | Status: DC | PRN

## 2023-08-03 MED ORDER — HYDROCOD POLI-CHLORPHE POLI ER 10-8 MG/5ML PO SUER
5.0000 mL | Freq: Once | ORAL | Status: AC
  Administered 2023-08-03: 5 mL via ORAL
  Filled 2023-08-03: qty 5

## 2023-08-03 MED ORDER — HYDROXYZINE HCL 25 MG PO TABS
25.0000 mg | ORAL_TABLET | Freq: Three times a day (TID) | ORAL | Status: DC | PRN
  Administered 2023-08-03: 25 mg via ORAL
  Filled 2023-08-03: qty 1

## 2023-08-03 MED ORDER — PREDNISONE 10 MG PO TABS: ORAL_TABLET | ORAL | 0 refills | Status: AC

## 2023-08-03 MED ORDER — GUAIFENESIN-CODEINE 100-10 MG/5ML PO SOLN: 10.0000 mL | ORAL | Status: DC | PRN

## 2023-08-03 MED ORDER — HYDROXYZINE HCL 25 MG PO TABS
25.0000 mg | ORAL_TABLET | Freq: Three times a day (TID) | ORAL | 0 refills | Status: DC | PRN
Start: 2023-08-03 — End: 2023-08-03
  Filled 2023-08-03: qty 90, 30d supply, fill #0

## 2023-08-03 MED ORDER — PREDNISONE 10 MG PO TABS
ORAL_TABLET | ORAL | 0 refills | Status: DC
  Filled 2023-08-03: qty 15, 8d supply, fill #0

## 2023-08-03 MED ORDER — BENZONATATE 100 MG PO CAPS
100.0000 mg | ORAL_CAPSULE | Freq: Three times a day (TID) | ORAL | Status: DC | PRN
  Administered 2023-08-03 (×2): 200 mg via ORAL
  Filled 2023-08-03 (×2): qty 2

## 2023-08-03 NOTE — Discharge Summary (Signed)
 Physician Discharge Summary   Patient: [name] MRN: [id number] DOB: 04/20/1955  Admit date:     08/02/2023  Discharge date: 08/03/23  Discharge Physician: Garnette Pelt   PCP: Verena Mems, MD   Recommendations at discharge:   Follow up with PCP in 1-2 weeks   Discharge Diagnoses: Principal Problem:   Acute respiratory failure with hypoxia (HCC) Active Problems:   COPD with acute exacerbation (HCC)   Cancer of upper lobe of right lung (HCC)   Insulin  dependent type 2 diabetes mellitus (HCC)   Hyperlipidemia associated with type 2 diabetes mellitus (HCC)   CAD (coronary artery disease)   Carotid stenosis   Peripheral artery disease (HCC)   Hypothyroidism   Interstitial lung disease (HCC)   Obstructive sleep apnea   Chronic diastolic CHF (congestive heart failure) (HCC)  Resolved Problems:   * No resolved hospital problems. *  Hospital Course: 68 y.o. female with medical history significant of GERD, anxiety, history of acute renal failure, osteoarthritis, asthma, CAD, carotid artery occlusion, chronic abdominal pain,, migraine, dehydration, type II DM, diabetic hyperosmolar nonketotic state episode, headache, hepatic asteatosis, obesity, nephrolithiasis, hyperlipidemia, hypertension, hypothyroidism, PAD, prolonged QT interval, SBO, history of nonhemorrhagic CVA, OSA on CPAP, history of pneumonia, pulmonary nodules, history of lung cancer who presented to the emergency department complaints of progressively more dyspnea, wheezing, cough, fatigue and pleuritic chest pain has been progressively worse for the last few days. She denied fever, chills, rhinorrhea, sore throat or hemoptysis. No palpitations, diaphoresis, PND, orthopnea or pitting edema of the lower extremities. No abdominal pain, nausea, emesis, diarrhea, constipation, melena or hematochezia. No flank pain, dysuria, frequency or hematuria.  No polyuria, polydipsia, polyphagia or blurred vision.   Assessment and  Plan: Principal Problem:   Acute respiratory failure with hypoxia (HCC) In the setting of:   COPD with acute exacerbation (HCC) Superimposed on:   History interstitial lung disease   Cancer of upper lobe of right lung (HCC) -Much improved overnight with anxiolytic vistaril -Given IV steroids this visit with scheduled neb tx -Able to ambulate in hallway maintaining O2 sats -Of note, pt described marked panic attacks after the recent diagnosis of cancer this past month. Pt reported much improvement with vistaril. Will prescribe    Active Problems:   Obstructive sleep apnea BiPAP at bedtime.     Chronic diastolic CHF (congestive heart failure) (HCC) No signs of volume overload.     Insulin  dependent type 2 diabetes mellitus (HCC) Hemoglobin A1c 7.7% on May 12 this year. Carbohydrate modified diet. CBG monitoring with RI SS this visit     Hyperlipidemia calculated with type 2 diabetes mellitus (HCC) Continue rosuvastatin  40 mg daily     CAD (coronary artery disease) Continue atenolol , aspirin  and statin.     Hypothyroidism Continue levothyroxine  100 mcg p.o. daily.     Carotid stenosis Continue antiplatelet therapy. Continue rosuvastatin  40 mg p.o. daily.     Peripheral arterial disease. Continue cilostazol  and rosuvastatin .     Essential hypertension Continue amlodipine  10 mg p.o. daily. Continue atenolol  100 mg p.o. daily       Consultants:  Procedures performed:   Disposition: Home Diet recommendation:  Regular diet DISCHARGE MEDICATION: Allergies as of 08/03/2023       Reactions   Metformin  And Related Other (See Comments)   Damaged patient's kidneys   Strawberry Extract Anaphylaxis   Cefdinir Other (See Comments)   Reaction not named   Lisinopril  Itching, Swelling, Other (See Comments)   Angioedema  Oxycodone  Hcl Nausea And Vomiting   Sulfa Antibiotics Itching, Swelling, Other (See Comments)   Angioedema   Topiramate  Nausea And Vomiting, Other (See  Comments)   Dizzy, Blurred Vision   Whole Blood Other (See Comments)   Jehova's witness   Cymbalta [duloxetine Hcl] Other (See Comments)   Dizzy   Hydrocodone  Bit-homatrop Mbr Nausea And Vomiting   Lunesta [eszopiclone] Other (See Comments)   Bad taste in mouth    Penicillins Rash, Other (See Comments)   Paralyzed (per patient) Has patient had a PCN reaction causing immediate rash, facial/tongue/throat swelling, SOB or lightheadedness with hypotension: Yes Has patient had a PCN reaction causing severe rash involving mucus membranes or skin necrosis: No Has patient had a PCN reaction that required hospitalization Yes Has patient had a PCN reaction occurring within the last 10 years: No If all of the above answers are NO, then may proceed with Cephalosporin use.   Tramadol Other (See Comments)   Hallucinations   Trazodone And Nefazodone Other (See Comments)   Shakes        Medication List     STOP taking these medications    guaiFENesin  600 MG 12 hr tablet Commonly known as: MUCINEX    guaiFENesin -codeine  100-10 MG/5ML syrup   methenamine  1 g tablet Commonly known as: HIPREX        TAKE these medications    albuterol  (2.5 MG/3ML) 0.083% nebulizer solution Commonly known as: PROVENTIL  Take 3 mLs (2.5 mg total) by nebulization every 2 (two) hours as needed for wheezing or shortness of breath.   albuterol  108 (90 Base) MCG/ACT inhaler Commonly known as: VENTOLIN  HFA Inhale 2 puffs into the lungs every 6 (six) hours as needed for wheezing or shortness of breath.   amLODipine  10 MG tablet Commonly known as: NORVASC  Take 10 mg by mouth in the morning.   aspirin  EC 81 MG tablet Take 1 tablet (81 mg total) by mouth daily. Swallow whole.   atenolol  100 MG tablet Commonly known as: TENORMIN  Take 1 tablet (100 mg total) by mouth daily.   cilostazol  100 MG tablet Commonly known as: PLETAL  Take 1 tablet (100 mg total) by mouth 2 (two) times daily.   cyanocobalamin   500 MCG tablet Commonly known as: VITAMIN B12 Take 500 mcg by mouth daily.   dicyclomine  10 MG capsule Commonly known as: BENTYL  Take 10 mg by mouth 3 (three) times daily before meals.   estradiol  0.1 MG/GM vaginal cream Commonly known as: ESTRACE  Apply 3 times weekly by using a pea-sized amount applied to fingertip What changed:  how much to take how to take this when to take this additional instructions   hydrOXYzine 25 MG tablet Commonly known as: ATARAX Take 1 tablet (25 mg total) by mouth 3 (three) times daily as needed for anxiety.   insulin  aspart protamine  - aspart (70-30) 100 UNIT/ML FlexPen Commonly known as: NOVOLOG  70/30 MIX Inject 40-60 Units into the skin 2 (two) times daily with a meal. Inject 60 units into the skin in the morning and 40 units at bedtime   ipratropium-albuterol  0.5-2.5 (3) MG/3ML Soln Commonly known as: DUONEB Take 3 mLs by nebulization every 8 (eight) hours as needed (for shortness of breathing or wheezing).   levothyroxine  100 MCG tablet Commonly known as: SYNTHROID  Take 100 mcg by mouth daily before breakfast.   Mounjaro 2.5 MG/0.5ML Pen Generic drug: tirzepatide Inject 2.5 mg into the skin once a week.   predniSONE  10 MG tablet Commonly known as: DELTASONE  Take 4 tablets (  40 mg total) by mouth daily for 2 days, THEN 2 tablets (20 mg total) daily for 2 days, THEN 1 tablet (10 mg total) daily for 2 days, THEN 0.5 tablets (5 mg total) daily for 2 days. Start taking on: August 03, 2023   Repatha  SureClick 140 MG/ML Soaj Generic drug: Evolocumab  INJECT 1 PEN INTO THE SKIN EVERY 14 (FOURTEEN) DAYS.   rosuvastatin  40 MG tablet Commonly known as: CRESTOR  Take 1 tablet (40 mg total) by mouth daily.   Spiriva  Respimat 2.5 MCG/ACT Aers Generic drug: Tiotropium Bromide Monohydrate  Inhale 2 puffs into the lungs daily.   spironolactone  25 MG tablet Commonly known as: ALDACTONE  Take 1 tablet (25 mg total) by mouth daily. What changed: when  to take this   vitamin C 250 MG tablet Commonly known as: ASCORBIC ACID Take 250 mg by mouth daily.   Vitamin D3 125 MCG (5000 UT) Tabs Take 5,000 Units by mouth in the morning.        Follow-up Information     Verena Mems, MD Follow up in 2 week(s).   Specialty: Family Medicine Why: Hospital follow up Contact information: [address] [place] [address] [phone]                Discharge Exam: [name]   08/02/23 1624  Weight: 103 kg   General exam: Awake, laying in bed, in nad Respiratory system: Normal respiratory effort, no wheezing Cardiovascular system: regular rate, s1, s2 Gastrointestinal system: Soft, nondistended, positive BS Central nervous system: CN2-12 grossly intact, strength intact Extremities: Perfused, no clubbing Skin: Normal skin turgor, no notable skin lesions seen Psychiatry: Mood normal // no visual hallucinations   Condition at discharge: fair  The results of significant diagnostics from this hospitalization (including imaging, microbiology, ancillary and laboratory) are listed below for reference.   Imaging Studies: DG Chest Port 1 View Result Date: 08/02/2023 CLINICAL DATA:  Acute shortness of breath. History of non-small-cell lung cancer. EXAM: PORTABLE CHEST 1 VIEW COMPARISON:  X-ray 07/01/2023 and older.  CTA 06/16/2023 FINDINGS: Asymmetric opacity at the right upper lung is again seen. Vascular congestion interstitial changes are again seen. Stable cardiopericardial silhouette with a calcified and tortuous aorta. No pneumothorax or effusion. Overlapping cardiac leads. Films are under penetrated. IMPRESSION: No significant interval change when adjusted for technique. Electronically Signed   By: Ranell Bring M.D.   On: 08/02/2023 11:31    Microbiology: Results for orders placed or performed during the hospital encounter of 08/02/23  Blood culture (routine x 2)     Status: None (Preliminary result)    Collection Time: 08/02/23 11:12 AM   Specimen: BLOOD RIGHT HAND  Result Value Ref Range Status   Specimen Description   Final    BLOOD RIGHT HAND Performed at [facility], [address], [place], [place] [address]    Special Requests   Final    BOTTLES DRAWN AEROBIC AND ANAEROBIC Blood Culture results may not be optimal due to an inadequate volume of blood received in culture bottles Performed at [facility], [address], [place], [place] [address]    Culture   Final    NO GROWTH < 24 HOURS Performed at [facility], [address], [place], [place] [address]    Report Status PENDING  Incomplete  Blood culture (routine x 2)     Status: None (Preliminary result)   Collection Time: 08/02/23 11:34 AM   Specimen: BLOOD  Result Value Ref Range Status   Specimen Description   Final    BLOOD RIGHT ANTECUBITAL Performed at [facility], [address] [place], [place] [address]    Special Requests   Final    BOTTLES DRAWN AEROBIC AND ANAEROBIC Blood Culture adequate volume Performed at [facility], [address] [place], [place] [address]    Culture   Final    NO GROWTH < 24 HOURS Performed at [facility], [address] [place], [place] [address]    Report Status PENDING  Incomplete  Resp panel by RT-PCR (RSV, Flu A&B, Covid) Anterior Nasal Swab     Status: None   Collection Time: 08/02/23 12:11 PM   Specimen: Anterior Nasal Swab  Result Value Ref Range Status   SARS Coronavirus 2 by RT PCR NEGATIVE NEGATIVE Final    Comment: (NOTE) SARS-CoV-2 target nucleic acids are NOT DETECTED.  The SARS-CoV-2 RNA is generally detectable in upper respiratory specimens during the acute phase of infection. The lowest concentration of SARS-CoV-2 viral copies this assay can detect is 138 copies/mL. A negative result does not preclude SARS-Cov-2 infection and should not be used as the sole basis for treatment  or other patient management decisions. A negative result may occur with  improper specimen collection/handling, submission of specimen other than nasopharyngeal swab, presence of viral mutation(s) within the areas targeted by this assay, and inadequate number of viral copies(<138 copies/mL). A negative result must be combined with clinical observations, patient history, and epidemiological information. The expected result is Negative.  Fact Sheet for Patients:  BloggerCourse.com  Fact Sheet for Healthcare Providers:  SeriousBroker.it  This test is no t yet approved or cleared by the [place]  FDA and  has been authorized for detection and/or diagnosis of SARS-CoV-2 by FDA under an Emergency Use Authorization (EUA). This EUA will remain  in effect (meaning this test can be used) for the duration of the COVID-19 declaration under Section 564(b)(1) of the Act, 21 U.S.C.section 360bbb-3(b)(1), unless the authorization is terminated  or revoked sooner.       Influenza A by PCR NEGATIVE NEGATIVE Final   Influenza B by PCR NEGATIVE NEGATIVE Final    Comment: (NOTE) The Xpert Xpress SARS-CoV-2/FLU/RSV plus assay is intended as an aid in the diagnosis of influenza from Nasopharyngeal swab specimens and should not be used as a sole basis for treatment. Nasal washings and aspirates are unacceptable for Xpert Xpress SARS-CoV-2/FLU/RSV testing.  Fact Sheet for Patients: BloggerCourse.com  Fact Sheet for Healthcare Providers: SeriousBroker.it  This test is not yet approved or cleared by the [place]  FDA and has been authorized for detection and/or diagnosis of SARS-CoV-2 by FDA under an Emergency Use Authorization (EUA). This EUA will remain in effect (meaning this test can be used) for the duration of the COVID-19 declaration under Section 564(b)(1) of the Act, 21 U.S.C. section  360bbb-3(b)(1), unless the authorization is terminated or revoked.     Resp Syncytial Virus by PCR NEGATIVE NEGATIVE Final    Comment: (NOTE) Fact Sheet for Patients: BloggerCourse.com  Fact Sheet for Healthcare Providers: SeriousBroker.it  This test is not yet approved or cleared by the [place]  FDA and has been authorized for detection and/or diagnosis of SARS-CoV-2 by FDA under an Emergency Use Authorization (EUA). This EUA will remain in effect (meaning this test can be used) for the duration of the COVID-19 declaration under Section 564(b)(1) of the Act, 21 U.S.C. section 360bbb-3(b)(1), unless the  authorization is terminated or revoked.  Performed at [facility], [address], [place], [place] [address]   MRSA Next Gen by PCR, Nasal     Status: None   Collection Time: 08/02/23  5:30 PM   Specimen: Nasal Mucosa; Nasal Swab  Result Value Ref Range Status   MRSA by PCR Next Gen NOT DETECTED NOT DETECTED Final    Comment: (NOTE) The GeneXpert MRSA Assay (FDA approved for NASAL specimens only), is one component of a comprehensive MRSA colonization surveillance program. It is not intended to diagnose MRSA infection nor to guide or monitor treatment for MRSA infections. Test performance is not FDA approved in patients less than 45 years old. Performed at [facility], [address], [place], [place] [address]     Labs: CBC: Recent Labs  Lab 08/02/23 1112 08/02/23 1124 08/03/23 0430  WBC 7.4  --  7.4  NEUTROABS 4.5  --   --   HGB 13.5 15.3* 12.8  HCT 43.5 45.0 40.8  MCV 86.5  --  87.7  PLT 313  --  279   Basic Metabolic Panel: Recent Labs  Lab 08/02/23 1124 08/02/23 1631 08/03/23 0430  NA 135 129* 133*  K 5.0 4.1 4.3  CL 101 99 103  CO2  --  20* 21*  GLUCOSE 308* 284* 309*  BUN 13 13 12   CREATININE 0.90 0.98 0.94  CALCIUM   --  9.0 9.2   Liver Function  Tests: Recent Labs  Lab 08/03/23 0430  AST 19  ALT 11  ALKPHOS 94  BILITOT 0.7  PROT 8.1  ALBUMIN  3.3*   CBG: Recent Labs  Lab 08/02/23 1501 08/02/23 1631 08/02/23 2106 08/03/23 0745 08/03/23 1211  GLUCAP 276* 276* 310* 445* 197*    Discharge time spent: less than 30 minutes.  Signed: Garnette Pelt, MD [facility] 08/03/2023

## 2023-08-03 NOTE — Progress Notes (Signed)
 SATURATION QUALIFICATIONS: (This note is used to comply with regulatory documentation for home oxygen)  Patient Saturations on Room Air at Rest = 93%  Patient Saturations on Room Air while Ambulating = 93%  Patient Saturations on 0 Liters of oxygen while Ambulating = 93%  Please briefly explain why patient needs home oxygen: No O2 needed

## 2023-08-03 NOTE — Hospital Course (Signed)
 68 y.o. female with medical history significant of GERD, anxiety, history of acute renal failure, osteoarthritis, asthma, CAD, carotid artery occlusion, chronic abdominal pain,, migraine, dehydration, type II DM, diabetic hyperosmolar nonketotic state episode, headache, hepatic asteatosis, obesity, nephrolithiasis, hyperlipidemia, hypertension, hypothyroidism, PAD, prolonged QT interval, SBO, history of nonhemorrhagic CVA, OSA on CPAP, history of pneumonia, pulmonary nodules, history of lung cancer who presented to the emergency department complaints of progressively more dyspnea, wheezing, cough, fatigue and pleuritic chest pain has been progressively worse for the last few days. She denied fever, chills, rhinorrhea, sore throat or hemoptysis. No palpitations, diaphoresis, PND, orthopnea or pitting edema of the lower extremities. No abdominal pain, nausea, emesis, diarrhea, constipation, melena or hematochezia. No flank pain, dysuria, frequency or hematuria.  No polyuria, polydipsia, polyphagia or blurred vision.

## 2023-08-03 NOTE — Care Management Obs Status (Signed)
 MEDICARE OBSERVATION STATUS NOTIFICATION   Patient Details  Name: [name] MRN: [id number] Date of Birth: 02/15/1955   Medicare Observation Status Notification Given:  Yes    Sheri ONEIDA Sharps, LCSW 08/03/2023, 12:02 PM

## 2023-08-03 NOTE — Progress Notes (Signed)
 AVS given to patient and explained at the bedside. Medications and follow up appointments have been explained with pt verbalizing understanding.

## 2023-08-03 NOTE — Progress Notes (Signed)
   08/03/23 0859  TOC Brief Assessment  Insurance and Status Reviewed  Patient has primary care physician Yes  Home environment has been reviewed home alone  Prior level of function: independent  Prior/Current Home Services No current home services  Social Drivers of Health Review SDOH reviewed no interventions necessary  Readmission risk has been reviewed Yes  Transition of care needs no transition of care needs at this time

## 2023-08-03 NOTE — Progress Notes (Addendum)
 0111 Requested cough medication.  Contacted A. Andrez, NP.  See new orders  0345 Jhonny Czar, Pharmacist regarding a medication that Patient Parlier stated that she was on that helped with cough, but not listed on her medicine history.  After much discussion with Pharmacist and Patient Ruis, it was determined that the medication that helped her the most, Hydrocodone  Bithomatrop Mbr, was the one that caused her nausea and vomiting per Allergies/Contradictions List.    0410 New order from A. Chavez NP  [phone] Breathing treatment received.

## 2023-08-03 NOTE — Plan of Care (Signed)
  Problem: Coping: Goal: Ability to adjust to condition or change in health will improve Outcome: Progressing   Problem: Metabolic: Goal: Ability to maintain appropriate glucose levels will improve Outcome: Progressing   Problem: Activity: Goal: Ability to tolerate increased activity will improve Outcome: Progressing   Problem: Respiratory: Goal: Levels of oxygenation will improve Outcome: Progressing

## 2023-08-05 ENCOUNTER — Other Ambulatory Visit (HOSPITAL_COMMUNITY): Payer: Self-pay

## 2023-08-05 ENCOUNTER — Ambulatory Visit (HOSPITAL_BASED_OUTPATIENT_CLINIC_OR_DEPARTMENT_OTHER)
Admission: RE | Admit: 2023-08-05 | Discharge: 2023-08-05 | Disposition: A | Discharge status: Home / Self Care | Source: Ambulatory Visit | Attending: Urology | Admitting: Urology

## 2023-08-05 DIAGNOSIS — N1330 Unspecified hydronephrosis: Secondary | ICD-10-CM | POA: Diagnosis not present

## 2023-08-05 DIAGNOSIS — N281 Cyst of kidney, acquired: Secondary | ICD-10-CM | POA: Diagnosis not present

## 2023-08-05 DIAGNOSIS — N200 Calculus of kidney: Secondary | ICD-10-CM | POA: Insufficient documentation

## 2023-08-05 DIAGNOSIS — E1121 Type 2 diabetes mellitus with diabetic nephropathy: Secondary | ICD-10-CM | POA: Diagnosis not present

## 2023-08-05 DIAGNOSIS — E1165 Type 2 diabetes mellitus with hyperglycemia: Secondary | ICD-10-CM | POA: Diagnosis not present

## 2023-08-05 DIAGNOSIS — F325 Major depressive disorder, single episode, in full remission: Secondary | ICD-10-CM | POA: Diagnosis not present

## 2023-08-05 DIAGNOSIS — N183 Chronic kidney disease, stage 3 unspecified: Secondary | ICD-10-CM | POA: Diagnosis not present

## 2023-08-07 LAB — CULTURE, BLOOD (ROUTINE X 2)
Culture: NO GROWTH
Culture: NO GROWTH
Special Requests: ADEQUATE

## 2023-08-14 ENCOUNTER — Ambulatory Visit: Payer: Medicare HMO | Admitting: Internal Medicine

## 2023-08-20 DIAGNOSIS — J9601 Acute respiratory failure with hypoxia: Secondary | ICD-10-CM | POA: Diagnosis not present

## 2023-08-20 DIAGNOSIS — M9983 Other biomechanical lesions of lumbar region: Secondary | ICD-10-CM | POA: Diagnosis not present

## 2023-08-20 DIAGNOSIS — J849 Interstitial pulmonary disease, unspecified: Secondary | ICD-10-CM | POA: Diagnosis not present

## 2023-08-20 DIAGNOSIS — J471 Bronchiectasis with (acute) exacerbation: Secondary | ICD-10-CM | POA: Diagnosis not present

## 2023-08-20 DIAGNOSIS — C3411 Malignant neoplasm of upper lobe, right bronchus or lung: Secondary | ICD-10-CM | POA: Diagnosis not present

## 2023-08-21 ENCOUNTER — Encounter: Payer: Self-pay | Admitting: Urology

## 2023-08-21 ENCOUNTER — Ambulatory Visit (INDEPENDENT_AMBULATORY_CARE_PROVIDER_SITE_OTHER): Admitting: Urology

## 2023-08-21 VITALS — BP 124/76 | HR 78 | Ht 65.0 in | Wt 231.0 lb

## 2023-08-21 DIAGNOSIS — N200 Calculus of kidney: Secondary | ICD-10-CM | POA: Diagnosis not present

## 2023-08-21 DIAGNOSIS — N390 Urinary tract infection, site not specified: Secondary | ICD-10-CM | POA: Diagnosis not present

## 2023-08-21 DIAGNOSIS — Z87440 Personal history of urinary (tract) infections: Secondary | ICD-10-CM | POA: Diagnosis not present

## 2023-08-21 DIAGNOSIS — R8290 Unspecified abnormal findings in urine: Secondary | ICD-10-CM

## 2023-08-21 LAB — URINALYSIS, ROUTINE W REFLEX MICROSCOPIC
Bilirubin, UA: NEGATIVE
Glucose, UA: NEGATIVE
Ketones, UA: NEGATIVE
Nitrite, UA: NEGATIVE
Protein,UA: NEGATIVE
RBC, UA: NEGATIVE
Specific Gravity, UA: 1.015 (ref 1.005–1.030)
Urobilinogen, Ur: 0.2 mg/dL (ref 0.2–1.0)
pH, UA: 6.5 (ref 5.0–7.5)

## 2023-08-21 LAB — MICROSCOPIC EXAMINATION: WBC, UA: >30 /HPF — AB (ref 0–5)
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
## 2023-08-22 ENCOUNTER — Telehealth: Payer: Self-pay

## 2023-08-22 NOTE — Telephone Encounter (Signed)
 Copied from CRM [id number]. Topic: Clinical - Request for Lab/Test Order >> Aug 22, 2023  3:53 PM Leila C wrote: Reason for CRM: Merlynn from Dr. Reena Duck at [facility] physician [phone] just finished her rounds of radiation, does patient need to do CT scan with Dr. Shelah or with Dr. Duck? Please advise and call back.  Called and spoke with Merlynn. Per Dr. Shelah ronco there is no mention of CT scan.  Nfn

## 2023-08-24 LAB — URINE CULTURE

## 2023-08-26 ENCOUNTER — Ambulatory Visit: Payer: Self-pay | Admitting: Urology

## 2023-08-26 DIAGNOSIS — R8290 Unspecified abnormal findings in urine: Secondary | ICD-10-CM

## 2023-08-26 MED ORDER — NITROFURANTOIN MONOHYD MACRO 100 MG PO CAPS: 100.0000 mg | ORAL_CAPSULE | Freq: Two times a day (BID) | ORAL | 0 refills | Status: AC

## 2023-09-02 DIAGNOSIS — Z1211 Encounter for screening for malignant neoplasm of colon: Secondary | ICD-10-CM | POA: Diagnosis not present

## 2023-09-02 DIAGNOSIS — E119 Type 2 diabetes mellitus without complications: Secondary | ICD-10-CM | POA: Diagnosis not present

## 2023-09-03 ENCOUNTER — Other Ambulatory Visit: Payer: Self-pay

## 2023-09-03 ENCOUNTER — Encounter: Payer: Self-pay | Admitting: Internal Medicine

## 2023-09-03 ENCOUNTER — Ambulatory Visit: Admitting: Internal Medicine

## 2023-09-03 VITALS — BP 106/69 | HR 68 | Temp 97.8°F | Ht 65.0 in | Wt 228.0 lb

## 2023-09-03 DIAGNOSIS — Z87440 Personal history of urinary (tract) infections: Secondary | ICD-10-CM | POA: Diagnosis not present

## 2023-09-03 DIAGNOSIS — N390 Urinary tract infection, site not specified: Secondary | ICD-10-CM

## 2023-09-03 NOTE — Patient Instructions (Signed)
 Sign for UTI, burning with urination, abdominal pain, fevers.  Odor and appearance are not indication of UTI.  F/U pRN

## 2023-09-03 NOTE — Progress Notes (Signed)
 Patient: [name]  DOB: 10-10-1955 MRN: [id number] PCP: Verena Mems, MD      Patient Active Problem List   Diagnosis Date Noted   Acute respiratory failure with hypoxia (HCC) 08/02/2023   Thrombocytopenia (HCC) 07/01/2023   Acute hypoxic respiratory failure (HCC) 06/16/2023   Obstructive sleep apnea 06/16/2023   Chronic kidney disease (CKD), stage III (moderate) (HCC) 06/16/2023   GAD (generalized anxiety disorder) 06/16/2023   History of CAD (coronary artery disease) 06/16/2023   Hepatic steatosis 06/16/2023   History of prolonged Q-T interval on ECG 06/16/2023   Chronic diastolic CHF (congestive heart failure) (HCC) 06/16/2023   Overactive bladder 06/16/2023   COPD with acute exacerbation (HCC) 06/05/2023   Cancer of upper lobe of right lung (HCC) 05/31/2023   Pulmonary nodule 1 cm or greater in diameter 04/30/2023   Hydronephrosis with urinary obstruction due to renal calculus 03/29/2023   Chronic cough 06/07/2021   Mediastinal adenopathy 05/08/2021   Migraine 06/23/2020   RUQ pain 06/20/2020   Generalized abdominal pain    Interstitial lung disease (HCC) 11/04/2019   Pulmonary nodules/lesions, multiple 06/05/2019   Common migraine with intractable migraine 06/04/2019   Spondylolisthesis, lumbar region 01/03/2017   Pain in right leg 01/03/2017   Hyperglycemia 04/09/2014   Hypothyroidism 04/09/2014   Hypertension associated with diabetes (HCC) 04/09/2014   Peripheral artery disease (HCC) 11/02/2013   Carotid stenosis 01/08/2013   Left carotid bruit 12/15/2012   Hypertensive urgency 12/02/2012   TIA (transient ischemic attack) 12/02/2012   Headache 12/02/2012   Hypokalemia 02/10/2012   Obesity (BMI 30-39.9) 02/09/2012   AKI (acute kidney injury) (HCC) 02/09/2012   Dehydration 02/09/2012   SBO (small bowel obstruction) (HCC) 02/08/2012   Pleuritic chest pain 12/14/2011   SOB (shortness of breath) 12/14/2011   Hypotension 12/14/2011   ARF (acute renal  failure) (HCC) 12/14/2011   Insulin  dependent type 2 diabetes mellitus (HCC) 12/14/2011   Hyperlipidemia associated with type 2 diabetes mellitus (HCC) 12/14/2011   CAD (coronary artery disease) 12/14/2011   CAP (community acquired pneumonia) 12/14/2011     Subjective:  [name] is a 69 y.o. YF presents for management of recurrent UTI.  Patient was referred by [facility] physicians, Dr. Mems Wolters(PCP)  She was last seen on 12/24 and had burning urinating and was  Rx cefadroxil x 7 days.  She has had multiple course of antibiotics in the past 12 months fro UTI. Antibiotic Hx per record review:  December she was on Keflex. September-Keflex Interim-Levaquin  and Cipro  March Levaquin  and Z-Pak for pneumonia January-cefuroxime She also history of diabetes hypertension hyperlipidemia hypercholesteremia - Urine culture review:  12/24 E. coli, ESBL resistant to ampicillin, cefazolin, cefepime, ceftriaxone , cefuroxime, Bactrim, ciprofloxacin , Levaquin  sensitive to carbapenem and macrobid , augmentin,  Intermediate to cefuroxime - 12/13 urine cultures grew Proteus mirabilis resistant to ampicillin Cipro  Macrobid  Bactrim and tetracycline - 11/05/2022 Staph epidermidis - 8/23 mixed urogenital flora - 06/11/2022 E. coli pansensitive  01/16/2022 E. coli resistant to Bactrim  02/13/22: Doing well.  Only symotoms is urine smells, burns when right before go tot bathroom for a few months. Never seen urology. No hospitalization for UTI. No ssytemic symtoms fo fevers or chills. Burnign not effected by abx.  Today: lithotrypsy and stent exchange of left urtral stone on 04/16/23. Pt states she does not recall the last time she had dyrsura, She thinks she has a UTI due to odor. She has Urine Cx with ecoli and klebsella.  Review of Systems  All other systems reviewed and are negative.   Past Medical History:  Diagnosis Date   Acid reflux    Anxiety    ARF (acute renal failure) (HCC) 12/14/2011   Arthritis     Asthma    CAD (coronary artery disease)    a. Cath 2015 - moderate LAD, diagonal, and OM disease with 100% dRCA with L-R collaterals. b. 2019 nuc normal.   Carotid artery occlusion    Chronic pain following surgery or procedure 2008   Abdominal pain   Common migraine with intractable migraine 06/04/2019   Dehydration 02/09/2012   Diabetes mellitus    Type II   Diabetic hyperosmolar non-ketotic state (HCC) 04/09/2014   Dyspnea    Headache(784.0)    Hepatic steatosis    noted on CT 04/2019   History of kidney stones    Hyperlipidemia    Hypertension    stress test- scheduled for 12/30/2012   Hypothyroidism    Lung cancer (HCC)    PAD (peripheral artery disease) (HCC)    a. presumed by noninvasive testing 2021.   Pneumonia    Prolonged QT interval    Pulmonary nodules/lesions, multiple 06/05/2019   SBO (small bowel obstruction) (HCC) 02/08/2012   Sleep apnea    Uses a Cpap   Stroke (HCC) 11/2012   tingling in L arm , slurred speech- came to ER    Outpatient Medications Prior to Visit  Medication Sig Dispense Refill   albuterol  (PROVENTIL ) (2.5 MG/3ML) 0.083% nebulizer solution Take 3 mLs (2.5 mg total) by nebulization every 2 (two) hours as needed for wheezing or shortness of breath. 75 mL 12   albuterol  (VENTOLIN  HFA) 108 (90 Base) MCG/ACT inhaler Inhale 2 puffs into the lungs every 6 (six) hours as needed for wheezing or shortness of breath. 6.7 g 2   amLODipine  (NORVASC ) 10 MG tablet Take 10 mg by mouth in the morning.     aspirin  EC 81 MG tablet Take 1 tablet (81 mg total) by mouth daily. Swallow whole. 90 tablet 3   atenolol  (TENORMIN ) 100 MG tablet Take 1 tablet (100 mg total) by mouth daily. 90 tablet 3   Cholecalciferol  (VITAMIN D3) 125 MCG (5000 UT) TABS Take 5,000 Units by mouth in the morning.     cilostazol  (PLETAL ) 100 MG tablet Take 1 tablet (100 mg total) by mouth 2 (two) times daily. 180 tablet 3   cyanocobalamin  (VITAMIN B12) 500 MCG tablet Take 500 mcg by  mouth daily.     dicyclomine  (BENTYL ) 10 MG capsule Take 10 mg by mouth 3 (three) times daily before meals.     estradiol  (ESTRACE ) 0.1 MG/GM vaginal cream Apply 3 times weekly by using a pea-sized amount applied to fingertip (Patient taking differently: Place 1 Applicatorful vaginally 3 (three) times a week.) 42.5 g 12   Evolocumab  (REPATHA  SURECLICK) 140 MG/ML SOAJ INJECT 1 PEN INTO THE SKIN EVERY 14 (FOURTEEN) DAYS. 6 mL 1   hydrOXYzine  (ATARAX ) 25 MG tablet Take 1 tablet (25 mg total) by mouth 3 (three) times daily as needed for anxiety. 90 tablet 0   insulin  aspart protamine  - aspart (NOVOLOG  70/30 MIX) (70-30) 100 UNIT/ML FlexPen Inject 40-60 Units into the skin 2 (two) times daily with a meal. Inject 60 units into the skin in the morning and 40 units at bedtime     ipratropium-albuterol  (DUONEB) 0.5-2.5 (3) MG/3ML SOLN Take 3 mLs by nebulization every 8 (eight) hours as needed (for shortness of breathing or wheezing).  levothyroxine  (SYNTHROID ) 100 MCG tablet Take 100 mcg by mouth daily before breakfast.     MOUNJARO 2.5 MG/0.5ML Pen Inject 2.5 mg into the skin once a week.     rosuvastatin  (CRESTOR ) 40 MG tablet Take 1 tablet (40 mg total) by mouth daily. 90 tablet 3   SPIRIVA  RESPIMAT 2.5 MCG/ACT AERS Inhale 2 puffs into the lungs daily.     spironolactone  (ALDACTONE ) 25 MG tablet Take 1 tablet (25 mg total) by mouth daily. (Patient taking differently: Take 25 mg by mouth in the morning.)     vitamin C (ASCORBIC ACID) 250 MG tablet Take 250 mg by mouth daily.     No facility-administered medications prior to visit.     Allergies  Allergen Reactions   Metformin  And Related Other (See Comments)    Damaged patient's kidneys   Strawberry Extract Anaphylaxis   Cefdinir Other (See Comments)    Reaction not named   Lisinopril  Itching, Swelling and Other (See Comments)    Angioedema    Oxycodone  Hcl Nausea And Vomiting   Sulfa Antibiotics Itching, Swelling and Other (See Comments)     Angioedema    Topiramate  Nausea And Vomiting and Other (See Comments)    Dizzy, Blurred Vision   Whole Blood Other (See Comments)    Jehova's witness   Cymbalta [Duloxetine Hcl] Other (See Comments)    Dizzy    Hydrocodone  Bit-Homatrop Mbr Nausea And Vomiting   Lunesta [Eszopiclone] Other (See Comments)    Bad taste in mouth    Penicillins Rash and Other (See Comments)    Paralyzed (per patient) Has patient had a PCN reaction causing immediate rash, facial/tongue/throat swelling, SOB or lightheadedness with hypotension: Yes Has patient had a PCN reaction causing severe rash involving mucus membranes or skin necrosis: No Has patient had a PCN reaction that required hospitalization Yes Has patient had a PCN reaction occurring within the last 10 years: No If all of the above answers are NO, then may proceed with Cephalosporin use.   Tramadol Other (See Comments)    Hallucinations   Trazodone And Nefazodone Other (See Comments)    Shakes     Social History   Tobacco Use   Smoking status: Former    Current packs/day: 0.00    Average packs/day: 0.5 packs/day for 29.0 years (14.5 ttl pk-yrs)    Types: Cigarettes    Start date: 02/06/1972    Quit date: 02/05/2001    Years since quitting: 22.5   Smokeless tobacco: Never  Vaping Use   Vaping status: Never Used  Substance Use Topics   Alcohol use: Not Currently   Drug use: No    Family History  Problem Relation Age of Onset   CAD Brother         X 2   Diabetes Brother    Heart disease Brother        before age 10   Hyperlipidemia Brother    Hypertension Brother    Heart attack Brother    CAD Sister         X 1   Cancer Sister    Diabetes Sister    Heart disease Sister        before age 89   Hyperlipidemia Sister    Hypertension Sister    Hypertension Other        ALL   Diabetes Mother    Heart disease Mother    Hyperlipidemia Mother    Hypertension Mother    Heart  disease Father    Hyperlipidemia Father     Hypertension Father    Hypertension Daughter    Asthma Other    Stroke Other     Objective:  There were no vitals filed for this visit. There is no height or weight on file to calculate BMI.  Physical Exam Constitutional:      Appearance: Normal appearance.  HENT:     Head: Normocephalic and atraumatic.     Right Ear: Tympanic membrane normal.     Left Ear: Tympanic membrane normal.     Nose: Nose normal.     Mouth/Throat:     Mouth: Mucous membranes are moist.  Eyes:     Extraocular Movements: Extraocular movements intact.     Conjunctiva/sclera: Conjunctivae normal.     Pupils: Pupils are equal, round, and reactive to light.  Cardiovascular:     Rate and Rhythm: Normal rate and regular rhythm.     Heart sounds: No murmur heard.    No friction rub. No gallop.  Pulmonary:     Effort: Pulmonary effort is normal.     Breath sounds: Normal breath sounds.  Abdominal:     General: Abdomen is flat.     Palpations: Abdomen is soft.  Musculoskeletal:        General: Normal range of motion.  Skin:    General: Skin is warm and dry.  Neurological:     Mental Status: She is alert.  Psychiatric:        Mood and Affect: Mood normal.     Lab Results: Lab Results  Component Value Date   WBC 7.4 08/03/2023   HGB 12.8 08/03/2023   HCT 40.8 08/03/2023   MCV 87.7 08/03/2023   PLT 279 08/03/2023    Lab Results  Component Value Date   CREATININE 0.94 08/03/2023   BUN 12 08/03/2023   NA 133 (L) 08/03/2023   K 4.3 08/03/2023   CL 103 08/03/2023   CO2 21 (L) 08/03/2023    Lab Results  Component Value Date   ALT 11 08/03/2023   AST 19 08/03/2023   ALKPHOS 94 08/03/2023   BILITOT 0.7 08/03/2023     Assessment & Plan:   #Hx of recurrent UTI #Urine Cx+ ESBL Ecoli in December S macrobid , augmentin #Penicillin allergy listed, tolerated Augmentin per record review -Pt states she has multiple UTIs over the years. Her symptoms generally include maloderous urine. She states  she has dysuria x 2 months. Multiple round of abx, She denies fever, chills, hospitalization for UTI. Has not seen by Urology. -I dicussed that color or odor of urine does equate to a UTI. Reviewed symptoms of UTI as abdominal pain, dysuria, increase urinary frequency+/1 fever chills,  Update 09/03/23: -Referred to Urology for anatomic work/up. She underwent lithotrypsy and stent placement on 04/16/23 with Urology Dr. Shona. Also, on preventative measures with estrogen cream, methenamine  + Vitamin C, urinary probiotics -Since last visit she has been treated for UTIs. Reviewed Urine cx last visit: - 08/21/23 ESBL E. coli - 4/16 E. coli - 04/11/23 Klebsiella oxytoca Macrobid  resistant 03/29/23 ESBL E. coli and Klebsiella oxytoca 02/26/2023 Klebsiella oxytoca Patient states that she has not had dysuria/abdominal pain with UTIs at least since last visit.  Does look like she has had multiple rounds of macrobid  x 7 days since last visit. She states that she gets urine cultures done when she has malodorous urine.  I discussed with her and her daughter extensively that order in appearance the  urine are not indicative for UTI and do not require treatment for UTI.  Reviewed UTI symptoms: Abdominal pain, dysuria, increased urinary frequency plus minus fevers or chills.  Hold off on prophylactic antibiotics as it does not seem that she has had a true UTI more consistent with colonization - Continue to follow with urology - Follow-up with ID as needed  Loney Stank, MD [facility]   09/03/23  12:46 PM I have personally spent 60 minutes involved in face-to-face and non-face-to-face activities for this patient on the day of the visit. Professional time spent includes the following activities: Preparing to see the patient (review of tests), Obtaining and/or reviewing separately obtained history (admission/discharge record), Performing a medically appropriate  examination and/or evaluation , Ordering medications/tests/procedures, referring and communicating with other health care professionals, Documenting clinical information in the EMR, Independently interpreting results (not separately reported), Communicating results to the patient/family/caregiver, Counseling and educating the patient/family/caregiver and Care coordination (not separately reported).

## 2023-09-04 DIAGNOSIS — R0789 Other chest pain: Secondary | ICD-10-CM | POA: Diagnosis not present

## 2023-09-04 DIAGNOSIS — C4492 Squamous cell carcinoma of skin, unspecified: Secondary | ICD-10-CM | POA: Diagnosis not present

## 2023-09-05 DIAGNOSIS — F325 Major depressive disorder, single episode, in full remission: Secondary | ICD-10-CM | POA: Diagnosis not present

## 2023-09-05 DIAGNOSIS — R0789 Other chest pain: Secondary | ICD-10-CM | POA: Diagnosis not present

## 2023-09-05 DIAGNOSIS — N183 Chronic kidney disease, stage 3 unspecified: Secondary | ICD-10-CM | POA: Diagnosis not present

## 2023-09-05 DIAGNOSIS — E1140 Type 2 diabetes mellitus with diabetic neuropathy, unspecified: Secondary | ICD-10-CM | POA: Diagnosis not present

## 2023-09-05 DIAGNOSIS — E1121 Type 2 diabetes mellitus with diabetic nephropathy: Secondary | ICD-10-CM | POA: Diagnosis not present

## 2023-09-05 DIAGNOSIS — J439 Emphysema, unspecified: Secondary | ICD-10-CM | POA: Diagnosis not present

## 2023-09-05 DIAGNOSIS — E1165 Type 2 diabetes mellitus with hyperglycemia: Secondary | ICD-10-CM | POA: Diagnosis not present

## 2023-09-05 DIAGNOSIS — I701 Atherosclerosis of renal artery: Secondary | ICD-10-CM | POA: Diagnosis not present

## 2023-09-07 DIAGNOSIS — E1165 Type 2 diabetes mellitus with hyperglycemia: Secondary | ICD-10-CM | POA: Diagnosis not present

## 2023-09-11 ENCOUNTER — Other Ambulatory Visit: Payer: Self-pay | Admitting: Nurse Practitioner

## 2023-09-11 DIAGNOSIS — K7581 Nonalcoholic steatohepatitis (NASH): Secondary | ICD-10-CM | POA: Diagnosis not present

## 2023-09-11 DIAGNOSIS — K7469 Other cirrhosis of liver: Secondary | ICD-10-CM

## 2023-09-12 NOTE — Progress Notes (Unsigned)
 Navigator reached out to the pt to make self introduction and assist with scheduling the pt's chest CT that her radiation oncology provider would like her to have. Navigator offered to schedule the scan for her. Pt agreed.  Navigator was able to schedule pt's CT for 09/23/23 at [facility] at 4:30. Navigator called pt to provide the details of the date/time/location and that she needs to arrive at [facility] at 4:15. Pt states that her daughter will be able to get her to the CT scan appt.

## 2023-09-13 DIAGNOSIS — F418 Other specified anxiety disorders: Secondary | ICD-10-CM | POA: Diagnosis not present

## 2023-09-13 DIAGNOSIS — R062 Wheezing: Secondary | ICD-10-CM | POA: Diagnosis not present

## 2023-09-13 DIAGNOSIS — C3491 Malignant neoplasm of unspecified part of right bronchus or lung: Secondary | ICD-10-CM | POA: Diagnosis not present

## 2023-09-13 DIAGNOSIS — E1121 Type 2 diabetes mellitus with diabetic nephropathy: Secondary | ICD-10-CM | POA: Diagnosis not present

## 2023-09-14 ENCOUNTER — Other Ambulatory Visit: Payer: Self-pay | Admitting: Cardiovascular Disease

## 2023-09-14 DIAGNOSIS — I739 Peripheral vascular disease, unspecified: Secondary | ICD-10-CM

## 2023-09-14 DIAGNOSIS — E785 Hyperlipidemia, unspecified: Secondary | ICD-10-CM

## 2023-09-14 DIAGNOSIS — I2510 Atherosclerotic heart disease of native coronary artery without angina pectoris: Secondary | ICD-10-CM

## 2023-09-17 ENCOUNTER — Other Ambulatory Visit: Payer: Self-pay

## 2023-09-17 ENCOUNTER — Emergency Department (HOSPITAL_COMMUNITY)

## 2023-09-17 ENCOUNTER — Inpatient Hospital Stay (HOSPITAL_COMMUNITY)
Admission: EM | Admit: 2023-09-17 | Discharge: 2023-09-21 | DRG: 189 | Disposition: A | Discharge status: Home / Self Care | Attending: Internal Medicine | Admitting: Internal Medicine

## 2023-09-17 ENCOUNTER — Encounter (HOSPITAL_COMMUNITY): Payer: Self-pay | Admitting: Emergency Medicine

## 2023-09-17 DIAGNOSIS — E66812 Obesity, class 2: Secondary | ICD-10-CM | POA: Diagnosis present

## 2023-09-17 DIAGNOSIS — E785 Hyperlipidemia, unspecified: Secondary | ICD-10-CM | POA: Diagnosis present

## 2023-09-17 DIAGNOSIS — Z79890 Hormone replacement therapy (postmenopausal): Secondary | ICD-10-CM | POA: Diagnosis not present

## 2023-09-17 DIAGNOSIS — I2510 Atherosclerotic heart disease of native coronary artery without angina pectoris: Secondary | ICD-10-CM | POA: Diagnosis present

## 2023-09-17 DIAGNOSIS — Z7982 Long term (current) use of aspirin: Secondary | ICD-10-CM

## 2023-09-17 DIAGNOSIS — E119 Type 2 diabetes mellitus without complications: Secondary | ICD-10-CM

## 2023-09-17 DIAGNOSIS — E1159 Type 2 diabetes mellitus with other circulatory complications: Secondary | ICD-10-CM | POA: Diagnosis present

## 2023-09-17 DIAGNOSIS — Z794 Long term (current) use of insulin: Secondary | ICD-10-CM

## 2023-09-17 DIAGNOSIS — C3411 Malignant neoplasm of upper lobe, right bronchus or lung: Secondary | ICD-10-CM | POA: Diagnosis present

## 2023-09-17 DIAGNOSIS — D696 Thrombocytopenia, unspecified: Secondary | ICD-10-CM | POA: Diagnosis not present

## 2023-09-17 DIAGNOSIS — Z7902 Long term (current) use of antithrombotics/antiplatelets: Secondary | ICD-10-CM

## 2023-09-17 DIAGNOSIS — K760 Fatty (change of) liver, not elsewhere classified: Secondary | ICD-10-CM | POA: Diagnosis present

## 2023-09-17 DIAGNOSIS — Z833 Family history of diabetes mellitus: Secondary | ICD-10-CM

## 2023-09-17 DIAGNOSIS — N1831 Chronic kidney disease, stage 3a: Secondary | ICD-10-CM | POA: Diagnosis present

## 2023-09-17 DIAGNOSIS — Z8673 Personal history of transient ischemic attack (TIA), and cerebral infarction without residual deficits: Secondary | ICD-10-CM

## 2023-09-17 DIAGNOSIS — Z1152 Encounter for screening for COVID-19: Secondary | ICD-10-CM | POA: Diagnosis not present

## 2023-09-17 DIAGNOSIS — E1151 Type 2 diabetes mellitus with diabetic peripheral angiopathy without gangrene: Secondary | ICD-10-CM | POA: Diagnosis present

## 2023-09-17 DIAGNOSIS — C801 Malignant (primary) neoplasm, unspecified: Secondary | ICD-10-CM | POA: Diagnosis not present

## 2023-09-17 DIAGNOSIS — I701 Atherosclerosis of renal artery: Secondary | ICD-10-CM | POA: Diagnosis not present

## 2023-09-17 DIAGNOSIS — Z79818 Long term (current) use of other agents affecting estrogen receptors and estrogen levels: Secondary | ICD-10-CM

## 2023-09-17 DIAGNOSIS — Z923 Personal history of irradiation: Secondary | ICD-10-CM

## 2023-09-17 DIAGNOSIS — E039 Hypothyroidism, unspecified: Secondary | ICD-10-CM | POA: Diagnosis present

## 2023-09-17 DIAGNOSIS — J9601 Acute respiratory failure with hypoxia: Secondary | ICD-10-CM | POA: Diagnosis not present

## 2023-09-17 DIAGNOSIS — E1121 Type 2 diabetes mellitus with diabetic nephropathy: Secondary | ICD-10-CM | POA: Diagnosis not present

## 2023-09-17 DIAGNOSIS — Z6837 Body mass index (BMI) 37.0-37.9, adult: Secondary | ICD-10-CM

## 2023-09-17 DIAGNOSIS — E1140 Type 2 diabetes mellitus with diabetic neuropathy, unspecified: Secondary | ICD-10-CM | POA: Diagnosis not present

## 2023-09-17 DIAGNOSIS — I5032 Chronic diastolic (congestive) heart failure: Secondary | ICD-10-CM | POA: Diagnosis present

## 2023-09-17 DIAGNOSIS — J449 Chronic obstructive pulmonary disease, unspecified: Secondary | ICD-10-CM | POA: Diagnosis present

## 2023-09-17 DIAGNOSIS — Z888 Allergy status to other drugs, medicaments and biological substances status: Secondary | ICD-10-CM

## 2023-09-17 DIAGNOSIS — E1165 Type 2 diabetes mellitus with hyperglycemia: Secondary | ICD-10-CM | POA: Diagnosis present

## 2023-09-17 DIAGNOSIS — Z7985 Long-term (current) use of injectable non-insulin antidiabetic drugs: Secondary | ICD-10-CM

## 2023-09-17 DIAGNOSIS — Z881 Allergy status to other antibiotic agents status: Secondary | ICD-10-CM

## 2023-09-17 DIAGNOSIS — E1122 Type 2 diabetes mellitus with diabetic chronic kidney disease: Secondary | ICD-10-CM | POA: Diagnosis not present

## 2023-09-17 DIAGNOSIS — Z880 Allergy status to penicillin: Secondary | ICD-10-CM

## 2023-09-17 DIAGNOSIS — J441 Chronic obstructive pulmonary disease with (acute) exacerbation: Principal | ICD-10-CM | POA: Diagnosis present

## 2023-09-17 DIAGNOSIS — F419 Anxiety disorder, unspecified: Secondary | ICD-10-CM | POA: Diagnosis present

## 2023-09-17 DIAGNOSIS — Z8249 Family history of ischemic heart disease and other diseases of the circulatory system: Secondary | ICD-10-CM

## 2023-09-17 DIAGNOSIS — G4730 Sleep apnea, unspecified: Secondary | ICD-10-CM | POA: Diagnosis present

## 2023-09-17 DIAGNOSIS — R0902 Hypoxemia: Secondary | ICD-10-CM | POA: Diagnosis not present

## 2023-09-17 DIAGNOSIS — Z809 Family history of malignant neoplasm, unspecified: Secondary | ICD-10-CM

## 2023-09-17 DIAGNOSIS — C3490 Malignant neoplasm of unspecified part of unspecified bronchus or lung: Secondary | ICD-10-CM | POA: Diagnosis not present

## 2023-09-17 DIAGNOSIS — Z87891 Personal history of nicotine dependence: Secondary | ICD-10-CM

## 2023-09-17 DIAGNOSIS — Z79899 Other long term (current) drug therapy: Secondary | ICD-10-CM

## 2023-09-17 DIAGNOSIS — R0602 Shortness of breath: Secondary | ICD-10-CM | POA: Diagnosis not present

## 2023-09-17 DIAGNOSIS — I152 Hypertension secondary to endocrine disorders: Secondary | ICD-10-CM | POA: Diagnosis not present

## 2023-09-17 DIAGNOSIS — Z9102 Food additives allergy status: Secondary | ICD-10-CM

## 2023-09-17 DIAGNOSIS — Z83438 Family history of other disorder of lipoprotein metabolism and other lipidemia: Secondary | ICD-10-CM

## 2023-09-17 DIAGNOSIS — J439 Emphysema, unspecified: Secondary | ICD-10-CM | POA: Diagnosis not present

## 2023-09-17 DIAGNOSIS — Z882 Allergy status to sulfonamides status: Secondary | ICD-10-CM

## 2023-09-17 DIAGNOSIS — Z823 Family history of stroke: Secondary | ICD-10-CM

## 2023-09-17 DIAGNOSIS — Z885 Allergy status to narcotic agent status: Secondary | ICD-10-CM

## 2023-09-17 DIAGNOSIS — Z825 Family history of asthma and other chronic lower respiratory diseases: Secondary | ICD-10-CM

## 2023-09-17 LAB — BASIC METABOLIC PANEL WITH GFR
Anion gap: 10 (ref 5–15)
BUN: 10 mg/dL (ref 8–23)
CO2: 25 mmol/L (ref 22–32)
Calcium: 9.5 mg/dL (ref 8.9–10.3)
Chloride: 99 mmol/L (ref 98–111)
Creatinine, Ser: 1.07 mg/dL — ABNORMAL HIGH (ref 0.44–1.00)
GFR, Estimated: 56 mL/min — ABNORMAL LOW (ref >60)
Glucose, Bld: 185 mg/dL — ABNORMAL HIGH (ref 70–99)
Potassium: 3.6 mmol/L (ref 3.5–5.1)
Sodium: 134 mmol/L — ABNORMAL LOW (ref 135–145)

## 2023-09-17 LAB — BLOOD GAS, ARTERIAL
Acid-Base Excess: 1.2 mmol/L (ref 0.0–2.0)
Bicarbonate: 27.6 mmol/L (ref 20.0–28.0)
Drawn by: 56037
O2 Content: 6 L/min
O2 Saturation: 100 %
Patient temperature: 36.7
pCO2 arterial: 49 mmHg — ABNORMAL HIGH (ref 32–48)
pH, Arterial: 7.35 (ref 7.35–7.45)
pO2, Arterial: 199 mmHg — ABNORMAL HIGH (ref 83–108)

## 2023-09-17 LAB — RESP PANEL BY RT-PCR (RSV, FLU A&B, COVID)  RVPGX2
Influenza A by PCR: NEGATIVE
Influenza B by PCR: NEGATIVE
Resp Syncytial Virus by PCR: NEGATIVE
SARS Coronavirus 2 by RT PCR: NEGATIVE

## 2023-09-17 LAB — CBC
HCT: 45.1 % (ref 36.0–46.0)
Hemoglobin: 14.3 g/dL (ref 12.0–15.0)
MCH: 26.9 pg (ref 26.0–34.0)
MCHC: 31.7 g/dL (ref 30.0–36.0)
MCV: 84.9 fL (ref 80.0–100.0)
Platelets: 273 K/uL (ref 150–400)
RBC: 5.31 MIL/uL — ABNORMAL HIGH (ref 3.87–5.11)
RDW: 13.7 % (ref 11.5–15.5)
WBC: 5.5 K/uL (ref 4.0–10.5)
nRBC: 0 % (ref 0.0–0.2)

## 2023-09-17 LAB — GLUCOSE, CAPILLARY
Glucose-Capillary: 281 mg/dL — ABNORMAL HIGH (ref 70–99)
Glucose-Capillary: 319 mg/dL — ABNORMAL HIGH (ref 70–99)
Glucose-Capillary: 319 mg/dL — ABNORMAL HIGH (ref 70–99)

## 2023-09-17 LAB — BRAIN NATRIURETIC PEPTIDE: B Natriuretic Peptide: 22 pg/mL (ref 0.0–100.0)

## 2023-09-17 LAB — TROPONIN I (HIGH SENSITIVITY)
Troponin I (High Sensitivity): 6 ng/L (ref <18)
Troponin I (High Sensitivity): 20 ng/L — ABNORMAL HIGH (ref <18)

## 2023-09-17 LAB — HIV ANTIBODY (ROUTINE TESTING W REFLEX): HIV Screen 4th Generation wRfx: NONREACTIVE

## 2023-09-17 MED ORDER — TIOTROPIUM BROMIDE MONOHYDRATE 2.5 MCG/ACT IN AERS: 2.0000 | INHALATION_SPRAY | Freq: Every day | RESPIRATORY_TRACT | Status: DC

## 2023-09-17 MED ORDER — SALINE SPRAY 0.65 % NA SOLN
1.0000 | NASAL | Status: DC | PRN
  Administered 2023-09-20: 1 via NASAL
  Filled 2023-09-17: qty 44

## 2023-09-17 MED ORDER — DOXYCYCLINE HYCLATE 100 MG PO TABS
100.0000 mg | ORAL_TABLET | Freq: Two times a day (BID) | ORAL | Status: DC
  Administered 2023-09-17 – 2023-09-21 (×13): 100 mg via ORAL
  Filled 2023-09-17 (×9): qty 1

## 2023-09-17 MED ORDER — ROSUVASTATIN CALCIUM 10 MG PO TABS
40.0000 mg | ORAL_TABLET | Freq: Every day | ORAL | Status: DC
  Administered 2023-09-17 – 2023-09-21 (×7): 40 mg via ORAL
  Filled 2023-09-17 (×5): qty 4

## 2023-09-17 MED ORDER — HYDROCORTISONE (PERIANAL) 2.5 % EX CREA: 1.0000 | TOPICAL_CREAM | Freq: Four times a day (QID) | CUTANEOUS | Status: DC | PRN

## 2023-09-17 MED ORDER — ALUM & MAG HYDROXIDE-SIMETH 200-200-20 MG/5ML PO SUSP: 30.0000 mL | ORAL | Status: DC | PRN

## 2023-09-17 MED ORDER — PHENOL 1.4 % MT LIQD: 1.0000 | OROMUCOSAL | Status: DC | PRN

## 2023-09-17 MED ORDER — DICLOFENAC SODIUM 1 % EX GEL
1.0000 | Freq: Two times a day (BID) | CUTANEOUS | Status: DC
  Administered 2023-09-18 – 2023-09-21 (×8): 1 via TOPICAL
  Filled 2023-09-17: qty 100

## 2023-09-17 MED ORDER — SERTRALINE HCL 50 MG PO TABS
50.0000 mg | ORAL_TABLET | Freq: Every day | ORAL | Status: DC
  Administered 2023-09-17 – 2023-09-21 (×7): 50 mg via ORAL
  Filled 2023-09-17 (×5): qty 1

## 2023-09-17 MED ORDER — INSULIN ASPART 100 UNIT/ML IJ SOLN
0.0000 [IU] | Freq: Every day | INTRAMUSCULAR | Status: DC
  Administered 2023-09-17 (×2): 4 [IU] via SUBCUTANEOUS
  Administered 2023-09-19: 5 [IU] via SUBCUTANEOUS
  Administered 2023-09-20: 4 [IU] via SUBCUTANEOUS

## 2023-09-17 MED ORDER — ALBUTEROL SULFATE (2.5 MG/3ML) 0.083% IN NEBU
7.5000 mg/h | INHALATION_SOLUTION | Freq: Once | RESPIRATORY_TRACT | Status: AC
  Administered 2023-09-17 (×2): 7.5 mg/h via RESPIRATORY_TRACT
  Filled 2023-09-17: qty 9

## 2023-09-17 MED ORDER — FENTANYL CITRATE PF 50 MCG/ML IJ SOSY
25.0000 ug | PREFILLED_SYRINGE | Freq: Once | INTRAMUSCULAR | Status: AC
  Administered 2023-09-17 (×2): 25 ug via INTRAVENOUS
  Filled 2023-09-17: qty 1

## 2023-09-17 MED ORDER — ENSURE MAX PROTEIN PO LIQD
11.0000 [oz_av] | Freq: Every day | ORAL | Status: DC
  Administered 2023-09-17 – 2023-09-21 (×5): 11 [oz_av] via ORAL

## 2023-09-17 MED ORDER — ACETAMINOPHEN 325 MG PO TABS
650.0000 mg | ORAL_TABLET | Freq: Four times a day (QID) | ORAL | Status: DC | PRN
  Administered 2023-09-18 (×2): 650 mg via ORAL
  Filled 2023-09-17 (×2): qty 2

## 2023-09-17 MED ORDER — BUDESONIDE 0.5 MG/2ML IN SUSP
2.0000 mg | Freq: Two times a day (BID) | RESPIRATORY_TRACT | Status: DC
  Administered 2023-09-17 (×4): 0.5 mg via RESPIRATORY_TRACT
  Administered 2023-09-18 (×4): 2 mg via RESPIRATORY_TRACT
  Administered 2023-09-19: 0.5 mg via RESPIRATORY_TRACT
  Filled 2023-09-17 (×5): qty 8

## 2023-09-17 MED ORDER — DM-GUAIFENESIN ER 30-600 MG PO TB12
1.0000 | ORAL_TABLET | Freq: Two times a day (BID) | ORAL | Status: DC
  Administered 2023-09-17 – 2023-09-21 (×11): 1 via ORAL
  Filled 2023-09-17 (×8): qty 1

## 2023-09-17 MED ORDER — CILOSTAZOL 100 MG PO TABS
100.0000 mg | ORAL_TABLET | Freq: Two times a day (BID) | ORAL | Status: DC
  Administered 2023-09-17 – 2023-09-21 (×11): 100 mg via ORAL
  Filled 2023-09-17 (×8): qty 1

## 2023-09-17 MED ORDER — IPRATROPIUM-ALBUTEROL 0.5-2.5 (3) MG/3ML IN SOLN
3.0000 mL | Freq: Four times a day (QID) | RESPIRATORY_TRACT | Status: DC
  Administered 2023-09-17 – 2023-09-21 (×21): 3 mL via RESPIRATORY_TRACT
  Filled 2023-09-17 (×17): qty 3

## 2023-09-17 MED ORDER — CLONAZEPAM 0.5 MG PO TABS
0.5000 mg | ORAL_TABLET | Freq: Two times a day (BID) | ORAL | Status: DC | PRN
  Administered 2023-09-18 (×2): 0.5 mg via ORAL
  Filled 2023-09-17: qty 1

## 2023-09-17 MED ORDER — ALBUTEROL SULFATE (2.5 MG/3ML) 0.083% IN NEBU: 5.0000 mg | INHALATION_SOLUTION | Freq: Once | RESPIRATORY_TRACT | Status: DC

## 2023-09-17 MED ORDER — HYDROCORTISONE 1 % EX CREA: 1.0000 | TOPICAL_CREAM | Freq: Three times a day (TID) | CUTANEOUS | Status: DC | PRN

## 2023-09-17 MED ORDER — MUSCLE RUB 10-15 % EX CREA
1.0000 | TOPICAL_CREAM | CUTANEOUS | Status: DC | PRN
  Administered 2023-09-21: 1 via TOPICAL
  Filled 2023-09-17: qty 85

## 2023-09-17 MED ORDER — LEVOTHYROXINE SODIUM 100 MCG PO TABS
100.0000 ug | ORAL_TABLET | Freq: Every day | ORAL | Status: DC
  Administered 2023-09-18 – 2023-09-21 (×5): 100 ug via ORAL
  Filled 2023-09-17 (×4): qty 1

## 2023-09-17 MED ORDER — INSULIN ASPART 100 UNIT/ML IJ SOLN
0.0000 [IU] | Freq: Three times a day (TID) | INTRAMUSCULAR | Status: DC
  Administered 2023-09-17: 4 [IU] via SUBCUTANEOUS
  Administered 2023-09-17 (×2): 3 [IU] via SUBCUTANEOUS
  Administered 2023-09-17: 4 [IU] via SUBCUTANEOUS
  Administered 2023-09-18: 5 [IU] via SUBCUTANEOUS
  Administered 2023-09-18: 4 [IU] via SUBCUTANEOUS
  Administered 2023-09-18 (×2): 3 [IU] via SUBCUTANEOUS
  Administered 2023-09-18: 4 [IU] via SUBCUTANEOUS
  Administered 2023-09-18: 5 [IU] via SUBCUTANEOUS
  Administered 2023-09-19: 4 [IU] via SUBCUTANEOUS
  Administered 2023-09-19 (×2): 5 [IU] via SUBCUTANEOUS
  Administered 2023-09-20 (×2): 2 [IU] via SUBCUTANEOUS
  Administered 2023-09-20: 4 [IU] via SUBCUTANEOUS
  Administered 2023-09-21: 1 [IU] via SUBCUTANEOUS
  Administered 2023-09-21: 5 [IU] via SUBCUTANEOUS

## 2023-09-17 MED ORDER — ENOXAPARIN SODIUM 40 MG/0.4ML IJ SOSY
40.0000 mg | PREFILLED_SYRINGE | INTRAMUSCULAR | Status: DC
  Administered 2023-09-17 – 2023-09-20 (×6): 40 mg via SUBCUTANEOUS
  Filled 2023-09-17 (×4): qty 0.4

## 2023-09-17 MED ORDER — POLYVINYL ALCOHOL 1.4 % OP SOLN: 1.0000 [drp] | OPHTHALMIC | Status: DC | PRN

## 2023-09-17 MED ORDER — PREDNISONE 20 MG PO TABS
20.0000 mg | ORAL_TABLET | Freq: Two times a day (BID) | ORAL | Status: DC
  Administered 2023-09-17 – 2023-09-18 (×4): 20 mg via ORAL
  Filled 2023-09-17 (×2): qty 1

## 2023-09-17 MED ORDER — IPRATROPIUM-ALBUTEROL 0.5-2.5 (3) MG/3ML IN SOLN: 3.0000 mL | RESPIRATORY_TRACT | Status: DC | PRN

## 2023-09-17 MED ORDER — ALBUTEROL SULFATE (2.5 MG/3ML) 0.083% IN NEBU
5.0000 mg | INHALATION_SOLUTION | Freq: Once | RESPIRATORY_TRACT | Status: AC
  Administered 2023-09-17 (×2): 5 mg via RESPIRATORY_TRACT
  Filled 2023-09-17: qty 6

## 2023-09-17 MED ORDER — ATENOLOL 25 MG PO TABS
100.0000 mg | ORAL_TABLET | Freq: Every day | ORAL | Status: DC
  Administered 2023-09-17 – 2023-09-21 (×7): 100 mg via ORAL
  Filled 2023-09-17 (×5): qty 4

## 2023-09-17 MED ORDER — DICYCLOMINE HCL 10 MG PO CAPS
10.0000 mg | ORAL_CAPSULE | Freq: Three times a day (TID) | ORAL | Status: DC
  Administered 2023-09-17 – 2023-09-21 (×16): 10 mg via ORAL
  Filled 2023-09-17 (×12): qty 1

## 2023-09-17 MED ORDER — HYDROXYZINE HCL 25 MG PO TABS
25.0000 mg | ORAL_TABLET | Freq: Three times a day (TID) | ORAL | Status: DC | PRN
  Filled 2023-09-17: qty 1

## 2023-09-17 MED ORDER — METHYLPREDNISOLONE SODIUM SUCC 125 MG IJ SOLR
125.0000 mg | INTRAMUSCULAR | Status: AC
  Administered 2023-09-17 (×2): 125 mg via INTRAVENOUS
  Filled 2023-09-17: qty 2

## 2023-09-17 MED ORDER — UMECLIDINIUM BROMIDE 62.5 MCG/ACT IN AEPB
1.0000 | INHALATION_SPRAY | Freq: Every day | RESPIRATORY_TRACT | Status: DC
  Administered 2023-09-18 – 2023-09-21 (×5): 1 via RESPIRATORY_TRACT
  Filled 2023-09-17: qty 7

## 2023-09-17 MED ORDER — AMLODIPINE BESYLATE 10 MG PO TABS
10.0000 mg | ORAL_TABLET | Freq: Every day | ORAL | Status: DC
  Administered 2023-09-17 – 2023-09-21 (×7): 10 mg via ORAL
  Filled 2023-09-17 (×5): qty 1

## 2023-09-17 MED ORDER — SPIRONOLACTONE 25 MG PO TABS
25.0000 mg | ORAL_TABLET | Freq: Every day | ORAL | Status: DC
  Administered 2023-09-17 – 2023-09-21 (×7): 25 mg via ORAL
  Filled 2023-09-17 (×5): qty 1

## 2023-09-17 MED ORDER — SODIUM CHLORIDE 0.9 % IV SOLN
500.0000 mg | Freq: Once | INTRAVENOUS | Status: AC
  Administered 2023-09-17 (×2): 500 mg via INTRAVENOUS
  Filled 2023-09-17: qty 5

## 2023-09-17 MED ORDER — IPRATROPIUM BROMIDE 0.02 % IN SOLN
0.5000 mg | Freq: Once | RESPIRATORY_TRACT | Status: AC
  Administered 2023-09-17 (×2): 0.5 mg via RESPIRATORY_TRACT
  Filled 2023-09-17: qty 2.5

## 2023-09-17 MED ORDER — LORATADINE 10 MG PO TABS: 10.0000 mg | ORAL_TABLET | Freq: Every day | ORAL | Status: DC | PRN
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
## 2023-09-17 NOTE — Progress Notes (Signed)
 Initial Nutrition Assessment  DOCUMENTATION CODES:   Obesity unspecified  INTERVENTION: - Heart Healthy/Carb Modified diet per MD.  - Ensure Max po daily, provides 150 kcal and 30 grams of protein.    NUTRITION DIAGNOSIS:   Increased nutrient needs related to acute illness as evidenced by estimated needs.  GOAL:   Patient will meet greater than or equal to 90% of their needs  MONITOR:   PO intake, Supplement acceptance, Weight trends  REASON FOR ASSESSMENT:   Consult Assessment of nutrition requirement/status  ASSESSMENT:   68 y.o. female with PMH COPD, CKD 3, DM2, hepatic steatosis, HF, CAD who presented with SOB. Admitted for COPD exacerbation.   Patient reports a UBW of 225# and that she has lost a little weight over the past few months since starting Mounjaro. Per chart review, weight has varied from 215-237# over the past year with no significant changes.    Patient endorses typically consuming 3 meals a day but since starting Mourjaro she reports only eating 1 meal a day. Cooks this meal at home. She reports taking vitamin D , E, and C. Per her home meds list, she also takes B12.  Her current appetite is normal and she reports wanting lunch. Went over ordering process for meals with patient.  Encouraged small and frequent meals throughout the day to combat getting winded from COPD. Encouraged intake of protein rich food sources at all meals/snacks. Patient endorses understanding. She is hesitantly agreeable to try ONS during admission. She is noted to have a strawberry extract allergy.    Medications reviewed and include: -  Labs reviewed:  Na 134 Creatinine 1.07 HA1C 7.7   NUTRITION - FOCUSED PHYSICAL EXAM:  Flowsheet Row Most Recent Value  Orbital Region No depletion  Upper Arm Region No depletion  Thoracic and Lumbar Region No depletion  Buccal Region No depletion  Temple Region No depletion  Clavicle Bone Region No depletion  Clavicle and Acromion  Bone Region No depletion  Scapular Bone Region Unable to assess  Dorsal Hand No depletion  Patellar Region No depletion  Anterior Thigh Region No depletion  Posterior Calf Region No depletion  Edema (RD Assessment) None  Hair Reviewed  Eyes Reviewed  Mouth Reviewed  Skin Reviewed  Nails Reviewed    Diet Order:   Diet Order             Diet heart healthy/carb modified Room service appropriate? Yes; Fluid consistency: Thin  Diet effective now                   EDUCATION NEEDS:  Education needs have been addressed  Skin:  Skin Assessment: Reviewed RN Assessment  Last BM:  unknown  Height:  Ht Readings from Last 1 Encounters:  09/17/23 5' 5 (1.651 m)   Weight:  Wt Readings from Last 1 Encounters:  09/17/23 103.4 kg   Ideal Body Weight:  56.82 kg  BMI:  Body mass index is 37.94 kg/m.  Estimated Nutritional Needs:  Kcal:  1900-2100 kcals Protein:  85-100 grams Fluid:  >/= 1.9L    Trude Ned RD, LDN Contact via Secure Chat.

## 2023-09-17 NOTE — TOC Initial Note (Signed)
 Transition of Care [facility]) - Initial/Assessment Note    Patient Details  Name: [name] MRN: [id number] Date of Birth: Mar 13, 1955  Transition of Care [facility]) CM/SW Contact:    Doneta Glenys DASEN, RN Phone Number: 09/17/2023, 12:11 PM  Clinical Narrative:                 Presented for SOB. CM introduced and explained role. Patient states PTA lives in an apartment alone; Verified PCP/insurance;DME-cane,walker, WC & CPAP not sure with agency; denies HH, oxygen, or SDOH needs; Patient states son will transport home at discharge Patient felt it necessary to inform me she is Jehovah Witness.  CM called [facility] and [facility] to see if patient received CPAP from them since patient stated that she thought the agency started with an A. IP case management will follow progression to discharge for oxygen needs..  Expected Discharge Plan: Home/Self Care Barriers to Discharge: Continued Medical Work up   Patient Goals and CMS Choice Patient states their goals for this hospitalization and ongoing recovery are:: Home with self CMS Medicare.gov Compare Post Acute Care list provided to:: Other (Comment Required) (NA) Choice offered to / list presented to : NA  ownership interest in [facility].provided to:: Parent NA    Expected Discharge Plan and Services In-house Referral: NA Discharge Planning Services: CM Consult Post Acute Care Choice: NA Living arrangements for the past 2 months: Apartment                 DME Arranged: N/A DME Agency: NA       HH Arranged: NA HH Agency: NA        Prior Living Arrangements/Services Living arrangements for the past 2 months: Apartment Lives with:: Self Patient language and need for interpreter reviewed:: Yes Do you feel safe going back to the place where you live?: Yes      Need for Family Participation in Patient Care: No (Comment) Care giver support system in place?: Yes (comment) Current home services: DME (CPAP w/ ? ,  cane,walker, WC) Criminal Activity/Legal Involvement Pertinent to Current Situation/Hospitalization: No - Comment as needed  Activities of Daily Living   ADL Screening (condition at time of admission) Independently performs ADLs?: Yes (appropriate for developmental age) Is the patient deaf or have difficulty hearing?: No Does the patient have difficulty seeing, even when wearing glasses/contacts?: No Does the patient have difficulty concentrating, remembering, or making decisions?: No  Permission Sought/Granted Permission sought to share information with : Case Manager Permission granted to share information with : Yes, Verbal Permission Granted  Share Information with NAME: Sherida Rake son [phone]           Emotional Assessment Appearance:: Appears stated age Attitude/Demeanor/Rapport: Engaged Affect (typically observed): Appropriate Orientation: : Oriented to Self, Oriented to Place, Oriented to  Time, Oriented to Situation Alcohol  / Substance Use: Not Applicable Psych Involvement: No (comment)  Admission diagnosis:  Hypoxia [R09.02] COPD exacerbation (HCC) [J44.1] Acute respiratory failure with hypoxia (HCC) [J96.01] Patient Active Problem List   Diagnosis Date Noted   Acute respiratory failure with hypoxia (HCC) 08/02/2023   Thrombocytopenia (HCC) 07/01/2023   Acute hypoxic respiratory failure (HCC) 06/16/2023   Obstructive sleep apnea 06/16/2023   Chronic kidney disease (CKD), stage III (moderate) (HCC) 06/16/2023   GAD (generalized anxiety disorder) 06/16/2023   History of CAD (coronary artery disease) 06/16/2023   Hepatic steatosis 06/16/2023   History of prolonged Q-T interval on ECG 06/16/2023   Chronic diastolic CHF (congestive heart  failure) (HCC) 06/16/2023   Overactive bladder 06/16/2023   COPD with acute exacerbation (HCC) 06/05/2023   Cancer of upper lobe of right lung (HCC) 05/31/2023   Pulmonary nodule 1 cm or greater in diameter 04/30/2023    Hydronephrosis with urinary obstruction due to renal calculus 03/29/2023   Chronic cough 06/07/2021   Mediastinal adenopathy 05/08/2021   Migraine 06/23/2020   RUQ pain 06/20/2020   Generalized abdominal pain    Interstitial lung disease (HCC) 11/04/2019   Pulmonary nodules/lesions, multiple 06/05/2019   Common migraine with intractable migraine 06/04/2019   Spondylolisthesis, lumbar region 01/03/2017   Pain in right leg 01/03/2017   Hyperglycemia 04/09/2014   Hypothyroidism 04/09/2014   Hypertension associated with diabetes (HCC) 04/09/2014   Peripheral artery disease (HCC) 11/02/2013   Carotid stenosis 01/08/2013   Left carotid bruit 12/15/2012   Hypertensive urgency 12/02/2012   TIA (transient ischemic attack) 12/02/2012   Headache 12/02/2012   Hypokalemia 02/10/2012   Obesity (BMI 30-39.9) 02/09/2012   AKI (acute kidney injury) (HCC) 02/09/2012   Dehydration 02/09/2012   SBO (small bowel obstruction) (HCC) 02/08/2012   Pleuritic chest pain 12/14/2011   SOB (shortness of breath) 12/14/2011   Hypotension 12/14/2011   ARF (acute renal failure) (HCC) 12/14/2011   Insulin  dependent type 2 diabetes mellitus (HCC) 12/14/2011   Hyperlipidemia associated with type 2 diabetes mellitus (HCC) 12/14/2011   CAD (coronary artery disease) 12/14/2011   CAP (community acquired pneumonia) 12/14/2011   PCP:  Verena Mems, MD Pharmacy:   [facility] [phone] [place], [place] - [address] [place] [address] Phone: [phone] Fax: [phone]  [facility] - [place], [place] - [address] [place] [address] Phone: [phone] Fax: [phone]  [facility] [address] [place] [address] Phone: [phone] Fax: [phone]  [id number] [place], [place] - [address] AT [facility] [address] [place]  [address] Phone: [phone] Fax: [phone]  [facility] [address] [place] [address] Phone: [phone] Fax: [phone]     Social Drivers of Health (SDOH) Social History: SDOH Screenings   Food Insecurity: No Food Insecurity (09/17/2023)  Housing: Low Risk  (09/17/2023)  Transportation Needs: No Transportation Needs (09/17/2023)  Utilities: Not At Risk (09/17/2023)  Depression (PHQ2-9): Low Risk  (11/29/2018)  Financial Resource Strain: Low Risk  (02/07/2022)   Received from [facility]  Social Connections: Unknown (09/17/2023)  Stress: No Stress Concern Present (02/07/2022)   Received from [facility]  Tobacco Use: Medium Risk (09/17/2023)   SDOH Interventions:     Readmission Risk Interventions    09/17/2023   11:55 AM 06/17/2023   12:04 PM  Readmission Risk Prevention Plan  Transportation Screening Complete Complete  PCP or Specialist Appt within 5-7 Days  Complete  PCP or Specialist Appt within 3-5 Days Complete   Home Care Screening  Complete  Medication Review (RN CM)  Complete  Palliative Care Screening Not Applicable   Medication Review (RN Care Manager) Complete

## 2023-09-17 NOTE — ED Provider Notes (Signed)
 [facility] EMERGENCY DEPARTMENT AT [facility] Provider Note   CSN: [id number] Arrival date & time: 09/17/23  9690     Patient presents with: Shortness of Breath   [name] is a 68 y.o. female.   The history is provided by the patient.  Shortness of Breath Severity:  Moderate Onset quality:  Gradual Duration:  1 day Timing:  Constant Progression:  Unchanged Chronicity:  Recurrent Relieved by:  Nothing Worsened by:  Nothing Ineffective treatments:  Inhaler Associated symptoms: cough and wheezing   Associated symptoms: no chest pain and no fever   Risk factors: no recent surgery   Patient with COPD not on home O2 presents with SOB, cough and wheezing not responsive to home medication.  No fevers.      Past Medical History:  Diagnosis Date   Acid reflux    Anxiety    ARF (acute renal failure) (HCC) 12/14/2011   Arthritis    Asthma    CAD (coronary artery disease)    a. Cath 2015 - moderate LAD, diagonal, and OM disease with 100% dRCA with L-R collaterals. b. 2019 nuc normal.   Carotid artery occlusion    Chronic pain following surgery or procedure 2008   Abdominal pain   Common migraine with intractable migraine 06/04/2019   Dehydration 02/09/2012   Diabetes mellitus    Type II   Diabetic hyperosmolar non-ketotic state (HCC) 04/09/2014   Dyspnea    Headache(784.0)    Hepatic steatosis    noted on CT 04/2019   History of kidney stones    Hyperlipidemia    Hypertension    stress test- scheduled for 12/30/2012   Hypothyroidism    Lung cancer (HCC)    PAD (peripheral artery disease) (HCC)    a. presumed by noninvasive testing 2021.   Pneumonia    Prolonged QT interval    Pulmonary nodules/lesions, multiple 06/05/2019   SBO (small bowel obstruction) (HCC) 02/08/2012   Sleep apnea    Uses a Cpap   Stroke (HCC) 11/2012   tingling in L arm , slurred speech- came to ER     Prior to Admission medications   Medication Sig Start Date End Date  Taking? Authorizing Provider  albuterol  (PROVENTIL ) (2.5 MG/3ML) 0.083% nebulizer solution Take 3 mLs (2.5 mg total) by nebulization every 2 (two) hours as needed for wheezing or shortness of breath. 06/18/23   Barbarann Nest, MD  albuterol  (VENTOLIN  HFA) 108 [phone] Base) MCG/ACT inhaler Inhale 2 puffs into the lungs every 6 (six) hours as needed for wheezing or shortness of breath. 08/03/23   Cindy Garnette POUR, MD  amLODipine  (NORVASC ) 10 MG tablet Take 10 mg by mouth in the morning.    [provider]  aspirin  EC 81 MG tablet Take 1 tablet (81 mg total) by mouth daily. Swallow whole. 10/13/20   Dunn, Dayna N, PA-C  atenolol  (TENORMIN ) 100 MG tablet Take 1 tablet (100 mg total) by mouth daily. 12/18/22   Delford Maude BROCKS, MD  Cholecalciferol  (VITAMIN D3) 125 MCG (5000 UT) TABS Take 5,000 Units by mouth in the morning.    [provider]  cilostazol  (PLETAL ) 100 MG tablet Take 1 tablet (100 mg total) by mouth 2 (two) times daily. 04/16/23   Delford Maude BROCKS, MD  cyanocobalamin  (VITAMIN B12) 500 MCG tablet Take 500 mcg by mouth daily.    [provider]  dicyclomine  (BENTYL ) 10 MG capsule Take 10 mg by mouth 3 (three) times daily before meals.  [provider]  estradiol  (ESTRACE ) 0.1 MG/GM vaginal cream Apply 3 times weekly by using a pea-sized amount applied to fingertip 04/23/23   Shona Layman BROCKS, MD  Evolocumab  (REPATHA  SURECLICK) 140 MG/ML SOAJ INJECT 1 PEN INTO THE SKIN EVERY 14 (FOURTEEN) DAYS. 09/16/23   Darron Deatrice LABOR, MD  hydrOXYzine  (ATARAX ) 25 MG tablet Take 1 tablet (25 mg total) by mouth 3 (three) times daily as needed for anxiety. 08/03/23   Cindy Garnette POUR, MD  insulin  aspart protamine  - aspart (NOVOLOG  70/30 MIX) (70-30) 100 UNIT/ML FlexPen Inject 40-60 Units into the skin 2 (two) times daily with a meal. Inject 60 units into the skin in the morning and 40 units at bedtime 05/16/16   [provider]  ipratropium-albuterol  (DUONEB) 0.5-2.5 (3) MG/3ML  SOLN Take 3 mLs by nebulization every 8 (eight) hours as needed (for shortness of breathing or wheezing).    [provider]  levothyroxine  (SYNTHROID ) 100 MCG tablet Take 100 mcg by mouth daily before breakfast.    [provider]  MOUNJARO 2.5 MG/0.5ML Pen Inject 2.5 mg into the skin once a week. 07/02/23   [provider]  rosuvastatin  (CRESTOR ) 40 MG tablet Take 1 tablet (40 mg total) by mouth daily. 06/23/20 09/03/23  Pokhrel, Laxman, MD  SPIRIVA  RESPIMAT 2.5 MCG/ACT AERS Inhale 2 puffs into the lungs daily.    [provider]  spironolactone  (ALDACTONE ) 25 MG tablet Take 1 tablet (25 mg total) by mouth daily. 04/03/23   Singh, Prashant K, MD  vitamin C (ASCORBIC ACID) 250 MG tablet Take 250 mg by mouth daily.    [provider]    Allergies: Metformin  and related, Strawberry extract, Cefdinir, Lisinopril , Oxycodone  hcl, Sulfa antibiotics, Topiramate , Whole blood, Cymbalta [duloxetine hcl], Hydrocodone  bit-homatrop mbr, Lunesta [eszopiclone], Penicillins, Tramadol, and Trazodone and nefazodone    Review of Systems  Constitutional:  Negative for fever.  HENT:  Negative for facial swelling.   Eyes:  Negative for redness.  Respiratory:  Positive for cough, shortness of breath and wheezing.   Cardiovascular:  Negative for chest pain and leg swelling.  All other systems reviewed and are negative.   Updated Vital Signs BP 128/61   Pulse 88   Temp 98 F (36.7 C)   Resp (!) 23   SpO2 93%   Physical Exam Vitals and nursing note reviewed.  Constitutional:      General: She is not in acute distress.    Appearance: Normal appearance. She is well-developed.  HENT:     Head: Normocephalic and atraumatic.     Nose: Nose normal.  Eyes:     Pupils: Pupils are equal, round, and reactive to light.  Cardiovascular:     Rate and Rhythm: Normal rate and regular rhythm.     Pulses: Normal pulses.     Heart sounds: Normal heart sounds.  Pulmonary:      Effort: Pulmonary effort is normal. No respiratory distress.     Breath sounds: No stridor. Wheezing present.  Abdominal:     General: Bowel sounds are normal. There is no distension.     Palpations: Abdomen is soft.     Tenderness: There is no abdominal tenderness. There is no guarding or rebound.  Musculoskeletal:        General: Normal range of motion.     Cervical back: Neck supple.  Skin:    General: Skin is warm and dry.     Capillary Refill: Capillary refill takes less than 2 seconds.  Findings: No erythema or rash.  Neurological:     General: No focal deficit present.     Mental Status: She is alert.     Deep Tendon Reflexes: Reflexes normal.  Psychiatric:        Mood and Affect: Mood normal.     (all labs ordered are listed, but only abnormal results are displayed) Results for orders placed or performed during the hospital encounter of 09/17/23  Resp panel by RT-PCR (RSV, Flu A&B, Covid) Anterior Nasal Swab   Collection Time: 09/17/23  3:22 AM   Specimen: Anterior Nasal Swab  Result Value Ref Range   SARS Coronavirus 2 by RT PCR NEGATIVE NEGATIVE   Influenza A by PCR NEGATIVE NEGATIVE   Influenza B by PCR NEGATIVE NEGATIVE   Resp Syncytial Virus by PCR NEGATIVE NEGATIVE  Basic metabolic panel   Collection Time: 09/17/23  3:23 AM  Result Value Ref Range   Sodium 134 (L) 135 - 145 mmol/L   Potassium 3.6 3.5 - 5.1 mmol/L   Chloride 99 98 - 111 mmol/L   CO2 25 22 - 32 mmol/L   Glucose, Bld 185 (H) 70 - 99 mg/dL   BUN 10 8 - 23 mg/dL   Creatinine, Ser 8.92 (H) 0.44 - 1.00 mg/dL   Calcium  9.5 8.9 - 10.3 mg/dL   GFR, Estimated 56 (L) >60 mL/min   Anion gap 10 5 - 15  CBC   Collection Time: 09/17/23  3:23 AM  Result Value Ref Range   WBC 5.5 4.0 - 10.5 K/uL   RBC 5.31 (H) 3.87 - 5.11 MIL/uL   Hemoglobin 14.3 12.0 - 15.0 g/dL   HCT 54.8 63.9 - 53.9 %   MCV 84.9 80.0 - 100.0 fL   MCH 26.9 26.0 - 34.0 pg   MCHC 31.7 30.0 - 36.0 g/dL   RDW 86.2 88.4 - 84.4 %    Platelets 273 150 - 400 K/uL   nRBC 0.0 0.0 - 0.2 %  Brain natriuretic peptide   Collection Time: 09/17/23  3:23 AM  Result Value Ref Range   B Natriuretic Peptide 22.0 0.0 - 100.0 pg/mL  Troponin I (High Sensitivity)   Collection Time: 09/17/23  3:23 AM  Result Value Ref Range   Troponin I (High Sensitivity) 6 <18 ng/L   No results found.   EKG: EKG Interpretation Date/Time:  Tuesday September 17 2023 03:18:32 EDT Ventricular Rate:  87 PR Interval:  151 QRS Duration:  100 QT Interval:  384 QTC Calculation: 462 R Axis:   62  Text Interpretation: Sinus rhythm Confirmed by Nettie, Aldon Hengst ([id number]) on 09/17/2023 5:26:31 AM  Radiology: No results found.   .Critical Care  Performed by: Nettie Earing, MD Authorized by: Nettie Earing, MD   Critical care provider statement:    Critical care time (minutes):  30   Critical care end time:  09/17/2023 5:28 AM   Critical care was necessary to treat or prevent imminent or life-threatening deterioration of the following conditions:  Respiratory failure   Critical care was time spent personally by me on the following activities:  Development of treatment plan with patient or surrogate, discussions with consultants, evaluation of patient's response to treatment, examination of patient, ordering and review of laboratory studies, ordering and review of radiographic studies, ordering and performing treatments and interventions, pulse oximetry, re-evaluation of patient's condition and review of old charts    Medications Ordered in the ED  methylPREDNISolone  sodium succinate (SOLU-MEDROL ) 125 mg/2 mL injection 125 mg (has  no administration in time range)  azithromycin  (ZITHROMAX ) 500 mg in sodium chloride  0.9 % 250 mL IVPB (has no administration in time range)  albuterol  (PROVENTIL ,VENTOLIN ) solution continuous neb (has no administration in time range)  albuterol  (PROVENTIL ) (2.5 MG/3ML) 0.083% nebulizer solution 5 mg (5 mg Nebulization Given  09/17/23 0519)  ipratropium (ATROVENT ) nebulizer solution 0.5 mg (0.5 mg Nebulization Given 09/17/23 0519)                                    Medical Decision Making Recurrent SOb and wheezing not controlled with home medication   Amount and/or Complexity of Data Reviewed Independent Historian:     Details: Son seen above  External Data Reviewed: notes.    Details: Previous notes reviewed  Labs: ordered.    Details: Negative covid and flu. Sodium 134, normal potassium 3.6, creatinine slight elevation 1.07, normal white count 5.5, normal hemoglobin 14.3, normal creatinine  Radiology: ordered and independent interpretation performed.    Details: Cephalization by me  ECG/medicine tests: ordered and independent interpretation performed. Decision-making details documented in ED Course.  Risk Prescription drug management. Decision regarding hospitalization.     Final diagnoses:  COPD exacerbation (HCC)  Hypoxia   The patient appears reasonably stabilized for admission considering the current resources, flow, and capabilities available in the ED at this time, and I doubt any other [facility] requiring further screening and/or treatment in the ED prior to admission.  ED Discharge Orders     None          Lorina Duffner, MD 09/17/23 9464

## 2023-09-17 NOTE — Evaluation (Signed)
 Physical Therapy Evaluation Patient Details Name: [name] MRN: [id number] DOB: 1955/04/07 Today's Date: 09/17/2023  History of Present Illness  68 y.o. female admitted 09/17/23 with worsening respiratory status, wheezing, hypoxia. PMH: allergies , history recurrent UTI multidrug-resistant Klebsiella, CAD, arthritis, DM, HTN, PAD, SBO, CVA, ureteral stone, heart failure, luung cancer,  Clinical Impression  Pt admitted with above diagnosis.  Pt currently with functional limitations due to the deficits listed below (see PT Problem List). Pt will benefit from acute skilled PT to increase their independence and safety with mobility to allow discharge.     Patient is up  ambulating  in room, nit using a device , cruising on room objects. Patient will benefit from PT to improve tolerance to activity and monitor O2 saturation.   Patient's son currently lives with patient but works. Patient was independent PTA and should progress to return.      If plan is discharge home, recommend the following: Help with stairs or ramp for entrance;Assist for transportation;Assistance with cooking/housework   Can travel by private vehicle        Equipment Recommendations None recommended by PT  Recommendations for Other Services       Functional Status Assessment Patient has had a recent decline in their functional status and demonstrates the ability to make significant improvements in function in a reasonable and predictable amount of time.     Precautions / Restrictions Precautions Precautions: Fall Precaution/Restrictions Comments: monitor sats Restrictions Weight Bearing Restrictions Per Provider Order: No      Mobility  Bed Mobility Overal bed mobility: Independent                  Transfers Overall transfer level: Independent                      Ambulation/Gait Ambulation/Gait assistance: Supervision Gait Distance (Feet): 20 Feet (x2)   Gait Pattern/deviations:  Step-through pattern, Drifts right/left Gait velocity: decr     General Gait Details: reliant  on  surfaces for support( bed, door knob, counter) strong coughing spell after ambulating  Stairs            Wheelchair Mobility     Tilt Bed    Modified Rankin (Stroke Patients Only)       Balance Overall balance assessment: Needs assistance Sitting-balance support: Feet supported, No upper extremity supported Sitting balance-Leahy Scale: Good     Standing balance support: During functional activity, No upper extremity supported Standing balance-Leahy Scale: Fair                               Pertinent Vitals/Pain Pain Assessment Pain Assessment: Faces Faces Pain Scale: Hurts whole lot Pain Location: lower abd from coughing Pain Descriptors / Indicators: Jabbing, Grimacing Pain Intervention(s): Monitored during session, Patient requesting pain meds-RN notified    Home Living Family/patient expects to be discharged to:: Private residence Living Arrangements: Children Available Help at Discharge: Family;Available PRN/intermittently Type of Home: Apartment Home Access: Level entry       Home Layout: One level Home Equipment: Cane - single point;Rollator (4 wheels) Additional Comments: handicapped apt    Prior Function Prior Level of Function : Independent/Modified Independent             Mobility Comments: Ambulates with cane, denies recent falls., some times uses rollator or dtr pushes her seated on rollator ADLs Comments: Ind with ADLs, states she does not  drive.     Extremity/Trunk Assessment   Upper Extremity Assessment Upper Extremity Assessment: Overall WFL for tasks assessed    Lower Extremity Assessment Lower Extremity Assessment: Overall WFL for tasks assessed    Cervical / Trunk Assessment Cervical / Trunk Assessment: Normal  Communication   Communication Communication: No apparent difficulties    Cognition Arousal:  Alert Behavior During Therapy: WFL for tasks assessed/performed   PT - Cognitive impairments: No apparent impairments                         Following commands: Intact       Cueing       General Comments      Exercises     Assessment/Plan    PT Assessment Patient needs continued PT services  PT Problem List Decreased strength;Decreased activity tolerance;Decreased safety awareness;Decreased mobility;Cardiopulmonary status limiting activity       PT Treatment Interventions      PT Goals (Current goals can be found in the Care Plan section)  Acute Rehab PT Goals Patient Stated Goal: not cough, walk PT Goal Formulation: With patient Time For Goal Achievement: 10/01/23 Potential to Achieve Goals: Good    Frequency Min 2X/week     Co-evaluation               AM-PAC PT 6 Clicks Mobility  Outcome Measure Help needed turning from your back to your side while in a flat bed without using bedrails?: None Help needed moving from lying on your back to sitting on the side of a flat bed without using bedrails?: None Help needed moving to and from a bed to a chair (including a wheelchair)?: None Help needed standing up from a chair using your arms (e.g., wheelchair or bedside chair)?: None Help needed to walk in hospital room?: None Help needed climbing 3-5 steps with a railing? : A Little 6 Click Score: 23    End of Session Equipment Utilized During Treatment: Oxygen Activity Tolerance: Patient limited by fatigue Patient left: in bed;with call bell/phone within reach Nurse Communication: Mobility status PT Visit Diagnosis: Unsteadiness on feet (R26.81);Difficulty in walking, not elsewhere classified (R26.2)    Time: 8379-8354 PT Time Calculation (min) (ACUTE ONLY): 25 min   Charges:   PT Evaluation $PT Eval Low Complexity: 1 Low PT Treatments $Gait Training: 8-22 mins PT General Charges $$ ACUTE PT VISIT: 1 Visit         [name]  PT Acute Rehabilitation Services Office [phone]   Potters Darice Norris 09/17/2023, 5:02 PM

## 2023-09-17 NOTE — ED Provider Notes (Signed)
 [facility] DEPARTMENT AT [facility] Provider Note   CSN: [id number] Arrival date & time: 09/17/23  9690     Patient presents with: Shortness of Breath   [name] is a 68 y.o. female.   The history is provided by the patient.  Shortness of Breath Severity:  Moderate Onset quality:  Gradual Duration:  1 day Timing:  Constant Progression:  Unchanged Chronicity:  Recurrent Relieved by:  Nothing Worsened by:  Nothing Ineffective treatments:  Inhaler Associated symptoms: cough and wheezing   Associated symptoms: no chest pain and no fever   Risk factors: no recent surgery   Patient with COPD not on home O2 presents with SOB, cough and wheezing not responsive to home medication.  No fevers.      Past Medical History:  Diagnosis Date   Acid reflux    Anxiety    ARF (acute renal failure) (HCC) 12/14/2011   Arthritis    Asthma    CAD (coronary artery disease)    a. Cath 2015 - moderate LAD, diagonal, and OM disease with 100% dRCA with L-R collaterals. b. 2019 nuc normal.   Carotid artery occlusion    Chronic pain following surgery or procedure 2008   Abdominal pain   Common migraine with intractable migraine 06/04/2019   Dehydration 02/09/2012   Diabetes mellitus    Type II   Diabetic hyperosmolar non-ketotic state (HCC) 04/09/2014   Dyspnea    Headache(784.0)    Hepatic steatosis    noted on CT 04/2019   History of kidney stones    Hyperlipidemia    Hypertension    stress test- scheduled for 12/30/2012   Hypothyroidism    Lung cancer (HCC)    PAD (peripheral artery disease) (HCC)    a. presumed by noninvasive testing 2021.   Pneumonia    Prolonged QT interval    Pulmonary nodules/lesions, multiple 06/05/2019   SBO (small bowel obstruction) (HCC) 02/08/2012   Sleep apnea    Uses a Cpap   Stroke (HCC) 11/2012   tingling in L arm , slurred speech- came to ER     Prior to Admission medications   Medication Sig Start Date End Date  Taking? Authorizing Provider  albuterol  (PROVENTIL ) (2.5 MG/3ML) 0.083% nebulizer solution Take 3 mLs (2.5 mg total) by nebulization every 2 (two) hours as needed for wheezing or shortness of breath. 06/18/23   Barbarann Nest, MD  albuterol  (VENTOLIN  HFA) [phone] Base) MCG/ACT inhaler Inhale 2 puffs into the lungs every 6 (six) hours as needed for wheezing or shortness of breath. 08/03/23   Cindy Garnette POUR, MD  amLODipine  (NORVASC ) 10 MG tablet Take 10 mg by mouth in the morning.    [provider]  aspirin  EC 81 MG tablet Take 1 tablet (81 mg total) by mouth daily. Swallow whole. 10/13/20   Dunn, Dayna N, PA-C  atenolol  (TENORMIN ) 100 MG tablet Take 1 tablet (100 mg total) by mouth daily. 12/18/22   Delford Maude BROCKS, MD  Cholecalciferol  (VITAMIN D3) 125 MCG (5000 UT) TABS Take 5,000 Units by mouth in the morning.    [provider]  cilostazol  (PLETAL ) 100 MG tablet Take 1 tablet (100 mg total) by mouth 2 (two) times daily. 04/16/23   Delford Maude BROCKS, MD  cyanocobalamin  (VITAMIN B12) 500 MCG tablet Take 500 mcg by mouth daily.    [provider]  dicyclomine  (BENTYL ) 10 MG capsule Take 10 mg by mouth 3 (three) times daily before meals.  [provider]  estradiol  (ESTRACE ) 0.1 MG/GM vaginal cream Apply 3 times weekly by using a pea-sized amount applied to fingertip 04/23/23   Shona Layman BROCKS, MD  Evolocumab  (REPATHA  SURECLICK) 140 MG/ML SOAJ INJECT 1 PEN INTO THE SKIN EVERY 14 (FOURTEEN) DAYS. 09/16/23   Darron Deatrice LABOR, MD  hydrOXYzine  (ATARAX ) 25 MG tablet Take 1 tablet (25 mg total) by mouth 3 (three) times daily as needed for anxiety. 08/03/23   Cindy Garnette POUR, MD  insulin  aspart protamine  - aspart (NOVOLOG  70/30 MIX) (70-30) 100 UNIT/ML FlexPen Inject 40-60 Units into the skin 2 (two) times daily with a meal. Inject 60 units into the skin in the morning and 40 units at bedtime 05/16/16   [provider]  ipratropium-albuterol  (DUONEB) 0.5-2.5 (3) MG/3ML  SOLN Take 3 mLs by nebulization every 8 (eight) hours as needed (for shortness of breathing or wheezing).    [provider]  levothyroxine  (SYNTHROID ) 100 MCG tablet Take 100 mcg by mouth daily before breakfast.    [provider]  MOUNJARO 2.5 MG/0.5ML Pen Inject 2.5 mg into the skin once a week. 07/02/23   [provider]  rosuvastatin  (CRESTOR ) 40 MG tablet Take 1 tablet (40 mg total) by mouth daily. 06/23/20 09/03/23  Pokhrel, Laxman, MD  SPIRIVA  RESPIMAT 2.5 MCG/ACT AERS Inhale 2 puffs into the lungs daily.    [provider]  spironolactone  (ALDACTONE ) 25 MG tablet Take 1 tablet (25 mg total) by mouth daily. 04/03/23   Singh, Prashant K, MD  vitamin C (ASCORBIC ACID) 250 MG tablet Take 250 mg by mouth daily.    [provider]    Allergies: Metformin  and related, Strawberry extract, Cefdinir, Lisinopril , Oxycodone  hcl, Sulfa antibiotics, Topiramate , Whole blood, Cymbalta [duloxetine hcl], Hydrocodone  bit-homatrop mbr, Lunesta [eszopiclone], Penicillins, Tramadol, and Trazodone and nefazodone    Review of Systems  Constitutional:  Negative for fever.  HENT:  Negative for facial swelling.   Eyes:  Negative for redness.  Respiratory:  Positive for cough, shortness of breath and wheezing.   Cardiovascular:  Negative for chest pain and leg swelling.  All other systems reviewed and are negative.   Updated Vital Signs BP 128/61   Pulse 88   Temp 98 F (36.7 C)   Resp (!) 23   SpO2 93%   Physical Exam Vitals and nursing note reviewed.  Constitutional:      General: She is not in acute distress.    Appearance: Normal appearance. She is well-developed.  HENT:     Head: Normocephalic and atraumatic.     Nose: Nose normal.  Eyes:     Pupils: Pupils are equal, round, and reactive to light.  Cardiovascular:     Rate and Rhythm: Normal rate and regular rhythm.     Pulses: Normal pulses.     Heart sounds: Normal heart sounds.  Pulmonary:      Effort: Pulmonary effort is normal. No respiratory distress.     Breath sounds: No stridor. Wheezing present.  Abdominal:     General: Bowel sounds are normal. There is no distension.     Palpations: Abdomen is soft.     Tenderness: There is no abdominal tenderness. There is no guarding or rebound.  Musculoskeletal:        General: Normal range of motion.     Cervical back: Neck supple.  Skin:    General: Skin is warm and dry.     Capillary Refill: Capillary refill takes less than 2 seconds.  Findings: No erythema or rash.  Neurological:     General: No focal deficit present.     Mental Status: She is alert.     Deep Tendon Reflexes: Reflexes normal.  Psychiatric:        Mood and Affect: Mood normal.     (all labs ordered are listed, but only abnormal results are displayed) Results for orders placed or performed during the hospital encounter of 09/17/23  Resp panel by RT-PCR (RSV, Flu A&B, Covid) Anterior Nasal Swab   Collection Time: 09/17/23  3:22 AM   Specimen: Anterior Nasal Swab  Result Value Ref Range   SARS Coronavirus 2 by RT PCR NEGATIVE NEGATIVE   Influenza A by PCR NEGATIVE NEGATIVE   Influenza B by PCR NEGATIVE NEGATIVE   Resp Syncytial Virus by PCR NEGATIVE NEGATIVE  Basic metabolic panel   Collection Time: 09/17/23  3:23 AM  Result Value Ref Range   Sodium 134 (L) 135 - 145 mmol/L   Potassium 3.6 3.5 - 5.1 mmol/L   Chloride 99 98 - 111 mmol/L   CO2 25 22 - 32 mmol/L   Glucose, Bld 185 (H) 70 - 99 mg/dL   BUN 10 8 - 23 mg/dL   Creatinine, Ser 8.92 (H) 0.44 - 1.00 mg/dL   Calcium  9.5 8.9 - 10.3 mg/dL   GFR, Estimated 56 (L) >60 mL/min   Anion gap 10 5 - 15  CBC   Collection Time: 09/17/23  3:23 AM  Result Value Ref Range   WBC 5.5 4.0 - 10.5 K/uL   RBC 5.31 (H) 3.87 - 5.11 MIL/uL   Hemoglobin 14.3 12.0 - 15.0 g/dL   HCT 54.8 63.9 - 53.9 %   MCV 84.9 80.0 - 100.0 fL   MCH 26.9 26.0 - 34.0 pg   MCHC 31.7 30.0 - 36.0 g/dL   RDW 86.2 88.4 - 84.4 %    Platelets 273 150 - 400 K/uL   nRBC 0.0 0.0 - 0.2 %  Brain natriuretic peptide   Collection Time: 09/17/23  3:23 AM  Result Value Ref Range   B Natriuretic Peptide 22.0 0.0 - 100.0 pg/mL  Troponin I (High Sensitivity)   Collection Time: 09/17/23  3:23 AM  Result Value Ref Range   Troponin I (High Sensitivity) 6 <18 ng/L   No results found.   EKG: EKG Interpretation Date/Time:  Tuesday September 17 2023 03:18:32 EDT Ventricular Rate:  87 PR Interval:  151 QRS Duration:  100 QT Interval:  384 QTC Calculation: 462 R Axis:   62  Text Interpretation: Sinus rhythm Confirmed by Nettie, Tayleigh Wetherell ([id number]) on 09/17/2023 5:26:31 AM  Radiology: No results found.   .Critical Care  Performed by: Nettie Earing, MD Authorized by: Nettie Earing, MD   Critical care provider statement:    Critical care time (minutes):  30   Critical care end time:  09/17/2023 5:28 AM   Critical care was necessary to treat or prevent imminent or life-threatening deterioration of the following conditions:  Respiratory failure   Critical care was time spent personally by me on the following activities:  Development of treatment plan with patient or surrogate, discussions with consultants, evaluation of patient's response to treatment, examination of patient, ordering and review of laboratory studies, ordering and review of radiographic studies, ordering and performing treatments and interventions, pulse oximetry, re-evaluation of patient's condition and review of old charts    Medications Ordered in the ED  methylPREDNISolone  sodium succinate (SOLU-MEDROL ) 125 mg/2 mL injection 125 mg (has  no administration in time range)  azithromycin  (ZITHROMAX ) 500 mg in sodium chloride  0.9 % 250 mL IVPB (has no administration in time range)  albuterol  (PROVENTIL ,VENTOLIN ) solution continuous neb (has no administration in time range)  albuterol  (PROVENTIL ) (2.5 MG/3ML) 0.083% nebulizer solution 5 mg (5 mg Nebulization Given  09/17/23 0519)  ipratropium (ATROVENT ) nebulizer solution 0.5 mg (0.5 mg Nebulization Given 09/17/23 0519)                                    Medical Decision Making Recurrent SOb and wheezing not controlled with home medication   Amount and/or Complexity of Data Reviewed Independent Historian:     Details: Son seen above  External Data Reviewed: notes.    Details: Previous notes reviewed  Labs: ordered.    Details: Negative covid and flu. Sodium 134, normal potassium 3.6, creatinine slight elevation 1.07, normal white count 5.5, normal hemoglobin 14.3, normal creatinine  Radiology: ordered and independent interpretation performed.    Details: Cephalization by me  ECG/medicine tests: ordered and independent interpretation performed. Decision-making details documented in ED Course.  Risk Prescription drug management. Decision regarding hospitalization.     Final diagnoses:  COPD exacerbation (HCC)  Hypoxia   The patient appears reasonably stabilized for admission considering the current resources, flow, and capabilities available in the ED at this time, and I doubt any other [facility] requiring further screening and/or treatment in the ED prior to admission.  ED Discharge Orders     None          Uel Davidow, MD 09/17/23 9464

## 2023-09-17 NOTE — ED Provider Notes (Signed)
 Spoke to Dr. Alfornia, day team to admit    Amanda Brodzinski, MD 09/17/23 [phone]

## 2023-09-17 NOTE — ED Triage Notes (Signed)
 Pt arrives POV w/ c/o SHOB. Reports when she woke up and checked her O2 level it was 60%. She has hx of COPD and lung cancer. O2 88% RA in triage. Doesn't wear O2 at home

## 2023-09-18 ENCOUNTER — Other Ambulatory Visit

## 2023-09-18 DIAGNOSIS — J9601 Acute respiratory failure with hypoxia: Secondary | ICD-10-CM | POA: Diagnosis not present

## 2023-09-18 LAB — GLUCOSE, CAPILLARY
Glucose-Capillary: 329 mg/dL — ABNORMAL HIGH (ref 70–99)
Glucose-Capillary: 295 mg/dL — ABNORMAL HIGH (ref 70–99)
Glucose-Capillary: 376 mg/dL — ABNORMAL HIGH (ref 70–99)
Glucose-Capillary: 446 mg/dL — ABNORMAL HIGH (ref 70–99)
Glucose-Capillary: 460 mg/dL — ABNORMAL HIGH (ref 70–99)

## 2023-09-18 LAB — BASIC METABOLIC PANEL WITH GFR
Anion gap: 9 (ref 5–15)
BUN: 13 mg/dL (ref 8–23)
CO2: 22 mmol/L (ref 22–32)
Calcium: 9.5 mg/dL (ref 8.9–10.3)
Chloride: 99 mmol/L (ref 98–111)
Creatinine, Ser: 0.87 mg/dL (ref 0.44–1.00)
GFR, Estimated: >60 mL/min (ref >60)
Glucose, Bld: 308 mg/dL — ABNORMAL HIGH (ref 70–99)
Potassium: 4.4 mmol/L (ref 3.5–5.1)
Sodium: 130 mmol/L — ABNORMAL LOW (ref 135–145)

## 2023-09-18 LAB — CBC
HCT: 41.1 % (ref 36.0–46.0)
Hemoglobin: 12.7 g/dL (ref 12.0–15.0)
MCH: 26.5 pg (ref 26.0–34.0)
MCHC: 30.9 g/dL (ref 30.0–36.0)
MCV: 85.6 fL (ref 80.0–100.0)
Platelets: 242 K/uL (ref 150–400)
RBC: 4.8 MIL/uL (ref 3.87–5.11)
RDW: 13.5 % (ref 11.5–15.5)
WBC: 10.2 K/uL (ref 4.0–10.5)
nRBC: 0 % (ref 0.0–0.2)

## 2023-09-18 LAB — HEMOGLOBIN A1C
Hgb A1c MFr Bld: 7.8 % — ABNORMAL HIGH (ref 4.8–5.6)
Mean Plasma Glucose: 177 mg/dL

## 2023-09-18 MED ORDER — INSULIN ASPART 100 UNIT/ML IJ SOLN
7.0000 [IU] | Freq: Once | INTRAMUSCULAR | Status: AC
  Administered 2023-09-18 (×2): 7 [IU] via SUBCUTANEOUS

## 2023-09-18 MED ORDER — METHYLPREDNISOLONE SODIUM SUCC 40 MG IJ SOLR
40.0000 mg | Freq: Two times a day (BID) | INTRAMUSCULAR | Status: DC
  Administered 2023-09-18 – 2023-09-19 (×3): 40 mg via INTRAVENOUS
  Filled 2023-09-18 (×2): qty 1

## 2023-09-18 MED ORDER — INSULIN GLARGINE-YFGN 100 UNIT/ML ~~LOC~~ SOLN
35.0000 [IU] | Freq: Every day | SUBCUTANEOUS | Status: DC
  Administered 2023-09-18 – 2023-09-21 (×5): 35 [IU] via SUBCUTANEOUS
  Filled 2023-09-18 (×4): qty 0.35

## 2023-09-18 MED ORDER — SODIUM CHLORIDE 3 % IN NEBU
4.0000 mL | INHALATION_SOLUTION | Freq: Every day | RESPIRATORY_TRACT | Status: AC
  Administered 2023-09-19 – 2023-09-20 (×2): 4 mL via RESPIRATORY_TRACT
  Filled 2023-09-18 (×3): qty 4

## 2023-09-18 NOTE — Plan of Care (Signed)
  Problem: Education: Goal: Knowledge of General Education information will improve Description: Including pain rating scale, medication(s)/side effects and non-pharmacologic comfort measures 09/18/2023 0457 by Melvyn Melvyn NOVAK, RN Outcome: Progressing 09/18/2023 0457 by Melvyn Melvyn NOVAK, RN Outcome: Progressing   Problem: Health Behavior/Discharge Planning: Goal: Ability to manage health-related needs will improve 09/18/2023 0457 by Melvyn Melvyn NOVAK, RN Outcome: Progressing 09/18/2023 0457 by Melvyn Melvyn NOVAK, RN Outcome: Progressing   Problem: Clinical Measurements: Goal: Ability to maintain clinical measurements within normal limits will improve 09/18/2023 0457 by Melvyn Melvyn NOVAK, RN Outcome: Progressing 09/18/2023 0457 by Melvyn Melvyn NOVAK, RN Outcome: Progressing

## 2023-09-18 NOTE — Progress Notes (Signed)
 PROGRESS NOTE    [name]  FMW:[id number] DOB: Jun 13, 1955 DOA: 09/17/2023 PCP: Verena Mems, MD  Brief Narrative: 68 year old female admitted with gradually worsening shortness of breath and productive cough and wheezing. Past medical history includessleep apnea, interstitial lung disease, malignant neoplasm of the right upper lung, CKD 3a, diabetes type 2 (insulin -dependent on 70/30), heart failure preserved EF 60 to 65%, prolonged QT, hepatic steatosis, CAD with carotid artery stenosis.  She was saturating at 60s on room air in the ER.  She was given steroids continuous nebs and azithromycin  and admitted to [facility] hospitalist.  She was admitted with a diagnosis of COPD exacerbation in the setting of right upper lobe lung malignancy.  Assessment & Plan:   Principal Problem:   Acute respiratory failure with hypoxia (HCC)  #1 acute hypoxic respiratory failure secondary to COPD exacerbation in the setting of malignant neoplasm of the right upper lobe patient was constantly coughing and wheezing unable to bring up her phlegm when I saw her today. Continue doxycycline  Start IV steroids DC prednisone  Continue nebulizer Will try hypertonic saline nebulizer x 3 doses to see if she is able to bring up the phlegm Did not have oxygen prior to admission currently on 2 L just keep her saturation above 92%. Chest x-ray shows -Stable abnormal right upper lobe and adjacent right paratracheal mediastinal contour since last month status post radiation for lung cancer.  No new cardiopulmonary abnormality identified. Check ambulatory oxygen saturation in a.m.  #2 diastolic heart failure appears euvolemic,on aldactone   #3 type 2 diabetes with hyperglycemia on 7030 at home appreciate diabetic coordinator input.  Will start Semglee  35 units starting tonight.  And NovoLog  3 units 3 times daily.  #4 stage IIIa CKD at baseline monitor closely  #5 history of CAD continue aspirin  and atenolol  Repatha  statin  and cilostazol .  #6 hypertension on Norvasc  and Tenormin  continue  # 7 hypothyroidism on synthroid     Nutrition Problem: Increased nutrient needs Etiology: acute illness  Signs/Symptoms: estimated needs  Interventions: Refer to RD note for recommendations  Estimated body mass index is 37.94 kg/m as calculated from the following:   Height as of this encounter: 5' 5 (1.651 m).   Weight as of this encounter: 103.4 kg.  DVT prophylaxis: Lovenox  Code Status: Full code Family Communication: Discussed with the patient  disposition Plan:  Status is: Inpatient Remains inpatient appropriate because: Acute illness   Consultants:  None  Procedures: None Antimicrobials: Doxy Subjective: Constantly coughing and diffusely wheezing unable to bring up her phlegm  Objective: Vitals:   09/18/23 0351 09/18/23 0927 09/18/23 1033 09/18/23 1308  BP: 129/72  124/61 112/62  Pulse: 80  80 79  Resp: 20   16  Temp: 98.7 F (37.1 C)   (!) 97.5 F (36.4 C)  TempSrc: Oral     SpO2: 97% 95%    Weight:      Height:        Intake/Output Summary (Last 24 hours) at 09/18/2023 1553 Last data filed at 09/18/2023 1300 Gross per 24 hour  Intake 240 ml  Output --  Net 240 ml   Filed Weights   09/17/23 0852  Weight: 103.4 kg    Examination:  General exam: Appears in distress  respiratory system: bilateral diffuse wheezing. Respiratory effort normal. Cardiovascular system: S1 & S2 heard, RRR. No JVD, murmurs, rubs, gallops or clicks. No pedal edema. Gastrointestinal system: Abdomen is nondistended, soft and nontender. No organomegaly or masses felt. Normal bowel sounds heard. Central  nervous system: Alert and oriented. No focal neurological deficits. Extremities: No edema  Data Reviewed: I have personally reviewed following labs and imaging studies  CBC: Recent Labs  Lab 09/17/23 0323 09/18/23 0434  WBC 5.5 10.2  HGB 14.3 12.7  HCT 45.1 41.1  MCV 84.9 85.6  PLT 273 242   Basic  Metabolic Panel: Recent Labs  Lab 09/17/23 0323 09/18/23 0434  NA 134* 130*  K 3.6 4.4  CL 99 99  CO2 25 22  GLUCOSE 185* 308*  BUN 10 13  CREATININE 1.07* 0.87  CALCIUM  9.5 9.5   GFR: Estimated Creatinine Clearance: 72.8 mL/min (by C-G formula based on SCr of 0.87 mg/dL). Liver Function Tests: No results for input(s): AST, ALT, ALKPHOS, BILITOT, PROT, ALBUMIN  in the last 168 hours. No results for input(s): LIPASE, AMYLASE in the last 168 hours. No results for input(s): AMMONIA in the last 168 hours. Coagulation Profile: No results for input(s): INR, PROTIME in the last 168 hours. Cardiac Enzymes: No results for input(s): CKTOTAL, CKMB, CKMBINDEX, TROPONINI in the last 168 hours. BNP (last 3 results) No results for input(s): PROBNP in the last 8760 hours. HbA1C: Recent Labs    09/17/23 1106  HGBA1C 7.8*   CBG: Recent Labs  Lab 09/17/23 1204 09/17/23 1623 09/17/23 2034 09/18/23 0757 09/18/23 1154  GLUCAP 281* 319* 319* 329* 295*   Lipid Profile: No results for input(s): CHOL, HDL, LDLCALC, TRIG, CHOLHDL, LDLDIRECT in the last 72 hours. Thyroid  Function Tests: No results for input(s): TSH, T4TOTAL, FREET4, T3FREE, THYROIDAB in the last 72 hours. Anemia Panel: No results for input(s): VITAMINB12, FOLATE, FERRITIN, TIBC, IRON, RETICCTPCT in the last 72 hours. Sepsis Labs: No results for input(s): PROCALCITON, LATICACIDVEN in the last 168 hours.  Recent Results (from the past 240 hours)  Resp panel by RT-PCR (RSV, Flu A&B, Covid) Anterior Nasal Swab     Status: None   Collection Time: 09/17/23  3:22 AM   Specimen: Anterior Nasal Swab  Result Value Ref Range Status   SARS Coronavirus 2 by RT PCR NEGATIVE NEGATIVE Final    Comment: (NOTE) SARS-CoV-2 target nucleic acids are NOT DETECTED.  The SARS-CoV-2 RNA is generally detectable in upper respiratory specimens during the acute phase of  infection. The lowest concentration of SARS-CoV-2 viral copies this assay can detect is 138 copies/mL. A negative result does not preclude SARS-Cov-2 infection and should not be used as the sole basis for treatment or other patient management decisions. A negative result may occur with  improper specimen collection/handling, submission of specimen other than nasopharyngeal swab, presence of viral mutation(s) within the areas targeted by this assay, and inadequate number of viral copies(<138 copies/mL). A negative result must be combined with clinical observations, patient history, and epidemiological information. The expected result is Negative.  Fact Sheet for Patients:  BloggerCourse.com  Fact Sheet for Healthcare Providers:  SeriousBroker.it  This test is no t yet approved or cleared by the [place]  FDA and  has been authorized for detection and/or diagnosis of SARS-CoV-2 by FDA under an Emergency Use Authorization (EUA). This EUA will remain  in effect (meaning this test can be used) for the duration of the COVID-19 declaration under Section 564(b)(1) of the Act, 21 U.S.C.section 360bbb-3(b)(1), unless the authorization is terminated  or revoked sooner.       Influenza A by PCR NEGATIVE NEGATIVE Final   Influenza B by PCR NEGATIVE NEGATIVE Final    Comment: (NOTE) The Xpert Xpress SARS-CoV-2/FLU/RSV plus assay is intended  as an aid in the diagnosis of influenza from Nasopharyngeal swab specimens and should not be used as a sole basis for treatment. Nasal washings and aspirates are unacceptable for Xpert Xpress SARS-CoV-2/FLU/RSV testing.  Fact Sheet for Patients: BloggerCourse.com  Fact Sheet for Healthcare Providers: SeriousBroker.it  This test is not yet approved or cleared by the [place]  FDA and has been authorized for detection and/or diagnosis of SARS-CoV-2  by FDA under an Emergency Use Authorization (EUA). This EUA will remain in effect (meaning this test can be used) for the duration of the COVID-19 declaration under Section 564(b)(1) of the Act, 21 U.S.C. section 360bbb-3(b)(1), unless the authorization is terminated or revoked.     Resp Syncytial Virus by PCR NEGATIVE NEGATIVE Final    Comment: (NOTE) Fact Sheet for Patients: BloggerCourse.com  Fact Sheet for Healthcare Providers: SeriousBroker.it  This test is not yet approved or cleared by the [place]  FDA and has been authorized for detection and/or diagnosis of SARS-CoV-2 by FDA under an Emergency Use Authorization (EUA). This EUA will remain in effect (meaning this test can be used) for the duration of the COVID-19 declaration under Section 564(b)(1) of the Act, 21 U.S.C. section 360bbb-3(b)(1), unless the authorization is terminated or revoked.  Performed at [facility], [address], [place], [place] [address]          Radiology Studies: DG Chest 2 View Result Date: 09/17/2023 CLINICAL DATA:  68 year old female with shortness of breath. Lung cancer, now status post radiation treatment. EXAM: CHEST - 2 VIEW COMPARISON:  CTA chest 06/16/2023. Chest radiographs 09/05/2023 and earlier. FINDINGS: Upright AP and lateral views ear 0335 hours. Right upper lung and right paratracheal mediastinal architectural distortion redemonstrated and stable since last month. Lung volumes and mediastinal contours are stable. Stable trachea. No pneumothorax, pleural effusion, left lung consolidation, or new pulmonary opacity compared to last month. Calcified aortic atherosclerosis. Negative visible bowel gas. Stable visualized osseous structures. IMPRESSION: Stable abnormal right upper lobe and adjacent right paratracheal mediastinal contour since last month status post radiation for lung cancer. No new cardiopulmonary  abnormality identified. Electronically Signed   By: VEAR Hurst M.D.   On: 09/17/2023 05:39   Scheduled Meds:  amLODipine   10 mg Oral Daily   atenolol   100 mg Oral Daily   budesonide  (PULMICORT ) nebulizer solution  2 mg Nebulization Q12H   cilostazol   100 mg Oral BID   dextromethorphan -guaiFENesin   1 tablet Oral BID   diclofenac  Sodium  1 Application Topical BID   dicyclomine   10 mg Oral TID AC   doxycycline   100 mg Oral Q12H   enoxaparin  (LOVENOX ) injection  40 mg Subcutaneous Q24H   insulin  aspart  0-5 Units Subcutaneous QHS   insulin  aspart  0-6 Units Subcutaneous TID WC   ipratropium-albuterol   3 mL Nebulization Q6H   levothyroxine   100 mcg Oral Q0600   predniSONE   20 mg Oral BID WC   Ensure Max Protein  11 oz Oral Daily   rosuvastatin   40 mg Oral Daily   sertraline   50 mg Oral Daily   sodium chloride  HYPERTONIC  4 mL Nebulization Daily   spironolactone   25 mg Oral Daily   umeclidinium bromide   1 puff Inhalation Daily   Continuous Infusions:   LOS: 1 day     Almarie KANDICE Hoots, MD 09/18/2023, 3:53 PM

## 2023-09-18 NOTE — Inpatient Diabetes Management (Signed)
 Inpatient Diabetes Program Recommendations  AACE/ADA: New Consensus Statement on Inpatient Glycemic Control (2015)  Target Ranges:  Prepandial:   less than 140 mg/dL      Peak postprandial:   less than 180 mg/dL (1-2 hours)      Critically ill patients:  140 - 180 mg/dL   Lab Results  Component Value Date   GLUCAP 329 (H) 09/18/2023   HGBA1C 7.8 (H) 09/17/2023    Review of Glycemic Control  Latest Reference Range & Units 09/17/23 12:04 09/17/23 16:23 09/17/23 20:34 09/18/23 07:57  Glucose-Capillary 70 - 99 mg/dL 718 (H) 680 (H) 680 (H) 329 (H)  (H): Data is abnormally high Diabetes history: Type 2 DM Outpatient Diabetes medications: Mounjaro 2 mg qwk, Novolog  70/30 60 units QA/40 units QP Current orders for Inpatient glycemic control: Novolog  0-6 units TID & HS Prednisone  20 units BID  Inpatient Diabetes Program Recommendations:    Consider adding Semglee  35 units every day and Novolog  5 units TID (assuming patient consuming >50% of meals)  Thanks, Tinnie Minus, MSN, RNC-OB Diabetes Coordinator [phone] (8a-5p)

## 2023-09-18 NOTE — Evaluation (Signed)
 Occupational Therapy Evaluation Patient Details Name: [name] MRN: [id number] DOB: April 29, 1955 Today's Date: 09/18/2023   History of Present Illness   68 y.o. female admitted 09/17/23 with worsening respiratory status, wheezing, hypoxia. PMH: allergies , history recurrent UTI multidrug-resistant Klebsiella, CAD, arthritis, DM, HTN, PAD, SBO, CVA, ureteral stone, heart failure, lung cancer     Clinical Impressions Pt seen for OT evaluation. Admitted with above diagnosis and presents with decreased functional activity tolerance, fatiguing quickly with mobility efforts. Prior to admission, pt lived alone with family available to assist, ambulates with SPC and is independent - mod independent for ADL performance. Pt requires supervision for safety during bed mobility, functional transfers and mobility. Pt able to complete 2 short distances of mobility around bed in room with good awareness of O2 line, and requires seated rest breaks due to dyspnea, coughing and fatigue. SpO2 on 2.5L 95% or above, trial on 1L with SpO2 94% or above. Left on 2.5L via [place] for comfort. Pt would benefit from skilled OT services to address noted impairments and functional limitations (see below for any additional details) in order to maximize safety and independence while minimizing falls risk and caregiver burden. Do not anticipate the need for follow up OT services upon acute hospital DC.      If plan is discharge home, recommend the following:   A little help with walking and/or transfers;A little help with bathing/dressing/bathroom;Assist for transportation     Functional Status Assessment   Patient has had a recent decline in their functional status and demonstrates the ability to make significant improvements in function in a reasonable and predictable amount of time.     Equipment Recommendations   None recommended by OT      Precautions/Restrictions   Precautions Precautions:  Fall Restrictions Weight Bearing Restrictions Per Provider Order: No     Mobility Bed Mobility Overal bed mobility: Needs Assistance Bed Mobility: Sit to Supine, Supine to Sit     Supine to sit: Supervision Sit to supine: Supervision        Transfers Overall transfer level: Needs assistance Equipment used: None Transfers: Sit to/from Stand Sit to Stand: Supervision           General transfer comment: supervision, multiple STS transfers observed during session. no LOB      Balance Overall balance assessment: Needs assistance Sitting-balance support: Feet supported, No upper extremity supported Sitting balance-Leahy Scale: Good     Standing balance support: During functional activity, No upper extremity supported Standing balance-Leahy Scale: Fair                             ADL either performed or assessed with clinical judgement   ADL Overall ADL's : Needs assistance/impaired Eating/Feeding: Independent;Sitting   Grooming: Standing;Supervision/safety   Upper Body Bathing: Sitting;Supervision/ safety   Lower Body Bathing: Supervison/ safety;Sit to/from stand   Upper Body Dressing : Supervision/safety;Sitting   Lower Body Dressing: Sitting/lateral leans;Supervision/safety Lower Body Dressing Details (indicate cue type and reason): doff/don socks seated, no LOB reaching outside BOS Toilet Transfer: Supervision/safety;Ambulation;Regular Toilet;Grab bars   Toileting- Clothing Manipulation and Hygiene: Supervision/safety;Sit to/from stand       Functional mobility during ADLs: Supervision/safety General ADL Comments: Pt walks 2x bouts of mobility around bed in room. No physical assist, supervision for safety, reaching out for unilateral hand support on furniture for increased stability. Pt reports incerased work of breathing with dyspnea on exertion  Vision Baseline Vision/History: 1 Wears glasses Ability to See in Adequate Light: 0  Adequate       Perception         Praxis         Pertinent Vitals/Pain Pain Assessment Pain Assessment: No/denies pain     Extremity/Trunk Assessment Upper Extremity Assessment Upper Extremity Assessment: Right hand dominant;Overall [facility] for tasks assessed   Lower Extremity Assessment Lower Extremity Assessment: Overall WFL for tasks assessed;Defer to PT evaluation       Communication Communication Communication: No apparent difficulties   Cognition Arousal: Alert Behavior During Therapy: WFL for tasks assessed/performed Cognition: No apparent impairments             OT - Cognition Comments: good awareness of O2 line in room                 Following commands: Intact       Cueing  General Comments   Cueing Techniques: Verbal cues  Pt recieved on 2.5L via [place]. SpO2 96% on 2.5L, decreased down to 1L for short distance mobility ~10 ft, SpO2 94%>. Returned to 2.5L for comfort due to increased dyspnea end of session. Pt with coughing spells after mobility           Home Living Family/patient expects to be discharged to:: Private residence Living Arrangements: Children Available Help at Discharge: Family;Available PRN/intermittently Type of Home: Apartment Home Access: Level entry     Home Layout: One level     Bathroom Shower/Tub: Producer, television/film/video: Standard Bathroom Accessibility: Yes   Home Equipment: Cane - single point;Rollator (4 wheels)   Additional Comments: handicapped apt      Prior Functioning/Environment Prior Level of Function : Independent/Modified Independent             Mobility Comments: SPC for mobility or furniture walks. Son assists with transportation t/f MD appts ADLs Comments: independent for ADL performance, son assists with meal prep and grocery shopping    OT Problem List: Decreased activity tolerance;Decreased knowledge of use of DME or AE;Decreased knowledge of precautions;Cardiopulmonary  status limiting activity   OT Treatment/Interventions: Self-care/ADL training;Therapeutic exercise;Energy conservation;DME and/or AE instruction;Therapeutic activities;Patient/family education;Balance training      OT Goals(Current goals can be found in the care plan section)   Acute Rehab OT Goals OT Goal Formulation: With patient Time For Goal Achievement: 10/02/23 Potential to Achieve Goals: Good   OT Frequency:  Min 2X/week       AM-PAC OT 6 Clicks Daily Activity     Outcome Measure Help from another person eating meals?: None Help from another person taking care of personal grooming?: None Help from another person toileting, which includes using toliet, bedpan, or urinal?: A Little Help from another person bathing (including washing, rinsing, drying)?: None Help from another person to put on and taking off regular upper body clothing?: None Help from another person to put on and taking off regular lower body clothing?: A Little 6 Click Score: 22   End of Session Equipment Utilized During Treatment: Oxygen Nurse Communication: Mobility status  Activity Tolerance: Patient tolerated treatment well Patient left: in bed;with call bell/phone within reach  OT Visit Diagnosis: Other abnormalities of gait and mobility (R26.89);Unsteadiness on feet (R26.81)                Time: 9155-9140 OT Time Calculation (min): 15 min Charges:  OT General Charges $OT Visit: 1 Visit OT Evaluation $OT Eval Low Complexity: 1 Low  Deette Revak L. Minahil Quinlivan, OTR/L  09/18/23, 10:46 AM

## 2023-09-18 NOTE — Plan of Care (Signed)

## 2023-09-19 DIAGNOSIS — J9601 Acute respiratory failure with hypoxia: Secondary | ICD-10-CM | POA: Diagnosis not present

## 2023-09-19 LAB — GLUCOSE, CAPILLARY
Glucose-Capillary: 383 mg/dL — ABNORMAL HIGH (ref 70–99)
Glucose-Capillary: 362 mg/dL — ABNORMAL HIGH (ref 70–99)
Glucose-Capillary: 380 mg/dL — ABNORMAL HIGH (ref 70–99)
Glucose-Capillary: 318 mg/dL — ABNORMAL HIGH (ref 70–99)
Glucose-Capillary: 395 mg/dL — ABNORMAL HIGH (ref 70–99)

## 2023-09-19 LAB — CBC
HCT: 41.5 % (ref 36.0–46.0)
Hemoglobin: 12.9 g/dL (ref 12.0–15.0)
MCH: 26.3 pg (ref 26.0–34.0)
MCHC: 31.1 g/dL (ref 30.0–36.0)
MCV: 84.5 fL (ref 80.0–100.0)
Platelets: 291 K/uL (ref 150–400)
RBC: 4.91 MIL/uL (ref 3.87–5.11)
RDW: 13.5 % (ref 11.5–15.5)
WBC: 11.4 K/uL — ABNORMAL HIGH (ref 4.0–10.5)
nRBC: 0 % (ref 0.0–0.2)

## 2023-09-19 LAB — COMPREHENSIVE METABOLIC PANEL WITH GFR
ALT: 13 U/L (ref 0–44)
AST: 15 U/L (ref 15–41)
Albumin: 3.4 g/dL — ABNORMAL LOW (ref 3.5–5.0)
Alkaline Phosphatase: 80 U/L (ref 38–126)
Anion gap: 10 (ref 5–15)
BUN: 22 mg/dL (ref 8–23)
CO2: 23 mmol/L (ref 22–32)
Calcium: 9.3 mg/dL (ref 8.9–10.3)
Chloride: 95 mmol/L — ABNORMAL LOW (ref 98–111)
Creatinine, Ser: 0.99 mg/dL (ref 0.44–1.00)
GFR, Estimated: >60 mL/min (ref >60)
Glucose, Bld: 383 mg/dL — ABNORMAL HIGH (ref 70–99)
Potassium: 4.9 mmol/L (ref 3.5–5.1)
Sodium: 128 mmol/L — ABNORMAL LOW (ref 135–145)
Total Bilirubin: 0.6 mg/dL (ref 0.0–1.2)
Total Protein: 8.1 g/dL (ref 6.5–8.1)

## 2023-09-19 MED ORDER — BENZONATATE 100 MG PO CAPS
200.0000 mg | ORAL_CAPSULE | Freq: Three times a day (TID) | ORAL | Status: DC
  Administered 2023-09-19 – 2023-09-21 (×6): 200 mg via ORAL
  Filled 2023-09-19 (×6): qty 2

## 2023-09-19 MED ORDER — OXYCODONE HCL 5 MG PO TABS
5.0000 mg | ORAL_TABLET | Freq: Four times a day (QID) | ORAL | Status: DC | PRN
  Administered 2023-09-19 – 2023-09-21 (×4): 5 mg via ORAL
  Filled 2023-09-19 (×4): qty 1

## 2023-09-19 MED ORDER — PREDNISONE 20 MG PO TABS
40.0000 mg | ORAL_TABLET | Freq: Every day | ORAL | Status: DC
  Administered 2023-09-20 – 2023-09-21 (×2): 40 mg via ORAL
  Filled 2023-09-19 (×3): qty 2

## 2023-09-19 MED ORDER — BUDESONIDE 0.5 MG/2ML IN SUSP
0.5000 mg | Freq: Two times a day (BID) | RESPIRATORY_TRACT | Status: DC
  Administered 2023-09-19 – 2023-09-21 (×4): 0.5 mg via RESPIRATORY_TRACT
  Filled 2023-09-19 (×4): qty 2

## 2023-09-19 MED ORDER — INSULIN ASPART 100 UNIT/ML IJ SOLN
5.0000 [IU] | Freq: Three times a day (TID) | INTRAMUSCULAR | Status: DC
  Administered 2023-09-19 – 2023-09-21 (×7): 5 [IU] via SUBCUTANEOUS

## 2023-09-19 NOTE — Inpatient Diabetes Management (Signed)
 Inpatient Diabetes Program Recommendations  AACE/ADA: New Consensus Statement on Inpatient Glycemic Control (2015)  Target Ranges:  Prepandial:   less than 140 mg/dL      Peak postprandial:   less than 180 mg/dL (1-2 hours)      Critically ill patients:  140 - 180 mg/dL   Lab Results  Component Value Date   GLUCAP 362 (H) 09/19/2023   HGBA1C 7.8 (H) 09/17/2023    Review of Glycemic Control  Diabetes history: DM2 Outpatient Diabetes medications: Mounjaro 2 mg weekly, Novolog  70/30 60 QAM and 40 QPM Current orders for Inpatient glycemic control: Semglee  35 units daily, Novolog  0-6 TID with meals and 0-5 HS + 5 units TID  Inpatient Diabetes Program Recommendations:    Consider increasing Semglee  to 40 units in am and 20 units at bedtime  Consider increasing Novolog  to 10 units TID with meals if eating > 50%  Continue to follow.  Thank you. Shona Brandy, RD, LDN, CDCES Inpatient Diabetes Coordinator [phone]

## 2023-09-19 NOTE — Plan of Care (Signed)

## 2023-09-19 NOTE — Plan of Care (Signed)

## 2023-09-19 NOTE — Progress Notes (Signed)
 Physical Therapy Treatment Patient Details Name: [name] MRN: [id number] DOB: [birth year] Today's Date: 09/19/2023   History of Present Illness 68 y.o. female admitted 09/17/23 with worsening respiratory status, wheezing, hypoxia. PMH: allergies , history recurrent UTI multidrug-resistant Klebsiella, CAD, arthritis, DM, HTN, PAD, SBO, CVA, ureteral stone, heart failure, lung cancer    PT Comments  Pt in bed, agreeable to session. Presented with 4WW to assist in efficiency and stability with amb and OOB activity today. Pt able to amb x120ft with 4WW on 2L O2 via [place]. O2 remains 93% + throughout the session, HR ranges 83-87bpm. Pt becomes increasingly SOB from 100-156ft, able to return to sitting EOB with active coughing which subsides with time. Pt encouraged to use 4WW at home for safety and activity tolerance with the ability to take rest breaks prn. Pt in bed with nursing present at the conclusion of the session.     If plan is discharge home, recommend the following: Help with stairs or ramp for entrance;Assist for transportation;Assistance with cooking/housework   Can travel by private vehicle        Equipment Recommendations  None recommended by PT    Recommendations for Other Services       Precautions / Restrictions Precautions Precautions: Fall Recall of Precautions/Restrictions: Intact Precaution/Restrictions Comments: monitor sats Restrictions Weight Bearing Restrictions Per Provider Order: No     Mobility  Bed Mobility Overal bed mobility: Needs Assistance Bed Mobility: Sit to Supine, Supine to Sit     Supine to sit: Modified independent (Device/Increase time) Sit to supine: Modified independent (Device/Increase time)        Transfers Overall transfer level: Needs assistance Equipment used: None Transfers: Sit to/from Stand Sit to Stand: Supervision                Ambulation/Gait Ambulation/Gait assistance: Supervision Gait Distance (Feet): 120  Feet Assistive device: Rollator (4 wheels) Gait Pattern/deviations: Step-through pattern, Trunk flexed, Decreased stride length Gait velocity: decr     General Gait Details: Pt able to demonstrate improved distance and tolerance to activity with use of 4WW today, completes directional changes and is familiar with device stating she has one at home   Stairs             Wheelchair Mobility     Tilt Bed    Modified Rankin (Stroke Patients Only)       Balance Overall balance assessment: Needs assistance Sitting-balance support: Feet supported, No upper extremity supported Sitting balance-Leahy Scale: Good     Standing balance support: During functional activity, No upper extremity supported Standing balance-Leahy Scale: Fair                              Hotel manager: No apparent difficulties  Cognition Arousal: Alert Behavior During Therapy: WFL for tasks assessed/performed   PT - Cognitive impairments: No apparent impairments                         Following commands: Intact      Cueing Cueing Techniques: Verbal cues  Exercises      General Comments        Pertinent Vitals/Pain Pain Assessment Pain Assessment: Faces Faces Pain Scale: Hurts even more Pain Location: lower abd from coughing Pain Descriptors / Indicators: Grimacing, Aching, Constant, Discomfort Pain Intervention(s): Limited activity within patient's tolerance, Monitored during session, Repositioned    Home Living  Prior Function            PT Goals (current goals can now be found in the care plan section) Acute Rehab PT Goals Patient Stated Goal: not cough, walk PT Goal Formulation: With patient Time For Goal Achievement: 10/01/23 Potential to Achieve Goals: Good Progress towards PT goals: Progressing toward goals    Frequency    Min 2X/week      PT Plan      Co-evaluation               AM-PAC PT 6 Clicks Mobility   Outcome Measure  Help needed turning from your back to your side while in a flat bed without using bedrails?: None Help needed moving from lying on your back to sitting on the side of a flat bed without using bedrails?: None Help needed moving to and from a bed to a chair (including a wheelchair)?: None Help needed standing up from a chair using your arms (e.g., wheelchair or bedside chair)?: None Help needed to walk in hospital room?: A Little Help needed climbing 3-5 steps with a railing? : A Little 6 Click Score: 22    End of Session Equipment Utilized During Treatment: Oxygen;Gait belt Activity Tolerance: Patient limited by fatigue Patient left: in bed;with call bell/phone within reach;with nursing/sitter in room Nurse Communication: Mobility status PT Visit Diagnosis: Unsteadiness on feet (R26.81);Difficulty in walking, not elsewhere classified (R26.2)     Time: 8553-8494 PT Time Calculation (min) (ACUTE ONLY): 19 min  Charges:    $Gait Training: 8-22 mins PT General Charges $$ ACUTE PT VISIT: 1 Visit                     Amanda, PT Acute Rehabilitation Services Office: [phone] 09/19/2023    [name] 09/19/2023, 3:22 PM

## 2023-09-19 NOTE — Progress Notes (Signed)
 Occupational Therapy Treatment Patient Details Name: [name] MRN: [id number] DOB: 1956/01/01 Today's Date: 09/19/2023   History of present illness 68 y.o. female admitted 09/17/23 with worsening respiratory status, wheezing, hypoxia. PMH: allergies , history recurrent UTI multidrug-resistant Klebsiella, CAD, arthritis, DM, HTN, PAD, SBO, CVA, ureteral stone, heart failure, lung cancer   OT comments  Pt agreeable to performing functional mobility with supervision, no AD. Pt politely declines use of RW or 4WW for energy conservation, OT provides occassional HHA and pt self-directs standing rest breaks 3-4x during 300 ft mobility in hallway, using guard rail for support. OT educates on energy conservation strategies with handout provided, pt with good carryover of pursed lip breathing during session. Pt recieved on 2L via [place], SpO2 95%>. Trial of RA during mobility, pt desaturates to 89%-92% on RA. Pt expresses feeling SOB on RA and has decreased tolerance to activity. Pt left with RT in room for breathing tx. Discharge recommendation appropriate, OT will continue to follow.       If plan is discharge home, recommend the following:  A little help with walking and/or transfers;A little help with bathing/dressing/bathroom;Assist for transportation   Equipment Recommendations  None recommended by OT       Precautions / Restrictions Precautions Precautions: Fall Recall of Precautions/Restrictions: Intact Precaution/Restrictions Comments: monitor sats Restrictions Weight Bearing Restrictions Per Provider Order: No       Mobility Bed Mobility Overal bed mobility: Needs Assistance Bed Mobility: Supine to Sit, Sit to Supine     Supine to sit: Modified independent (Device/Increase time) Sit to supine: Modified independent (Device/Increase time)        Transfers Overall transfer level: Needs assistance Equipment used: None Transfers: Sit to/from Stand Sit to Stand: Supervision            General transfer comment: supervision, multiple STS transfers observed during session. no LOB     Balance Overall balance assessment: Needs assistance Sitting-balance support: Feet supported, No upper extremity supported Sitting balance-Leahy Scale: Good     Standing balance support: During functional activity, No upper extremity supported Standing balance-Leahy Scale: Fair                             ADL either performed or assessed with clinical judgement   ADL Overall ADL's : Needs assistance/impaired                                     Functional mobility during ADLs: Supervision/safety General ADL Comments: Pt agreeable to performing functional mobility with supervision, no AD. Pt politely declines use of RW for energy conservation, OT provides occassional HHA and pt self-directs standing rest breaks 3-4x during 300 ft, using guard rail for support.     Communication Communication Communication: No apparent difficulties   Cognition Arousal: Alert Behavior During Therapy: WFL for tasks assessed/performed Cognition: No apparent impairments                               Following commands: Intact        Cueing   Cueing Techniques: Verbal cues  Exercises Exercises: Other exercises Other Exercises Other Exercises: disussed energy conservation strategies with handout provided       General Comments Pt recieved on 2L via [place], SpO2 95%>. Trial of RA during mobility, pt desaturates to  89%-92%. Pt left with RT in room for breathing tx.    Pertinent Vitals/ Pain       Pain Assessment Pain Assessment: Faces Faces Pain Scale: Hurts little more Pain Location: BLE and lower abdomen Pain Descriptors / Indicators: Grimacing, Aching, Constant, Discomfort Pain Intervention(s): Limited activity within patient's tolerance   Frequency  Min 2X/week        Progress Toward Goals  OT Goals(current goals can now be found in  the care plan section)  Progress towards OT goals: Progressing toward goals  Acute Rehab OT Goals OT Goal Formulation: With patient Time For Goal Achievement: 10/02/23 Potential to Achieve Goals: Good ADL Goals Pt Will Perform Grooming: Independently;standing Pt Will Transfer to Toilet: with modified independence;grab bars Pt Will Perform Toileting - Clothing Manipulation and hygiene: with modified independence;sit to/from stand Additional ADL Goal #1: Pt will verbalize and implement 3 or more energy conservation strategies for use in daily routines  Plan         AM-PAC OT 6 Clicks Daily Activity     Outcome Measure   Help from another person eating meals?: None Help from another person taking care of personal grooming?: None Help from another person toileting, which includes using toliet, bedpan, or urinal?: A Little Help from another person bathing (including washing, rinsing, drying)?: None Help from another person to put on and taking off regular upper body clothing?: None Help from another person to put on and taking off regular lower body clothing?: A Little 6 Click Score: 22    End of Session Equipment Utilized During Treatment: Gait belt;Oxygen  OT Visit Diagnosis: Other abnormalities of gait and mobility (R26.89);Unsteadiness on feet (R26.81)   Activity Tolerance Patient tolerated treatment well   Patient Left in chair;with call bell/phone within reach;with nursing/sitter in room   Nurse Communication Mobility status (RT in room, O2 sats)        Time: 9142-9081 OT Time Calculation (min): 21 min  Charges: OT General Charges $OT Visit: 1 Visit OT Treatments $Self Care/Home Management : 8-22 mins  [name], OTR/L  09/19/23, 3:33 PM

## 2023-09-19 NOTE — Progress Notes (Signed)
 PROGRESS NOTE    [name]  FMW:[id number] DOB: 17-Apr-1955 DOA: 09/17/2023 PCP: Verena Mems, MD   Brief Narrative:    68 year old female admitted with gradually worsening shortness of breath and productive cough and wheezing. Past medical history includessleep apnea, interstitial lung disease, malignant neoplasm of the right upper lung, CKD 3a, diabetes type 2 (insulin -dependent on 70/30), heart failure preserved EF 60 to 65%, prolonged QT, hepatic steatosis, CAD with carotid artery stenosis.  She was saturating at 60s on room air in the ER.  She was given steroids continuous nebs and azithromycin  and admitted to [facility] hospitalist.  She was admitted with a diagnosis of COPD exacerbation and AHRF in the setting of right upper lobe lung malignancy.  Assessment & Plan:  Principal Problem:   Acute respiratory failure with hypoxia (HCC) Active Problems:   Acute hypoxic respiratory failure (HCC)   COPD with acute exacerbation (HCC)   Cancer of upper lobe of right lung (HCC)   Insulin  dependent type 2 diabetes mellitus (HCC)   Hypertension associated with diabetes (HCC)   Acute hypoxic respiratory failure secondary to COPD exacerbation: in the setting of malignant neoplasm of the right upper lobe  Continue doxycycline  Continue prednisone  40 mg daily, Dced IV steroids Continue inhalational bronchodilator therapy Goal O2 saturation between 88-92% Chest x-ray shows -Stable abnormal right upper lobe and adjacent right paratracheal mediastinal contour since last month status post radiation for lung cancer. 6 minute walk test to be done before discharge Continue with mucinex    Chronic Diastolic heart failure: NYHA III, appears euvolemic,on aldactone    Type 2 diabetes with hyperglycemia: Appreciate diabetic coordinator's input.   Continue with Semglee  35 units and NovoLog  5 units 3 times daily. SSI Carb controlled diet   Stage IIIa CKD: at baseline monitor closely   History of CAD:  continue aspirin  and atenolol  Repatha  statin and cilostazol .   Hypertension: on Norvasc  and Tenormin    Hypothyroidism: on synthroid    Class II obesity,POA: As evidenced by BMI of almost 38.   DVT prophylaxis: enoxaparin  (LOVENOX ) injection 40 mg Start: 09/17/23 2200     Code Status: Full Code Family Communication:   Status is: Inpatient Remains inpatient appropriate because: AHRF,COPD exacerbation    Subjective:  She feels better than yesterday. She doesn't use oxygen at home. Lives with her son. She had received radiation for her lung cancer in the past and has no acute issues with it. Complaining of frequent coughing spells and abdominal pain associated with those spells.  Examination:  General exam: Appears calm and comfortable, nasal oxygen cannula in place  Respiratory system: Slight tachypnea, b/l expiratory wheezing Cardiovascular system: S1 & S2 heard, RRR. No JVD, murmurs, rubs, gallops or clicks. No pedal edema. Gastrointestinal system: Abdomen is nondistended, soft and nontender. No organomegaly or masses felt. Normal bowel sounds heard. Central nervous system: Alert and oriented. No focal neurological deficits. Extremities: Symmetric 5 x 5 power. Skin: No rashes, lesions or ulcers Psychiatry: Judgement and insight appear normal. Mood & affect appropriate.       Diet Orders (From admission, onward)     Start     Ordered   09/17/23 0759  Diet heart healthy/carb modified Room service appropriate? Yes; Fluid consistency: Thin  Diet effective now       Question Answer Comment  Diet-HS Snack? Nothing   Room service appropriate? Yes   Fluid consistency: Thin      09/17/23 0800            Objective:  Vitals:   09/18/23 2025 09/19/23 0442 09/19/23 0821 09/19/23 0915  BP: 133/64 (!) 118/58 132/69   Pulse: 81 81 75   Resp: 18 18 18    Temp: 98.6 F (37 C) 98.4 F (36.9 C) 98.1 F (36.7 C)   TempSrc:   Oral   SpO2: 98% 96% 95% 91%  Weight:       Height:        Intake/Output Summary (Last 24 hours) at 09/19/2023 0939 Last data filed at 09/19/2023 0800 Gross per 24 hour  Intake 600 ml  Output --  Net 600 ml   Filed Weights   09/17/23 0852  Weight: 103.4 kg    Scheduled Meds:  amLODipine   10 mg Oral Daily   atenolol   100 mg Oral Daily   budesonide  (PULMICORT ) nebulizer solution  0.5 mg Nebulization Q12H   cilostazol   100 mg Oral BID   dextromethorphan -guaiFENesin   1 tablet Oral BID   diclofenac  Sodium  1 Application Topical BID   dicyclomine   10 mg Oral TID AC   doxycycline   100 mg Oral Q12H   enoxaparin  (LOVENOX ) injection  40 mg Subcutaneous Q24H   insulin  aspart  0-5 Units Subcutaneous QHS   insulin  aspart  0-6 Units Subcutaneous TID WC   insulin  aspart  5 Units Subcutaneous TID WC   insulin  glargine-yfgn  35 Units Subcutaneous Daily   ipratropium-albuterol   3 mL Nebulization Q6H   levothyroxine   100 mcg Oral Q0600   methylPREDNISolone  (SOLU-MEDROL ) injection  40 mg Intravenous Q12H   Ensure Max Protein  11 oz Oral Daily   rosuvastatin   40 mg Oral Daily   sertraline   50 mg Oral Daily   sodium chloride  HYPERTONIC  4 mL Nebulization Daily   spironolactone   25 mg Oral Daily   umeclidinium bromide   1 puff Inhalation Daily   Continuous Infusions:  Nutritional status Signs/Symptoms: estimated needs Interventions: Refer to RD note for recommendations Body mass index is 37.94 kg/m.  Data Reviewed:   CBC: Recent Labs  Lab 09/17/23 0323 09/18/23 0434 09/19/23 0417  WBC 5.5 10.2 11.4*  HGB 14.3 12.7 12.9  HCT 45.1 41.1 41.5  MCV 84.9 85.6 84.5  PLT 273 242 291   Basic Metabolic Panel: Recent Labs  Lab 09/17/23 0323 09/18/23 0434 09/19/23 0417  NA 134* 130* 128*  K 3.6 4.4 4.9  CL 99 99 95*  CO2 25 22 23   GLUCOSE 185* 308* 383*  BUN 10 13 22   CREATININE 1.07* 0.87 0.99  CALCIUM  9.5 9.5 9.3   GFR: Estimated Creatinine Clearance: 64 mL/min (by C-G formula based on SCr of 0.99 mg/dL). Liver  Function Tests: Recent Labs  Lab 09/19/23 0417  AST 15  ALT 13  ALKPHOS 80  BILITOT 0.6  PROT 8.1  ALBUMIN  3.4*   No results for input(s): LIPASE, AMYLASE in the last 168 hours. No results for input(s): AMMONIA in the last 168 hours. Coagulation Profile: No results for input(s): INR, PROTIME in the last 168 hours. Cardiac Enzymes: No results for input(s): CKTOTAL, CKMB, CKMBINDEX, TROPONINI in the last 168 hours. BNP (last 3 results) No results for input(s): PROBNP in the last 8760 hours. HbA1C: Recent Labs    09/17/23 1106  HGBA1C 7.8*   CBG: Recent Labs  Lab 09/18/23 1654 09/18/23 2118 09/18/23 2119 09/19/23 0100 09/19/23 0734  GLUCAP 376* 446* 460* 383* 362*   Lipid Profile: No results for input(s): CHOL, HDL, LDLCALC, TRIG, CHOLHDL, LDLDIRECT in the last 72 hours. Thyroid  Function Tests:  No results for input(s): TSH, T4TOTAL, FREET4, T3FREE, THYROIDAB in the last 72 hours. Anemia Panel: No results for input(s): VITAMINB12, FOLATE, FERRITIN, TIBC, IRON, RETICCTPCT in the last 72 hours. Sepsis Labs: No results for input(s): PROCALCITON, LATICACIDVEN in the last 168 hours.  Recent Results (from the past 240 hours)  Resp panel by RT-PCR (RSV, Flu A&B, Covid) Anterior Nasal Swab     Status: None   Collection Time: 09/17/23  3:22 AM   Specimen: Anterior Nasal Swab  Result Value Ref Range Status   SARS Coronavirus 2 by RT PCR NEGATIVE NEGATIVE Final    Comment: (NOTE) SARS-CoV-2 target nucleic acids are NOT DETECTED.  The SARS-CoV-2 RNA is generally detectable in upper respiratory specimens during the acute phase of infection. The lowest concentration of SARS-CoV-2 viral copies this assay can detect is 138 copies/mL. A negative result does not preclude SARS-Cov-2 infection and should not be used as the sole basis for treatment or other patient management decisions. A negative result may occur with   improper specimen collection/handling, submission of specimen other than nasopharyngeal swab, presence of viral mutation(s) within the areas targeted by this assay, and inadequate number of viral copies(<138 copies/mL). A negative result must be combined with clinical observations, patient history, and epidemiological information. The expected result is Negative.  Fact Sheet for Patients:  BloggerCourse.com  Fact Sheet for Healthcare Providers:  SeriousBroker.it  This test is no t yet approved or cleared by the [place]  FDA and  has been authorized for detection and/or diagnosis of SARS-CoV-2 by FDA under an Emergency Use Authorization (EUA). This EUA will remain  in effect (meaning this test can be used) for the duration of the COVID-19 declaration under Section 564(b)(1) of the Act, 21 U.S.C.section 360bbb-3(b)(1), unless the authorization is terminated  or revoked sooner.       Influenza A by PCR NEGATIVE NEGATIVE Final   Influenza B by PCR NEGATIVE NEGATIVE Final    Comment: (NOTE) The Xpert Xpress SARS-CoV-2/FLU/RSV plus assay is intended as an aid in the diagnosis of influenza from Nasopharyngeal swab specimens and should not be used as a sole basis for treatment. Nasal washings and aspirates are unacceptable for Xpert Xpress SARS-CoV-2/FLU/RSV testing.  Fact Sheet for Patients: BloggerCourse.com  Fact Sheet for Healthcare Providers: SeriousBroker.it  This test is not yet approved or cleared by the [place]  FDA and has been authorized for detection and/or diagnosis of SARS-CoV-2 by FDA under an Emergency Use Authorization (EUA). This EUA will remain in effect (meaning this test can be used) for the duration of the COVID-19 declaration under Section 564(b)(1) of the Act, 21 U.S.C. section 360bbb-3(b)(1), unless the authorization is terminated or revoked.      Resp Syncytial Virus by PCR NEGATIVE NEGATIVE Final    Comment: (NOTE) Fact Sheet for Patients: BloggerCourse.com  Fact Sheet for Healthcare Providers: SeriousBroker.it  This test is not yet approved or cleared by the [place]  FDA and has been authorized for detection and/or diagnosis of SARS-CoV-2 by FDA under an Emergency Use Authorization (EUA). This EUA will remain in effect (meaning this test can be used) for the duration of the COVID-19 declaration under Section 564(b)(1) of the Act, 21 U.S.C. section 360bbb-3(b)(1), unless the authorization is terminated or revoked.  Performed at [facility], [address] [place], [place] [address]          Radiology Studies: No results found.      LOS: 2 days   Time spent= 43 mins  Deliliah Room, MD [facility]  If 7PM-7AM, please contact night-coverage  09/19/2023, 9:39 AM

## 2023-09-20 DIAGNOSIS — E1140 Type 2 diabetes mellitus with diabetic neuropathy, unspecified: Secondary | ICD-10-CM | POA: Diagnosis not present

## 2023-09-20 DIAGNOSIS — J9601 Acute respiratory failure with hypoxia: Secondary | ICD-10-CM | POA: Diagnosis not present

## 2023-09-20 DIAGNOSIS — E1121 Type 2 diabetes mellitus with diabetic nephropathy: Secondary | ICD-10-CM | POA: Diagnosis not present

## 2023-09-20 DIAGNOSIS — J439 Emphysema, unspecified: Secondary | ICD-10-CM | POA: Diagnosis not present

## 2023-09-20 DIAGNOSIS — I701 Atherosclerosis of renal artery: Secondary | ICD-10-CM | POA: Diagnosis not present

## 2023-09-20 LAB — GLUCOSE, CAPILLARY
Glucose-Capillary: 249 mg/dL — ABNORMAL HIGH (ref 70–99)
Glucose-Capillary: 221 mg/dL — ABNORMAL HIGH (ref 70–99)
Glucose-Capillary: 301 mg/dL — ABNORMAL HIGH (ref 70–99)
Glucose-Capillary: 350 mg/dL — ABNORMAL HIGH (ref 70–99)

## 2023-09-20 LAB — CBC WITH DIFFERENTIAL/PLATELET
Abs Immature Granulocytes: 0.05 K/uL (ref 0.00–0.07)
Basophils Absolute: 0 K/uL (ref 0.0–0.1)
Basophils Relative: 0 %
Eosinophils Absolute: 0 K/uL (ref 0.0–0.5)
Eosinophils Relative: 0 %
HCT: 40.2 % (ref 36.0–46.0)
Hemoglobin: 13 g/dL (ref 12.0–15.0)
Immature Granulocytes: 1 %
Lymphocytes Relative: 15 %
Lymphs Abs: 1.6 K/uL (ref 0.7–4.0)
MCH: 26.9 pg (ref 26.0–34.0)
MCHC: 32.3 g/dL (ref 30.0–36.0)
MCV: 83.2 fL (ref 80.0–100.0)
Monocytes Absolute: 1 K/uL (ref 0.1–1.0)
Monocytes Relative: 9 %
Neutro Abs: 8.4 K/uL — ABNORMAL HIGH (ref 1.7–7.7)
Neutrophils Relative %: 75 %
Platelets: 299 K/uL (ref 150–400)
RBC: 4.83 MIL/uL (ref 3.87–5.11)
RDW: 13.4 % (ref 11.5–15.5)
WBC: 11.1 K/uL — ABNORMAL HIGH (ref 4.0–10.5)
nRBC: 0 % (ref 0.0–0.2)

## 2023-09-20 LAB — BASIC METABOLIC PANEL WITH GFR
Anion gap: 9 (ref 5–15)
BUN: 23 mg/dL (ref 8–23)
CO2: 25 mmol/L (ref 22–32)
Calcium: 9.2 mg/dL (ref 8.9–10.3)
Chloride: 96 mmol/L — ABNORMAL LOW (ref 98–111)
Creatinine, Ser: 1.27 mg/dL — ABNORMAL HIGH (ref 0.44–1.00)
GFR, Estimated: 46 mL/min — ABNORMAL LOW (ref >60)
Glucose, Bld: 315 mg/dL — ABNORMAL HIGH (ref 70–99)
Potassium: 3.9 mmol/L (ref 3.5–5.1)
Sodium: 130 mmol/L — ABNORMAL LOW (ref 135–145)

## 2023-09-20 NOTE — Progress Notes (Signed)
 SATURATION QUALIFICATIONS: (This note is used to comply with regulatory documentation for home oxygen)  Patient Saturations on Room Air at Rest = 93%  Patient Saturations on Room Air while Ambulating = 87%  Patient Saturations on 2 Liters of oxygen while Ambulating = 91%  Please briefly explain why patient needs home oxygen: Dyspnea upon exertion   Distance ambulated: 61ft

## 2023-09-20 NOTE — Progress Notes (Signed)
 Mobility Specialist - Progress Note Nurse requested Mobility Specialist to perform oxygen saturation test with pt which includes removing pt from oxygen both at rest and while ambulating.  Below are the results from that testing.     Patient Saturations on Room Air at Rest = spO2 93% %  Patient Saturations on Room Air while Ambulating = sp02 87% .  Rested and performed pursed lip breathing for 1 minute with sp02 at 90%.  Patient Saturations on 2 Liters of oxygen while Ambulating = sp02 91%  At end of testing pt left in room on 2  Liters of oxygen.  Reported results to nurse.     09/20/23 0954  Mobility  Activity Ambulated with assistance  Level of Assistance Standby assist, set-up cues, supervision of patient - no hands on  Assistive Device Four wheel walker  Distance Ambulated (ft) 150 ft  Range of Motion/Exercises Active  Activity Response Tolerated fair  Mobility Referral Yes  Mobility visit 1 Mobility  Mobility Specialist Start Time (ACUTE ONLY) 0930  Mobility Specialist Stop Time (ACUTE ONLY) 0954  Mobility Specialist Time Calculation (min) (ACUTE ONLY) 24 min   Pt was found in bed and agreeable to ambulate after bathroom use. Pt had x1 brief standing rest break due to fatigue and low SPO2 of 87%. Productive coughing throughout session. At EOS returned to bed with all needs met. Call bell in reach.  Erminio Leos,  Mobility Specialist Can be reached via Secure Chat

## 2023-09-20 NOTE — Progress Notes (Signed)
 PROGRESS NOTE    [name]  FMW:[id number] DOB: [birth year] DOA: 09/17/2023 PCP: Verena Mems, MD   Brief Narrative:    68 year old female admitted with gradually worsening shortness of breath and productive cough and wheezing. Past medical history includessleep apnea, interstitial lung disease, malignant neoplasm of the right upper lung, CKD 3a, diabetes type 2 (insulin -dependent on 70/30), heart failure preserved EF 60 to 65%, prolonged QT, hepatic steatosis, CAD with carotid artery stenosis.  She was saturating at 60s on room air in the ER.  She was given steroids continuous nebs and azithromycin  and admitted to [facility] hospitalist.  She was admitted with a diagnosis of COPD exacerbation and AHRF in the setting of right upper lobe lung malignancy.  Assessment & Plan:  Principal Problem:   Acute respiratory failure with hypoxia (HCC) Active Problems:   Acute hypoxic respiratory failure (HCC)   COPD with acute exacerbation (HCC)   Cancer of upper lobe of right lung (HCC)   Insulin  dependent type 2 diabetes mellitus (HCC)   Hypertension associated with diabetes (HCC)   Acute hypoxic respiratory failure secondary to COPD exacerbation: Improving. in the setting of malignant neoplasm of the right upper lobe  Continue doxycycline  Continue prednisone  40 mg daily, Dced IV steroids Continue inhalational bronchodilator therapy Goal O2 saturation between 88-92% Chest x-ray shows -Stable abnormal right upper lobe and adjacent right paratracheal mediastinal contour since last month status post radiation for lung cancer. 6 minute walk test to be done before discharge Continue with mucinex    Chronic Diastolic heart failure: NYHA III, appears euvolemic,on aldactone    Type 2 diabetes with hyperglycemia: Appreciate diabetic coordinator's input.   Continue with Semglee  35 units and NovoLog  5 units 3 times daily. SSI Carb controlled diet   Stage IIIa CKD: at baseline monitor closely    History of CAD: continue aspirin  and atenolol  Repatha  statin and cilostazol .   Hypertension: on Norvasc  and Tenormin    Hypothyroidism: on synthroid    Class II obesity,POA: As evidenced by BMI of almost 38.   DVT prophylaxis: enoxaparin  (LOVENOX ) injection 40 mg Start: 09/17/23 2200     Code Status: Full Code Family Communication:  None at the bedside Status is: Inpatient Remains inpatient appropriate because: AHRF,COPD exacerbation    Subjective:  Feels better. She was getting breathing treatment. She is on 2 L nasal cannula. She did work with PT yesterday. Complaining of frequent coughing spells  Examination:  General exam: Appears calm and comfortable, nasal oxygen cannula in place, have frequent coughing spells Respiratory system: Slight tachypnea, b/l expiratory wheezing Cardiovascular system: S1 & S2 heard, RRR. No JVD, murmurs, rubs, gallops or clicks. No pedal edema. Gastrointestinal system: Abdomen is nondistended, soft and nontender. No organomegaly or masses felt. Normal bowel sounds heard. Central nervous system: Alert and oriented. No focal neurological deficits. Extremities: Symmetric 5 x 5 power. Skin: No rashes, lesions or ulcers Psychiatry: Judgement and insight appear normal. Mood & affect appropriate.       Diet Orders (From admission, onward)     Start     Ordered   09/17/23 0759  Diet heart healthy/carb modified Room service appropriate? Yes; Fluid consistency: Thin  Diet effective now       Question Answer Comment  Diet-HS Snack? Nothing   Room service appropriate? Yes   Fluid consistency: Thin      09/17/23 0800            Objective: Vitals:   09/19/23 2042 09/20/23 0214 09/20/23 0512 09/20/23 0855  BP: ROLLEN)  142/64  (!) 116/59   Pulse: 84  74   Resp: 18  16   Temp: 98.2 F (36.8 C)  97.7 F (36.5 C)   TempSrc: Oral  Oral   SpO2: 96% 96% 96% 94%  Weight:      Height:        Intake/Output Summary (Last 24 hours) at 09/20/2023  0907 Last data filed at 09/19/2023 1200 Gross per 24 hour  Intake 360 ml  Output --  Net 360 ml   Filed Weights   09/17/23 0852  Weight: 103.4 kg    Scheduled Meds:  amLODipine   10 mg Oral Daily   atenolol   100 mg Oral Daily   benzonatate   200 mg Oral TID   budesonide  (PULMICORT ) nebulizer solution  0.5 mg Nebulization Q12H   cilostazol   100 mg Oral BID   dextromethorphan -guaiFENesin   1 tablet Oral BID   diclofenac  Sodium  1 Application Topical BID   dicyclomine   10 mg Oral TID AC   doxycycline   100 mg Oral Q12H   enoxaparin  (LOVENOX ) injection  40 mg Subcutaneous Q24H   insulin  aspart  0-5 Units Subcutaneous QHS   insulin  aspart  0-6 Units Subcutaneous TID WC   insulin  aspart  5 Units Subcutaneous TID WC   insulin  glargine-yfgn  35 Units Subcutaneous Daily   ipratropium-albuterol   3 mL Nebulization Q6H   levothyroxine   100 mcg Oral Q0600   predniSONE   40 mg Oral Q breakfast   Ensure Max Protein  11 oz Oral Daily   rosuvastatin   40 mg Oral Daily   sertraline   50 mg Oral Daily   sodium chloride  HYPERTONIC  4 mL Nebulization Daily   spironolactone   25 mg Oral Daily   umeclidinium bromide   1 puff Inhalation Daily   Continuous Infusions:  Nutritional status Signs/Symptoms: estimated needs Interventions: Refer to RD note for recommendations Body mass index is 37.94 kg/m.  Data Reviewed:   CBC: Recent Labs  Lab 09/17/23 0323 09/18/23 0434 09/19/23 0417 09/20/23 0410  WBC 5.5 10.2 11.4* 11.1*  NEUTROABS  --   --   --  8.4*  HGB 14.3 12.7 12.9 13.0  HCT 45.1 41.1 41.5 40.2  MCV 84.9 85.6 84.5 83.2  PLT 273 242 291 299   Basic Metabolic Panel: Recent Labs  Lab 09/17/23 0323 09/18/23 0434 09/19/23 0417 09/20/23 0410  NA 134* 130* 128* 130*  K 3.6 4.4 4.9 3.9  CL 99 99 95* 96*  CO2 25 22 23 25   GLUCOSE 185* 308* 383* 315*  BUN 10 13 22 23   CREATININE 1.07* 0.87 0.99 1.27*  CALCIUM  9.5 9.5 9.3 9.2   GFR: Estimated Creatinine Clearance: 49.9 mL/min (A)  (by C-G formula based on SCr of 1.27 mg/dL (H)). Liver Function Tests: Recent Labs  Lab 09/19/23 0417  AST 15  ALT 13  ALKPHOS 80  BILITOT 0.6  PROT 8.1  ALBUMIN  3.4*   No results for input(s): LIPASE, AMYLASE in the last 168 hours. No results for input(s): AMMONIA in the last 168 hours. Coagulation Profile: No results for input(s): INR, PROTIME in the last 168 hours. Cardiac Enzymes: No results for input(s): CKTOTAL, CKMB, CKMBINDEX, TROPONINI in the last 168 hours. BNP (last 3 results) No results for input(s): PROBNP in the last 8760 hours. HbA1C: Recent Labs    09/17/23 1106  HGBA1C 7.8*   CBG: Recent Labs  Lab 09/19/23 0734 09/19/23 1158 09/19/23 1726 09/19/23 2124 09/20/23 0811  GLUCAP 362* 380* 318* 395* 249*  Lipid Profile: No results for input(s): CHOL, HDL, LDLCALC, TRIG, CHOLHDL, LDLDIRECT in the last 72 hours. Thyroid  Function Tests: No results for input(s): TSH, T4TOTAL, FREET4, T3FREE, THYROIDAB in the last 72 hours. Anemia Panel: No results for input(s): VITAMINB12, FOLATE, FERRITIN, TIBC, IRON, RETICCTPCT in the last 72 hours. Sepsis Labs: No results for input(s): PROCALCITON, LATICACIDVEN in the last 168 hours.  Recent Results (from the past 240 hours)  Resp panel by RT-PCR (RSV, Flu A&B, Covid) Anterior Nasal Swab     Status: None   Collection Time: 09/17/23  3:22 AM   Specimen: Anterior Nasal Swab  Result Value Ref Range Status   SARS Coronavirus 2 by RT PCR NEGATIVE NEGATIVE Final    Comment: (NOTE) SARS-CoV-2 target nucleic acids are NOT DETECTED.  The SARS-CoV-2 RNA is generally detectable in upper respiratory specimens during the acute phase of infection. The lowest concentration of SARS-CoV-2 viral copies this assay can detect is 138 copies/mL. A negative result does not preclude SARS-Cov-2 infection and should not be used as the sole basis for treatment or other patient  management decisions. A negative result may occur with  improper specimen collection/handling, submission of specimen other than nasopharyngeal swab, presence of viral mutation(s) within the areas targeted by this assay, and inadequate number of viral copies(<138 copies/mL). A negative result must be combined with clinical observations, patient history, and epidemiological information. The expected result is Negative.  Fact Sheet for Patients:  BloggerCourse.com  Fact Sheet for Healthcare Providers:  SeriousBroker.it  This test is no t yet approved or cleared by the [place]  FDA and  has been authorized for detection and/or diagnosis of SARS-CoV-2 by FDA under an Emergency Use Authorization (EUA). This EUA will remain  in effect (meaning this test can be used) for the duration of the COVID-19 declaration under Section 564(b)(1) of the Act, 21 U.S.C.section 360bbb-3(b)(1), unless the authorization is terminated  or revoked sooner.       Influenza A by PCR NEGATIVE NEGATIVE Final   Influenza B by PCR NEGATIVE NEGATIVE Final    Comment: (NOTE) The Xpert Xpress SARS-CoV-2/FLU/RSV plus assay is intended as an aid in the diagnosis of influenza from Nasopharyngeal swab specimens and should not be used as a sole basis for treatment. Nasal washings and aspirates are unacceptable for Xpert Xpress SARS-CoV-2/FLU/RSV testing.  Fact Sheet for Patients: BloggerCourse.com  Fact Sheet for Healthcare Providers: SeriousBroker.it  This test is not yet approved or cleared by the [place]  FDA and has been authorized for detection and/or diagnosis of SARS-CoV-2 by FDA under an Emergency Use Authorization (EUA). This EUA will remain in effect (meaning this test can be used) for the duration of the COVID-19 declaration under Section 564(b)(1) of the Act, 21 U.S.C. section 360bbb-3(b)(1),  unless the authorization is terminated or revoked.     Resp Syncytial Virus by PCR NEGATIVE NEGATIVE Final    Comment: (NOTE) Fact Sheet for Patients: BloggerCourse.com  Fact Sheet for Healthcare Providers: SeriousBroker.it  This test is not yet approved or cleared by the [place]  FDA and has been authorized for detection and/or diagnosis of SARS-CoV-2 by FDA under an Emergency Use Authorization (EUA). This EUA will remain in effect (meaning this test can be used) for the duration of the COVID-19 declaration under Section 564(b)(1) of the Act, 21 U.S.C. section 360bbb-3(b)(1), unless the authorization is terminated or revoked.  Performed at [facility], [address] [place], [place] [address]  Radiology Studies: No results found.      LOS: 3 days   Time spent= 42 mins    Deliliah Room, MD [facility]  If 7PM-7AM, please contact night-coverage  09/20/2023, 9:07 AM

## 2023-09-20 NOTE — Plan of Care (Signed)

## 2023-09-20 NOTE — Plan of Care (Signed)

## 2023-09-21 DIAGNOSIS — J9601 Acute respiratory failure with hypoxia: Secondary | ICD-10-CM | POA: Diagnosis not present

## 2023-09-21 LAB — CBC
HCT: 45.3 % (ref 36.0–46.0)
Hemoglobin: 14.2 g/dL (ref 12.0–15.0)
MCH: 26.3 pg (ref 26.0–34.0)
MCHC: 31.3 g/dL (ref 30.0–36.0)
MCV: 83.9 fL (ref 80.0–100.0)
Platelets: 305 K/uL (ref 150–400)
RBC: 5.4 MIL/uL — ABNORMAL HIGH (ref 3.87–5.11)
RDW: 13.3 % (ref 11.5–15.5)
WBC: 9.5 K/uL (ref 4.0–10.5)
nRBC: 0 % (ref 0.0–0.2)

## 2023-09-21 LAB — BASIC METABOLIC PANEL WITH GFR
Anion gap: 11 (ref 5–15)
BUN: 23 mg/dL (ref 8–23)
CO2: 22 mmol/L (ref 22–32)
Calcium: 9 mg/dL (ref 8.9–10.3)
Chloride: 96 mmol/L — ABNORMAL LOW (ref 98–111)
Creatinine, Ser: 1.01 mg/dL — ABNORMAL HIGH (ref 0.44–1.00)
GFR, Estimated: >60 mL/min (ref >60)
Glucose, Bld: 204 mg/dL — ABNORMAL HIGH (ref 70–99)
Potassium: 4 mmol/L (ref 3.5–5.1)
Sodium: 129 mmol/L — ABNORMAL LOW (ref 135–145)

## 2023-09-21 LAB — GLUCOSE, CAPILLARY
Glucose-Capillary: 197 mg/dL — ABNORMAL HIGH (ref 70–99)
Glucose-Capillary: 399 mg/dL — ABNORMAL HIGH (ref 70–99)

## 2023-09-21 MED ORDER — DOXYCYCLINE HYCLATE 100 MG PO TABS: 100.0000 mg | ORAL_TABLET | Freq: Two times a day (BID) | ORAL | 0 refills | Status: DC

## 2023-09-21 MED ORDER — PREDNISONE 20 MG PO TABS: 40.0000 mg | ORAL_TABLET | Freq: Every day | ORAL | 0 refills | Status: AC

## 2023-09-21 MED ORDER — DM-GUAIFENESIN ER 30-600 MG PO TB12: 1.0000 | ORAL_TABLET | Freq: Two times a day (BID) | ORAL | 0 refills | Status: AC | PRN

## 2023-09-21 NOTE — Discharge Summary (Addendum)
 Physician Discharge Summary   Patient: [name] MRN: [id number] DOB: 22-Jun-1955  Admit date:     09/17/2023  Discharge date: 09/21/23  Discharge Physician: Deliliah Room   PCP: Verena Mems, MD   Recommendations at discharge:    Follow up with your PCP and pulmonologist Use oxygen at 2 L/ min on exertion only. Check your oxygen saturations with the help of a pulse oximeter. Goal O2 saturations between 88-92%  Discharge Diagnoses: Principal Problem:   Acute respiratory failure with hypoxia (HCC) Active Problems:   Acute hypoxic respiratory failure (HCC)   COPD with acute exacerbation (HCC)   Cancer of upper lobe of right lung (HCC)   Insulin  dependent type 2 diabetes mellitus (HCC)   Hypertension associated with diabetes [facility])   Hospital Course:  68 year old female admitted with gradually worsening shortness of breath and productive cough and wheezing.   Acute hypoxic respiratory failure secondary to COPD exacerbation: Improving. in the setting of malignant neoplasm of the right upper lobe  Continue doxycycline  Continue prednisone  40 mg daily, Dced IV steroids Continue inhalational bronchodilator therapy Goal O2 saturation between 88-92% Chest x-ray shows -Stable abnormal right upper lobe and adjacent right paratracheal mediastinal contour since last month status post radiation for lung cancer. 6 minute walk test done and she was needing 2 L/min oxygen on exertion only. Home oxygen arranged. Continue with mucinex  Out patient follow up with pulmonology.   Chronic Diastolic heart failure: NYHA III, appears euvolemic,on aldactone    Type 2 diabetes with hyperglycemia: Appreciate diabetic coordinator's input.   Continue with home insulin  regimen on discharge. Carb controlled diet   Stage IIIa CKD: at baseline monitor closely   History of CAD: continue aspirin  and atenolol , Repatha , statin and cilostazol .   Hypertension: on Norvasc  and Tenormin    Hypothyroidism:  on synthroid    Class II obesity,POA: As evidenced by BMI of almost 38. Patient is on Tirzepatide.     Consultants: None Procedures performed: None  Disposition: Home Diet recommendation:  Cardiac and Carb modified diet DISCHARGE MEDICATION: Allergies as of 09/21/2023       Reactions   Strawberry Extract Anaphylaxis   Cefdinir Other (See Comments)   Unknown reaction   Roxicodone  [oxycodone  Hcl] Nausea And Vomiting   Sulfa Antibiotics Itching, Swelling   Angioedema   Topamax  [topiramate ] Nausea And Vomiting, Other (See Comments)   Dizziness Blurred Vision   Whole Blood Other (See Comments)   Jehova's witness   Zestril  [lisinopril ] Itching, Swelling   Angioedema   Ensure Nausea Only, Other (See Comments)   Stomach pain   Glucophage  [metformin ] Other (See Comments)   Caused kidney damage   Trazodone Other (See Comments)   Tremors    Cymbalta [duloxetine Hcl] Other (See Comments)   Dizziness   Hydromet [hydrocodone  Bit-homatrop Mbr] Nausea And Vomiting   Lunesta [eszopiclone] Other (See Comments)   Bad taste in mouth    Penicillins Rash, Other (See Comments)   Paralyzed (per patient)   Ultram [tramadol] Other (See Comments)   Hallucinations        Medication List     TAKE these medications    acetaminophen -codeine  300-30 MG tablet Commonly known as: TYLENOL  #3 Take 1 tablet by mouth every 6 (six) hours as needed.   albuterol  108 (90 Base) MCG/ACT inhaler Commonly known as: VENTOLIN  HFA Inhale 2 puffs into the lungs every 6 (six) hours as needed for wheezing or shortness of breath.   amLODipine  10 MG tablet Commonly known as: NORVASC  Take 10 mg  by mouth daily.   aspirin  EC 81 MG tablet Take 1 tablet (81 mg total) by mouth daily. Swallow whole.   atenolol  100 MG tablet Commonly known as: TENORMIN  Take 1 tablet (100 mg total) by mouth daily.   cilostazol  100 MG tablet Commonly known as: PLETAL  Take 1 tablet (100 mg total) by mouth 2 (two) times  daily.   clonazePAM  0.5 MG tablet Commonly known as: KLONOPIN  Take 0.25-0.5 mg by mouth daily as needed for anxiety.   dextromethorphan -guaiFENesin  30-600 MG 12hr tablet Commonly known as: MUCINEX  DM Take 1 tablet by mouth 2 (two) times daily as needed for cough.   Diclofenac  Sodium 3 % Gel Apply 1 Application topically 2 (two) times daily as needed (back pain).   dicyclomine  10 MG capsule Commonly known as: BENTYL  Take 10 mg by mouth 3 (three) times daily before meals.   doxycycline  100 MG tablet Commonly known as: VIBRA -TABS Take 1 tablet (100 mg total) by mouth 2 (two) times daily.   estradiol  0.1 MG/GM vaginal cream Commonly known as: ESTRACE  Apply 3 times weekly by using a pea-sized amount applied to fingertip   hydrOXYzine  25 MG tablet Commonly known as: ATARAX  Take 1 tablet (25 mg total) by mouth 3 (three) times daily as needed for anxiety.   insulin  aspart protamine  - aspart (70-30) 100 UNIT/ML FlexPen Commonly known as: NOVOLOG  70/30 MIX Inject 40-60 Units into the skin See admin instructions. Inject 60 units into the skin before breakfast and inject 40 units before dinner.   ipratropium-albuterol  0.5-2.5 (3) MG/3ML Soln Commonly known as: DUONEB Take 3 mLs by nebulization every 8 (eight) hours as needed (for shortness of breathing or wheezing).   levothyroxine  100 MCG tablet Commonly known as: SYNTHROID  Take 100 mcg by mouth daily before breakfast.   Mounjaro 2.5 MG/0.5ML Pen Generic drug: tirzepatide Inject 2.5 mg into the skin every Monday.   predniSONE  20 MG tablet Commonly known as: DELTASONE  Take 2 tablets (40 mg total) by mouth daily with breakfast. Start taking on: September 22, 2023   Repatha  SureClick 140 MG/ML Soaj Generic drug: Evolocumab  INJECT 1 PEN INTO THE SKIN EVERY 14 (FOURTEEN) DAYS. What changed:  how much to take when to take this additional instructions   rosuvastatin  40 MG tablet Commonly known as: CRESTOR  Take 1 tablet (40 mg  total) by mouth daily.   sertraline  50 MG tablet Commonly known as: ZOLOFT  Take 50 mg by mouth daily.   Spiriva  Respimat 2.5 MCG/ACT Aers Generic drug: Tiotropium Bromide  Monohydrate Inhale 2 puffs into the lungs daily.   spironolactone  25 MG tablet Commonly known as: ALDACTONE  Take 1 tablet (25 mg total) by mouth daily.   VITAMIN B-12 PO Take 1 tablet by mouth daily.   VITAMIN C PO Take 1 tablet by mouth daily.   VITAMIN D -3 PO Take 1 capsule by mouth daily.               Durable Medical Equipment  (From admission, onward)           Start     Ordered   09/21/23 1004  For home use only DME oxygen  Once       Question Answer Comment  Length of Need 6 Months   Mode or (Route) Nasal cannula   Liters per Minute 2   Frequency Continuous (stationary and portable oxygen unit needed)   Oxygen delivery system Gas      09/21/23 1004  Follow-up Information     [facility] Follow up.   Contact information: [facility])  [address] [place], [place] [address] [phone]        Verena Mems, MD. Schedule an appointment as soon as possible for a visit in 1 week(s).   Specialty: Family Medicine Contact information: [address] [place] [address] [phone]         Shelah Lamar RAMAN, MD. Schedule an appointment as soon as possible for a visit in 2 week(s).   Specialty: Pulmonary Disease Contact information: [address] [place] [address] [phone]                Discharge Exam: Fredricka Weights   09/17/23 0852  Weight: 103.4 kg   Constitutional: NAD, calm, comfortable Eyes: PERRL, lids and conjunctivae normal ENMT: Mucous membranes are moist. Posterior pharynx clear of any exudate or lesions.Normal dentition.  Neck: normal, supple, no masses, no thyromegaly Respiratory: clear to auscultation bilaterally, no wheezing, no crackles. Normal respiratory effort. No  accessory muscle use.  Cardiovascular: Regular rate and rhythm, no murmurs / rubs / gallops. No extremity edema. 2+ pedal pulses. No carotid bruits.  Abdomen: no tenderness, no masses palpated. No hepatosplenomegaly. Bowel sounds positive.  Musculoskeletal: no clubbing / cyanosis. No joint deformity upper and lower extremities. Good ROM, no contractures. Normal muscle tone.  Skin: no rashes, lesions, ulcers. No induration Neurologic: CN 2-12 grossly intact. Sensation intact, DTR normal. Strength 5/5 x all 4 extremities.  Psychiatric: Normal judgment and insight. Alert and oriented x 3. Normal mood.    Condition at discharge: good  The results of significant diagnostics from this hospitalization (including imaging, microbiology, ancillary and laboratory) are listed below for reference.   Imaging Studies: DG Chest 2 View Result Date: 09/17/2023 CLINICAL DATA:  68 year old female with shortness of breath. Lung cancer, now status post radiation treatment. EXAM: CHEST - 2 VIEW COMPARISON:  CTA chest 06/16/2023. Chest radiographs 09/05/2023 and earlier. FINDINGS: Upright AP and lateral views ear 0335 hours. Right upper lung and right paratracheal mediastinal architectural distortion redemonstrated and stable since last month. Lung volumes and mediastinal contours are stable. Stable trachea. No pneumothorax, pleural effusion, left lung consolidation, or new pulmonary opacity compared to last month. Calcified aortic atherosclerosis. Negative visible bowel gas. Stable visualized osseous structures. IMPRESSION: Stable abnormal right upper lobe and adjacent right paratracheal mediastinal contour since last month status post radiation for lung cancer. No new cardiopulmonary abnormality identified. Electronically Signed   By: VEAR Hurst M.D.   On: 09/17/2023 05:39    Microbiology: Results for orders placed or performed during the hospital encounter of 09/17/23  Resp panel by RT-PCR (RSV, Flu A&B, Covid) Anterior  Nasal Swab     Status: None   Collection Time: 09/17/23  3:22 AM   Specimen: Anterior Nasal Swab  Result Value Ref Range Status   SARS Coronavirus 2 by RT PCR NEGATIVE NEGATIVE Final    Comment: (NOTE) SARS-CoV-2 target nucleic acids are NOT DETECTED.  The SARS-CoV-2 RNA is generally detectable in upper respiratory specimens during the acute phase of infection. The lowest concentration of SARS-CoV-2 viral copies this assay can detect is 138 copies/mL. A negative result does not preclude SARS-Cov-2 infection and should not be used as the sole basis for treatment or other patient management decisions. A negative result may occur with  improper specimen collection/handling, submission of specimen other than nasopharyngeal swab, presence of viral mutation(s) within the areas  targeted by this assay, and inadequate number of viral copies(<138 copies/mL). A negative result must be combined with clinical observations, patient history, and epidemiological information. The expected result is Negative.  Fact Sheet for Patients:  BloggerCourse.com  Fact Sheet for Healthcare Providers:  SeriousBroker.it  This test is no t yet approved or cleared by the [place]  FDA and  has been authorized for detection and/or diagnosis of SARS-CoV-2 by FDA under an Emergency Use Authorization (EUA). This EUA will remain  in effect (meaning this test can be used) for the duration of the COVID-19 declaration under Section 564(b)(1) of the Act, 21 U.S.C.section 360bbb-3(b)(1), unless the authorization is terminated  or revoked sooner.       Influenza A by PCR NEGATIVE NEGATIVE Final   Influenza B by PCR NEGATIVE NEGATIVE Final    Comment: (NOTE) The Xpert Xpress SARS-CoV-2/FLU/RSV plus assay is intended as an aid in the diagnosis of influenza from Nasopharyngeal swab specimens and should not be used as a sole basis for treatment. Nasal washings  and aspirates are unacceptable for Xpert Xpress SARS-CoV-2/FLU/RSV testing.  Fact Sheet for Patients: BloggerCourse.com  Fact Sheet for Healthcare Providers: SeriousBroker.it  This test is not yet approved or cleared by the [place]  FDA and has been authorized for detection and/or diagnosis of SARS-CoV-2 by FDA under an Emergency Use Authorization (EUA). This EUA will remain in effect (meaning this test can be used) for the duration of the COVID-19 declaration under Section 564(b)(1) of the Act, 21 U.S.C. section 360bbb-3(b)(1), unless the authorization is terminated or revoked.     Resp Syncytial Virus by PCR NEGATIVE NEGATIVE Final    Comment: (NOTE) Fact Sheet for Patients: BloggerCourse.com  Fact Sheet for Healthcare Providers: SeriousBroker.it  This test is not yet approved or cleared by the [place]  FDA and has been authorized for detection and/or diagnosis of SARS-CoV-2 by FDA under an Emergency Use Authorization (EUA). This EUA will remain in effect (meaning this test can be used) for the duration of the COVID-19 declaration under Section 564(b)(1) of the Act, 21 U.S.C. section 360bbb-3(b)(1), unless the authorization is terminated or revoked.  Performed at [facility], [address] [place], [place] [address]     Labs: CBC: Recent Labs  Lab 09/17/23 [phone] 09/18/23 0434 09/19/23 0417 09/20/23 0410 09/21/23 0420  WBC 5.5 10.2 11.4* 11.1* 9.5  NEUTROABS  --   --   --  8.4*  --   HGB 14.3 12.7 12.9 13.0 14.2  HCT 45.1 41.1 41.5 40.2 45.3  MCV 84.9 85.6 84.5 83.2 83.9  PLT 273 242 291 299 305   Basic Metabolic Panel: Recent Labs  Lab 09/17/23 0323 09/18/23 0434 09/19/23 0417 09/20/23 0410 09/21/23 0420  NA 134* 130* 128* 130* 129*  K 3.6 4.4 4.9 3.9 4.0  CL 99 99 95* 96* 96*  CO2 25 22 23 25 22   GLUCOSE 185* 308* 383*  315* 204*  BUN 10 13 22 23 23   CREATININE 1.07* 0.87 0.99 1.27* 1.01*  CALCIUM  9.5 9.5 9.3 9.2 9.0   Liver Function Tests: Recent Labs  Lab 09/19/23 0417  AST 15  ALT 13  ALKPHOS 80  BILITOT 0.6  PROT 8.1  ALBUMIN  3.4*   CBG: Recent Labs  Lab 09/20/23 1137 09/20/23 1634 09/20/23 2112 09/21/23 0748 09/21/23 1136  GLUCAP 221* 301* 350* 197* 399*    Discharge time spent: 45 minutes.  Signed: Deliliah Room, MD [facility] 09/21/2023

## 2023-09-21 NOTE — Plan of Care (Signed)

## 2023-09-21 NOTE — TOC Progression Note (Signed)
 Transition of Care [facility]) - Progression Note    Patient Details  Name: [name] MRN: [id number] Date of Birth: Apr 08, 1955  [facility] of [facility]) CM/SW Contact  Sonda Manuella Quill, RN Phone Number: 09/21/2023, 12:48 PM  Clinical Narrative:    Orders received for home oxygen; spoke w/ pt in room; she agreed to receive service; she does not have agency preference; referral given to Jermaine at [facility]; travel tank will be delivered to room; pt notified; agency contact info placed in follow up provider section of d/c instructions.   Expected Discharge Plan: Home/Self Care Barriers to Discharge: Continued Medical Work up               Expected Discharge Plan and Services In-house Referral: NA Discharge Planning Services: CM Consult Post Acute Care Choice: NA Living arrangements for the past 2 months: Apartment                 DME Arranged: N/A DME Agency: NA       HH Arranged: NA HH Agency: NA         Social Drivers of Health (SDOH) Interventions SDOH Screenings   Food Insecurity: No Food Insecurity (09/17/2023)  Housing: Low Risk  (09/17/2023)  Transportation Needs: No Transportation Needs (09/17/2023)  Utilities: Not At Risk (09/17/2023)  Depression (PHQ2-9): Low Risk  (11/29/2018)  Financial Resource Strain: Low Risk  (02/07/2022)   Received from [facility]  Social Connections: Unknown (09/17/2023)  Stress: No Stress Concern Present (02/07/2022)   Received from [facility]  Tobacco Use: Medium Risk (09/17/2023)    Readmission Risk Interventions    09/17/2023   11:55 AM 06/17/2023   12:04 PM  Readmission Risk Prevention Plan  Transportation Screening Complete Complete  PCP or Specialist Appt within 5-7 Days  Complete  PCP or Specialist Appt within 3-5 Days Complete   Home Care Screening  Complete  Medication Review (RN CM)  Complete  Palliative Care Screening Not Applicable   Medication Review (RN Care Manager) Complete

## 2023-09-21 NOTE — Progress Notes (Signed)
SATURATION QUALIFICATIONS: (This note is used to comply with regulatory documentation for home oxygen) ? ?Patient Saturations on Room Air at Rest = 90% ? ?Patient Saturations on Room Air while Ambulating = 86% ? ?Patient Saturations on 2 Liters of oxygen while Ambulating = 93% ? ?Please briefly explain why patient needs home oxygen: ?

## 2023-09-21 NOTE — Discharge Instructions (Addendum)
 You have been prescribed oxygen. You are needing 2 L oxygen on exertion only at this time.

## 2023-09-21 NOTE — TOC Transition Note (Signed)
 Transition of Care [facility]) - Discharge Note   Patient Details  Name: [name] MRN: [id number] Date of Birth: 28-Aug-1955  [facility]) CM/SW Contact:  Sonda Manuella Quill, RN Phone Number: 09/21/2023, 1:28 PM   Clinical Narrative:    D/C orders received; home oxygen arranged w/ [employer]; confirmed travel tank has been delivered to [facility]; no TOC needs.   Final next level of care: Home/Self Care Barriers to Discharge: No Barriers Identified   Patient Goals and CMS Choice Patient states their goals for this hospitalization and ongoing recovery are:: Home with self CMS Medicare.gov Compare Post Acute Care list provided to:: Other (Comment Required) (NA) Choice offered to / list presented to : NA [employer] ownership interest in [facility].provided to:: Parent NA    Discharge Placement                       Discharge Plan and Services Additional resources added to the After Visit Summary for   In-house Referral: NA Discharge Planning Services: CM Consult Post Acute Care Choice: NA          DME Arranged: Oxygen DME Agency: [employer] Date DME Agency Contacted: 09/21/23 Time DME Agency Contacted: 1328 Representative spoke with at DME Agency: [place] HH Arranged: NA HH Agency: NA        Social Drivers of Health (SDOH) Interventions SDOH Screenings   Food Insecurity: No Food Insecurity (09/17/2023)  Housing: Low Risk  (09/17/2023)  Transportation Needs: No Transportation Needs (09/17/2023)  Utilities: Not At Risk (09/17/2023)  Depression (PHQ2-9): Low Risk  (11/29/2018)  Financial Resource Strain: Low Risk  (02/07/2022)   Received from [facility]  Social Connections: Unknown (09/17/2023)  Stress: No Stress Concern Present (02/07/2022)   Received from [facility]  Tobacco Use: Medium Risk (09/17/2023)     Readmission Risk Interventions    09/17/2023   11:55 AM 06/17/2023   12:04 PM  Readmission Risk Prevention Plan   Transportation Screening Complete Complete  PCP or Specialist Appt within 5-7 Days  Complete  PCP or Specialist Appt within 3-5 Days Complete   Home Care Screening  Complete  Medication Review (RN CM)  Complete  Palliative Care Screening Not Applicable   Medication Review (RN Care Manager) Complete

## 2023-09-21 NOTE — Progress Notes (Signed)
 Mobility Specialist - Progress Note   09/21/23 1049  Mobility  Activity Ambulated with assistance  Level of Assistance Independent after set-up  Assistive Device None  Distance Ambulated (ft) 120 ft  Range of Motion/Exercises Active  Activity Response Tolerated well  Mobility Referral Yes  Mobility visit 1 Mobility  Mobility Specialist Start Time (ACUTE ONLY) [id number]  Mobility Specialist Stop Time (ACUTE ONLY) 0941  Mobility Specialist Time Calculation (min) (ACUTE ONLY) 19 min   Received in bed and agreed to mobility. On 2L throughout session, upon returning to room pt saturation was 86%, recovered under a minute to 93%.   Left in chair with all needs met.  Cyndee Ada Mobility Specialist

## 2023-09-23 ENCOUNTER — Ambulatory Visit (HOSPITAL_COMMUNITY)
Admission: RE | Admit: 2023-09-23 | Discharge: 2023-09-23 | Disposition: A | Discharge status: Home / Self Care | Source: Ambulatory Visit | Attending: Radiation Oncology | Admitting: Radiation Oncology

## 2023-09-23 ENCOUNTER — Inpatient Hospital Stay
Admission: RE | Admit: 2023-09-23 | Discharge: 2023-09-23 | Discharge status: Home / Self Care | Source: Ambulatory Visit | Attending: Nurse Practitioner

## 2023-09-23 DIAGNOSIS — K7460 Unspecified cirrhosis of liver: Secondary | ICD-10-CM | POA: Diagnosis not present

## 2023-09-23 DIAGNOSIS — R590 Localized enlarged lymph nodes: Secondary | ICD-10-CM | POA: Diagnosis not present

## 2023-09-23 DIAGNOSIS — J479 Bronchiectasis, uncomplicated: Secondary | ICD-10-CM | POA: Diagnosis not present

## 2023-09-23 DIAGNOSIS — K7581 Nonalcoholic steatohepatitis (NASH): Secondary | ICD-10-CM

## 2023-09-23 DIAGNOSIS — J984 Other disorders of lung: Secondary | ICD-10-CM | POA: Diagnosis not present

## 2023-09-23 DIAGNOSIS — K8020 Calculus of gallbladder without cholecystitis without obstruction: Secondary | ICD-10-CM | POA: Diagnosis not present

## 2023-09-23 DIAGNOSIS — C3411 Malignant neoplasm of upper lobe, right bronchus or lung: Secondary | ICD-10-CM | POA: Insufficient documentation

## 2023-09-23 DIAGNOSIS — R918 Other nonspecific abnormal finding of lung field: Secondary | ICD-10-CM | POA: Diagnosis not present

## 2023-09-23 DIAGNOSIS — K7469 Other cirrhosis of liver: Secondary | ICD-10-CM

## 2023-09-26 DIAGNOSIS — J439 Emphysema, unspecified: Secondary | ICD-10-CM | POA: Diagnosis not present

## 2023-09-26 DIAGNOSIS — J479 Bronchiectasis, uncomplicated: Secondary | ICD-10-CM | POA: Diagnosis not present

## 2023-09-26 DIAGNOSIS — N63 Unspecified lump in unspecified breast: Secondary | ICD-10-CM | POA: Diagnosis not present

## 2023-09-26 DIAGNOSIS — E871 Hypo-osmolality and hyponatremia: Secondary | ICD-10-CM | POA: Diagnosis not present

## 2023-09-26 DIAGNOSIS — E1165 Type 2 diabetes mellitus with hyperglycemia: Secondary | ICD-10-CM | POA: Diagnosis not present

## 2023-09-26 DIAGNOSIS — N644 Mastodynia: Secondary | ICD-10-CM | POA: Diagnosis not present

## 2023-09-26 DIAGNOSIS — M549 Dorsalgia, unspecified: Secondary | ICD-10-CM | POA: Diagnosis not present

## 2023-09-26 DIAGNOSIS — J849 Interstitial pulmonary disease, unspecified: Secondary | ICD-10-CM | POA: Diagnosis not present

## 2023-09-30 DIAGNOSIS — C3411 Malignant neoplasm of upper lobe, right bronchus or lung: Secondary | ICD-10-CM | POA: Diagnosis not present

## 2023-09-30 DIAGNOSIS — M549 Dorsalgia, unspecified: Secondary | ICD-10-CM | POA: Diagnosis not present

## 2023-09-30 DIAGNOSIS — E1121 Type 2 diabetes mellitus with diabetic nephropathy: Secondary | ICD-10-CM | POA: Diagnosis not present

## 2023-09-30 DIAGNOSIS — E871 Hypo-osmolality and hyponatremia: Secondary | ICD-10-CM | POA: Diagnosis not present

## 2023-09-30 NOTE — Progress Notes (Signed)
 Radiation Oncology         [phone] ________________________________  Name: [name] MRN: [id number]  Date: 10/01/2023  DOB: [birth year]  Follow-Up Visit Note  Outpatient  CC: Verena Mems, MD  Verena Mems, MD  Diagnosis and Prior Radiotherapy: No diagnosis found. ***   Cancer Staging  Cancer of upper lobe of right lung [facility]) Staging form: Lung, AJCC V9 - Clinical stage from 05/31/2023: Stage IA3 (cT1c, cN0, cM0) - Signed by Izell Domino, MD on 05/31/2023 Stage prefix: Initial diagnosis  Stage IA3 (cT1c, N0, M0) squamous cell carcinoma, NSCLC, of the RUL; s/p SBRT completed on 06/26/2023  ==========DELIVERED PLANS==========  First Treatment Date: 2023-06-17 Last Treatment Date: 2023-06-26   Plan Name: Lung_R_SBRT Site: Lung, Right Technique: SBRT/SRT-IMRT Mode: Photon Dose Per Fraction: 12 Gy Prescribed Dose (Delivered / Prescribed): 60 Gy / 60 Gy Prescribed Fxs (Delivered / Prescribed): 5 / 5  CHIEF COMPLAINT: Here for follow-up and surveillance of NSCLC cancer  Narrative:  The patient returns today for routine follow-up and to review recent imaging.  ***    Of note, she did present to the emergency room on 09/17/2023 with complaints of gradually worsening shortness of breath and a productive cough. She was subsequently admitted for a COPD exacerbation. Patient was discharged on 09/21/2023 on prednisone , doxycycline , and inhaler therapy.       CT of the chest on 09/23/2023 demonstrates a decreased size of the RUL, LLL, and LUL pulmonary nodules; decreased mediastinal and pericardiophrenic lymphadenopathy; extensive ground-glass with patchy opacities; interstitial thickening reticulations; and improvement in the bilateral ground-glass and patchy airspace opacity seen on prior examination.   ***                         ALLERGIES:  is allergic to strawberry extract, cefdinir, roxicodone  [oxycodone  hcl], sulfa antibiotics, topamax  [topiramate ], whole blood,  zestril  [lisinopril ], ensure, glucophage  [metformin ], trazodone, cymbalta [duloxetine hcl], hydromet [hydrocodone  bit-homatrop mbr], lunesta [eszopiclone], penicillins, and ultram [tramadol].  Meds: Current Outpatient Medications  Medication Sig Dispense Refill   acetaminophen -codeine  (TYLENOL  #3) 300-30 MG tablet Take 1 tablet by mouth every 6 (six) hours as needed.     albuterol  (VENTOLIN  HFA) 108 (90 Base) MCG/ACT inhaler Inhale 2 puffs into the lungs every 6 (six) hours as needed for wheezing or shortness of breath. 6.7 g 2   amLODipine  (NORVASC ) 10 MG tablet Take 10 mg by mouth daily.     Ascorbic Acid (VITAMIN C PO) Take 1 tablet by mouth daily.     aspirin  EC 81 MG tablet Take 1 tablet (81 mg total) by mouth daily. Swallow whole. 90 tablet 3   atenolol  (TENORMIN ) 100 MG tablet Take 1 tablet (100 mg total) by mouth daily. 90 tablet 3   Cholecalciferol  (VITAMIN D -3 PO) Take 1 capsule by mouth daily.     cilostazol  (PLETAL ) 100 MG tablet Take 1 tablet (100 mg total) by mouth 2 (two) times daily. 180 tablet 3   clonazePAM  (KLONOPIN ) 0.5 MG tablet Take 0.25-0.5 mg by mouth daily as needed for anxiety.     Cyanocobalamin  (VITAMIN B-12 PO) Take 1 tablet by mouth daily.     dextromethorphan -guaiFENesin  (MUCINEX  DM) 30-600 MG 12hr tablet Take 1 tablet by mouth 2 (two) times daily as needed for cough. 15 tablet 0   Diclofenac  Sodium 3 % GEL Apply 1 Application topically 2 (two) times daily as needed (back pain).     dicyclomine  (BENTYL ) 10 MG capsule Take 10 mg  by mouth 3 (three) times daily before meals.     doxycycline  (VIBRA -TABS) 100 MG tablet Take 1 tablet (100 mg total) by mouth 2 (two) times daily. 6 tablet 0   estradiol  (ESTRACE ) 0.1 MG/GM vaginal cream Apply 3 times weekly by using a pea-sized amount applied to fingertip (Patient not taking: Reported on 09/17/2023) 42.5 g 12   Evolocumab  (REPATHA  SURECLICK) 140 MG/ML SOAJ INJECT 1 PEN INTO THE SKIN EVERY 14 (FOURTEEN) DAYS. (Patient taking  differently: Inject 140 mg into the skin See admin instructions. Inject 1mL (140mg ) into the skin every other Monday.) 6 mL 3   hydrOXYzine  (ATARAX ) 25 MG tablet Take 1 tablet (25 mg total) by mouth 3 (three) times daily as needed for anxiety. 90 tablet 0   insulin  aspart protamine  - aspart (NOVOLOG  70/30 MIX) (70-30) 100 UNIT/ML FlexPen Inject 40-60 Units into the skin See admin instructions. Inject 60 units into the skin before breakfast and inject 40 units before dinner.     ipratropium-albuterol  (DUONEB) 0.5-2.5 (3) MG/3ML SOLN Take 3 mLs by nebulization every 8 (eight) hours as needed (for shortness of breathing or wheezing).     levothyroxine  (SYNTHROID ) 100 MCG tablet Take 100 mcg by mouth daily before breakfast.     predniSONE  (DELTASONE ) 20 MG tablet Take 2 tablets (40 mg total) by mouth daily with breakfast. 8 tablet 0   rosuvastatin  (CRESTOR ) 40 MG tablet Take 1 tablet (40 mg total) by mouth daily. 90 tablet 3   sertraline  (ZOLOFT ) 50 MG tablet Take 50 mg by mouth daily.     SPIRIVA  RESPIMAT 2.5 MCG/ACT AERS Inhale 2 puffs into the lungs daily.     spironolactone  (ALDACTONE ) 25 MG tablet Take 1 tablet (25 mg total) by mouth daily.     tirzepatide (MOUNJARO) 2.5 MG/0.5ML Pen Inject 2.5 mg into the skin every Monday.     No current facility-administered medications for this visit.    Physical Findings: The patient is in no acute distress. Patient is alert and oriented.  vitals were not taken for this visit. .      Lab Findings: Lab Results  Component Value Date   WBC 9.5 09/21/2023   HGB 14.2 09/21/2023   HCT 45.3 09/21/2023   MCV 83.9 09/21/2023   PLT 305 09/21/2023    Radiographic Findings: CT Chest Wo Contrast Result Date: 09/30/2023 CLINICAL DATA:  Lung cancer, follow-up.  * Tracking Code: BO * EXAM: CT CHEST WITHOUT CONTRAST TECHNIQUE: Multidetector CT imaging of the chest was performed following the standard protocol without IV contrast. RADIATION DOSE REDUCTION: This  exam was performed according to the departmental dose-optimization program which includes automated exposure control, adjustment of the mA and/or kV according to patient size and/or use of iterative reconstruction technique. COMPARISON:  Multiple priors including CT Jun 16, 2023 and PET-CT May 28, 2023 FINDINGS: Cardiovascular: Aortic atherosclerosis. Ectasia of the ascending thoracic aorta measuring 4.2 cm, unchanged. Enlarged main pulmonary artery. Three-vessel coronary artery calcifications. Calcifications of the mitral annulus. Normal size heart. No significant pericardial effusion/thickening. Mediastinum/Nodes: No suspicious thyroid  nodule. Decreased mediastinal and pericardiophrenic lymphadenopathy. For reference: -low right paratracheal lymph node measures 9 mm in short axis on image 48/2 previously 14 mm -right pericardiophrenic lymph node measures 7 mm in short axis on image 99/2 previously 9 mm. Hilar lymph nodes not well evaluated on this noncontrast enhanced examination. Soft tissue thickening along the right para and suprahilar mediastinum. Lungs/Pleura: Right upper lobe soft tissue nodule measures 13 x 10 mm on image  27/8 previously 2.2 x 1.7 cm. There is extensive ground-glass with patchy opacities, interstitial thickening reticulations and bronchiectasis/bronchiolectasis predominantly involving the right upper lobe with some involvement of the medial left upper lobe and right lower lobe. Improvement in the bilateral ground-glass and patchy airspace opacity seen on prior examination. Decreased size of a solid left lower lobe pulmonary nodule measuring 7 mm on image 73/8 previously 10 mm. Decreased size of a left upper lobe Peri fissural pulmonary nodule now measuring 4 mm on image 35/8 previously 8 mm. Upper Abdomen: No acute abnormality. No suspicious adrenal nodule/mass. Musculoskeletal: No aggressive lytic or blastic lesion of bone. IMPRESSION: 1. Decreased size of the right upper lobe pulmonary  nodule. 2. Decreased size of the left lower lobe and left upper lobe pulmonary nodules. 3. Decreased mediastinal and pericardiophrenic lymphadenopathy. 4. Extensive ground-glass with patchy opacities, interstitial thickening reticulations and bronchiectasis/bronchiolectasis predominantly involving the right upper lobe with some involvement of the medial left upper lobe and right lower lobe.Findings are favored to reflect evolving postradiation change. 5. Improvement in the bilateral ground-glass and patchy airspace opacity seen on prior examination. 6.  Aortic Atherosclerosis (ICD10-I70.0). Electronically Signed   By: Reyes Holder M.D.   On: 09/30/2023 13:02   US  Abdomen Limited RUQ (LIVER/GB) Result Date: 09/30/2023 CLINICAL DATA:  MASH, other cirrhosis of liver EXAM: ULTRASOUND ABDOMEN LIMITED RIGHT UPPER QUADRANT COMPARISON:  PET-CT May 28, 2023 FINDINGS: Gallbladder: Sludge/stones in the gallbladder fundus. Common bile duct: Diameter: 4 mm Liver: Coarsened hepatic echotexture with increased parenchymal echogenicity. Portal vein is patent on color Doppler imaging with normal direction of blood flow towards the liver. Other: None. IMPRESSION: 1. Coarsened hepatic echotexture with increased parenchymal echogenicity, consistent with given diagnosis of cirrhosis. No suspicious hepatic lesion identified. 2. Sludge/stones in the gallbladder fundus. Electronically Signed   By: Reyes Holder M.D.   On: 09/30/2023 10:46   DG Chest 2 View Result Date: 09/17/2023 CLINICAL DATA:  68 year old female with shortness of breath. Lung cancer, now status post radiation treatment. EXAM: CHEST - 2 VIEW COMPARISON:  CTA chest 06/16/2023. Chest radiographs 09/05/2023 and earlier. FINDINGS: Upright AP and lateral views ear 0335 hours. Right upper lung and right paratracheal mediastinal architectural distortion redemonstrated and stable since last month. Lung volumes and mediastinal contours are stable. Stable trachea. No  pneumothorax, pleural effusion, left lung consolidation, or new pulmonary opacity compared to last month. Calcified aortic atherosclerosis. Negative visible bowel gas. Stable visualized osseous structures. IMPRESSION: Stable abnormal right upper lobe and adjacent right paratracheal mediastinal contour since last month status post radiation for lung cancer. No new cardiopulmonary abnormality identified. Electronically Signed   By: VEAR Hurst M.D.   On: 09/17/2023 05:39    Impression/Plan:  Stage IA3 (cT1c, N0, M0) squamous cell carcinoma, NSCLC, of the RUL; s/p SBRT completed on 06/26/2023  Patient is doing well overall today. Most recent imaging demonstrates a positive response to her treatment. Patient is scheduled to see Dr. Sherrod on 10/03/2023. ***  On date of service, in total, I spent *** minutes on this encounter. Patient was seen in person.  _____________________________________    Leeroy Due, PA-C

## 2023-10-01 ENCOUNTER — Other Ambulatory Visit: Payer: Self-pay | Admitting: Family Medicine

## 2023-10-01 ENCOUNTER — Ambulatory Visit
Admission: RE | Admit: 2023-10-01 | Discharge: 2023-10-01 | Disposition: A | Discharge status: Home / Self Care | Source: Ambulatory Visit | Attending: Radiology | Admitting: Radiology

## 2023-10-01 VITALS — BP 142/80 | HR 64 | Temp 97.5°F | Resp 18 | Ht 65.0 in | Wt 222.1 lb

## 2023-10-01 DIAGNOSIS — R591 Generalized enlarged lymph nodes: Secondary | ICD-10-CM | POA: Insufficient documentation

## 2023-10-01 DIAGNOSIS — Z79899 Other long term (current) drug therapy: Secondary | ICD-10-CM | POA: Insufficient documentation

## 2023-10-01 DIAGNOSIS — I700 Atherosclerosis of aorta: Secondary | ICD-10-CM | POA: Insufficient documentation

## 2023-10-01 DIAGNOSIS — K8020 Calculus of gallbladder without cholecystitis without obstruction: Secondary | ICD-10-CM | POA: Insufficient documentation

## 2023-10-01 DIAGNOSIS — N644 Mastodynia: Secondary | ICD-10-CM

## 2023-10-01 DIAGNOSIS — Z7982 Long term (current) use of aspirin: Secondary | ICD-10-CM | POA: Insufficient documentation

## 2023-10-01 DIAGNOSIS — J479 Bronchiectasis, uncomplicated: Secondary | ICD-10-CM | POA: Diagnosis not present

## 2023-10-01 DIAGNOSIS — Z79890 Hormone replacement therapy (postmenopausal): Secondary | ICD-10-CM | POA: Diagnosis not present

## 2023-10-01 DIAGNOSIS — I3481 Nonrheumatic mitral (valve) annulus calcification: Secondary | ICD-10-CM | POA: Insufficient documentation

## 2023-10-01 DIAGNOSIS — K7460 Unspecified cirrhosis of liver: Secondary | ICD-10-CM | POA: Insufficient documentation

## 2023-10-01 DIAGNOSIS — Z7952 Long term (current) use of systemic steroids: Secondary | ICD-10-CM | POA: Diagnosis not present

## 2023-10-01 DIAGNOSIS — N6310 Unspecified lump in the right breast, unspecified quadrant: Secondary | ICD-10-CM

## 2023-10-01 DIAGNOSIS — R0602 Shortness of breath: Secondary | ICD-10-CM | POA: Diagnosis not present

## 2023-10-01 DIAGNOSIS — C3411 Malignant neoplasm of upper lobe, right bronchus or lung: Secondary | ICD-10-CM | POA: Diagnosis not present

## 2023-10-01 DIAGNOSIS — Z923 Personal history of irradiation: Secondary | ICD-10-CM | POA: Diagnosis not present

## 2023-10-01 DIAGNOSIS — Z87891 Personal history of nicotine dependence: Secondary | ICD-10-CM | POA: Diagnosis not present

## 2023-10-01 DIAGNOSIS — J849 Interstitial pulmonary disease, unspecified: Secondary | ICD-10-CM | POA: Diagnosis not present

## 2023-10-01 DIAGNOSIS — M549 Dorsalgia, unspecified: Secondary | ICD-10-CM | POA: Diagnosis not present

## 2023-10-01 MED ORDER — GABAPENTIN 300 MG PO CAPS: 300.0000 mg | ORAL_CAPSULE | Freq: Three times a day (TID) | ORAL | 1 refills | Status: DC

## 2023-10-01 MED ORDER — GABAPENTIN 300 MG PO CAPS: ORAL_CAPSULE | ORAL | 1 refills | Status: AC

## 2023-10-01 NOTE — Progress Notes (Signed)
 [name] is here today for follow up post radiation to the lung and to receive results from recent CT scans.  Lung Side: Right, she completed radiation treatment was 06/26/2023.  Does the patient complain of any of the following: Pain: Back pain 10 + Shortness of breath w/wo exertion: Without 02, yes. O2 at 2 liters. Cough: Yes, she states that she is spitting up chunks up mucus Hemoptysis: Denies  Pain with swallowing: Denies Swallowing/choking concerns: Denies Appetite: Good Energy Level: Moderate Post radiation skin Changes: Denies   Additional comments if applicable:Complaining of right side pain.  BP (!) 142/80 (BP Location: Left Arm, Patient Position: Sitting)   Pulse 64   Temp (!) 97.5 F (36.4 C) (Temporal)   Resp 18   Ht 5' 5 (1.651 m)   Wt 222 lb 2 oz (100.8 kg)   SpO2 99%   BMI 36.96 kg/m

## 2023-10-02 ENCOUNTER — Other Ambulatory Visit: Payer: Self-pay | Admitting: *Deleted

## 2023-10-02 ENCOUNTER — Encounter: Payer: Self-pay | Admitting: Emergency Medicine

## 2023-10-02 ENCOUNTER — Ambulatory Visit (INDEPENDENT_AMBULATORY_CARE_PROVIDER_SITE_OTHER): Admitting: Emergency Medicine

## 2023-10-02 VITALS — BP 121/72 | HR 69 | Ht 64.0 in | Wt 224.0 lb

## 2023-10-02 DIAGNOSIS — J449 Chronic obstructive pulmonary disease, unspecified: Secondary | ICD-10-CM

## 2023-10-02 DIAGNOSIS — C3411 Malignant neoplasm of upper lobe, right bronchus or lung: Secondary | ICD-10-CM

## 2023-10-02 DIAGNOSIS — D869 Sarcoidosis, unspecified: Secondary | ICD-10-CM | POA: Insufficient documentation

## 2023-10-02 DIAGNOSIS — J9601 Acute respiratory failure with hypoxia: Secondary | ICD-10-CM | POA: Diagnosis not present

## 2023-10-02 MED ORDER — ALBUTEROL SULFATE HFA 108 (90 BASE) MCG/ACT IN AERS: 2.0000 | INHALATION_SPRAY | Freq: Four times a day (QID) | RESPIRATORY_TRACT | 2 refills | Status: AC | PRN

## 2023-10-02 MED ORDER — BREZTRI AEROSPHERE 160-9-4.8 MCG/ACT IN AERO: INHALATION_SPRAY | RESPIRATORY_TRACT | Status: DC

## 2023-10-02 NOTE — Assessment & Plan Note (Signed)
 Will be started on exertional oxygen after her most recent hospitalization.  We will walk today to see if we can qualify her for a POC which will be more portable

## 2023-10-02 NOTE — Assessment & Plan Note (Signed)
 Based on granulomatous disease on prior biopsies.  Plan to continue surveillance imaging, treat obstructive lung disease.

## 2023-10-02 NOTE — Addendum Note (Signed)
 Addended byBETHA FRIES, Casy Tavano A on: 10/02/2023 04:08 PM   Modules accepted: Orders

## 2023-10-02 NOTE — Progress Notes (Signed)
   Subjective:    Patient ID: [name], female    DOB: [birth year], 68 y.o.   MRN: [id number]  COPD Her past medical history is significant for COPD.   ROV 10/02/2023 -- Follow-up visit 68 year old woman with a history of former tobacco, OSA, positive ANA and probable sarcoidosis based on US  navigational bronchoscopy for mediastinal adenopathy makes groundglass pulmonary infiltrates.  This was done in 05/2021.  I repeated her bronchoscopy 05/14/23 for a new right upper lobe nodule and persistent mediastinal adenopathy > this showed squamous cell carcinoma with nodes negative.  She has undergone SBRT.  She was admitted in May, June and then again in August for exacerbations of COPD.  She describes having wheezing, exertional shortness of breath and productive cough.  On the most recent hospitalization she desaturated and was sent home on oxygen at 2 L/min. Some question of a panic component. She likes the Spiriva , feels benefit.   Pulmonary function testing 06/05/2023 reviewed by me showed evidence for mixed obstruction and restriction on her spirometry with a mild curve of her flow-volume loop.  FEV1 68% predicted without a bronchodilator response.  Lung volumes confirm restrictive disease.  Decreased diffusion capacity that corrects to the normal range when adjusted for alveolar volume.   Review of Systems As per HPI     Objective:   Physical Exam Vitals:   10/02/23 1508  BP: 121/72  Pulse: 69  SpO2: 96%     Gen: Pleasant, obese woman, in no distress,  normal affect, frequent cough  ENT: No lesions,  mouth clear,  oropharynx clear, no postnasal drip  Neck: No JVD, no stridor  Lungs: No use of accessory muscles, no crackles or wheezing on normal respiration, no wheeze on forced expiration  Cardiovascular: RRR, heart sounds normal, no murmur or gallops, no peripheral edema  Musculoskeletal: No deformities, no cyanosis or clubbing  Neuro: alert, awake, non focal  Skin: Warm, no  lesions or rash      Assessment & Plan:  COPD (chronic obstructive pulmonary disease) (HCC) Mixed disease on pulmonary function testing.  Suspect a component of right upper lobe scarring and obesity causing restriction.  She does have clear obstruction as well.  She benefited from Spiriva .  She has cough, mucus production and persistent dyspnea.  She has also had frequent exacerbations and hospitalizations.  Would like to change her to Breztri  to add the ICS and LABA components.  Acute hypoxic respiratory failure (HCC) Will be started on exertional oxygen after her most recent hospitalization.  We will walk today to see if we can qualify her for a POC which will be more portable  Cancer of upper lobe of right lung (HCC) She has other pulmonary nodules that have decreased in size compared with priors, reassuring.  She is going to follow with Dr. Sherrod, has also seen Dr. Izell in follow-up.  Repeat CT chest as per their plans, probably in 6 months  Sarcoidosis Based on granulomatous disease on prior biopsies.  Plan to continue surveillance imaging, treat obstructive lung disease.   Time spent 37 minutes  Amanda Chris, MD, PhD 10/02/2023, 3:36 PM [name] [phone] or if no answer [phone]

## 2023-10-02 NOTE — Assessment & Plan Note (Signed)
 Mixed disease on pulmonary function testing.  Suspect a component of right upper lobe scarring and obesity causing restriction.  She does have clear obstruction as well.  She benefited from Spiriva .  She has cough, mucus production and persistent dyspnea.  She has also had frequent exacerbations and hospitalizations.  Would like to change her to Breztri  to add the ICS and LABA components.

## 2023-10-02 NOTE — Patient Instructions (Signed)
 We reviewed your CT scan of the chest today.  There is scarring in the area where you receive radiation.  There are some other pulmonary nodules that have decreased in size compared with your priors.  Good news. Please follow with Dr. Sherrod and Dr. Izell and ensure that you have your repeat CT scan as per their plans. We will perform a walking oximetry today to see if we can qualify you for a portable oxygen concentrator. We will try stopping Spiriva  and starting a new inhaler called Breztri .  Take 2 puffs twice a day.  Rinse and gargle after using.  Keep track of how the medication helps you.  If you get more benefit then we will continue it going forward.  Please message us  to let us  know. Follow-up in our office in 3 months Follow Dr. Shelah in 6 months, sooner if you have any problems.

## 2023-10-02 NOTE — Assessment & Plan Note (Signed)
 She has other pulmonary nodules that have decreased in size compared with priors, reassuring.  She is going to follow with Dr. Sherrod, has also seen Dr. Izell in follow-up.  Repeat CT chest as per their plans, probably in 6 months

## 2023-10-03 ENCOUNTER — Inpatient Hospital Stay: Attending: Internal Medicine

## 2023-10-03 ENCOUNTER — Inpatient Hospital Stay (HOSPITAL_BASED_OUTPATIENT_CLINIC_OR_DEPARTMENT_OTHER): Admitting: Internal Medicine

## 2023-10-03 VITALS — BP 128/79 | HR 68 | Temp 98.4°F | Resp 17 | Wt 221.4 lb

## 2023-10-03 DIAGNOSIS — G4730 Sleep apnea, unspecified: Secondary | ICD-10-CM | POA: Diagnosis not present

## 2023-10-03 DIAGNOSIS — C3411 Malignant neoplasm of upper lobe, right bronchus or lung: Secondary | ICD-10-CM | POA: Insufficient documentation

## 2023-10-03 DIAGNOSIS — J449 Chronic obstructive pulmonary disease, unspecified: Secondary | ICD-10-CM | POA: Diagnosis not present

## 2023-10-03 DIAGNOSIS — E119 Type 2 diabetes mellitus without complications: Secondary | ICD-10-CM

## 2023-10-03 DIAGNOSIS — R634 Abnormal weight loss: Secondary | ICD-10-CM | POA: Diagnosis not present

## 2023-10-03 DIAGNOSIS — E785 Hyperlipidemia, unspecified: Secondary | ICD-10-CM

## 2023-10-03 DIAGNOSIS — Z801 Family history of malignant neoplasm of trachea, bronchus and lung: Secondary | ICD-10-CM | POA: Diagnosis not present

## 2023-10-03 DIAGNOSIS — E039 Hypothyroidism, unspecified: Secondary | ICD-10-CM | POA: Diagnosis not present

## 2023-10-03 DIAGNOSIS — I2510 Atherosclerotic heart disease of native coronary artery without angina pectoris: Secondary | ICD-10-CM

## 2023-10-03 DIAGNOSIS — Z87891 Personal history of nicotine dependence: Secondary | ICD-10-CM | POA: Insufficient documentation

## 2023-10-03 DIAGNOSIS — C3490 Malignant neoplasm of unspecified part of unspecified bronchus or lung: Secondary | ICD-10-CM

## 2023-10-03 LAB — CBC WITH DIFFERENTIAL (CANCER CENTER ONLY)
Abs Immature Granulocytes: 0.03 K/uL (ref 0.00–0.07)
Basophils Absolute: 0 K/uL (ref 0.0–0.1)
Basophils Relative: 0 %
Eosinophils Absolute: 0.5 K/uL (ref 0.0–0.5)
Eosinophils Relative: 8 %
HCT: 45 % (ref 36.0–46.0)
Hemoglobin: 14.9 g/dL (ref 12.0–15.0)
Immature Granulocytes: 1 %
Lymphocytes Relative: 18 %
Lymphs Abs: 1.1 K/uL (ref 0.7–4.0)
MCH: 27.2 pg (ref 26.0–34.0)
MCHC: 33.1 g/dL (ref 30.0–36.0)
MCV: 82.1 fL (ref 80.0–100.0)
Monocytes Absolute: 0.5 K/uL (ref 0.1–1.0)
Monocytes Relative: 8 %
Neutro Abs: 4 K/uL (ref 1.7–7.7)
Neutrophils Relative %: 65 %
Platelet Count: 189 K/uL (ref 150–400)
RBC: 5.48 MIL/uL — ABNORMAL HIGH (ref 3.87–5.11)
RDW: 14.7 % (ref 11.5–15.5)
WBC Count: 6.2 K/uL (ref 4.0–10.5)
nRBC: 0 % (ref 0.0–0.2)

## 2023-10-03 LAB — CMP (CANCER CENTER ONLY)
ALT: 24 U/L (ref 0–44)
AST: 22 U/L (ref 15–41)
Albumin: 3.8 g/dL (ref 3.5–5.0)
Alkaline Phosphatase: 73 U/L (ref 38–126)
Anion gap: 6 (ref 5–15)
BUN: 10 mg/dL (ref 8–23)
CO2: 27 mmol/L (ref 22–32)
Calcium: 9.2 mg/dL (ref 8.9–10.3)
Chloride: 101 mmol/L (ref 98–111)
Creatinine: 0.92 mg/dL (ref 0.44–1.00)
GFR, Estimated: >60 mL/min (ref >60)
Glucose, Bld: 201 mg/dL — ABNORMAL HIGH (ref 70–99)
Potassium: 4.4 mmol/L (ref 3.5–5.1)
Sodium: 134 mmol/L — ABNORMAL LOW (ref 135–145)
Total Bilirubin: 1 mg/dL (ref 0.0–1.2)
Total Protein: 7.4 g/dL (ref 6.5–8.1)

## 2023-10-03 NOTE — Progress Notes (Signed)
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 advised to call immediately if she has any other concerning symptoms in the interval.   The patient voices understanding of current disease status and treatment options and is in agreement with the current care plan.  All questions were answered. The patient knows to call the clinic with any problems, questions or concerns. We can certainly see the patient much sooner if necessary.  Thank you so much for allowing me to participate in the care of [name]. I will continue to follow up the patient with you and assist in her care.  The total time spent in the appointment was 60 minutes including review of chart and various tests results, discussions about plan of care and coordination of care plan .   Disclaimer: This note was dictated  with voice recognition software. Similar sounding words can inadvertently be transcribed and may not be corrected upon review.   Sherrod MARLA Sherrod October 03, 2023, 1:43 PM

## 2023-10-06 DIAGNOSIS — N183 Chronic kidney disease, stage 3 unspecified: Secondary | ICD-10-CM | POA: Diagnosis not present

## 2023-10-06 DIAGNOSIS — F325 Major depressive disorder, single episode, in full remission: Secondary | ICD-10-CM | POA: Diagnosis not present

## 2023-10-06 DIAGNOSIS — I701 Atherosclerosis of renal artery: Secondary | ICD-10-CM | POA: Diagnosis not present

## 2023-10-06 DIAGNOSIS — E1165 Type 2 diabetes mellitus with hyperglycemia: Secondary | ICD-10-CM | POA: Diagnosis not present

## 2023-10-06 DIAGNOSIS — E1121 Type 2 diabetes mellitus with diabetic nephropathy: Secondary | ICD-10-CM | POA: Diagnosis not present

## 2023-10-06 DIAGNOSIS — E1140 Type 2 diabetes mellitus with diabetic neuropathy, unspecified: Secondary | ICD-10-CM | POA: Diagnosis not present

## 2023-10-06 DIAGNOSIS — J439 Emphysema, unspecified: Secondary | ICD-10-CM | POA: Diagnosis not present

## 2023-10-08 ENCOUNTER — Ambulatory Visit
Admission: RE | Admit: 2023-10-08 | Discharge: 2023-10-08 | Disposition: A | Discharge status: Home / Self Care | Source: Ambulatory Visit | Attending: Radiology | Admitting: Radiology

## 2023-10-08 DIAGNOSIS — C3411 Malignant neoplasm of upper lobe, right bronchus or lung: Secondary | ICD-10-CM

## 2023-10-08 NOTE — Progress Notes (Addendum)
 Radiation Oncology         [phone]) [phone] ________________________________  Name: [name] MRN: [id number]  Date: 10/08/2023  DOB: 1955/10/10  Follow-Up Visit Note - Conducted via telephone at patient request.   I spoke with the patient to conduct this consult visit via telephone. The patient was notified in advance and was offered an in person or telemedicine meeting to allow for face to face communication but instead preferred to proceed with a telephone consult.   Outpatient  CC: Verena Mems, MD  Verena Mems, MD  Diagnosis and Prior Radiotherapy:    ICD-10-CM   1. Cancer of upper lobe of right lung [facility])  C34.11        Cancer Staging  Cancer of upper lobe of right lung [facility]) Staging form: Lung, AJCC V9 - Clinical stage from 05/31/2023: Stage IA3 (cT1c, cN0, cM0) - Signed by Izell Domino, MD on 05/31/2023 Stage prefix: Initial diagnosis  Stage IA3 (cT1c, N0, M0) squamous cell carcinoma, NSCLC, of the RUL; s/p SBRT completed on 06/26/2023  ==========DELIVERED PLANS==========  First Treatment Date: 2023-06-17 Last Treatment Date: 2023-06-26   Plan Name: Lung_R_SBRT Site: Lung, Right Technique: SBRT/SRT-IMRT Mode: Photon Dose Per Fraction: 12 Gy Prescribed Dose (Delivered / Prescribed): 60 Gy / 60 Gy Prescribed Fxs (Delivered / Prescribed): 5 / 5  CHIEF COMPLAINT: Here for follow-up and surveillance of NSCLC cancer  Narrative:  The patient returns today for routine follow-up and to review recent imaging.     Since this patient was last seen in our clinic, she met with Dr. Sherrod on 10/03/2023. At that time, he recommended a restaging scan in 6 months with an appointment to follow. Patient expressed agreement in this plan.  Today she states she reports continued right sided chest pain that has not improved since taking the Gabapentin . She is scheduled for a mammogram later this month. She is now using a heating pad which has provided some relief for her.                        ALLERGIES:  is allergic to strawberry extract, cefdinir, roxicodone  [oxycodone  hcl], sulfa antibiotics, topamax  [topiramate ], whole blood, zestril  [lisinopril ], ensure, glucophage  [metformin ], trazodone, cymbalta [duloxetine hcl], hydromet [hydrocodone  bit-homatrop mbr], lunesta [eszopiclone], penicillins, and ultram [tramadol].  Meds: Current Outpatient Medications  Medication Sig Dispense Refill   acetaminophen -codeine  (TYLENOL  #3) 300-30 MG tablet Take 1 tablet by mouth every 6 (six) hours as needed.     albuterol  (VENTOLIN  HFA) 108 (90 Base) MCG/ACT inhaler Inhale 2 puffs into the lungs every 6 (six) hours as needed for wheezing or shortness of breath. 6.7 g 2   amLODipine  (NORVASC ) 10 MG tablet Take 10 mg by mouth daily.     Ascorbic Acid (VITAMIN C PO) Take 1 tablet by mouth daily.     aspirin  EC 81 MG tablet Take 1 tablet (81 mg total) by mouth daily. Swallow whole. 90 tablet 3   atenolol  (TENORMIN ) 100 MG tablet Take 1 tablet (100 mg total) by mouth daily. 90 tablet 3   budesonide -glycopyrrolate -formoterol (BREZTRI  AEROSPHERE) 160-9-4.8 MCG/ACT AERO inhaler 2 samples     Cholecalciferol  (VITAMIN D -3 PO) Take 1 capsule by mouth daily.     cilostazol  (PLETAL ) 100 MG tablet Take 1 tablet (100 mg total) by mouth 2 (two) times daily. 180 tablet 3   clonazePAM  (KLONOPIN ) 0.5 MG tablet Take 0.25-0.5 mg by mouth daily as needed for anxiety.     Cyanocobalamin  (VITAMIN B-12  PO) Take 1 tablet by mouth daily.     dextromethorphan -guaiFENesin  (MUCINEX  DM) 30-600 MG 12hr tablet Take 1 tablet by mouth 2 (two) times daily as needed for cough. 15 tablet 0   Diclofenac  Sodium 3 % GEL Apply 1 Application topically 2 (two) times daily as needed (back pain).     dicyclomine  (BENTYL ) 10 MG capsule Take 10 mg by mouth 3 (three) times daily before meals.     doxycycline  (VIBRA -TABS) 100 MG tablet Take 1 tablet (100 mg total) by mouth 2 (two) times daily. 6 tablet 0   estradiol  (ESTRACE ) 0.1  MG/GM vaginal cream Apply 3 times weekly by using a pea-sized amount applied to fingertip 42.5 g 12   Evolocumab  (REPATHA  SURECLICK) 140 MG/ML SOAJ INJECT 1 PEN INTO THE SKIN EVERY 14 (FOURTEEN) DAYS. 6 mL 3   gabapentin  (NEURONTIN ) 300 MG capsule Start: 300 mg PO qd x1 day, then 300 mg PO bid x1 day, then 300 mg PO tid Info: taper dose over >7 days to D/C 30 capsule 1   hydrOXYzine  (ATARAX ) 25 MG tablet Take 1 tablet (25 mg total) by mouth 3 (three) times daily as needed for anxiety. 90 tablet 0   insulin  aspart protamine  - aspart (NOVOLOG  70/30 MIX) (70-30) 100 UNIT/ML FlexPen Inject 40-60 Units into the skin See admin instructions. Inject 60 units into the skin before breakfast and inject 40 units before dinner.     ipratropium-albuterol  (DUONEB) 0.5-2.5 (3) MG/3ML SOLN Take 3 mLs by nebulization every 8 (eight) hours as needed (for shortness of breathing or wheezing).     levothyroxine  (SYNTHROID ) 100 MCG tablet Take 100 mcg by mouth daily before breakfast.     predniSONE  (DELTASONE ) 20 MG tablet Take 2 tablets (40 mg total) by mouth daily with breakfast. 8 tablet 0   rosuvastatin  (CRESTOR ) 40 MG tablet Take 1 tablet (40 mg total) by mouth daily. 90 tablet 3   sertraline  (ZOLOFT ) 50 MG tablet Take 50 mg by mouth daily.     spironolactone  (ALDACTONE ) 25 MG tablet Take 1 tablet (25 mg total) by mouth daily.     tirzepatide (MOUNJARO) 2.5 MG/0.5ML Pen Inject 2.5 mg into the skin every Monday.     No current facility-administered medications for this encounter.    Physical Findings: The patient is in no acute distress. Patient is alert and oriented.  vitals were not taken for this visit.  Deferred, due to nature of telephone visit.     Lab Findings: Lab Results  Component Value Date   WBC 6.2 10/03/2023   HGB 14.9 10/03/2023   HCT 45.0 10/03/2023   MCV 82.1 10/03/2023   PLT 189 10/03/2023    Radiographic Findings: CT Chest Wo Contrast Result Date: 09/30/2023 CLINICAL DATA:  Lung  cancer, follow-up.  * Tracking Code: BO * EXAM: CT CHEST WITHOUT CONTRAST TECHNIQUE: Multidetector CT imaging of the chest was performed following the standard protocol without IV contrast. RADIATION DOSE REDUCTION: This exam was performed according to the departmental dose-optimization program which includes automated exposure control, adjustment of the mA and/or kV according to patient size and/or use of iterative reconstruction technique. COMPARISON:  Multiple priors including CT Jun 16, 2023 and PET-CT May 28, 2023 FINDINGS: Cardiovascular: Aortic atherosclerosis. Ectasia of the ascending thoracic aorta measuring 4.2 cm, unchanged. Enlarged main pulmonary artery. Three-vessel coronary artery calcifications. Calcifications of the mitral annulus. Normal size heart. No significant pericardial effusion/thickening. Mediastinum/Nodes: No suspicious thyroid  nodule. Decreased mediastinal and pericardiophrenic lymphadenopathy. For reference: -low right paratracheal  lymph node measures 9 mm in short axis on image 48/2 previously 14 mm -right pericardiophrenic lymph node measures 7 mm in short axis on image 99/2 previously 9 mm. Hilar lymph nodes not well evaluated on this noncontrast enhanced examination. Soft tissue thickening along the right para and suprahilar mediastinum. Lungs/Pleura: Right upper lobe soft tissue nodule measures 13 x 10 mm on image 27/8 previously 2.2 x 1.7 cm. There is extensive ground-glass with patchy opacities, interstitial thickening reticulations and bronchiectasis/bronchiolectasis predominantly involving the right upper lobe with some involvement of the medial left upper lobe and right lower lobe. Improvement in the bilateral ground-glass and patchy airspace opacity seen on prior examination. Decreased size of a solid left lower lobe pulmonary nodule measuring 7 mm on image 73/8 previously 10 mm. Decreased size of a left upper lobe Peri fissural pulmonary nodule now measuring 4 mm on image  35/8 previously 8 mm. Upper Abdomen: No acute abnormality. No suspicious adrenal nodule/mass. Musculoskeletal: No aggressive lytic or blastic lesion of bone. IMPRESSION: 1. Decreased size of the right upper lobe pulmonary nodule. 2. Decreased size of the left lower lobe and left upper lobe pulmonary nodules. 3. Decreased mediastinal and pericardiophrenic lymphadenopathy. 4. Extensive ground-glass with patchy opacities, interstitial thickening reticulations and bronchiectasis/bronchiolectasis predominantly involving the right upper lobe with some involvement of the medial left upper lobe and right lower lobe.Findings are favored to reflect evolving postradiation change. 5. Improvement in the bilateral ground-glass and patchy airspace opacity seen on prior examination. 6.  Aortic Atherosclerosis (ICD10-I70.0). Electronically Signed   By: Reyes Holder M.D.   On: 09/30/2023 13:02   US  Abdomen Limited RUQ (LIVER/GB) Result Date: 09/30/2023 CLINICAL DATA:  MASH, other cirrhosis of liver EXAM: ULTRASOUND ABDOMEN LIMITED RIGHT UPPER QUADRANT COMPARISON:  PET-CT May 28, 2023 FINDINGS: Gallbladder: Sludge/stones in the gallbladder fundus. Common bile duct: Diameter: 4 mm Liver: Coarsened hepatic echotexture with increased parenchymal echogenicity. Portal vein is patent on color Doppler imaging with normal direction of blood flow towards the liver. Other: None. IMPRESSION: 1. Coarsened hepatic echotexture with increased parenchymal echogenicity, consistent with given diagnosis of cirrhosis. No suspicious hepatic lesion identified. 2. Sludge/stones in the gallbladder fundus. Electronically Signed   By: Reyes Holder M.D.   On: 09/30/2023 10:46   DG Chest 2 View Result Date: 09/17/2023 CLINICAL DATA:  68 year old female with shortness of breath. Lung cancer, now status post radiation treatment. EXAM: CHEST - 2 VIEW COMPARISON:  CTA chest 06/16/2023. Chest radiographs 09/05/2023 and earlier. FINDINGS: Upright AP and  lateral views ear 0335 hours. Right upper lung and right paratracheal mediastinal architectural distortion redemonstrated and stable since last month. Lung volumes and mediastinal contours are stable. Stable trachea. No pneumothorax, pleural effusion, left lung consolidation, or new pulmonary opacity compared to last month. Calcified aortic atherosclerosis. Negative visible bowel gas. Stable visualized osseous structures. IMPRESSION: Stable abnormal right upper lobe and adjacent right paratracheal mediastinal contour since last month status post radiation for lung cancer. No new cardiopulmonary abnormality identified. Electronically Signed   By: VEAR Hurst M.D.   On: 09/17/2023 05:39    Impression/Plan:    Stage IA3 (cT1c, N0, M0) squamous cell carcinoma, NSCLC, of the RUL; s/p SBRT completed on 06/26/2023  Patient met with Dr. Sherrod on 10/03/2023. He recommended a re-staging scan and follow-up in 6 months. Radiation follow-up PRN. We appreciate the opportunity to take part in this patient's care.   Radiation-induced neuropathic pain  Patient presenting with right-sided back/chest that could be secondary to radiation treatment.  Patient states Gabapentin  has not improved her pain. Gabapentin  can take up to four weeks to feel significant relief. Recommend continued use to achieve maximum benefits. Patient should call back if she does not experience relief in pain.    This encounter was conducted via telephone.  The patient has provided two factor identification and has given verbal consent for this type of encounter and has been advised to only accept a meeting of this type in a secure network environment.  The time spent during this encounter was 20 minutes including preparation, discussion, and coordination of the patient's care  The attendants for this meeting include Leeroy Due PA-C and  patient During the encounter Leeroy Due PA-C was located at [facility] Radiation Oncology  Department.  Patient was located at home. _____________________________________    Leeroy Due, PA-C

## 2023-10-09 IMAGING — DX DG CHEST 1V PORT
1 series · 1 of 1 positions shown · non-contrast
Comparison: Radiographs 02/14/2019 and 12/21/2013. Chest CT
04/05/2021.

CLINICAL DATA: Post bronchoscopy with biopsies.

EXAM:
PORTABLE CHEST 1 VIEW

[chest]
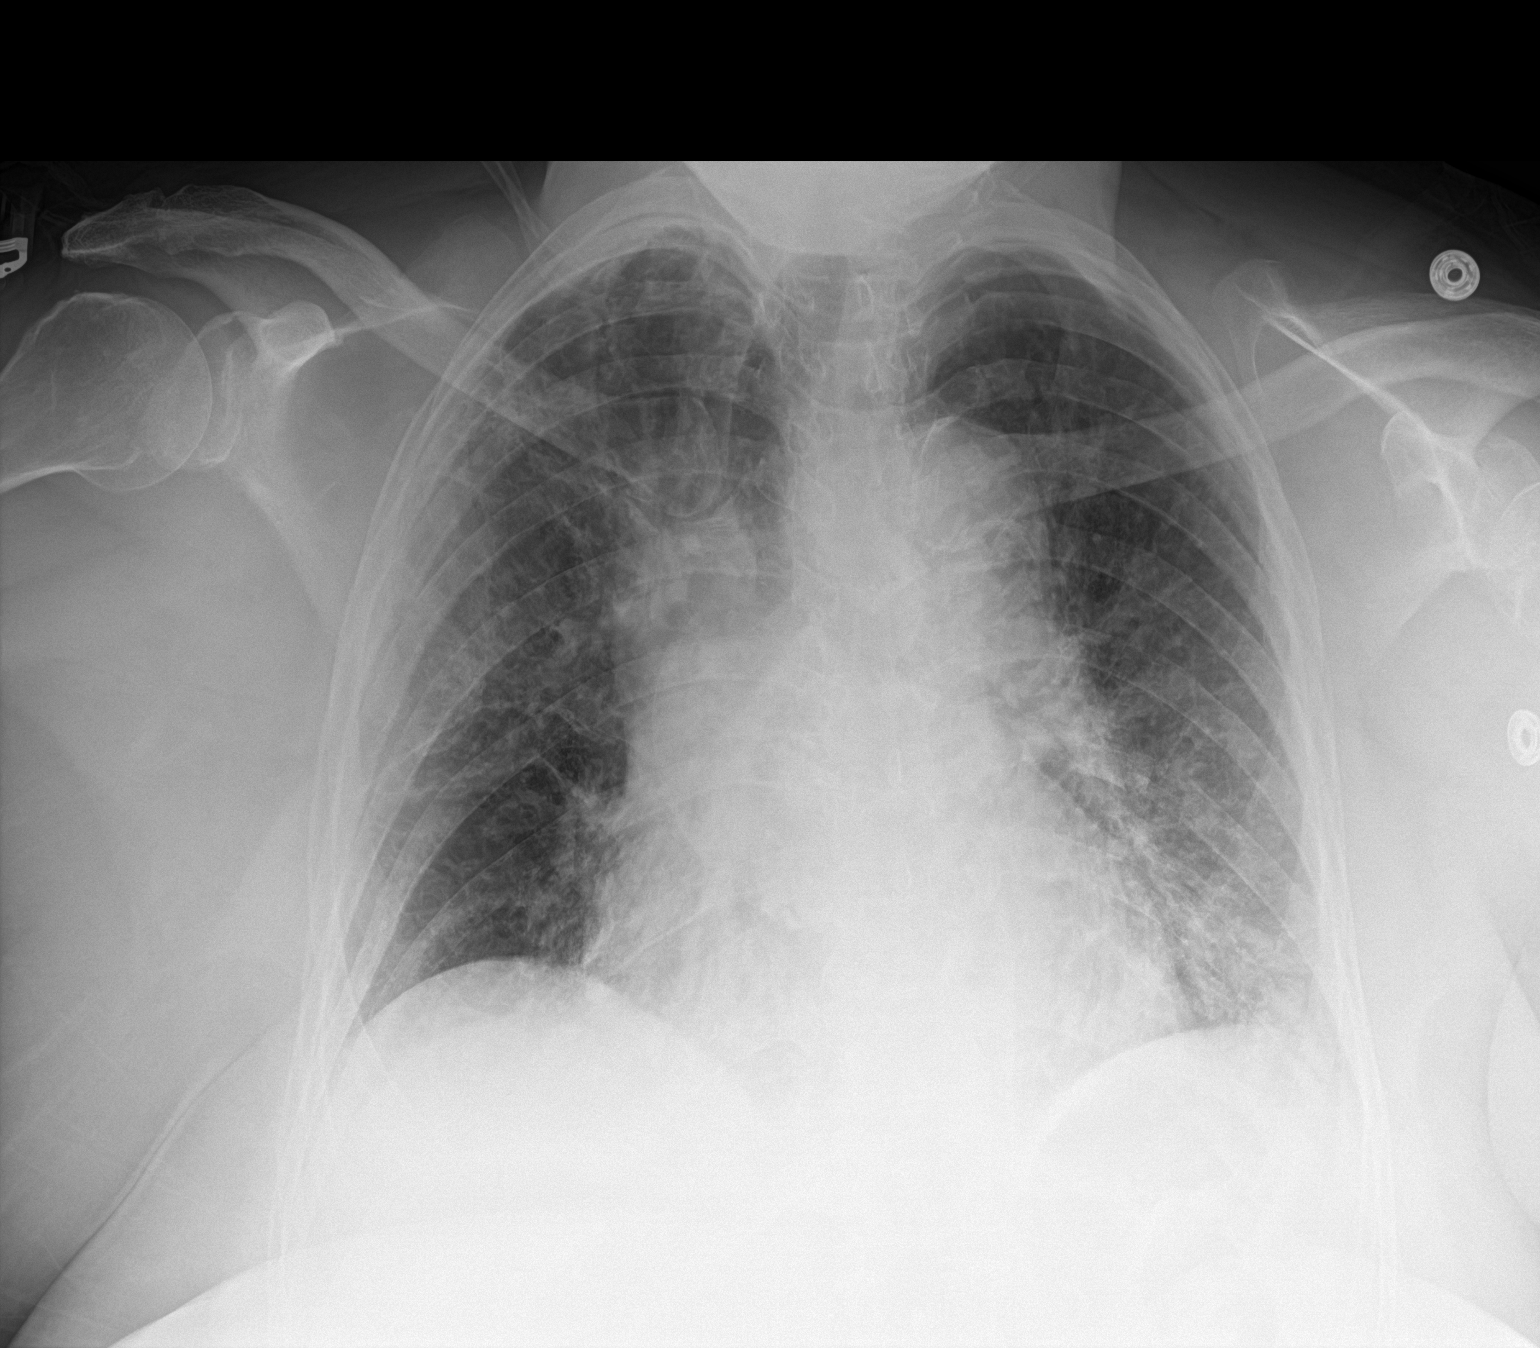

[1 of 1 positions shown; findings below may reference images not displayed]

FINDINGS: 3222 hours. Stable cardiomegaly and aortic atherosclerosis. There is
stable chronic lung disease with diffuse central airway thickening,
subpleural reticulation and scattered nodular densities. No
pneumothorax, significant pleural effusion or confluent airspace
opacity is identified. The bones appear unchanged.
IMPRESSION: No evidence of pneumothorax or other complication following
bronchoscopy. Grossly unchanged chronic lung disease and aortic
atherosclerosis.

## 2023-10-10 DIAGNOSIS — E538 Deficiency of other specified B group vitamins: Secondary | ICD-10-CM | POA: Diagnosis not present

## 2023-10-10 DIAGNOSIS — E1165 Type 2 diabetes mellitus with hyperglycemia: Secondary | ICD-10-CM | POA: Diagnosis not present

## 2023-10-10 DIAGNOSIS — E559 Vitamin D deficiency, unspecified: Secondary | ICD-10-CM | POA: Diagnosis not present

## 2023-10-10 DIAGNOSIS — E7800 Pure hypercholesterolemia, unspecified: Secondary | ICD-10-CM | POA: Diagnosis not present

## 2023-10-10 DIAGNOSIS — Z0000 Encounter for general adult medical examination without abnormal findings: Secondary | ICD-10-CM | POA: Diagnosis not present

## 2023-10-10 DIAGNOSIS — Z1331 Encounter for screening for depression: Secondary | ICD-10-CM | POA: Diagnosis not present

## 2023-10-10 DIAGNOSIS — Z23 Encounter for immunization: Secondary | ICD-10-CM | POA: Diagnosis not present

## 2023-10-10 DIAGNOSIS — E039 Hypothyroidism, unspecified: Secondary | ICD-10-CM | POA: Diagnosis not present

## 2023-10-10 DIAGNOSIS — I2510 Atherosclerotic heart disease of native coronary artery without angina pectoris: Secondary | ICD-10-CM | POA: Diagnosis not present

## 2023-10-10 DIAGNOSIS — C3411 Malignant neoplasm of upper lobe, right bronchus or lung: Secondary | ICD-10-CM | POA: Diagnosis not present

## 2023-10-14 DIAGNOSIS — Z1211 Encounter for screening for malignant neoplasm of colon: Secondary | ICD-10-CM | POA: Diagnosis not present

## 2023-10-15 ENCOUNTER — Ambulatory Visit
Admission: RE | Admit: 2023-10-15 | Discharge: 2023-10-15 | Disposition: A | Discharge status: Home / Self Care | Source: Ambulatory Visit | Attending: Family Medicine | Admitting: Family Medicine

## 2023-10-15 ENCOUNTER — Other Ambulatory Visit

## 2023-10-15 DIAGNOSIS — N6310 Unspecified lump in the right breast, unspecified quadrant: Secondary | ICD-10-CM

## 2023-10-15 DIAGNOSIS — N644 Mastodynia: Secondary | ICD-10-CM

## 2023-11-05 ENCOUNTER — Ambulatory Visit: Attending: Cardiovascular Disease | Admitting: Cardiovascular Disease

## 2023-11-05 VITALS — BP 110/60 | HR 70 | Ht 65.0 in | Wt 224.0 lb

## 2023-11-05 DIAGNOSIS — E785 Hyperlipidemia, unspecified: Secondary | ICD-10-CM

## 2023-11-05 DIAGNOSIS — I2510 Atherosclerotic heart disease of native coronary artery without angina pectoris: Secondary | ICD-10-CM

## 2023-11-05 DIAGNOSIS — I10 Essential (primary) hypertension: Secondary | ICD-10-CM

## 2023-11-05 DIAGNOSIS — I739 Peripheral vascular disease, unspecified: Secondary | ICD-10-CM

## 2023-11-05 DIAGNOSIS — I779 Disorder of arteries and arterioles, unspecified: Secondary | ICD-10-CM

## 2023-11-05 DIAGNOSIS — I701 Atherosclerosis of renal artery: Secondary | ICD-10-CM

## 2023-11-05 NOTE — Patient Instructions (Signed)
 Medication Instructions:  No changes *If you need a refill on your cardiac medications before your next appointment, please call your pharmacy*  Lab Work: None ordered If you have labs (blood work) drawn today and your tests are completely normal, you will receive your results only by: MyChart Message (if you have MyChart) OR A paper copy in the mail If you have any lab test that is abnormal or we need to change your treatment, we will call you to review the results.  Testing/Procedures: None ordered  Follow-Up: At [facility], you and your health needs are our priority.  As part of our continuing mission to provide you with exceptional heart care, our providers are all part of one team.  This team includes your primary Cardiologist (physician) and Advanced Practice Providers or APPs (Physician Assistants and Nurse Practitioners) who all work together to provide you with the care you need, when you need it.  Your next appointment:   12 month(s)  Provider:   Dr. Alvenia Aus  We recommend signing up for the patient portal called "MyChart".  Sign up information is provided on this After Visit Summary.  MyChart is used to connect with patients for Virtual Visits (Telemedicine).  Patients are able to view lab/test results, encounter notes, upcoming appointments, etc.  Non-urgent messages can be sent to your provider as well.   To learn more about what you can do with MyChart, go to [email or web address].
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
## 2023-11-22 ENCOUNTER — Other Ambulatory Visit: Payer: Self-pay

## 2023-11-22 ENCOUNTER — Emergency Department (HOSPITAL_COMMUNITY)
Admission: EM | Admit: 2023-11-22 | Discharge: 2023-11-22 | Discharge status: Against Medical Advice | Attending: Emergency Medicine | Admitting: Emergency Medicine

## 2023-11-22 DIAGNOSIS — Z5321 Procedure and treatment not carried out due to patient leaving prior to being seen by health care provider: Secondary | ICD-10-CM | POA: Diagnosis not present

## 2023-11-22 DIAGNOSIS — N644 Mastodynia: Secondary | ICD-10-CM | POA: Insufficient documentation

## 2023-11-22 NOTE — ED Notes (Signed)
 Pt ambulated to EMT first desk and advised that their son was picking them up and they were leaving. Pt handed in labels and left ED. Triage RN made aware.

## 2023-11-22 NOTE — ED Triage Notes (Signed)
 Pt reports right breast pain going to back. Been like this since chemo in May, now is more constant. Used pain cream this morning on back.

## 2023-11-27 ENCOUNTER — Ambulatory Visit: Admitting: Urology

## 2023-12-06 ENCOUNTER — Other Ambulatory Visit: Payer: Self-pay | Admitting: Cardiovascular Disease

## 2023-12-06 MED ORDER — ATENOLOL 100 MG PO TABS: 100.0000 mg | ORAL_TABLET | Freq: Every day | ORAL | 3 refills | Status: DC

## 2023-12-16 ENCOUNTER — Other Ambulatory Visit: Payer: Self-pay | Admitting: Urology

## 2023-12-17 DIAGNOSIS — Z23 Encounter for immunization: Secondary | ICD-10-CM | POA: Diagnosis not present

## 2023-12-17 DIAGNOSIS — R079 Chest pain, unspecified: Secondary | ICD-10-CM | POA: Diagnosis not present

## 2023-12-17 DIAGNOSIS — C3411 Malignant neoplasm of upper lobe, right bronchus or lung: Secondary | ICD-10-CM | POA: Diagnosis not present

## 2023-12-17 DIAGNOSIS — E039 Hypothyroidism, unspecified: Secondary | ICD-10-CM | POA: Diagnosis not present

## 2023-12-17 DIAGNOSIS — I2510 Atherosclerotic heart disease of native coronary artery without angina pectoris: Secondary | ICD-10-CM | POA: Diagnosis not present

## 2023-12-17 DIAGNOSIS — E7800 Pure hypercholesterolemia, unspecified: Secondary | ICD-10-CM | POA: Diagnosis not present

## 2023-12-17 DIAGNOSIS — M79621 Pain in right upper arm: Secondary | ICD-10-CM | POA: Diagnosis not present

## 2023-12-17 DIAGNOSIS — E1165 Type 2 diabetes mellitus with hyperglycemia: Secondary | ICD-10-CM | POA: Diagnosis not present

## 2023-12-17 DIAGNOSIS — R42 Dizziness and giddiness: Secondary | ICD-10-CM | POA: Diagnosis not present

## 2023-12-19 ENCOUNTER — Ambulatory Visit: Admitting: Urology

## 2023-12-19 DIAGNOSIS — E1121 Type 2 diabetes mellitus with diabetic nephropathy: Secondary | ICD-10-CM | POA: Diagnosis not present

## 2023-12-19 DIAGNOSIS — E1140 Type 2 diabetes mellitus with diabetic neuropathy, unspecified: Secondary | ICD-10-CM | POA: Diagnosis not present

## 2023-12-19 DIAGNOSIS — I701 Atherosclerosis of renal artery: Secondary | ICD-10-CM | POA: Diagnosis not present

## 2023-12-19 DIAGNOSIS — J439 Emphysema, unspecified: Secondary | ICD-10-CM | POA: Diagnosis not present

## 2023-12-22 DIAGNOSIS — D696 Thrombocytopenia, unspecified: Secondary | ICD-10-CM | POA: Diagnosis not present

## 2023-12-26 ENCOUNTER — Ambulatory Visit: Admitting: Urology

## 2023-12-26 ENCOUNTER — Encounter: Payer: Self-pay | Admitting: Urology

## 2023-12-26 VITALS — BP 133/74 | HR 70 | Ht 65.0 in | Wt 219.0 lb

## 2023-12-26 DIAGNOSIS — N200 Calculus of kidney: Secondary | ICD-10-CM | POA: Diagnosis not present

## 2023-12-26 DIAGNOSIS — N390 Urinary tract infection, site not specified: Secondary | ICD-10-CM | POA: Insufficient documentation

## 2023-12-26 DIAGNOSIS — R8290 Unspecified abnormal findings in urine: Secondary | ICD-10-CM | POA: Diagnosis not present

## 2023-12-26 DIAGNOSIS — Z87440 Personal history of urinary (tract) infections: Secondary | ICD-10-CM

## 2023-12-26 LAB — URINALYSIS, ROUTINE W REFLEX MICROSCOPIC
Bilirubin, UA: NEGATIVE
Glucose, UA: NEGATIVE
Ketones, UA: NEGATIVE
Nitrite, UA: NEGATIVE
Protein,UA: NEGATIVE
Specific Gravity, UA: 1.015 (ref 1.005–1.030)
Urobilinogen, Ur: 2 mg/dL — ABNORMAL HIGH (ref 0.2–1.0)
pH, UA: 7 (ref 5.0–7.5)

## 2023-12-26 LAB — MICROSCOPIC EXAMINATION
Cast Type: NEGATIVE
Casts: NEGATIVE /LPF
Crystal Type: NEGATIVE
Crystals: NEGATIVE
Mucus, UA: NEGATIVE
Renal Epithel, UA: NEGATIVE /HPF
WBC, UA: >30 /HPF — AB (ref 0–5)
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
## 2023-12-31 ENCOUNTER — Ambulatory Visit: Payer: Self-pay | Admitting: Urology

## 2023-12-31 DIAGNOSIS — R8290 Unspecified abnormal findings in urine: Secondary | ICD-10-CM

## 2023-12-31 LAB — URINE CULTURE

## 2023-12-31 MED ORDER — NITROFURANTOIN MONOHYD MACRO 100 MG PO CAPS: 100.0000 mg | ORAL_CAPSULE | Freq: Two times a day (BID) | ORAL | 0 refills | Status: AC

## 2024-01-01 DIAGNOSIS — J849 Interstitial pulmonary disease, unspecified: Secondary | ICD-10-CM | POA: Diagnosis not present

## 2024-01-05 DIAGNOSIS — F325 Major depressive disorder, single episode, in full remission: Secondary | ICD-10-CM | POA: Diagnosis not present

## 2024-01-05 DIAGNOSIS — E1121 Type 2 diabetes mellitus with diabetic nephropathy: Secondary | ICD-10-CM | POA: Diagnosis not present

## 2024-01-05 DIAGNOSIS — E1165 Type 2 diabetes mellitus with hyperglycemia: Secondary | ICD-10-CM | POA: Diagnosis not present

## 2024-01-05 DIAGNOSIS — N183 Chronic kidney disease, stage 3 unspecified: Secondary | ICD-10-CM | POA: Diagnosis not present

## 2024-01-06 ENCOUNTER — Encounter: Payer: Self-pay | Admitting: Primary Care

## 2024-01-06 ENCOUNTER — Ambulatory Visit: Admitting: Primary Care

## 2024-01-06 VITALS — BP 128/62 | HR 62 | Temp 98.3°F | Ht 65.0 in | Wt 222.8 lb

## 2024-01-06 DIAGNOSIS — R918 Other nonspecific abnormal finding of lung field: Secondary | ICD-10-CM

## 2024-01-06 DIAGNOSIS — J4489 Other specified chronic obstructive pulmonary disease: Secondary | ICD-10-CM

## 2024-01-06 DIAGNOSIS — J8410 Pulmonary fibrosis, unspecified: Secondary | ICD-10-CM | POA: Diagnosis not present

## 2024-01-06 DIAGNOSIS — J479 Bronchiectasis, uncomplicated: Secondary | ICD-10-CM

## 2024-01-06 DIAGNOSIS — J449 Chronic obstructive pulmonary disease, unspecified: Secondary | ICD-10-CM

## 2024-01-06 DIAGNOSIS — Z85118 Personal history of other malignant neoplasm of bronchus and lung: Secondary | ICD-10-CM | POA: Diagnosis not present

## 2024-01-06 DIAGNOSIS — Z9981 Dependence on supplemental oxygen: Secondary | ICD-10-CM | POA: Diagnosis not present

## 2024-01-06 DIAGNOSIS — J9610 Chronic respiratory failure, unspecified whether with hypoxia or hypercapnia: Secondary | ICD-10-CM | POA: Diagnosis not present

## 2024-01-06 DIAGNOSIS — G4733 Obstructive sleep apnea (adult) (pediatric): Secondary | ICD-10-CM

## 2024-01-06 DIAGNOSIS — J849 Interstitial pulmonary disease, unspecified: Secondary | ICD-10-CM

## 2024-01-06 MED ORDER — STIOLTO RESPIMAT 2.5-2.5 MCG/ACT IN AERS: 2.0000 | INHALATION_SPRAY | Freq: Every day | RESPIRATORY_TRACT | Status: AC

## 2024-01-06 NOTE — Patient Instructions (Signed)
  VISIT SUMMARY: [name], a 68 year old female with COPD and respiratory failure, visited for a follow-up on her pulmonary conditions. She experiences persistent shortness of breath, chronic cough with mucus production, and requires oxygen  with exertion. She has a history of right upper lobe lung cancer treated with radiation, resulting in scarring and pain. Her last CT scan showed a decrease in the size of her pulmonary nodules.  YOUR PLAN: -CHRONIC OBSTRUCTIVE PULMONARY DISEASE (COPD) WITH CHRONIC COUGH AND MUCUS: COPD is a chronic lung disease that causes obstructed airflow from the lungs, leading to breathing difficulties. We will switch you to Stiolto (which is similar to Spiriva ) better symptom control. It is recommended to use Mucinex  once or twice daily to thin mucus and use the flutter valve two to three times daily, preferably after nebulizer treatment. If mucus burden persists, a therapy percussion vest may be considered. Brinsupri will be considered for recurrent exacerbations if needed.  -BRONCHIECTASIS WITH RECURRENT EXACERBATIONS AND MUCUS PRODUCTION: Bronchiectasis is a condition where the bronchial tubes of your lungs are permanently damaged, widened, and thickened, leading to mucus build-up and recurrent infections. You should use the flutter valve two to three times daily and consider a therapy percussion vest if mucus burden persists. The potential use of Brinsupri for recurrent exacerbations will be discussed with Dr. Shelah.  -PULMONARY FIBROSIS AND SCARRING SECONDARY TO RADIATION THERAPY: Pulmonary fibrosis is a lung disease that occurs when lung tissue becomes damaged and scarred, often due to radiation therapy. We will continue monitoring your condition with an upcoming CT scan in February.  -HISTORY OF RIGHT UPPER LOBE LUNG CANCER, POST-RADIATION: You have a history of right upper lobe lung cancer treated with radiation therapy, which has resulted in breast and arm pain. We  will continue monitoring your condition with an upcoming CT scan in February.  -CHRONIC RESPIRATORY FAILURE REQUIRING SUPPLEMENTAL OXYGEN : Chronic respiratory failure is a condition where the lungs cannot provide enough oxygen  to the body's tissues or remove enough carbon dioxide from the body. You should continue using supplemental oxygen  with exertion and monitor your oxygen  levels. Use oxygen  if O2 <88%.   INSTRUCTIONS: Please follow up with a CT scan in February to monitor your pulmonary nodules and scarring. Continue using supplemental oxygen  with exertion and monitor your oxygen  levels three to four times daily. Use the flutter valve two to three times daily, preferably after nebulizer treatment, and consider using Mucinex  once or twice daily to thin mucus. If symptoms persist, we may consider additional treatments such as a therapy percussion vest or Brinsupri for recurrent exacerbations.  Follow-up: 6 week televisit with Landry NP

## 2024-01-06 NOTE — Progress Notes (Signed)
 @Patient  ID: [name], female    DOB: 1955/10/21, 68 y.o.   MRN: [id number]  Chief Complaint  Patient presents with   Interstitial Lung Disease   COPD    SOB, uses 2L O2 CONT AND HS     Referring provider: Verena Mems, MD  HPI: 68 year old female, PMH significant for CHF, CAD, HTN, TIA, lung cancer, chronic respiratory failure, COPD, ILD/bronchiectasis, sarcoidosis, hypothyroidism, type 2 diabetes, CKD, hyperlipidemia.   01/06/2024 Discussed the use of AI scribe software for clinical note transcription with the patient, who gave verbal consent to proceed.  History of Present Illness [name] is a 68 year old female with COPD and respiratory failure who presents for a follow-up visit. [name] was referred by Dr. Shelah for follow-up of [name] pulmonary conditions.  [name] has chronic obstructive pulmonary disease (COPD) and respiratory failure, requiring oxygen  with exertion. [name] experiences persistent shortness of breath and a chronic cough with mucus production. Previously on Spiriva , [name] was switched to Breztri  in August but prefers Spiriva  for better symptom control. [name] also uses albuterol  and a nebulizer for respiratory issues.  [name] has a chronic cough that worsens at night, producing thick, clear-brown mucus. [name] uses Robitussin to manage symptoms but finds Mucinex  too expensive. [name] checks [name] oxygen  levels three to four times a day, noting a drop during physical activities such as cleaning. [name] oxygen  level was 89% today on room air with exertion and recovered to 94% with rest.   [name] has a history of right upper lobe lung cancer and underwent radiation therapy earlier this year, resulting in scarring and pain in [name] breast and right arm. The pain, described as a 'shocking feeling', began after radiation treatment. [name] last CT scan in August showed a decrease in the size of [name] pulmonary nodules.  Follows up with Dr. Sherrod for [name] lung cancer. [name] uses a flutter valve to help  with mucus clearance but has not been using it regularly. [name] experiences pain in [name] breast and arm that began after radiation therapy.   Allergies  Allergen Reactions   Strawberry Extract Anaphylaxis   Cefdinir Other (See Comments)    Unknown reaction   Roxicodone  [Oxycodone  Hcl] Nausea And Vomiting   Sulfa Antibiotics Itching and Swelling    Angioedema    Topamax  [Topiramate ] Nausea And Vomiting and Other (See Comments)    Dizziness Blurred Vision   Whole Blood Other (See Comments)    Jehova's witness   Zestril  [Lisinopril ] Itching and Swelling    Angioedema    Ensure Nausea Only and Other (See Comments)    Stomach pain   Glucophage  [Metformin ] Other (See Comments)    Caused kidney damage   Trazodone Other (See Comments)    Tremors    Cymbalta [Duloxetine Hcl] Other (See Comments)    Dizziness   Hydromet [Hydrocodone  Bit-Homatrop Mbr] Nausea And Vomiting   Lunesta [Eszopiclone] Other (See Comments)    Bad taste in mouth    Penicillins Rash and Other (See Comments)    Paralyzed (per patient)    Ultram [Tramadol] Other (See Comments)    Hallucinations    Immunization History  Administered Date(s) Administered   Fluzone Influenza virus vaccine,trivalent (IIV3), split virus 01/04/2011, 11/05/2011, 12/10/2012   Influenza,inj,Quad PF,6+ Mos 11/06/2018, 11/21/2018, 11/27/2019   Influenza,inj,quad, With Preservative 11/05/2016   PFIZER(Purple Top)SARS-COV-2 Vaccination 04/05/2019, 04/26/2019, 11/13/2019   PNEUMOCOCCAL CONJUGATE-20 09/01/2021   Pfizer Covid-19 Vaccine Bivalent Booster 67yrs & up 12/09/2023   Pneumococcal Polysaccharide-23  10/26/2010   Tdap 12/23/2009, 07/15/2019   Zoster, Live 07/29/2020, 09/30/2020    Past Medical History:  Diagnosis Date   Acid reflux    Anxiety    ARF (acute renal failure) 12/14/2011   Arthritis    Asthma    CAD (coronary artery disease)    a. Cath 2015 - moderate LAD, diagonal, and OM disease with 100% dRCA with L-R  collaterals. b. 2019 nuc normal.   Carotid artery occlusion    Chronic pain following surgery or procedure 2008   Abdominal pain   Common migraine with intractable migraine 06/04/2019   Dehydration 02/09/2012   Diabetes mellitus    Type II   Diabetic hyperosmolar non-ketotic state (HCC) 04/09/2014   Dyspnea    Headache(784.0)    Hepatic steatosis    noted on CT 04/2019   History of kidney stones    Hyperlipidemia    Hypertension    stress test- scheduled for 12/30/2012   Hypothyroidism    Lung cancer (HCC)    PAD (peripheral artery disease)    a. presumed by noninvasive testing 2021.   Pneumonia    Prolonged QT interval    Pulmonary nodules/lesions, multiple 06/05/2019   SBO (small bowel obstruction) (HCC) 02/08/2012   Sleep apnea    Uses a Cpap   Stroke (HCC) 11/2012   tingling in L arm , slurred speech- came to ER    Tobacco History: Social History   Tobacco Use  Smoking Status Former   Current packs/day: 0.00   Average packs/day: 0.5 packs/day for 29.0 years (14.5 ttl pk-yrs)   Types: Cigarettes   Start date: 02/06/1972   Quit date: 02/05/2001   Years since quitting: 22.9  Smokeless Tobacco Never   Counseling given: Not Answered   Outpatient Medications Prior to Visit  Medication Sig Dispense Refill   acetaminophen -codeine  (TYLENOL  #3) 300-30 MG tablet Take 1 tablet by mouth every 6 (six) hours as needed.     albuterol  (VENTOLIN  HFA) 108 (90 Base) MCG/ACT inhaler Inhale 2 puffs into the lungs every 6 (six) hours as needed for wheezing or shortness of breath. 6.7 g 2   amLODipine  (NORVASC ) 10 MG tablet Take 10 mg by mouth daily.     Ascorbic Acid (VITAMIN C PO) Take 1 tablet by mouth daily.     aspirin  EC 81 MG tablet Take 1 tablet (81 mg total) by mouth daily. Swallow whole. 90 tablet 3   atenolol  (TENORMIN ) 100 MG tablet Take 1 tablet (100 mg total) by mouth daily. 90 tablet 3   budesonide -glycopyrrolate -formoterol (BREZTRI  AEROSPHERE) 160-9-4.8 MCG/ACT AERO  inhaler 2 samples     Cholecalciferol  (VITAMIN D -3 PO) Take 1 capsule by mouth daily.     cilostazol  (PLETAL ) 100 MG tablet Take 1 tablet (100 mg total) by mouth 2 (two) times daily. 180 tablet 3   clonazePAM  (KLONOPIN ) 0.5 MG tablet Take 0.25-0.5 mg by mouth daily as needed for anxiety.     Continuous Glucose Sensor (DEXCOM G7 SENSOR) MISC SMARTSIG:device As Directed     Cyanocobalamin  (VITAMIN B-12 PO) Take 1 tablet by mouth daily.     dextromethorphan -guaiFENesin  (MUCINEX  DM) 30-600 MG 12hr tablet Take 1 tablet by mouth 2 (two) times daily as needed for cough. 15 tablet 0   Diclofenac  Sodium 3 % GEL Apply 1 Application topically 2 (two) times daily as needed (back pain).     dicyclomine  (BENTYL ) 10 MG capsule Take 10 mg by mouth 3 (three) times daily before meals.  doxycycline  (VIBRA -TABS) 100 MG tablet Take 1 tablet (100 mg total) by mouth 2 (two) times daily. 6 tablet 0   estradiol  (ESTRACE ) 0.1 MG/GM vaginal cream Apply 3 times weekly by using a pea-sized amount applied to fingertip 42.5 g 12   Evolocumab  (REPATHA  SURECLICK) 140 MG/ML SOAJ INJECT 1 PEN INTO THE SKIN EVERY 14 (FOURTEEN) DAYS. 6 mL 3   gabapentin  (NEURONTIN ) 300 MG capsule Start: 300 mg PO qd x1 day, then 300 mg PO bid x1 day, then 300 mg PO tid Info: taper dose over >7 days to D/C 30 capsule 1   hydrOXYzine  (ATARAX ) 25 MG tablet Take 1 tablet (25 mg total) by mouth 3 (three) times daily as needed for anxiety. 90 tablet 0   insulin  aspart protamine  - aspart (NOVOLOG  70/30 MIX) (70-30) 100 UNIT/ML FlexPen Inject 40-60 Units into the skin See admin instructions. Inject 60 units into the skin before breakfast and inject 40 units before dinner.     ipratropium-albuterol  (DUONEB) 0.5-2.5 (3) MG/3ML SOLN Take 3 mLs by nebulization every 8 (eight) hours as needed (for shortness of breathing or wheezing).     levothyroxine  (SYNTHROID ) 100 MCG tablet Take 100 mcg by mouth daily before breakfast.     rosuvastatin  (CRESTOR ) 40 MG tablet  Take 1 tablet (40 mg total) by mouth daily. 90 tablet 3   sertraline  (ZOLOFT ) 50 MG tablet Take 50 mg by mouth daily.     spironolactone  (ALDACTONE ) 25 MG tablet Take 1 tablet (25 mg total) by mouth daily.     tirzepatide (MOUNJARO) 2.5 MG/0.5ML Pen Inject 2.5 mg into the skin every Monday.     nitrofurantoin , macrocrystal-monohydrate, (MACROBID ) 100 MG capsule Take 1 capsule (100 mg total) by mouth every 12 (twelve) hours for 7 days. (Patient not taking: Reported on 01/06/2024) 14 capsule 0   predniSONE  (DELTASONE ) 20 MG tablet Take 2 tablets (40 mg total) by mouth daily with breakfast. (Patient not taking: Reported on 01/06/2024) 8 tablet 0   No facility-administered medications prior to visit.    Review of Systems  Review of Systems  Constitutional: Negative.   HENT: Negative.    Respiratory:  Positive for cough and shortness of breath. Negative for wheezing.   Cardiovascular: Negative.     Physical Exam  BP 128/62   Pulse 62   Temp 98.3 F (36.8 C)   Ht 5' 5 (1.651 m) Comment: PT STATED  Wt 222 lb 12.8 oz (101.1 kg)   SpO2 94% Comment: RA  BMI 37.08 kg/m  Physical Exam Constitutional:      General: [name] is not in acute distress.    Appearance: Normal appearance. [name] is not ill-appearing or diaphoretic.  HENT:     Mouth/Throat:     Mouth: Mucous membranes are moist.     Pharynx: Oropharynx is clear.  Pulmonary:     Breath sounds: Rales present.     Comments: Fine crackles right base; hard congested cough with thick clear sputum  Musculoskeletal:        General: Normal range of motion.  Skin:    General: Skin is warm and dry.  Neurological:     General: No focal deficit present.     Mental Status: [name] is alert and oriented to person, place, and time. Mental status is at baseline.  Psychiatric:        Mood and Affect: Mood normal.        Behavior: Behavior normal.        Thought Content: Thought  content normal.        Judgment: Judgment normal.     Lab  Results:  CBC    Component Value Date/Time   WBC 6.2 10/03/2023 1410   WBC 9.5 09/21/2023 0420   RBC 5.48 (H) 10/03/2023 1410   HGB 14.9 10/03/2023 1410   HGB 14.9 01/16/2022 1050   HCT 45.0 10/03/2023 1410   HCT 47.0 (H) 01/16/2022 1050   PLT 189 10/03/2023 1410   PLT 262 01/16/2022 1050   MCV 82.1 10/03/2023 1410   MCV 85 01/16/2022 1050   MCH 27.2 10/03/2023 1410   MCHC 33.1 10/03/2023 1410   RDW 14.7 10/03/2023 1410   RDW 14.2 01/16/2022 1050   LYMPHSABS 1.1 10/03/2023 1410   MONOABS 0.5 10/03/2023 1410   EOSABS 0.5 10/03/2023 1410   BASOSABS 0.0 10/03/2023 1410    BMET    Component Value Date/Time   NA 134 (L) 10/03/2023 1410   NA 140 01/16/2022 1050   K 4.4 10/03/2023 1410   CL 101 10/03/2023 1410   CO2 27 10/03/2023 1410   GLUCOSE 201 (H) 10/03/2023 1410   BUN 10 10/03/2023 1410   BUN 7 (L) 01/16/2022 1050   CREATININE 0.92 10/03/2023 1410   CALCIUM  9.2 10/03/2023 1410   GFRNONAA >60 10/03/2023 1410   GFRAA 66 08/19/2019 1624    BNP    Component Value Date/Time   BNP 22.0 09/17/2023 0323    ProBNP No results found for: PROBNP  Imaging: No results found.   Assessment & Plan:   1. Pulmonary nodules/lesions, multiple (Primary)  2. Interstitial lung disease (HCC)  3. Chronic obstructive pulmonary disease, unspecified COPD type (HCC)   Assessment and Plan Assessment & Plan Chronic obstructive pulmonary disease (COPD), mixed type  COPD with chronic cough and mucus production, worse at night. Previous trial of Breztri  was less effective than Spiriva . Current symptoms include chronic cough and mucus production, with mucus appearing thick and brown at night. Oxygen  saturation drops with exertion but recovers with rest. - Discontinue Breztri  Aerosphere  - Prescribed Stiolto inhaler as a step-up from Spiriva . - Recommended Mucinex  for mucus thinning, once or twice daily. - Advised use of flutter valve two to three times daily, preferably after  nebulizer treatment. - Discussed potential use of a therapy percussion vest if mucus burden persists.  Bronchiectasis with recurrent exacerbations and mucus production Bronchiectasis with recurrent exacerbations and significant mucus production. Previous treatments include nebulizer and flutter valve. Current management includes mucus thinning agents and airway clearance techniques. - Advised patient use flutter valve two to three times daily. - Consider hypertonic saline or therapy percussion vest if mucus burden persists. - Will discuss potential use of Brinsupri vs daily macrolid with Dr. Byrum for recurrent exacerbations.  Pulmonary fibrosis and scarring secondary to radiation therapy Pulmonary fibrosis and scarring secondary to radiation therapy for right upper lobe lung cancer. Previous CT scan showed scarring and decreased size of pulmonary nodules. Symptoms include chronic cough and mucus production. - Continue monitoring with upcoming CT scan in February.  History of right upper lobe lung cancer, post-radiation Right upper lobe lung cancer treated with radiation therapy, completed between March and April of this year. Post-radiation symptoms include breast and arm pain, possibly due to radiation-induced changes. - Continue monitoring with upcoming CT scan in February. - Continue follow-up with Dr. Sherrod   Chronic respiratory failure requiring supplemental oxygen  Chronic respiratory failure requiring supplemental oxygen , especially with exertion. Oxygen  saturation drops with activity but recovers with rest. -  Continue supplemental oxygen  use with exertion. - Monitor oxygen  levels with exertion to maintain O2 >88-90%   FU in 6 weeks to assess response to treatment plan set forth above  I personally spent a total of 40 minutes in the care of the patient today including getting/reviewing separately obtained history, performing a medically appropriate exam/evaluation, counseling and  educating, placing orders, documenting clinical information in the EHR, independently interpreting results, communicating results, and coordinating care.   Amanda LELON Ferrari, NP 01/06/2024

## 2024-01-08 ENCOUNTER — Other Ambulatory Visit

## 2024-01-08 DIAGNOSIS — R918 Other nonspecific abnormal finding of lung field: Secondary | ICD-10-CM

## 2024-01-08 DIAGNOSIS — J849 Interstitial pulmonary disease, unspecified: Secondary | ICD-10-CM

## 2024-01-08 DIAGNOSIS — J449 Chronic obstructive pulmonary disease, unspecified: Secondary | ICD-10-CM

## 2024-01-09 LAB — RESPIRATORY CULTURE OR RESPIRATORY AND SPUTUM CULTURE: MICRO NUMBER:: 17308989

## 2024-02-03 ENCOUNTER — Other Ambulatory Visit: Payer: Self-pay

## 2024-02-04 MED ORDER — SPIRONOLACTONE 25 MG PO TABS: 25.0000 mg | ORAL_TABLET | Freq: Every day | ORAL | 2 refills | Status: AC

## 2024-02-21 ENCOUNTER — Telehealth: Payer: Self-pay | Admitting: Primary Care

## 2024-02-21 ENCOUNTER — Ambulatory Visit: Admitting: Primary Care

## 2024-02-21 ENCOUNTER — Telehealth: Admitting: Primary Care

## 2024-02-21 DIAGNOSIS — W888XXS Exposure to other ionizing radiation, sequela: Secondary | ICD-10-CM | POA: Diagnosis not present

## 2024-02-21 DIAGNOSIS — J9611 Chronic respiratory failure with hypoxia: Secondary | ICD-10-CM | POA: Diagnosis not present

## 2024-02-21 DIAGNOSIS — J449 Chronic obstructive pulmonary disease, unspecified: Secondary | ICD-10-CM | POA: Diagnosis not present

## 2024-02-21 DIAGNOSIS — D869 Sarcoidosis, unspecified: Secondary | ICD-10-CM | POA: Diagnosis not present

## 2024-02-21 DIAGNOSIS — C3411 Malignant neoplasm of upper lobe, right bronchus or lung: Secondary | ICD-10-CM | POA: Diagnosis not present

## 2024-02-21 DIAGNOSIS — J701 Chronic and other pulmonary manifestations due to radiation: Secondary | ICD-10-CM

## 2024-02-21 DIAGNOSIS — J471 Bronchiectasis with (acute) exacerbation: Secondary | ICD-10-CM | POA: Diagnosis not present

## 2024-02-21 DIAGNOSIS — J849 Interstitial pulmonary disease, unspecified: Secondary | ICD-10-CM

## 2024-02-21 MED ORDER — SODIUM CHLORIDE 3 % IN NEBU: INHALATION_SOLUTION | Freq: Two times a day (BID) | RESPIRATORY_TRACT | 1 refills | Status: AC | PRN

## 2024-02-21 NOTE — Telephone Encounter (Signed)
 Office visit scheduled & pt is aware.  Nothing further needed.

## 2024-02-21 NOTE — Progress Notes (Signed)
 Virtual Visit via Video Note  I connected with [name] on 070/16/26 at  1:30 PM EST by a video enabled telemedicine application and verified that I am speaking with the correct person using two identifiers.  Location: Patient: Home Provider: Office   I discussed the limitations of evaluation and management by telemedicine and the availability of in person appointments. The patient expressed understanding and agreed to proceed.  History of Present Illness: 69 year old female, PMH significant for CHF, CAD, HTN, TIA, lung cancer, chronic respiratory failure, COPD, ILD/bronchiectasis, sarcoidosis, hypothyroidism, type 2 diabetes, CKD, hyperlipidemia.   Previous [facility] pulmonary encounter:  01/06/2024 Discussed the use of AI scribe software for clinical note transcription with the patient, who gave verbal consent to proceed.  History of Present Illness [name] is a 69 year old female with COPD and respiratory failure who presents for a follow-up visit. She was referred by Dr. Shelah for follow-up of her pulmonary conditions.  She has chronic obstructive pulmonary disease (COPD) and respiratory failure, requiring oxygen  with exertion. She experiences persistent shortness of breath and a chronic cough with mucus production. Previously on Spiriva , she was switched to Breztri  in August but prefers Spiriva  for better symptom control. She also uses albuterol  and a nebulizer for respiratory issues.  She has a chronic cough that worsens at night, producing thick, clear-brown mucus. She uses Robitussin to manage symptoms but finds Mucinex  too expensive. She checks her oxygen  levels three to four times a day, noting a drop during physical activities such as cleaning. Her oxygen  level was 89% today on room air with exertion and recovered to 94% with rest.   She has a history of right upper lobe lung cancer and underwent radiation therapy earlier this year, resulting in scarring and pain in her breast  and right arm. The pain, described as a 'shocking feeling', began after radiation treatment. Her last CT scan in August showed a decrease in the size of her pulmonary nodules.  Follows up with Dr. Sherrod for her lung cancer. She uses a flutter valve to help with mucus clearance but has not been using it regularly. She experiences pain in her breast and arm that began after radiation therapy.   Assessment & Plan Chronic obstructive pulmonary disease (COPD), mixed type  COPD with chronic cough and mucus production, worse at night. Previous trial of Breztri  was less effective than Spiriva . Current symptoms include chronic cough and mucus production, with mucus appearing thick and brown at night. Oxygen  saturation drops with exertion but recovers with rest. - Discontinue Breztri  Aerosphere  - Prescribed Stiolto inhaler as a step-up from Spiriva . - Recommended Mucinex  for mucus thinning, once or twice daily. - Advised use of flutter valve two to three times daily, preferably after nebulizer treatment. - Discussed potential use of a therapy percussion vest if mucus burden persists.  Bronchiectasis with recurrent exacerbations and mucus production Bronchiectasis with recurrent exacerbations and significant mucus production. Previous treatments include nebulizer and flutter valve. Current management includes mucus thinning agents and airway clearance techniques. - Advised patient use flutter valve two to three times daily. - Consider hypertonic saline or therapy percussion vest if mucus burden persists. - Will discuss potential use of Brinsupri vs daily macrolid with Dr. Byrum for recurrent exacerbations.  Pulmonary fibrosis and scarring secondary to radiation therapy Pulmonary fibrosis and scarring secondary to radiation therapy for right upper lobe lung cancer. Previous CT scan showed scarring and decreased size of pulmonary nodules. Symptoms include chronic cough  and mucus production. - Continue  monitoring with upcoming CT scan in February.  History of right upper lobe lung cancer, post-radiation Right upper lobe lung cancer treated with radiation therapy, completed between March and April of this year. Post-radiation symptoms include breast and arm pain, possibly due to radiation-induced changes. - Continue monitoring with upcoming CT scan in February. - Continue follow-up with Dr. Sherrod   Chronic respiratory failure requiring supplemental oxygen  Chronic respiratory failure requiring supplemental oxygen , especially with exertion. Oxygen  saturation drops with activity but recovers with rest. - Continue supplemental oxygen  use with exertion. - Monitor oxygen  levels with exertion to maintain O2 >88-90%    02/21/2024- Interim hx  Discussed the use of AI scribe software for clinical note transcription with the patient, who gave verbal consent to proceed.  History of Present Illness [name] is a 69 year old female with COPD, bronchiectasis, and pulmonary fibrosis who presents for follow-up of her chronic respiratory conditions.  She has a history of COPD, bronchiectasis, and pulmonary fibrosis. She is on continuous supplemental oxygen  at two liters per minute and has not changed her medication regimen since December.  She experiences a chronic cough with mucus production, which is worse at night. Previously, she tried Breztri  but found it less effective than Spiriva . She has been using Stiolto, which she feels has a quicker onset and helps open her airways. Despite this, her breathing remains about the same, and she continues to have a persistent cough. She rates her cough as a five out of five, indicating she coughs all the time, and her chest is completely full of phlegm. No chest tightness, but she is very breathless when walking up a hill or a flight of stairs. She is not limited in activities at home and is confident leaving her home with oxygen . She does not sleep well due to  waking up coughing, and her energy level is moderate.  Her oxygen  saturation drops during coughing attacks but recovers quickly. At rest, her oxygen  saturation is between 94 and 97% on two liters of oxygen . She uses an albuterol  inhaler as needed.  She has been using a Flutter valve two to three times a day. Her mucus is brown in color. She has a history of lung cancer in the right upper lobe, treated with SBRT, and has an upcoming CT scan scheduled for February. Her absolute neutrophil count was 8.4 in August 2025 and normalized by the end of that month.   Observations/Objective:  Appears well without overt respiratory symptoms  Assessment and Plan: Assessment & Plan COPD mixed type with chronic respiratory failure Chronic obstructive pulmonary disease with chronic respiratory failure, requiring continuous supplemental oxygen  at 2 liters. Reports persistent cough with mucus production, worse at night, and oxygen  desaturation during coughing fits. CAT score is 23, indicating moderate symptom burden. Stiolto has been more effective than Breztri  in managing symptoms, with quicker onset and improved airway opening. - Continue Stiolto Respimat  two puffs once daily for COPD management. - Maintain continuous oxygen  therapy at 2 liters, with the option to increase to 3 liters if oxygen  saturation drops into the high 70s to low 80s and does not recover. - Monitor oxygen  saturation with a pulse oximeter at home.  Bronchiectasis with recurrent exacerbations Bronchiectasis noted on CT imaging with recurrent exacerbations, characterized by significant mucus production and brown-colored sputum. Previous discussions included the use of a Flutter valve and potential addition of hypertonic saline or Brinsupri. Brinsupri is chosen to reduce neutrophil-driven inflammation and  mucus production. Common side effects include rash, dental issues, upper respiratory symptoms, and headache. - Started Brinsupri 25mg  daily  to reduce neutrophil-driven inflammation and mucus production. - Added hypertonic saline nebulizer twice daily for pulmonary clearance, followed by Flutter valve use. - Sent prescription for Brinsupri to the pharmacist for prior authorization.  Pulmonary fibrosis and scarring secondary to radiation therapy Pulmonary fibrosis and scarring secondary to radiation therapy for right upper lobe lung cancer.  CT scan August 2025  showed scarring and decreased size of pulmonary nodules. Symptoms include chronic cough and mucus production. - Continue monitoring with upcoming CT scan in February.  Sarcoidosis Based on granulomatous disease on prior biopsies. Plan to continue surveillance imaging, treat obstructive lung disease.   History of right upper lobe lung cancer, post-radiation Right upper lobe lung cancer treated with radiation therapy, completed between March and April of this year. Post-radiation symptoms include breast and arm pain, possibly due to radiation-induced changes. - Continue monitoring with upcoming CT scan in February. - Continue follow-up with Dr. Sherrod   Follow Up Instructions:  8-12 weeks with Dr. Shelah     I discussed the assessment and treatment plan with the patient. The patient was provided an opportunity to ask questions and all were answered. The patient agreed with the plan and demonstrated an understanding of the instructions.   The patient was advised to call back or seek an in-person evaluation if the symptoms worsen or if the condition fails to improve as anticipated.  I provided 30 minutes of non-face-to-face time during this encounter.   Almarie LELON Ferrari, NP

## 2024-02-21 NOTE — Telephone Encounter (Signed)
 Needs 8-12 week follow-up in person with Dr. Byrum

## 2024-02-27 ENCOUNTER — Telehealth: Payer: Self-pay

## 2024-02-27 NOTE — Telephone Encounter (Signed)
 Received referral for new start Brinsupri. Opening benefits investigation in this thread.

## 2024-03-02 ENCOUNTER — Other Ambulatory Visit (HOSPITAL_COMMUNITY): Payer: Self-pay

## 2024-03-02 NOTE — Telephone Encounter (Signed)
 Submitted a Prior Authorization request to [employer] for BRINSUPRI via CoverMyMeds. Authorization has been APPROVED from 02/06/24 to 02/04/25. Approval letter sent to scan center.   Test Claim revealed that a 30 day supply has a copay of $12.65. Patient can fill through [facility]: [phone]  Authorization#: [id number]

## 2024-03-04 NOTE — Telephone Encounter (Signed)
 ATC patient at [phone]. Left detailed, HIPAA compliant VM requesting return call to my office number at [phone].

## 2024-03-09 ENCOUNTER — Other Ambulatory Visit: Payer: Self-pay | Admitting: Cardiovascular Disease

## 2024-03-10 MED ORDER — ATENOLOL 100 MG PO TABS: 100.0000 mg | ORAL_TABLET | Freq: Every day | ORAL | 3 refills | Status: AC

## 2024-03-23 ENCOUNTER — Other Ambulatory Visit

## 2024-03-23 ENCOUNTER — Ambulatory Visit (HOSPITAL_COMMUNITY)

## 2024-04-02 ENCOUNTER — Ambulatory Visit: Admitting: Internal Medicine

## 2024-04-07 ENCOUNTER — Ambulatory Visit: Payer: Self-pay | Admitting: Radiology

## 2024-04-22 ENCOUNTER — Ambulatory Visit: Admitting: Urology

## 2024-04-23 ENCOUNTER — Ambulatory Visit: Admitting: Emergency Medicine
